# Patient Record
Sex: Female | Born: 1945 | Race: White | Hispanic: No | Smoking: Former smoker
Health system: Southern US, Community
[De-identification: ages and names within clinical notes are randomized; demographics above are authoritative.]

## PROBLEM LIST (undated history)

## (undated) DIAGNOSIS — K2270 Barrett's esophagus without dysplasia: Secondary | ICD-10-CM

## (undated) DIAGNOSIS — J45909 Unspecified asthma, uncomplicated: Secondary | ICD-10-CM

## (undated) DIAGNOSIS — F329 Major depressive disorder, single episode, unspecified: Secondary | ICD-10-CM

## (undated) DIAGNOSIS — C3491 Malignant neoplasm of unspecified part of right bronchus or lung: Secondary | ICD-10-CM

## (undated) DIAGNOSIS — F419 Anxiety disorder, unspecified: Secondary | ICD-10-CM

## (undated) DIAGNOSIS — Z923 Personal history of irradiation: Secondary | ICD-10-CM

## (undated) DIAGNOSIS — Z8711 Personal history of peptic ulcer disease: Secondary | ICD-10-CM

## (undated) DIAGNOSIS — F32A Depression, unspecified: Secondary | ICD-10-CM

## (undated) DIAGNOSIS — F410 Panic disorder [episodic paroxysmal anxiety] without agoraphobia: Secondary | ICD-10-CM

## (undated) DIAGNOSIS — E7800 Pure hypercholesterolemia, unspecified: Secondary | ICD-10-CM

## (undated) DIAGNOSIS — J449 Chronic obstructive pulmonary disease, unspecified: Secondary | ICD-10-CM

## (undated) DIAGNOSIS — R0602 Shortness of breath: Secondary | ICD-10-CM

## (undated) DIAGNOSIS — I714 Abdominal aortic aneurysm, without rupture: Secondary | ICD-10-CM

## (undated) DIAGNOSIS — Z87891 Personal history of nicotine dependence: Secondary | ICD-10-CM

## (undated) DIAGNOSIS — G4730 Sleep apnea, unspecified: Secondary | ICD-10-CM

## (undated) DIAGNOSIS — R7981 Abnormal blood-gas level: Secondary | ICD-10-CM

## (undated) DIAGNOSIS — M8580 Other specified disorders of bone density and structure, unspecified site: Secondary | ICD-10-CM

## (undated) DIAGNOSIS — K219 Gastro-esophageal reflux disease without esophagitis: Secondary | ICD-10-CM

## (undated) DIAGNOSIS — Z9981 Dependence on supplemental oxygen: Secondary | ICD-10-CM

## (undated) HISTORY — DX: Major depressive disorder, single episode, unspecified: F32.9

## (undated) HISTORY — DX: Panic disorder (episodic paroxysmal anxiety): F41.0

## (undated) HISTORY — DX: Anxiety disorder, unspecified: F41.9

## (undated) HISTORY — DX: Depression, unspecified: F32.A

## (undated) HISTORY — DX: Other specified disorders of bone density and structure, unspecified site: M85.80

## (undated) HISTORY — PX: TUBAL LIGATION: SHX77

## (undated) HISTORY — DX: Personal history of peptic ulcer disease: Z87.11

## (undated) HISTORY — DX: Unspecified asthma, uncomplicated: J45.909

## (undated) HISTORY — PX: UPPER GI ENDOSCOPY: SHX6162

## (undated) HISTORY — DX: Barrett's esophagus without dysplasia: K22.70

## (undated) HISTORY — DX: Sleep apnea, unspecified: G47.30

## (undated) HISTORY — DX: Personal history of nicotine dependence: Z87.891

## (undated) HISTORY — DX: Gastro-esophageal reflux disease without esophagitis: K21.9

## (undated) HISTORY — DX: Chronic obstructive pulmonary disease, unspecified: J44.9

## (undated) HISTORY — DX: Shortness of breath: R06.02

## (undated) HISTORY — PX: EYE SURGERY: SHX253

## (undated) HISTORY — PX: CHOLECYSTECTOMY: SHX55

## (undated) HISTORY — DX: Malignant neoplasm of unspecified part of right bronchus or lung: C34.91

---

## 1977-05-06 HISTORY — PX: TONSILLECTOMY: SUR1361

## 1988-05-06 HISTORY — PX: CERVICAL CONE BIOPSY: SUR198

## 2003-05-07 HISTORY — PX: FRACTURE SURGERY: SHX138

## 2005-02-01 ENCOUNTER — Ambulatory Visit: Payer: Self-pay | Admitting: Family Medicine

## 2005-06-11 ENCOUNTER — Ambulatory Visit: Payer: Self-pay

## 2006-07-23 ENCOUNTER — Ambulatory Visit: Payer: Self-pay

## 2006-09-11 ENCOUNTER — Ambulatory Visit: Payer: Self-pay | Admitting: Urology

## 2006-09-27 ENCOUNTER — Emergency Department: Payer: Self-pay

## 2006-10-08 ENCOUNTER — Ambulatory Visit: Payer: Self-pay | Admitting: Unknown Physician Specialty

## 2006-10-09 ENCOUNTER — Ambulatory Visit: Payer: Self-pay | Admitting: Unknown Physician Specialty

## 2007-04-20 ENCOUNTER — Ambulatory Visit: Payer: Self-pay | Admitting: Family Medicine

## 2007-05-05 ENCOUNTER — Ambulatory Visit: Payer: Self-pay | Admitting: Gastroenterology

## 2007-05-07 HISTORY — PX: ELBOW FRACTURE SURGERY: SHX616

## 2008-04-06 ENCOUNTER — Ambulatory Visit: Payer: Self-pay | Admitting: Family Medicine

## 2008-04-21 ENCOUNTER — Ambulatory Visit: Payer: Self-pay | Admitting: Family Medicine

## 2009-01-04 ENCOUNTER — Emergency Department: Payer: Self-pay | Admitting: Emergency Medicine

## 2009-12-26 ENCOUNTER — Ambulatory Visit: Payer: Self-pay | Admitting: Family Medicine

## 2010-11-19 ENCOUNTER — Ambulatory Visit: Payer: Self-pay | Admitting: Family Medicine

## 2011-04-08 ENCOUNTER — Ambulatory Visit: Payer: Self-pay | Admitting: Family Medicine

## 2011-07-17 ENCOUNTER — Ambulatory Visit: Payer: Self-pay | Admitting: Family Medicine

## 2011-07-17 DIAGNOSIS — I059 Rheumatic mitral valve disease, unspecified: Secondary | ICD-10-CM

## 2011-08-27 ENCOUNTER — Ambulatory Visit: Payer: Self-pay | Admitting: Family Medicine

## 2011-09-17 DIAGNOSIS — J449 Chronic obstructive pulmonary disease, unspecified: Secondary | ICD-10-CM | POA: Insufficient documentation

## 2012-04-30 ENCOUNTER — Ambulatory Visit: Payer: Self-pay | Admitting: Family Medicine

## 2012-05-06 HISTORY — PX: OTHER SURGICAL HISTORY: SHX169

## 2012-07-21 ENCOUNTER — Ambulatory Visit: Payer: Self-pay | Admitting: Gastroenterology

## 2012-07-21 HISTORY — PX: COLONOSCOPY: SHX174

## 2012-07-21 LAB — HM COLONOSCOPY

## 2013-05-06 DIAGNOSIS — C3491 Malignant neoplasm of unspecified part of right bronchus or lung: Secondary | ICD-10-CM

## 2013-05-06 HISTORY — DX: Malignant neoplasm of unspecified part of right bronchus or lung: C34.91

## 2013-05-18 ENCOUNTER — Ambulatory Visit: Payer: Self-pay | Admitting: Family Medicine

## 2013-08-31 ENCOUNTER — Other Ambulatory Visit (INDEPENDENT_AMBULATORY_CARE_PROVIDER_SITE_OTHER): Payer: Medicare Other

## 2013-08-31 ENCOUNTER — Encounter (INDEPENDENT_AMBULATORY_CARE_PROVIDER_SITE_OTHER): Payer: Self-pay

## 2013-08-31 ENCOUNTER — Ambulatory Visit (INDEPENDENT_AMBULATORY_CARE_PROVIDER_SITE_OTHER): Payer: Medicare Other | Admitting: Pulmonary Disease

## 2013-08-31 ENCOUNTER — Encounter: Payer: Self-pay | Admitting: Pulmonary Disease

## 2013-08-31 ENCOUNTER — Ambulatory Visit (INDEPENDENT_AMBULATORY_CARE_PROVIDER_SITE_OTHER)
Admission: RE | Admit: 2013-08-31 | Discharge: 2013-08-31 | Disposition: A | Discharge status: Home / Self Care | Payer: Medicare Other | Source: Ambulatory Visit | Attending: Pulmonary Disease | Admitting: Pulmonary Disease

## 2013-08-31 VITALS — BP 128/70 | HR 78 | Ht 71.0 in | Wt 222.0 lb

## 2013-08-31 DIAGNOSIS — J449 Chronic obstructive pulmonary disease, unspecified: Secondary | ICD-10-CM

## 2013-08-31 DIAGNOSIS — J441 Chronic obstructive pulmonary disease with (acute) exacerbation: Secondary | ICD-10-CM

## 2013-08-31 DIAGNOSIS — G4734 Idiopathic sleep related nonobstructive alveolar hypoventilation: Secondary | ICD-10-CM | POA: Insufficient documentation

## 2013-08-31 DIAGNOSIS — R0989 Other specified symptoms and signs involving the circulatory and respiratory systems: Secondary | ICD-10-CM

## 2013-08-31 DIAGNOSIS — F17200 Nicotine dependence, unspecified, uncomplicated: Secondary | ICD-10-CM | POA: Insufficient documentation

## 2013-08-31 DIAGNOSIS — H60399 Other infective otitis externa, unspecified ear: Secondary | ICD-10-CM

## 2013-08-31 DIAGNOSIS — R0609 Other forms of dyspnea: Secondary | ICD-10-CM

## 2013-08-31 DIAGNOSIS — R0600 Dyspnea, unspecified: Secondary | ICD-10-CM

## 2013-08-31 DIAGNOSIS — E161 Other hypoglycemia: Secondary | ICD-10-CM

## 2013-08-31 LAB — CBC
HCT: 42 % (ref 36.0–46.0)
HEMOGLOBIN: 14 g/dL (ref 12.0–15.0)
MCHC: 33.3 g/dL (ref 30.0–36.0)
MCV: 91.7 fl (ref 78.0–100.0)
PLATELETS: 270 10*3/uL (ref 150.0–400.0)
RBC: 4.58 Mil/uL (ref 3.87–5.11)
RDW: 14.8 % — ABNORMAL HIGH (ref 11.5–14.6)
WBC: 10.3 10*3/uL (ref 4.5–10.5)

## 2013-08-31 NOTE — Progress Notes (Signed)
Subjective:    Patient ID: Cathy Watts, female    DOB: [birth year], 68 y.o.   MRN: [id number]  HPI  This is a very pleasant 68 year old female who comes to my clinic today for evaluation of COPD. She was previously seen by Dr. Lawernce Ion at [facility] several years ago who told her that she had moderate COPD. At that time she was started on Spiriva and Advair. As a child she said she had asthma which never required hospitalization or frequent office visits. However, about 4 years ago when she was visiting the beach with friends she started feeling increasing shortness of breath when walking on the beach. It was at this time that she was diagnosed with COPD.  She currently has shortness of breath with pretty much any activity. Washing closthes, cleaning the house, or going for walks up stairs will make her short of breath. She does have a cough productive of clear sputum. She does not have environmental triggers which make her feel more short of breath. She does not have chest pain. She has not had leg swelling.  She continues to smoke one pack of cigarettes daily. She smoked about a pack a day for 50 years. She did quit for 7 months in 2012 but unfortunately started back and has not quit smoking since then. She recently was given a prescription for Chantix which she is thinking about taking.   Past Medical History  Diagnosis Date  . Anxiety   . Asthma   . SOB (shortness of breath)   . Osteopenia   . Depression   . Panic disorder   . History of peptic ulcer disease   . Sleep apnea      Family History  Problem Relation Age of Onset  . Cancer Mother     colon  . Cancer Father     stomach  . Cancer Brother     throat  . Cancer Sister     breast     History   Social History  . Marital Status: Married    Spouse Name: N/A    Number of Children: N/A  . Years of Education: N/A   Occupational History  . Not on file.   Social History Main Topics  .  Smoking status: Current Every Day Smoker -- 1.00 packs/day for 48 years    Types: Cigarettes  . Smokeless tobacco: Never Used     Comment: used to use E-cig  . Alcohol Use: No  . Drug Use: No  . Sexual Activity: Not on file   Other Topics Concern  . Not on file   Social History Narrative  . No narrative on file     No Known Allergies   No outpatient prescriptions prior to visit.   No facility-administered medications prior to visit.      Review of Systems  Constitutional: Negative for fever and unexpected weight change.  HENT: Positive for congestion. Negative for dental problem, ear pain, nosebleeds, postnasal drip, rhinorrhea, sinus pressure, sneezing, sore throat and trouble swallowing.   Eyes: Negative for redness and itching.  Respiratory: Positive for cough, shortness of breath and wheezing. Negative for chest tightness.   Cardiovascular: Negative for palpitations and leg swelling.  Gastrointestinal: Negative for nausea and vomiting.  Genitourinary: Negative for dysuria.  Musculoskeletal: Negative for joint swelling.  Skin: Negative for rash.  Neurological: Negative for headaches.  Hematological: Does not bruise/bleed easily.  Psychiatric/Behavioral: Negative for dysphoric mood. The patient is  not nervous/anxious.        Objective:   Physical Exam  Filed Vitals:   08/31/13 1531  BP: 128/70  Pulse: 78  Height: 5\' 11"  (1.803 m)  Weight: 222 lb (100.699 kg)  SpO2: 96%  Ra  Gen: well appearing, no acute distress HEENT: NCAT, PERRL, EOMi, OP clear, neck supple without masses, left TM with some thickening versus possible fluid collection also with external air canal redness throughout. PULM: Exp wheezes noted bilaterally CV: RRR, no mgr, no JVD AB: BS+, soft, nontender, no hsm Ext: warm, no edema, no clubbing, no cyanosis Derm: no rash or skin breakdown Neuro: A&Ox4, CN II-XII intact, strength 5/5 in all 4 extremities       Assessment & Plan:   COPD,  moderate COPD: GOLD Grade C Combined recommendations from the [place], [employer] of Chest Physicians, Investment banker, corporate, European Respiratory Society (Qaseem A et al, Ann Intern Med. 2011;155(3):179) recommends tobacco cessation, pulmonary rehab (for symptomatic patients with an FEV1 < 50% predicted), supplemental oxygen (for patients with SaO2 <88% or paO2 <55), and appropriate bronchodilator therapy.  In regards to long acting bronchodilators, they recommend monotherapy (FEV1 60-80% with symptoms weak evidence, FEV1 with symptoms <60% strong evidence), or combination therapy (FEV1 <60% with symptoms, strong recommendation, moderate evidence).  One should also provide patients with annual immunizations and consider therapy for prevention of COPD exacerbations (ie. roflumilast or azithromycin) when appopriate.  -O2 therapy: Not indicated -Immunizations: Review next visit -Tobacco use: Educated at length to quit -Exercise: Pulmonary rehab referral [place] -Bronchodilator therapy: Trial of Anoro, hold Spiriva/Advair while on Anoro -Exacerbation prevention: Pulm rehab   Dyspnea I think she is profoundly deconditioned and so starting pulmonary rehabilitation would helpful. We will try a trial of Anoro to see if that makes a difference but I think that her medications for COPD her adequate right now.  We will check an H&H to make sure she's not anemic. If she does not have improvement shortness of breath after is pitting in pulmonary rehabilitation and we may want to consider a cardiac workup. Apparently she had a normal stress test recently at her doctor's office.  We will also check a chest x-ray.  Tobacco use disorder Educated at length to quit today. She is not quite ready to quit. She is interested in trying Chantix but she knows that it's not to work until she is determined to quit smoking.  Otitis, externa, infective She has what looks like a mild case of  otitis externa which she says is been present for a month. Because of the duration of her symptoms I think it would be wise for her to see [facility]. We will make a referral for that today.  Nocturnal hypoglycemia We will start 2 L nasal cannula each bedtime I agree with plans for a split night study    Updated Medication List Outpatient Encounter Prescriptions as of 08/31/2013  Medication Sig  . albuterol (PROVENTIL HFA;VENTOLIN HFA) 108 (90 BASE) MCG/ACT inhaler Inhale 2 puffs into the lungs every 6 (six) hours as needed for shortness of breath (or 30 minutes before exercize).  Marland Kitchen aspirin 81 MG tablet Take 81 mg by mouth daily.  . calcium carbonate (OS-CAL) 600 MG TABS tablet Take 600 mg by mouth 2 (two) times daily with a meal.  . citalopram (CELEXA) 40 MG tablet Take 40 mg by mouth daily.  . Fluticasone-Salmeterol (ADVAIR) 250-50 MCG/DOSE AEPB Inhale 1 puff into the  lungs 2 (two) times daily.  . Omega-3 Fatty Acids (FISH OIL) 1200 MG CAPS Take 1 capsule by mouth daily.  Marland Kitchen omeprazole (PRILOSEC) 20 MG capsule Take 20 mg by mouth daily.  . pravastatin (PRAVACHOL) 20 MG tablet Take 20 mg by mouth daily.  Marland Kitchen tiotropium (SPIRIVA) 18 MCG inhalation capsule Place 18 mcg into inhaler and inhale daily.

## 2013-08-31 NOTE — Assessment & Plan Note (Addendum)
We will start 2 L nasal cannula each bedtime I agree with plans for a split night study

## 2013-08-31 NOTE — Patient Instructions (Addendum)
QUIT SMOKING! Stop Spiriva and Advair Use the Anoro for two weeks and then call us to let us know what you think, we can prescribe it if it makes you feel better We will refer you to pulmonary rehab at [facility] We will request the records from your visit with Dr. Corrin Parker We will call you with the results from the Chest X-ray and blood from today We will refer you to [facility] for the left ear redness and itchiness We will see you back in 2-3 months or sooner if needed

## 2013-08-31 NOTE — Assessment & Plan Note (Signed)
She has what looks like a mild case of otitis externa which she says is been present for a month. Because of the duration of her symptoms I think it would be wise for her to see [facility]. We will make a referral for that today.

## 2013-08-31 NOTE — Assessment & Plan Note (Signed)
Educated at length to quit today. She is not quite ready to quit. She is interested in trying Chantix but she knows that it's not to work until she is determined to quit smoking.

## 2013-08-31 NOTE — Assessment & Plan Note (Addendum)
I think she is profoundly deconditioned and so starting pulmonary rehabilitation would helpful. We will try a trial of Anoro to see if that makes a difference but I think that her medications for COPD her adequate right now.  We will check an H&H to make sure she's not anemic. If she does not have improvement shortness of breath after is pitting in pulmonary rehabilitation and we may want to consider a cardiac workup. Apparently she had a normal stress test recently at her doctor's office.  We will also check a chest x-ray.

## 2013-08-31 NOTE — Assessment & Plan Note (Signed)
COPD: GOLD Grade C Combined recommendations from the [employer], [employer], Investment banker, corporate, [place] (Qaseem A et al, Ann Intern Med. 2011;155(3):179) recommends tobacco cessation, pulmonary rehab (for symptomatic patients with an FEV1 < 50% predicted), supplemental oxygen (for patients with SaO2 <88% or paO2 <55), and appropriate bronchodilator therapy.  In regards to long acting bronchodilators, they recommend monotherapy (FEV1 60-80% with symptoms weak evidence, FEV1 with symptoms <60% strong evidence), or combination therapy (FEV1 <60% with symptoms, strong recommendation, moderate evidence).  One should also provide patients with annual immunizations and consider therapy for prevention of COPD exacerbations (ie. roflumilast or azithromycin) when appopriate.  -O2 therapy: Not indicated -Immunizations: Review next visit -Tobacco use: Educated at length to quit -Exercise: Pulmonary rehab referral [place] -Bronchodilator therapy: Trial of Anoro, hold Spiriva/Advair while on Anoro -Exacerbation prevention: Pulm rehab

## 2013-09-02 NOTE — Progress Notes (Signed)
Quick Note:  Spoke with pt's spouse, he will have her cb when she returns home. ______

## 2013-09-09 NOTE — Progress Notes (Signed)
Quick Note:  Spoke with pt, she is aware of results. Nothing further needed at this time. ______

## 2013-09-21 ENCOUNTER — Telehealth: Payer: Self-pay | Admitting: Pulmonary Disease

## 2013-09-21 NOTE — Telephone Encounter (Signed)
[id number] x1 

## 2013-09-22 MED ORDER — UMECLIDINIUM-VILANTEROL 62.5-25 MCG/INH IN AEPB: 1.0000 | INHALATION_SPRAY | Freq: Every day | RESPIRATORY_TRACT | Status: DC

## 2013-09-22 NOTE — Telephone Encounter (Signed)
Per last OV: Use the Anoro for two weeks and then call us to let us know what you think, we can prescribe it if it makes you feel better  I spoke with the pt and she states anoro is working well for her and would like an rx sent to pharmacy. Rx sent. [place] Bing, CMA

## 2013-10-12 ENCOUNTER — Ambulatory Visit: Payer: Self-pay | Admitting: Gastroenterology

## 2013-10-14 LAB — PATHOLOGY REPORT

## 2014-04-19 ENCOUNTER — Telehealth: Payer: Self-pay | Admitting: Pulmonary Disease

## 2014-04-19 MED ORDER — UMECLIDINIUM-VILANTEROL 62.5-25 MCG/INH IN AEPB: 1.0000 | INHALATION_SPRAY | Freq: Every day | RESPIRATORY_TRACT | Status: DC

## 2014-04-19 NOTE — Telephone Encounter (Signed)
Pt aware that Rx for Anoro has been sent to pharmacy [facility] Appt scheduled for OV with BQ for 05/11/14. Nothing further needed.

## 2014-05-11 ENCOUNTER — Encounter: Payer: Self-pay | Admitting: Pulmonary Disease

## 2014-05-11 ENCOUNTER — Ambulatory Visit (INDEPENDENT_AMBULATORY_CARE_PROVIDER_SITE_OTHER): Payer: Medicare Other | Admitting: Pulmonary Disease

## 2014-05-11 VITALS — BP 132/70 | HR 81 | Ht 71.0 in | Wt 209.0 lb

## 2014-05-11 DIAGNOSIS — Z720 Tobacco use: Secondary | ICD-10-CM

## 2014-05-11 DIAGNOSIS — J449 Chronic obstructive pulmonary disease, unspecified: Secondary | ICD-10-CM

## 2014-05-11 DIAGNOSIS — F17200 Nicotine dependence, unspecified, uncomplicated: Secondary | ICD-10-CM

## 2014-05-11 NOTE — Assessment & Plan Note (Signed)
Educated at length to quit, considered various treatment modalities. She currently does not request medications to help with quitting.  We discussed the risks and benefits of undergoing lung cancer screening including the one in 4 chance that there will be an abnormality on her CTs scan. She is willing to proceed.  Plan: -Quit smoking -Screening CT scan of the lungs

## 2014-05-11 NOTE — Progress Notes (Signed)
Subjective:    Patient ID: Cathy Watts, female    DOB: Nov 28, 1945, 69 y.o.   MRN: [id number]  Synopsis: Gold Grade B with moderate airflow obstruction 2015, still smoking as of 05/2014  HPI  Chief Complaint  Patient presents with  . Follow-up    pt c/o cough with sl. wheezing, she denies chest tightness. Pt does have some nasal drainage. She has started back smoking daily. She did not complete Pulm Rehab.   Cathy Watts missed her follow up appointment because he rhusband has been so sick lately.  She has to take him to hemodialysis MWF and so this limis her ability to take care of herself.  She thinks that she has been doing OK during this time despite all this.  She says that carrying heavy objects is hard, but otherwise she has been doing OK.  She continues to take Anoro, no flares of bronchitis.    Past Medical History  Diagnosis Date  . Anxiety   . Asthma   . SOB (shortness of breath)   . Osteopenia   . Depression   . Panic disorder   . History of peptic ulcer disease   . Sleep apnea      Review of Systems     Objective:   Physical Exam Filed Vitals:   05/11/14 1528  BP: 132/70  Pulse: 81  Height: 5\' 11"  (1.803 m)  Weight: 209 lb (94.802 kg)  SpO2: 94%  RA  Gen: well appearing, no acute distress HEENT: NCAT,  EOMi, OP clear, PULM: CTA B CV: RRR, no mgr, no JVD AB: BS+, soft, nontender, Ext: warm, no edema, no clubbing, no cyanosis Derm: no rash or skin breakdown Neuro: A&Ox4, MAEW         Assessment & Plan:   COPD, moderate Despite starting smoking again, this is been a stable interval for Cathy Watts. She is quite busy taking care of her husband at home so she has been unable to pertussis a patent pulmonary rehabilitation. I explained to her today that she needs to do her best to quit smoking because of her COPD and she needs to make some effort to take care of herself with exercise.  Her flu shot is up-to-date  Plan: -Continue Anoro -I encouraged  her to exercise regularly at home -Quit smoking -Follow-up one year get a flu shot next year as well  Tobacco use disorder Educated at length to quit, considered various treatment modalities. She currently does not request medications to help with quitting.  We discussed the risks and benefits of undergoing lung cancer screening including the one in 4 chance that there will be an abnormality on her CTs scan. She is willing to proceed.  Plan: -Quit smoking -Screening CT scan of the lungs    Updated Medication List Outpatient Encounter Prescriptions as of 05/11/2014  Medication Sig  . albuterol (PROVENTIL HFA;VENTOLIN HFA) 108 (90 BASE) MCG/ACT inhaler Inhale 2 puffs into the lungs every 6 (six) hours as needed for shortness of breath (or 30 minutes before exercize).  Marland Kitchen aspirin 81 MG tablet Take 81 mg by mouth daily.  . calcium carbonate (OS-CAL) 600 MG TABS tablet Take 600 mg by mouth daily with breakfast.   . citalopram (CELEXA) 40 MG tablet Take 20 mg by mouth daily.   . Omega-3 Fatty Acids (FISH OIL) 1200 MG CAPS Take 1 capsule by mouth daily.  Marland Kitchen omeprazole (PRILOSEC) 20 MG capsule Take 20 mg by mouth daily.  . polyethylene glycol powder (  GLYCOLAX/MIRALAX) powder Take 17 g by mouth daily.  . pravastatin (PRAVACHOL) 20 MG tablet Take 20 mg by mouth daily.  Marland Kitchen Umeclidinium-Vilanterol (ANORO ELLIPTA) 62.5-25 MCG/INH AEPB Inhale 1 puff into the lungs daily.

## 2014-05-11 NOTE — Assessment & Plan Note (Signed)
Despite starting smoking again, this is been a stable interval for Cathy Watts. She is quite busy taking care of her husband at home so she has been unable to pertussis a patent pulmonary rehabilitation. I explained to her today that she needs to do her best to quit smoking because of her COPD and she needs to make some effort to take care of herself with exercise.  Her flu shot is up-to-date  Plan: -Continue Anoro -I encouraged her to exercise regularly at home -Quit smoking -Follow-up one year get a flu shot next year as well

## 2014-05-11 NOTE — Patient Instructions (Signed)
We will set up the lung cancer screening CT scan Keep taking the Anoro Quit smoking Exercise regularly  We will see you back in one year or sooner if needed

## 2014-06-02 ENCOUNTER — Ambulatory Visit: Payer: Self-pay | Admitting: Family Medicine

## 2014-07-07 ENCOUNTER — Ambulatory Visit
Admit: 2014-07-07 | Disposition: A | Discharge status: Home / Self Care | Payer: Self-pay | Attending: Family Medicine | Admitting: Family Medicine

## 2014-07-08 ENCOUNTER — Ambulatory Visit: Payer: Self-pay | Admitting: Family Medicine

## 2014-07-12 ENCOUNTER — Telehealth: Payer: Self-pay | Admitting: Pulmonary Disease

## 2014-07-12 MED ORDER — LEVOFLOXACIN 500 MG PO TABS: 500.0000 mg | ORAL_TABLET | Freq: Every day | ORAL | Status: AC

## 2014-07-12 NOTE — Telephone Encounter (Signed)
I called Cathy Watts to let her know that the screening CT scan showed right lower lobe pneumonia. She says she has been coughing with some mild dyspnea recently. She denies fevers or chills and she says that she feels like she's improving somewhat.  I prescribed Levaquin 500 mg by mouth daily 5 days and asked that she pick this up. She voiced understanding.  We will repeat a screening CT chest in 3 months.

## 2014-07-12 NOTE — Telephone Encounter (Signed)
Spoke with Magda Paganini at [facility] CT, she wanted to make sure that BQ looked at pt's lung ca screening ct.  I advised her that I would have BQ review this today.  Nothing further needed at this time.

## 2014-07-12 NOTE — Addendum Note (Signed)
Addended by: Simonne Maffucci B on: 07/12/2014 03:21 PM   Modules accepted: Orders

## 2014-08-02 ENCOUNTER — Other Ambulatory Visit: Payer: Self-pay

## 2014-08-02 ENCOUNTER — Telehealth: Payer: Self-pay | Admitting: Pulmonary Disease

## 2014-08-02 MED ORDER — UMECLIDINIUM-VILANTEROL 62.5-25 MCG/INH IN AEPB: 1.0000 | INHALATION_SPRAY | Freq: Every day | RESPIRATORY_TRACT | Status: DC

## 2014-08-02 NOTE — Telephone Encounter (Signed)
Pt is aware that her prescription has already been sent in. Nothing further was needed.

## 2014-08-08 DIAGNOSIS — J449 Chronic obstructive pulmonary disease, unspecified: Secondary | ICD-10-CM | POA: Diagnosis not present

## 2014-08-17 DIAGNOSIS — F329 Major depressive disorder, single episode, unspecified: Secondary | ICD-10-CM | POA: Diagnosis not present

## 2014-08-17 DIAGNOSIS — F419 Anxiety disorder, unspecified: Secondary | ICD-10-CM | POA: Diagnosis not present

## 2014-08-17 DIAGNOSIS — J449 Chronic obstructive pulmonary disease, unspecified: Secondary | ICD-10-CM | POA: Diagnosis not present

## 2014-09-07 DIAGNOSIS — J449 Chronic obstructive pulmonary disease, unspecified: Secondary | ICD-10-CM | POA: Diagnosis not present

## 2014-09-27 ENCOUNTER — Ambulatory Visit
Admission: RE | Admit: 2014-09-27 | Discharge: 2014-09-27 | Disposition: A | Discharge status: Home / Self Care | Payer: Medicare Other | Source: Ambulatory Visit | Attending: Family Medicine | Admitting: Family Medicine

## 2014-09-27 ENCOUNTER — Other Ambulatory Visit: Payer: Self-pay | Admitting: Family Medicine

## 2014-09-27 DIAGNOSIS — J449 Chronic obstructive pulmonary disease, unspecified: Secondary | ICD-10-CM | POA: Diagnosis not present

## 2014-09-27 DIAGNOSIS — J189 Pneumonia, unspecified organism: Secondary | ICD-10-CM | POA: Diagnosis not present

## 2014-09-27 DIAGNOSIS — F419 Anxiety disorder, unspecified: Secondary | ICD-10-CM | POA: Diagnosis not present

## 2014-09-27 DIAGNOSIS — R05 Cough: Secondary | ICD-10-CM | POA: Diagnosis not present

## 2014-10-19 ENCOUNTER — Other Ambulatory Visit: Payer: Self-pay | Admitting: Family Medicine

## 2014-10-19 DIAGNOSIS — K219 Gastro-esophageal reflux disease without esophagitis: Secondary | ICD-10-CM | POA: Insufficient documentation

## 2014-10-19 DIAGNOSIS — K2270 Barrett's esophagus without dysplasia: Secondary | ICD-10-CM | POA: Insufficient documentation

## 2014-10-19 DIAGNOSIS — E7800 Pure hypercholesterolemia, unspecified: Secondary | ICD-10-CM | POA: Insufficient documentation

## 2014-10-19 MED ORDER — PRAVASTATIN SODIUM 20 MG PO TABS: 20.0000 mg | ORAL_TABLET | Freq: Every day | ORAL | Status: DC

## 2014-10-19 NOTE — Telephone Encounter (Signed)
Last refill 07/21/2014. LOV 09/27/2014. Last lipids checked 04/12/2014. Renaldo Fiddler, CMA

## 2014-10-19 NOTE — Telephone Encounter (Signed)
Pt contacted office for refill request on the following medications:  pravastatin (PRAVACHOL) 20 MG tablet.  [place].  OE#[phone]/[id number]

## 2014-11-15 DIAGNOSIS — Z8711 Personal history of peptic ulcer disease: Secondary | ICD-10-CM | POA: Insufficient documentation

## 2014-11-15 DIAGNOSIS — R0683 Snoring: Secondary | ICD-10-CM | POA: Insufficient documentation

## 2014-11-15 DIAGNOSIS — R609 Edema, unspecified: Secondary | ICD-10-CM | POA: Insufficient documentation

## 2014-11-15 DIAGNOSIS — R0602 Shortness of breath: Secondary | ICD-10-CM | POA: Insufficient documentation

## 2014-11-15 DIAGNOSIS — K5790 Diverticulosis of intestine, part unspecified, without perforation or abscess without bleeding: Secondary | ICD-10-CM | POA: Insufficient documentation

## 2014-11-15 DIAGNOSIS — F410 Panic disorder [episodic paroxysmal anxiety] without agoraphobia: Secondary | ICD-10-CM | POA: Insufficient documentation

## 2014-11-15 DIAGNOSIS — F419 Anxiety disorder, unspecified: Secondary | ICD-10-CM | POA: Insufficient documentation

## 2014-11-15 DIAGNOSIS — J441 Chronic obstructive pulmonary disease with (acute) exacerbation: Secondary | ICD-10-CM | POA: Insufficient documentation

## 2014-11-15 DIAGNOSIS — M8580 Other specified disorders of bone density and structure, unspecified site: Secondary | ICD-10-CM | POA: Insufficient documentation

## 2014-11-15 DIAGNOSIS — J45909 Unspecified asthma, uncomplicated: Secondary | ICD-10-CM | POA: Insufficient documentation

## 2014-11-15 DIAGNOSIS — F17200 Nicotine dependence, unspecified, uncomplicated: Secondary | ICD-10-CM | POA: Insufficient documentation

## 2014-11-15 DIAGNOSIS — E559 Vitamin D deficiency, unspecified: Secondary | ICD-10-CM | POA: Insufficient documentation

## 2014-11-15 DIAGNOSIS — R002 Palpitations: Secondary | ICD-10-CM | POA: Insufficient documentation

## 2014-11-15 DIAGNOSIS — F32A Depression, unspecified: Secondary | ICD-10-CM | POA: Insufficient documentation

## 2014-11-15 DIAGNOSIS — G4730 Sleep apnea, unspecified: Secondary | ICD-10-CM | POA: Insufficient documentation

## 2014-11-15 DIAGNOSIS — F329 Major depressive disorder, single episode, unspecified: Secondary | ICD-10-CM | POA: Insufficient documentation

## 2014-11-15 DIAGNOSIS — R400 Somnolence: Secondary | ICD-10-CM | POA: Insufficient documentation

## 2014-11-15 DIAGNOSIS — K5900 Constipation, unspecified: Secondary | ICD-10-CM | POA: Insufficient documentation

## 2014-11-16 ENCOUNTER — Encounter: Payer: Self-pay | Admitting: Physician Assistant

## 2014-11-16 ENCOUNTER — Ambulatory Visit
Admission: RE | Admit: 2014-11-16 | Discharge: 2014-11-16 | Disposition: A | Discharge status: Home / Self Care | Payer: Medicare Other | Source: Ambulatory Visit | Attending: Physician Assistant | Admitting: Physician Assistant

## 2014-11-16 ENCOUNTER — Ambulatory Visit (INDEPENDENT_AMBULATORY_CARE_PROVIDER_SITE_OTHER): Payer: Medicare Other | Admitting: Physician Assistant

## 2014-11-16 VITALS — BP 118/70 | HR 76 | Resp 16 | Wt 190.8 lb

## 2014-11-16 DIAGNOSIS — R0989 Other specified symptoms and signs involving the circulatory and respiratory systems: Secondary | ICD-10-CM | POA: Diagnosis not present

## 2014-11-16 DIAGNOSIS — J441 Chronic obstructive pulmonary disease with (acute) exacerbation: Secondary | ICD-10-CM

## 2014-11-16 DIAGNOSIS — J449 Chronic obstructive pulmonary disease, unspecified: Secondary | ICD-10-CM | POA: Insufficient documentation

## 2014-11-16 DIAGNOSIS — R05 Cough: Secondary | ICD-10-CM | POA: Diagnosis present

## 2014-11-16 MED ORDER — LEVOFLOXACIN 500 MG PO TABS: 500.0000 mg | ORAL_TABLET | Freq: Every day | ORAL | Status: DC

## 2014-11-16 NOTE — Progress Notes (Signed)
   Subjective:    Patient ID: Cathy Watts, female    DOB: [birth year], 69 y.o.   MRN: [id number]  Cough This is a chronic problem. The current episode started in the past 7 days. The problem has been gradually improving. The problem occurs hourly. The cough is productive of purulent sputum (thick). Associated symptoms include nasal congestion, postnasal drip, rhinorrhea, shortness of breath (at baseline), sweats (once) and wheezing (at baseline). Pertinent negatives include no chest pain, chills, ear congestion, ear pain, fever, headaches, heartburn, hemoptysis, myalgias, rash, sore throat or weight loss. The symptoms are aggravated by lying down (humid air). Risk factors for lung disease include smoking/tobacco exposure. She has tried steroid inhaler, prescription cough suppressant, rest and ipratropium inhaler for the symptoms. The treatment provided moderate relief. Her past medical history is significant for asthma, bronchitis, COPD, emphysema and pneumonia. There is no history of bronchiectasis or environmental allergies.      Review of Systems  Constitutional: Negative for fever, chills and weight loss.  HENT: Positive for postnasal drip and rhinorrhea. Negative for ear pain and sore throat.   Respiratory: Positive for cough, shortness of breath (at baseline) and wheezing (at baseline). Negative for hemoptysis and chest tightness.   Cardiovascular: Negative for chest pain, palpitations and leg swelling.  Gastrointestinal: Negative for heartburn.  Musculoskeletal: Negative for myalgias.  Skin: Negative for rash.  Allergic/Immunologic: Negative for environmental allergies.  Neurological: Negative for dizziness, weakness and headaches.       Objective:   Physical Exam  Constitutional: She appears well-developed and well-nourished. No distress.  HENT:  Head: Normocephalic and atraumatic.  Right Ear: Hearing, tympanic membrane, external ear and ear canal normal.  Left Ear:  Hearing, tympanic membrane, external ear and ear canal normal.  Nose: Nose normal.  Mouth/Throat: Uvula is midline, oropharynx is clear and moist and mucous membranes are normal. No oropharyngeal exudate.  Cardiovascular: Normal rate, regular rhythm and normal heart sounds.  Exam reveals no gallop and no friction rub.   No murmur heard. Pulmonary/Chest: Effort normal. No respiratory distress. She has no decreased breath sounds. She has wheezes (right and left mid and lower lung fields). She has no rhonchi. She has rales in the right lower field. She exhibits no tenderness.  Lymphadenopathy:    She has no cervical adenopathy.  Skin: She is not diaphoretic.  Vitals reviewed.         Assessment & Plan:  1. COPD, moderate Will obtain CXR to make sure crackles in RLL is not new pneumonia.  Previously had pneumonia in 07/2014 seen on chest CT.  Advised to call Dr. Anastasia Pall office as she was to have a f/u CT in 3 months, which would have been June 2016, and she has not yet been scheduled for this. - DG Chest 2 View; Future  2. Acute exacerbation of chronic obstructive airways disease States she is improving.  Last week was worst.  Due to abnormal lung sounds in RLL will treat with levaquin as below.  CXR ordered.  She is to call if symptoms do not improve or worsen. - levofloxacin (LEVAQUIN) 500 MG tablet; Take 1 tablet (500 mg total) by mouth daily.  Dispense: 10 tablet; Refill: 0 - DG Chest 2 View; Future

## 2014-11-16 NOTE — Patient Instructions (Signed)

## 2014-11-17 ENCOUNTER — Telehealth: Payer: Self-pay

## 2014-11-17 NOTE — Telephone Encounter (Signed)
LMTCB

## 2014-11-17 NOTE — Telephone Encounter (Signed)
Patient advised as directed below. Patient verbalized understanding.  

## 2014-11-17 NOTE — Telephone Encounter (Signed)
-----   Message from Mar Daring, [place] sent at 11/16/2014  5:30 PM EDT ----- CXR shows chronic COPD but no active disease.

## 2015-01-06 ENCOUNTER — Telehealth: Payer: Self-pay

## 2015-01-06 NOTE — Telephone Encounter (Signed)
Navigator Encounter Type: telephone, Screening  Notified patient that annual lung cancer screening low dose CT scan is due. Confirmed that patient is within the age range 55-77, asymptomatic. The patient is a smoker with a 51 pkyear history. Shared Decision Making Visit was done 07/08/2014. Patient is agreeable to scheduling of CT scan.

## 2015-01-16 ENCOUNTER — Other Ambulatory Visit: Payer: Self-pay | Admitting: Family Medicine

## 2015-01-16 ENCOUNTER — Encounter: Payer: Self-pay | Admitting: Family Medicine

## 2015-01-16 DIAGNOSIS — Z87891 Personal history of nicotine dependence: Secondary | ICD-10-CM

## 2015-01-16 HISTORY — DX: Personal history of nicotine dependence: Z87.891

## 2015-01-17 ENCOUNTER — Other Ambulatory Visit: Payer: Self-pay | Admitting: Family Medicine

## 2015-01-17 DIAGNOSIS — R918 Other nonspecific abnormal finding of lung field: Secondary | ICD-10-CM

## 2015-01-18 ENCOUNTER — Ambulatory Visit
Admission: RE | Admit: 2015-01-18 | Discharge: 2015-01-18 | Disposition: A | Discharge status: Home / Self Care | Payer: Medicare Other | Source: Ambulatory Visit | Attending: Family Medicine | Admitting: Family Medicine

## 2015-01-18 DIAGNOSIS — J189 Pneumonia, unspecified organism: Secondary | ICD-10-CM | POA: Diagnosis not present

## 2015-01-18 DIAGNOSIS — J219 Acute bronchiolitis, unspecified: Secondary | ICD-10-CM | POA: Insufficient documentation

## 2015-01-18 DIAGNOSIS — R918 Other nonspecific abnormal finding of lung field: Secondary | ICD-10-CM | POA: Insufficient documentation

## 2015-01-19 ENCOUNTER — Other Ambulatory Visit: Payer: Self-pay | Admitting: Family Medicine

## 2015-01-19 DIAGNOSIS — R918 Other nonspecific abnormal finding of lung field: Secondary | ICD-10-CM

## 2015-01-19 DIAGNOSIS — Z87891 Personal history of nicotine dependence: Secondary | ICD-10-CM

## 2015-01-20 ENCOUNTER — Ambulatory Visit: Payer: Medicare Other | Admitting: Family Medicine

## 2015-01-27 ENCOUNTER — Ambulatory Visit
Admission: RE | Admit: 2015-01-27 | Discharge: 2015-01-27 | Disposition: A | Discharge status: Home / Self Care | Payer: Medicare Other | Source: Ambulatory Visit | Attending: Family Medicine | Admitting: Family Medicine

## 2015-01-27 DIAGNOSIS — I2510 Atherosclerotic heart disease of native coronary artery without angina pectoris: Secondary | ICD-10-CM | POA: Diagnosis not present

## 2015-01-27 DIAGNOSIS — R590 Localized enlarged lymph nodes: Secondary | ICD-10-CM | POA: Insufficient documentation

## 2015-01-27 DIAGNOSIS — R911 Solitary pulmonary nodule: Secondary | ICD-10-CM | POA: Diagnosis not present

## 2015-01-27 DIAGNOSIS — J219 Acute bronchiolitis, unspecified: Secondary | ICD-10-CM | POA: Diagnosis not present

## 2015-01-27 DIAGNOSIS — R918 Other nonspecific abnormal finding of lung field: Secondary | ICD-10-CM | POA: Diagnosis present

## 2015-01-27 DIAGNOSIS — J439 Emphysema, unspecified: Secondary | ICD-10-CM | POA: Insufficient documentation

## 2015-01-27 MED ORDER — IOHEXOL 300 MG/ML  SOLN
75.0000 mL | Freq: Once | INTRAMUSCULAR | Status: AC | PRN
  Administered 2015-01-27: 75 mL via INTRAVENOUS

## 2015-01-30 ENCOUNTER — Encounter: Payer: Self-pay | Admitting: *Deleted

## 2015-01-30 ENCOUNTER — Encounter: Payer: Self-pay | Admitting: Cardiothoracic Surgery

## 2015-01-30 ENCOUNTER — Inpatient Hospital Stay: Payer: Medicare Other | Attending: Cardiothoracic Surgery | Admitting: Cardiothoracic Surgery

## 2015-01-30 VITALS — BP 146/87 | HR 60 | Temp 96.8°F | Resp 19 | Ht 71.0 in | Wt 181.5 lb

## 2015-01-30 DIAGNOSIS — R918 Other nonspecific abnormal finding of lung field: Secondary | ICD-10-CM | POA: Diagnosis not present

## 2015-01-30 NOTE — Progress Notes (Signed)
  Oncology Nurse Navigator Documentation    Navigator Encounter Type: Clinic/MDC (01/30/15 1100) Patient Visit Type: Surgery (01/30/15 1100) Treatment Phase: Abnormal Scans (01/30/15 1100)                  Time Spent with Patient: 30 (01/30/15 1100)   Met with patient at thoracic surgery appointment. Introduced Programmer, multimedia and reviewed plan of care. Will follow and assist with coordination of obtaining pfts and ebus. Patient verbalizes preference for having appointments here in [place] if possible.

## 2015-01-30 NOTE — Progress Notes (Signed)
Patient ID: Cathy Watts, female   DOB: 09-Jun-1945, 69 y.o.   MRN: [id number]  Chief Complaint  Patient presents with  . New Evaluation    CT scan results    Referred By Dr. Venia Minks Reason for Referral right hilar mass  HPI Location, Quality, Duration, Severity, Timing, Context, Modifying Factors, Associated Signs and Symptoms.  Cathy Watts is a 69 y.o. female.  I have personally seen and examined this patient. She is a 69 year old white female who was originally diagnosed with COPD several years ago. She is currently treated with oxygen therapy at night. She had an episode of pneumonia in March of this year and a CT scan showed a right lower lobe pneumonia. 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 She then had a CT scan with contrast again revealing a right lower lobe mass with postobstructive pneumonitis. There is also some hilar adenopathy. The patient is a lifelong smoker. She smokes at least a pack cigarettes a day and has done so almost all her life. She states that she does get short of breath. She is also had a cough but no hemoptysis. She's had about a 50 pound weight loss which has been intentional.   Past Medical History  Diagnosis Date  . Anxiety   . Asthma   . SOB (shortness of breath)   . Osteopenia   . Depression   . Panic disorder   . History of peptic ulcer disease   . Sleep apnea   . Personal history of tobacco use, presenting hazards to health 01/16/2015    Past Surgical History  Procedure Laterality Date  . Tubal ligation  1970's  . Cholecystectomy  1980's  . Elbow fracture surgery Right 2009  . Tonsillectomy  1979  . Cervical cone biopsy  1990    Family History  Problem Relation Age of Onset  . Cancer Mother     colon  . Diabetes Mother   . Arthritis Mother   . Glaucoma Mother   . Cancer Father     stomach  . Cancer Brother     throat   . Cancer Sister     breast  . Diabetes Sister   . Cirrhosis Sister   . Cancer Paternal Aunt   . Parkinson's disease Sister   . Heart attack Sister   . Ovarian cancer Sister     Social History Social History  Substance Use Topics  . Smoking status: Current Every Day Smoker -- 1.00 packs/day for 48 years    Types: Cigarettes  . Smokeless tobacco: Never Used     Comment: used to use E-cig  . Alcohol Use: No    No Known Allergies  Current Outpatient Prescriptions  Medication Sig Dispense Refill  . albuterol (PROVENTIL HFA;VENTOLIN HFA) 108 (90 BASE) MCG/ACT inhaler Inhale 2 puffs into the lungs every 6 (six) hours as needed for shortness of breath (or 30 minutes before exercize).    Marland Kitchen ALPRAZolam (XANAX) 0.5 MG tablet Take 1 tablet by mouth every 8 (eight) hours as needed.    Marland Kitchen aspirin 81 MG tablet Take 81 mg by mouth daily.    . busPIRone (BUSPAR) 30 MG tablet Take 1 tablet by mouth 2 (two) times daily.    Marland Kitchen CALCIUM CARBONATE-VIT D-MIN PO Take 1 tablet by mouth 3 (three) times daily.    . clotrimazole-betamethasone (LOTRISONE) cream LOTRISONE, 1-0.05% (External Cream)  1 (one) Cream Cream  Apply twice daily to affected area.  Not for Internal use.  (Correction from prev script said not for external use and should read , not for internal use) for 0 days  Quantity: 45;  Refills: 1   Ordered :22-Apr-2013  Renaldo Fiddler ;  Started 22-Apr-2013 Active    . escitalopram (LEXAPRO) 20 MG tablet Take 1 tablet by mouth daily.    . Omega-3 Fatty Acids (FISH OIL) 1200 MG CAPS Take 1 capsule by mouth daily.    Marland Kitchen omeprazole (PRILOSEC) 20 MG capsule Take 20 mg by mouth daily.    . polyethylene glycol powder (GLYCOLAX/MIRALAX) powder Take 17 g by mouth daily.    . pravastatin (PRAVACHOL) 20 MG tablet Take 1 tablet (20 mg total) by mouth at bedtime. 90 tablet 3  . Umeclidinium-Vilanterol (ANORO ELLIPTA) 62.5-25 MCG/INH AEPB Inhale 1 puff into the lungs daily. 60 each 6   No current  facility-administered medications for this visit.      Review of Systems A complete review of systems was asked and was negative except for the following positive findings weight loss, cough, shortness of breath, wheezing, heartburn, frequent urination, depression, anxiety, excessive thirst.  Blood pressure 146/87, pulse 60, temperature 96.8 F (36 C), temperature source Tympanic, resp. rate 19, height '5\' 11"'$  (1.803 m), weight 181 lb 8.8 oz (82.35 kg), SpO2 99 %.  Physical Exam CONSTITUTIONAL:  Pleasant, well-developed, well-nourished, and in no acute distress. EYES: Pupils equal and reactive to light, Sclera non-icteric EARS, NOSE, MOUTH AND THROAT:  The oropharynx was clear.  Dentition is good repair.  Oral mucosa pink and moist. LYMPH NODES:  Lymph nodes in the neck and axillae were normal RESPIRATORY:  Lungs were clear.  Normal respiratory effort without pathologic use of accessory muscles of respiration CARDIOVASCULAR: Heart was regular without murmurs.  There were no carotid bruits. GI: The abdomen was soft, nontender, and nondistended. There were no palpable masses. There was no hepatosplenomegaly. There were normal bowel sounds in all quadrants. GU:  Rectal deferred.   MUSCULOSKELETAL:  Normal muscle strength and tone.  No clubbing or cyanosis.   SKIN:  There were no pathologic skin lesions.  There were no nodules on palpation. NEUROLOGIC:  Sensation is normal.  Cranial nerves are grossly intact. PSYCH:  Oriented to person, place and time.  Mood and affect are normal.  Data Reviewed I have independently reviewed the patient's CT scans.  I have personally reviewed the patient's imaging, laboratory findings and medical records.    Assessment    There is a right lower lobe mass which has some associated hilar adenopathy. The pneumonic process in the right lower lobe is improved but there is still essentially obstructing tumor. I believe that this is most likely a malignant lesion  in the right lower lobe.    Plan    I would like patient to follow-up with Dr. Lake Bells. He had previously seen the patient diagnosed COPD. We'll also get some pulmonary function studies and a PET scan and I will follow-up with the patient next week.      Nestor Lewandowsky, MD 01/30/2015, 11:10 AM

## 2015-01-30 NOTE — Progress Notes (Signed)
Sob on exertion and urinary urgency.  No other complaints noted

## 2015-01-31 ENCOUNTER — Other Ambulatory Visit: Payer: Self-pay | Admitting: Cardiothoracic Surgery

## 2015-01-31 DIAGNOSIS — R918 Other nonspecific abnormal finding of lung field: Secondary | ICD-10-CM

## 2015-02-06 ENCOUNTER — Encounter
Admission: RE | Admit: 2015-02-06 | Discharge: 2015-02-06 | Disposition: A | Discharge status: Home / Self Care | Payer: Medicare Other | Source: Ambulatory Visit | Attending: Internal Medicine | Admitting: Internal Medicine

## 2015-02-06 DIAGNOSIS — Z83511 Family history of glaucoma: Secondary | ICD-10-CM | POA: Diagnosis not present

## 2015-02-06 DIAGNOSIS — F17210 Nicotine dependence, cigarettes, uncomplicated: Secondary | ICD-10-CM | POA: Diagnosis not present

## 2015-02-06 DIAGNOSIS — R911 Solitary pulmonary nodule: Secondary | ICD-10-CM | POA: Diagnosis present

## 2015-02-06 DIAGNOSIS — F410 Panic disorder [episodic paroxysmal anxiety] without agoraphobia: Secondary | ICD-10-CM | POA: Diagnosis not present

## 2015-02-06 DIAGNOSIS — Z8041 Family history of malignant neoplasm of ovary: Secondary | ICD-10-CM | POA: Diagnosis not present

## 2015-02-06 DIAGNOSIS — Z7982 Long term (current) use of aspirin: Secondary | ICD-10-CM | POA: Diagnosis not present

## 2015-02-06 DIAGNOSIS — Z800 Family history of malignant neoplasm of digestive organs: Secondary | ICD-10-CM | POA: Diagnosis not present

## 2015-02-06 DIAGNOSIS — Z8711 Personal history of peptic ulcer disease: Secondary | ICD-10-CM | POA: Diagnosis not present

## 2015-02-06 DIAGNOSIS — F329 Major depressive disorder, single episode, unspecified: Secondary | ICD-10-CM | POA: Diagnosis not present

## 2015-02-06 DIAGNOSIS — Z79899 Other long term (current) drug therapy: Secondary | ICD-10-CM | POA: Diagnosis not present

## 2015-02-06 DIAGNOSIS — F419 Anxiety disorder, unspecified: Secondary | ICD-10-CM | POA: Diagnosis not present

## 2015-02-06 DIAGNOSIS — C3431 Malignant neoplasm of lower lobe, right bronchus or lung: Secondary | ICD-10-CM | POA: Diagnosis not present

## 2015-02-06 DIAGNOSIS — M8580 Other specified disorders of bone density and structure, unspecified site: Secondary | ICD-10-CM | POA: Diagnosis not present

## 2015-02-06 DIAGNOSIS — Z833 Family history of diabetes mellitus: Secondary | ICD-10-CM | POA: Diagnosis not present

## 2015-02-06 DIAGNOSIS — G4730 Sleep apnea, unspecified: Secondary | ICD-10-CM | POA: Diagnosis not present

## 2015-02-06 DIAGNOSIS — Z803 Family history of malignant neoplasm of breast: Secondary | ICD-10-CM | POA: Diagnosis not present

## 2015-02-06 DIAGNOSIS — Z8261 Family history of arthritis: Secondary | ICD-10-CM | POA: Diagnosis not present

## 2015-02-06 DIAGNOSIS — Z820 Family history of epilepsy and other diseases of the nervous system: Secondary | ICD-10-CM | POA: Diagnosis not present

## 2015-02-06 DIAGNOSIS — J45909 Unspecified asthma, uncomplicated: Secondary | ICD-10-CM | POA: Diagnosis not present

## 2015-02-06 HISTORY — DX: Abnormal blood-gas level: R79.81

## 2015-02-06 LAB — BASIC METABOLIC PANEL
ANION GAP: 7 (ref 5–15)
BUN: 13 mg/dL (ref 6–20)
CALCIUM: 9.2 mg/dL (ref 8.9–10.3)
CO2: 27 mmol/L (ref 22–32)
Chloride: 103 mmol/L (ref 101–111)
Creatinine, Ser: 0.63 mg/dL (ref 0.44–1.00)
GFR calc Af Amer: >60 mL/min (ref >60)
GLUCOSE: 92 mg/dL (ref 65–99)
Potassium: 4.4 mmol/L (ref 3.5–5.1)
SODIUM: 137 mmol/L (ref 135–145)

## 2015-02-06 LAB — CBC
HCT: 44.3 % (ref 35.0–47.0)
Hemoglobin: 14.8 g/dL (ref 12.0–16.0)
MCH: 30.9 pg (ref 26.0–34.0)
MCHC: 33.4 g/dL (ref 32.0–36.0)
MCV: 92.3 fL (ref 80.0–100.0)
PLATELETS: 219 10*3/uL (ref 150–440)
RBC: 4.8 MIL/uL (ref 3.80–5.20)
RDW: 14.4 % (ref 11.5–14.5)
WBC: 8.4 10*3/uL (ref 3.6–11.0)

## 2015-02-06 NOTE — Patient Instructions (Signed)
  Your procedure is scheduled on: 02/07/15 Report to Day Surgery. To find out your arrival time please call [phone] between 1PM - 3PM on 02/06/15.  Remember: Instructions that are not followed completely may result in serious medical risk, up to and including death, or upon the discretion of your surgeon and anesthesiologist your surgery may need to be rescheduled.    _x___ 1. Do not eat food or drink liquids after midnight. No gum chewing or hard candies.     __x__ 2. No Alcohol for 24 hours before or after surgery.   ____ 3. Bring all medications with you on the day of surgery if instructed.    __x__ 4. Notify your doctor if there is any change in your medical condition     (cold, fever, infections).     Do not wear jewelry, make-up, hairpins, clips or nail polish.  Do not wear lotions, powders, or perfumes. You may wear deodorant.  Do not shave 48 hours prior to surgery. Men may shave face and neck.  Do not bring valuables to the hospital.    [facility] is not responsible for any belongings or valuables.               Contacts, dentures or bridgework may not be worn into surgery.  Leave your suitcase in the car. After surgery it may be brought to your room.  For patients admitted to the hospital, discharge time is determined by your                treatment team.   Patients discharged the day of surgery will not be allowed to drive home.   Please read over the following fact sheets that you were given:   Surgical Site Infection Prevention   ____ Take these medicines the morning of surgery with A SIP OF WATER:    1. lexapro  2. omeprazole  3. buspar  4.  5.  6.  ____ Fleet Enema (as directed)   ____ Use CHG Soap as directed  _x___ Use inhalers on the day of surgery  ____ Stop metformin 2 days prior to surgery    ____ Take 1/2 of usual insulin dose the night before surgery and none on the morning of surgery.   ____ Stop Coumadin/Plavix/aspirin on ____ Stop  Anti-inflammatories on    ____ Stop supplements until after surgery.    ____ Bring C-Pap to the hospital.

## 2015-02-07 ENCOUNTER — Ambulatory Visit: Payer: Medicare Other

## 2015-02-07 ENCOUNTER — Encounter: Payer: Self-pay | Admitting: *Deleted

## 2015-02-07 ENCOUNTER — Ambulatory Visit
Admission: RE | Admit: 2015-02-07 | Discharge: 2015-02-07 | Disposition: A | Discharge status: Home / Self Care | Payer: Medicare Other | Source: Ambulatory Visit | Attending: Internal Medicine | Admitting: Internal Medicine

## 2015-02-07 ENCOUNTER — Encounter
Admission: RE | Disposition: A | Discharge status: Home / Self Care | Payer: Self-pay | Source: Ambulatory Visit | Attending: Internal Medicine

## 2015-02-07 ENCOUNTER — Ambulatory Visit: Payer: Medicare Other | Admitting: Anesthesiology

## 2015-02-07 ENCOUNTER — Ambulatory Visit
Admission: RE | Admit: 2015-02-07 | Discharge: 2015-02-07 | Disposition: A | Discharge status: Home / Self Care | Payer: Medicare Other | Source: Ambulatory Visit | Attending: Cardiothoracic Surgery | Admitting: Cardiothoracic Surgery

## 2015-02-07 DIAGNOSIS — G4730 Sleep apnea, unspecified: Secondary | ICD-10-CM | POA: Insufficient documentation

## 2015-02-07 DIAGNOSIS — Z8711 Personal history of peptic ulcer disease: Secondary | ICD-10-CM | POA: Insufficient documentation

## 2015-02-07 DIAGNOSIS — Z8041 Family history of malignant neoplasm of ovary: Secondary | ICD-10-CM | POA: Insufficient documentation

## 2015-02-07 DIAGNOSIS — M8580 Other specified disorders of bone density and structure, unspecified site: Secondary | ICD-10-CM | POA: Diagnosis not present

## 2015-02-07 DIAGNOSIS — F410 Panic disorder [episodic paroxysmal anxiety] without agoraphobia: Secondary | ICD-10-CM | POA: Diagnosis not present

## 2015-02-07 DIAGNOSIS — F329 Major depressive disorder, single episode, unspecified: Secondary | ICD-10-CM | POA: Insufficient documentation

## 2015-02-07 DIAGNOSIS — Z820 Family history of epilepsy and other diseases of the nervous system: Secondary | ICD-10-CM | POA: Diagnosis not present

## 2015-02-07 DIAGNOSIS — Z8261 Family history of arthritis: Secondary | ICD-10-CM | POA: Insufficient documentation

## 2015-02-07 DIAGNOSIS — R918 Other nonspecific abnormal finding of lung field: Secondary | ICD-10-CM

## 2015-02-07 DIAGNOSIS — J45909 Unspecified asthma, uncomplicated: Secondary | ICD-10-CM | POA: Insufficient documentation

## 2015-02-07 DIAGNOSIS — C3431 Malignant neoplasm of lower lobe, right bronchus or lung: Secondary | ICD-10-CM | POA: Insufficient documentation

## 2015-02-07 DIAGNOSIS — Z79899 Other long term (current) drug therapy: Secondary | ICD-10-CM | POA: Insufficient documentation

## 2015-02-07 DIAGNOSIS — C3402 Malignant neoplasm of left main bronchus: Secondary | ICD-10-CM | POA: Insufficient documentation

## 2015-02-07 DIAGNOSIS — Z800 Family history of malignant neoplasm of digestive organs: Secondary | ICD-10-CM | POA: Diagnosis not present

## 2015-02-07 DIAGNOSIS — Z7982 Long term (current) use of aspirin: Secondary | ICD-10-CM | POA: Diagnosis not present

## 2015-02-07 DIAGNOSIS — F419 Anxiety disorder, unspecified: Secondary | ICD-10-CM | POA: Insufficient documentation

## 2015-02-07 DIAGNOSIS — F17210 Nicotine dependence, cigarettes, uncomplicated: Secondary | ICD-10-CM | POA: Diagnosis not present

## 2015-02-07 DIAGNOSIS — Z83511 Family history of glaucoma: Secondary | ICD-10-CM | POA: Insufficient documentation

## 2015-02-07 DIAGNOSIS — Z803 Family history of malignant neoplasm of breast: Secondary | ICD-10-CM | POA: Diagnosis not present

## 2015-02-07 DIAGNOSIS — R911 Solitary pulmonary nodule: Secondary | ICD-10-CM | POA: Diagnosis not present

## 2015-02-07 DIAGNOSIS — J449 Chronic obstructive pulmonary disease, unspecified: Secondary | ICD-10-CM | POA: Diagnosis not present

## 2015-02-07 DIAGNOSIS — Z833 Family history of diabetes mellitus: Secondary | ICD-10-CM | POA: Diagnosis not present

## 2015-02-07 HISTORY — PX: ENDOBRONCHIAL ULTRASOUND: SHX5096

## 2015-02-07 LAB — GLUCOSE, CAPILLARY: GLUCOSE-CAPILLARY: 81 mg/dL (ref 65–99)

## 2015-02-07 SURGERY — ENDOBRONCHIAL ULTRASOUND (EBUS)
Anesthesia: General

## 2015-02-07 MED ORDER — ROCURONIUM BROMIDE 100 MG/10ML IV SOLN
INTRAVENOUS | Status: DC | PRN
  Administered 2015-02-07 (×3): 10 mg via INTRAVENOUS

## 2015-02-07 MED ORDER — SUGAMMADEX SODIUM 200 MG/2ML IV SOLN
INTRAVENOUS | Status: DC | PRN
  Administered 2015-02-07: 164.2 mg via INTRAVENOUS

## 2015-02-07 MED ORDER — LACTATED RINGERS IV SOLN
INTRAVENOUS | Status: DC
  Administered 2015-02-07: 13:00:00 via INTRAVENOUS

## 2015-02-07 MED ORDER — GLYCOPYRROLATE 0.2 MG/ML IJ SOLN
INTRAMUSCULAR | Status: DC | PRN
  Administered 2015-02-07: 0.2 mg via INTRAVENOUS

## 2015-02-07 MED ORDER — SUCCINYLCHOLINE CHLORIDE 20 MG/ML IJ SOLN
INTRAMUSCULAR | Status: DC | PRN
  Administered 2015-02-07: 100 mg via INTRAVENOUS

## 2015-02-07 MED ORDER — LIDOCAINE HCL 2 % EX GEL: 1.0000 "application " | Freq: Once | CUTANEOUS | Status: DC

## 2015-02-07 MED ORDER — FLUDEOXYGLUCOSE F - 18 (FDG) INJECTION
12.8000 | Freq: Once | INTRAVENOUS | Status: DC | PRN
  Administered 2015-02-07: 13.17 via INTRAVENOUS
  Filled 2015-02-07: qty 12.8

## 2015-02-07 MED ORDER — ONDANSETRON HCL 4 MG/2ML IJ SOLN: 4.0000 mg | Freq: Once | INTRAMUSCULAR | Status: DC | PRN

## 2015-02-07 MED ORDER — IPRATROPIUM-ALBUTEROL 0.5-2.5 (3) MG/3ML IN SOLN
RESPIRATORY_TRACT | Status: AC
  Administered 2015-02-07: 3 mL via RESPIRATORY_TRACT
  Filled 2015-02-07: qty 3

## 2015-02-07 MED ORDER — ONDANSETRON HCL 4 MG/2ML IJ SOLN
INTRAMUSCULAR | Status: DC | PRN
  Administered 2015-02-07: 4 mg via INTRAVENOUS

## 2015-02-07 MED ORDER — FENTANYL CITRATE (PF) 100 MCG/2ML IJ SOLN: 25.0000 ug | INTRAMUSCULAR | Status: DC | PRN

## 2015-02-07 MED ORDER — DEXAMETHASONE SODIUM PHOSPHATE 4 MG/ML IJ SOLN
INTRAMUSCULAR | Status: DC | PRN
  Administered 2015-02-07: 10 mg via INTRAVENOUS

## 2015-02-07 MED ORDER — IPRATROPIUM-ALBUTEROL 0.5-2.5 (3) MG/3ML IN SOLN
3.0000 mL | Freq: Once | RESPIRATORY_TRACT | Status: AC
  Administered 2015-02-07: 3 mL via RESPIRATORY_TRACT

## 2015-02-07 MED ORDER — MIDAZOLAM HCL 2 MG/2ML IJ SOLN
INTRAMUSCULAR | Status: DC | PRN
  Administered 2015-02-07: 2 mg via INTRAVENOUS

## 2015-02-07 MED ORDER — PROPOFOL 10 MG/ML IV BOLUS
INTRAVENOUS | Status: DC | PRN
  Administered 2015-02-07: 150 mg via INTRAVENOUS

## 2015-02-07 MED ORDER — FENTANYL CITRATE (PF) 100 MCG/2ML IJ SOLN
INTRAMUSCULAR | Status: DC | PRN
  Administered 2015-02-07 (×2): 50 ug via INTRAVENOUS

## 2015-02-07 NOTE — Anesthesia Procedure Notes (Signed)
Procedure Name: Intubation Date/Time: 02/07/2015 1:23 PM Performed by: Jonna Clark Pre-anesthesia Checklist: Patient identified, Patient being monitored, Timeout performed, Emergency Drugs available and Suction available Patient Re-evaluated:Patient Re-evaluated prior to inductionOxygen Delivery Method: Circle system utilized Preoxygenation: Pre-oxygenation with 100% oxygen Intubation Type: IV induction Ventilation: Mask ventilation without difficulty Laryngoscope Size: Mac and 3 Grade View: Grade I Tube type: Oral Tube size: 8.5 mm Number of attempts: 1 Placement Confirmation: ETT inserted through vocal cords under direct vision,  positive ETCO2 and breath sounds checked- equal and bilateral Secured at: 21 cm Tube secured with: Tape Dental Injury: Teeth and Oropharynx as per pre-operative assessment

## 2015-02-07 NOTE — Anesthesia Preprocedure Evaluation (Signed)
Anesthesia Evaluation  Patient identified by MRN, date of birth, ID band Patient awake    Reviewed: Allergy & Precautions, H&P , NPO status , Patient's Chart, lab work & pertinent test results, reviewed documented beta blocker date and time   History of Anesthesia Complications Negative for: history of anesthetic complications  Airway Mallampati: II  TM Distance: >3 FB Neck ROM: full    Dental no notable dental hx. (+) Missing, Poor Dentition   Pulmonary shortness of breath and with exertion, asthma , neg sleep apnea, COPD,  COPD inhaler and oxygen dependent, neg recent URI, Current Smoker,    Pulmonary exam normal breath sounds clear to auscultation       Cardiovascular Exercise Tolerance: Good negative cardio ROS Normal cardiovascular exam Rhythm:regular Rate:Normal     Neuro/Psych PSYCHIATRIC DISORDERS (depression and anxiety) negative neurological ROS     GI/Hepatic Neg liver ROS, PUD, GERD  Medicated and Controlled,  Endo/Other  negative endocrine ROS  Renal/GU negative Renal ROS  negative genitourinary   Musculoskeletal   Abdominal   Peds  Hematology negative hematology ROS (+)   Anesthesia Other Findings Past Medical History:   Anxiety                                                      Asthma                                                       SOB (shortness of breath)                                    Osteopenia                                                   Depression                                                   Panic disorder                                               History of peptic ulcer disease                              Personal history of tobacco use, presenting ha* 01/16/2015    Low oxygen saturation                                          Comment:2l hs   Sleep apnea  Reproductive/Obstetrics negative OB ROS                              Anesthesia Physical Anesthesia Plan  ASA: III  Anesthesia Plan: General   Post-op Pain Management:    Induction:   Airway Management Planned:   Additional Equipment:   Intra-op Plan:   Post-operative Plan:   Informed Consent: I have reviewed the patients History and Physical, chart, labs and discussed the procedure including the risks, benefits and alternatives for the proposed anesthesia with the patient or authorized representative who has indicated his/her understanding and acceptance.   Dental Advisory Given  Plan Discussed with: Anesthesiologist, CRNA and Surgeon  Anesthesia Plan Comments:         Anesthesia Quick Evaluation

## 2015-02-07 NOTE — Interval H&P Note (Signed)
History and Physical Interval Note:  02/07/2015 1:02 PM  Cathy Watts  has presented today for surgery, with the diagnosis of right lung mass  The various methods of treatment have been discussed with the patient and family. After consideration of risks, benefits and other options for treatment, the patient has consented to  Procedure(s): ENDOBRONCHIAL ULTRASOUND (N/A) as a surgical intervention .  The patient's history has been reviewed, patient examined, no change in status, stable for surgery.  I have reviewed the patient's chart and labs.  Questions were answered to the patient's satisfaction.     Flora Lipps

## 2015-02-07 NOTE — Transfer of Care (Signed)
Immediate Anesthesia Transfer of Care Note  Patient: Cathy Watts  Procedure(s) Performed: Procedure(s): ENDOBRONCHIAL ULTRASOUND (N/A)  Patient Location: PACU  Anesthesia Type:General  Level of Consciousness: awake, alert  and oriented  Airway & Oxygen Therapy: Patient Spontanous Breathing and Patient connected to nasal cannula oxygen  Post-op Assessment: Report given to RN and Post -op Vital signs reviewed and stable  Post vital signs: Reviewed and stable  Last Vitals:  Filed Vitals:   02/07/15 1421  BP: 105/66  Pulse: 115  Temp: 36.8 C  Resp: 19    Complications: No apparent anesthesia complications

## 2015-02-07 NOTE — Discharge Instructions (Signed)
Flexible Bronchoscopy, Care After ° °Refer to this sheet in the next few weeks. These instructions provide you with information on caring for yourself after your procedure. Your health care provider may also give you more specific instructions. Your treatment has been planned according to current medical practices, but problems sometimes occur. Call your health care provider if you have any problems or questions after your procedure.  °WHAT TO EXPECT AFTER THE PROCEDURE °It is normal to have the following symptoms for 24-48 hours after the procedure:  °· Increased cough. °· Low-grade fever. °· Sore throat or hoarse voice. °· Small streaks of blood in your thick spit (sputum) if tissue samples were taken (biopsy). °HOME CARE INSTRUCTIONS  °· Do not eat or drink anything for 2 hours after your procedure. Your nose and throat were numbed by medicine. If you try to eat or drink before the medicine wears off, food or drink could go into your lungs or you could burn yourself. After the numbness is gone and your cough and gag reflexes have returned, you may eat soft food and drink liquids slowly.   °· The day after the procedure, you can go back to your normal diet.   °· You may resume normal activities.   °· Keep all follow-up visits as directed by your health care provider. It is important to keep all your appointments, especially if tissue samples were taken for testing (biopsy). °SEEK IMMEDIATE MEDICAL CARE IF:  °· You have increasing shortness of breath.   °· You become light-headed or faint.   °· You have chest pain.   °· You have any new concerning symptoms. °· You cough up more than a small amount of blood. °· The amount of blood you cough up increases. °MAKE SURE YOU: °· Understand these instructions. °· Will watch your condition. °· Will get help right away if you are not doing well or get worse. °Document Released: 11/09/2004 Document Revised: 09/06/2013 Document Reviewed: 12/25/2012 °[employer]® Patient  Information ©2015 [employer]. This information is not intended to replace advice given to you by your health care provider. Make sure you discuss any questions you have with your health care provider. ° °

## 2015-02-07 NOTE — H&P (View-Only) (Signed)
  Oncology Nurse Navigator Documentation    Navigator Encounter Type: Clinic/MDC (01/30/15 1100) Patient Visit Type: Surgery (01/30/15 1100) Treatment Phase: Abnormal Scans (01/30/15 1100)                  Time Spent with Patient: 30 (01/30/15 1100)   Met with patient at thoracic surgery appointment. Introduced Programmer, multimedia and reviewed plan of care. Will follow and assist with coordination of obtaining pfts and ebus. Patient verbalizes preference for having appointments here in [place] if possible.

## 2015-02-08 NOTE — Op Note (Signed)
[facility] Patient Name: Cathy Watts Procedure Date: 02/07/2015 12:26 PM MRN: [id number] Account #: [id number] Date of Birth: 03-28-1946 Admit Type: Outpatient Age: 69 Room: [place] Suite on 2nd floor Gender: Female Note Status: Finalized Attending MD: Patricia Pesa, MD Procedure:         Bronchoscopy Indications:       Right lower lobe mass Providers:         Patricia Pesa, MD, Lang Snow, RCP (Technician) Referring MD:       Medicines:         General Anesthesia Complications:     No immediate complications Procedure:         Pre-Anesthesia Assessment:                    - A History and Physical has been performed. The patient's                     medications, allergies and sensitivities have been                     reviewed.                    After obtaining informed consent, the bronchoscope was                     passed under direct vision. Throughout the procedure, the                     patient's blood pressure, pulse, and oxygen saturations                     were monitored continuously. the Bronchoscope was                     introduced through the mouth, via the endotracheal tube                     and advanced to the right lung only. The procedure was                     accomplished without difficulty. The patient tolerated the                     procedure well. Findings:      Right Lung Abnormalities: A barely obstructing (less than 10%       obstruction) mass was found proximally in the right lower lobeseen under       Ultrasound view aporx 3x4 cm. The mass was medium-sized.      Transbronchial biopsies were performed in the right lower lobe of the       lung using a 21 gauge Wang needle and sent for routine cytology. The       procedure was guided by ultrasound. Seven biopsy passes were performed.       Seven biopsy samples were obtained. Impression:        - A mass was found in the right lower lobe. This lesion is   likely malignant.                    - Transbronchial lung biopsies were performed. Recommendation:    - Await biopsy results. Patricia Pesa MD Patricia Pesa, MD 02/07/2015 2:01:41 PM This report has been signed electronically. Number of Addenda: 0 Note Initiated On: 02/07/2015 12:26 PM  [facility]

## 2015-02-09 ENCOUNTER — Inpatient Hospital Stay: Payer: Medicare Other | Attending: Cardiothoracic Surgery | Admitting: Cardiothoracic Surgery

## 2015-02-09 VITALS — BP 131/74 | HR 56 | Temp 97.6°F | Ht 71.0 in | Wt 178.4 lb

## 2015-02-09 DIAGNOSIS — J45909 Unspecified asthma, uncomplicated: Secondary | ICD-10-CM | POA: Diagnosis not present

## 2015-02-09 DIAGNOSIS — F419 Anxiety disorder, unspecified: Secondary | ICD-10-CM | POA: Diagnosis not present

## 2015-02-09 DIAGNOSIS — Z79899 Other long term (current) drug therapy: Secondary | ICD-10-CM | POA: Diagnosis not present

## 2015-02-09 DIAGNOSIS — I2510 Atherosclerotic heart disease of native coronary artery without angina pectoris: Secondary | ICD-10-CM | POA: Diagnosis not present

## 2015-02-09 DIAGNOSIS — F17210 Nicotine dependence, cigarettes, uncomplicated: Secondary | ICD-10-CM | POA: Insufficient documentation

## 2015-02-09 DIAGNOSIS — R05 Cough: Secondary | ICD-10-CM | POA: Insufficient documentation

## 2015-02-09 DIAGNOSIS — K219 Gastro-esophageal reflux disease without esophagitis: Secondary | ICD-10-CM | POA: Insufficient documentation

## 2015-02-09 DIAGNOSIS — C3431 Malignant neoplasm of lower lobe, right bronchus or lung: Secondary | ICD-10-CM | POA: Insufficient documentation

## 2015-02-09 DIAGNOSIS — G4730 Sleep apnea, unspecified: Secondary | ICD-10-CM | POA: Diagnosis not present

## 2015-02-09 DIAGNOSIS — J449 Chronic obstructive pulmonary disease, unspecified: Secondary | ICD-10-CM | POA: Diagnosis not present

## 2015-02-09 DIAGNOSIS — R918 Other nonspecific abnormal finding of lung field: Secondary | ICD-10-CM | POA: Diagnosis not present

## 2015-02-09 DIAGNOSIS — Z7982 Long term (current) use of aspirin: Secondary | ICD-10-CM | POA: Diagnosis not present

## 2015-02-09 LAB — CYTOLOGY - NON PAP

## 2015-02-09 NOTE — Anesthesia Postprocedure Evaluation (Signed)
  Anesthesia Post-op Note  Patient: Cathy Watts  Procedure(s) Performed: Procedure(s): ENDOBRONCHIAL ULTRASOUND (N/A)  Anesthesia type:General  Patient location: PACU  Post pain: Pain level controlled  Post assessment: Post-op Vital signs reviewed, Patient's Cardiovascular Status Stable, Respiratory Function Stable, Patent Airway and No signs of Nausea or vomiting  Post vital signs: Reviewed and stable  Last Vitals:  Filed Vitals:   02/07/15 1517  BP: 120/54  Pulse: 91  Temp: 36.2 C  Resp: 18    Level of consciousness: awake, alert  and patient cooperative  Complications: No apparent anesthesia complications

## 2015-02-09 NOTE — Progress Notes (Signed)
Patient here for test result consultation. No complaints today.

## 2015-02-09 NOTE — Progress Notes (Signed)
Cathy Watts Inpatient Post-Op Note  Patient ID: Cathy Watts, female   DOB: 1946/05/03, 69 y.o.   MRN: [id number]  HISTORY: This patient returns today in follow-up. She did have some pulmonary function studies done which reveal an FEV1 of 55%. She states that she does get short of breath with activities. She did walk up a flight of stairs today to the second floor the cancer center was quite short of breath with that. She states that she's been coughing more since her bronchoscopy. She denied any fevers.   Filed Vitals:   02/09/15 0932  BP: 131/74  Pulse: 56  Temp: 97.6 F (36.4 C)     EXAM: Resp: Lungs are clear bilaterally.  No respiratory distress, normal effort. Heart:  Regular without murmurs Abd:  Abdomen is soft, non distended and non tender. No masses are palpable.  There is no rebound and no guarding.  Neurological: Alert and oriented to person, place, and time. Coordination normal.  Skin: Skin is warm and dry. No rash noted. No diaphoretic. No erythema. No pallor.  Psychiatric: Normal mood and affect. Normal behavior. Judgment and thought content normal.    ASSESSMENT: I had a long discussion with the patient regarding the options. Her FEV1 is 55%. I discussed with her the options of surgical intervention with or without preoperative pulmonary rehabilitation. We spent a considerable period time on smoking cessation. I also discussed with her the option of definitive radiotherapy and/or chemotherapy for management. At the present time she would like to meet with our oncologist and radiation therapist to discuss what role they may have in managing her presumed lung cancer. The biopsies from the endobronchial ultrasound are currently pending but should be back later today.   PLAN:   I did not make a return visit for her as she would like to meet with our oncologist and radiation therapist. I explained to her that I would be happy to review again with her the surgical options. I do  believe she would need a right middle and lower lobectomies based on the location of the tumor. She understands and will be back in touch.    Nestor Lewandowsky, MD

## 2015-02-13 ENCOUNTER — Telehealth: Payer: Self-pay | Admitting: *Deleted

## 2015-02-13 NOTE — Telephone Encounter (Signed)
Per Dr Rogue Bussing, he prefers to discuss this with patient at her appt on Wednesday. I called pt and left message regarding this

## 2015-02-13 NOTE — Telephone Encounter (Signed)
Cytology - Non PAP  CASE: [id number]  PATIENT: Cathy Watts  Non-Gyn Cytology Report      SPECIMEN SUBMITTED:  A. FNA, right lower lobe, lung   CLINICAL HISTORY:  None provided   PRE-OPERATIVE DIAGNOSIS:  None Provided   POST-OPERATIVE DIAGNOSIS:  None Provided      DIAGNOSIS:  A. LUNG, RIGHT LOWER LOBE; EBUS GUIDED FINE-NEEDLE ASPIRATION:  - SQUAMOUS CELL CARCINOMA, SEE NOTE.   Note:  Cell block material was included the interpretation of this case.    GROSS DESCRIPTION:   A. A. Site: right lower lobe  Procedure: EBUS  Cytotechnologist: An Hulen Luster and Malon Kindle  Specimen(s) collected:  4 Diff Quik stained slides  4 Pap stained slides  Specimen labeled RLL :    Description: red CytoLyt solution with multiple wispy pink to tan  fragments    Submitted for:       ThinPrep       Cell block(s): 2    Final Diagnosis performed by Delorse Lek, MD. Electronically signed  02/09/2015 4:02:11PM     The electronic signature indicates that the named Attending Pathologist  has evaluated the specimen   Technical component performed at [facility], [address], [place],  [place] [address]  Lab: [phone] Dir: Darrick Penna. Evette Doffing, MD   Professional component performed at [facility], [facility],  [place], [place], [place] [address]  Lab: [phone] Dir: Dellia Nims. Reuel Derby, MD

## 2015-02-14 ENCOUNTER — Encounter: Payer: Self-pay | Admitting: *Deleted

## 2015-02-15 ENCOUNTER — Encounter: Payer: Self-pay | Admitting: Radiation Oncology

## 2015-02-15 ENCOUNTER — Inpatient Hospital Stay (HOSPITAL_BASED_OUTPATIENT_CLINIC_OR_DEPARTMENT_OTHER): Payer: Medicare Other | Admitting: Internal Medicine

## 2015-02-15 ENCOUNTER — Encounter: Payer: Self-pay | Admitting: Internal Medicine

## 2015-02-15 ENCOUNTER — Ambulatory Visit
Admission: RE | Admit: 2015-02-15 | Discharge: 2015-02-15 | Disposition: A | Discharge status: Home / Self Care | Payer: Medicare Other | Source: Ambulatory Visit | Attending: Radiation Oncology | Admitting: Radiation Oncology

## 2015-02-15 VITALS — BP 154/80 | HR 58 | Temp 96.0°F | Resp 18 | Ht 71.0 in | Wt 174.6 lb

## 2015-02-15 VITALS — BP 149/81 | HR 70 | Temp 96.6°F | Resp 20 | Wt 175.6 lb

## 2015-02-15 DIAGNOSIS — Z510 Encounter for antineoplastic radiation therapy: Secondary | ICD-10-CM | POA: Insufficient documentation

## 2015-02-15 DIAGNOSIS — C3411 Malignant neoplasm of upper lobe, right bronchus or lung: Secondary | ICD-10-CM

## 2015-02-15 DIAGNOSIS — K219 Gastro-esophageal reflux disease without esophagitis: Secondary | ICD-10-CM | POA: Diagnosis not present

## 2015-02-15 DIAGNOSIS — Z79899 Other long term (current) drug therapy: Secondary | ICD-10-CM | POA: Diagnosis not present

## 2015-02-15 DIAGNOSIS — F419 Anxiety disorder, unspecified: Secondary | ICD-10-CM

## 2015-02-15 DIAGNOSIS — Z7982 Long term (current) use of aspirin: Secondary | ICD-10-CM | POA: Diagnosis not present

## 2015-02-15 DIAGNOSIS — R05 Cough: Secondary | ICD-10-CM | POA: Diagnosis not present

## 2015-02-15 DIAGNOSIS — I2510 Atherosclerotic heart disease of native coronary artery without angina pectoris: Secondary | ICD-10-CM

## 2015-02-15 DIAGNOSIS — J45909 Unspecified asthma, uncomplicated: Secondary | ICD-10-CM

## 2015-02-15 DIAGNOSIS — J449 Chronic obstructive pulmonary disease, unspecified: Secondary | ICD-10-CM | POA: Insufficient documentation

## 2015-02-15 DIAGNOSIS — G4730 Sleep apnea, unspecified: Secondary | ICD-10-CM | POA: Diagnosis not present

## 2015-02-15 DIAGNOSIS — C3491 Malignant neoplasm of unspecified part of right bronchus or lung: Secondary | ICD-10-CM

## 2015-02-15 DIAGNOSIS — F329 Major depressive disorder, single episode, unspecified: Secondary | ICD-10-CM | POA: Insufficient documentation

## 2015-02-15 DIAGNOSIS — M8580 Other specified disorders of bone density and structure, unspecified site: Secondary | ICD-10-CM | POA: Insufficient documentation

## 2015-02-15 DIAGNOSIS — F17210 Nicotine dependence, cigarettes, uncomplicated: Secondary | ICD-10-CM | POA: Diagnosis not present

## 2015-02-15 DIAGNOSIS — C3401 Malignant neoplasm of right main bronchus: Secondary | ICD-10-CM | POA: Insufficient documentation

## 2015-02-15 DIAGNOSIS — C3431 Malignant neoplasm of lower lobe, right bronchus or lung: Secondary | ICD-10-CM | POA: Diagnosis not present

## 2015-02-15 NOTE — Consult Note (Signed)
Except an outstanding is perfect of Radiation Oncology NEW PATIENT EVALUATION  Name: Cathy Watts  MRN: [id number]  Date:   02/15/2015     DOB: 02/23/1946   This 69 y.o. 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  REFERRING PHYSICIAN: Margarita Rana, MD  CHIEF COMPLAINT:  Chief Complaint  Patient presents with  . Lung Cancer    Pt is here for initial consultation of lung cancer.      DIAGNOSIS: The encounter diagnosis was Malignant neoplasm of right lung, unspecified part of lung ([place]).   PREVIOUS INVESTIGATIONS:  CT scans and PET/CT scans reviewed Pathology reports reviewed Clinical notes reviewed  HPI: Patient is a 69 year old female who's had a long history of chronic cough and smoking history with noted COPD. She is oxygen dependent at night. She developed right lower lobe pneumonia which eventually cleared although that we were at was a residual mass by CT criteria in the right hilum consistent with an endobronchial tumor. She underwent endobronchial biopsy showing squamous cell carcinoma. PET CT temperature hypermetabolic activity near the right hilum consistent with the known tumor no evidence of mediastinal or hilar adenopathy was identified. No distant metastatic disease was noted. She underwent pulmonary testing which showed an FEV1 of 55% of predicted not thought to be a good surgical candidate by Dr. Faith Rogue surgical oncologist. She is also seeing medical oncology is now referred to radiation oncology for opinion. She's having no hemoptysis. He continues to have shortness of breath and dyspnea on exertion with chronic nonproductive cough.  PLANNED TREATMENT REGIMEN: I MRT radiation therapy  PAST MEDICAL HISTORY:  has a past medical history of Anxiety; Asthma; SOB (shortness of breath); Osteopenia; Depression; Panic disorder; History of peptic ulcer disease; Personal history of tobacco  use, presenting hazards to health (01/16/2015); Low oxygen saturation; Sleep apnea; Squamous cell carcinoma of right lung (HCC); COPD (chronic obstructive pulmonary disease) ([place]); Barrett's esophagus; and GERD (gastroesophageal reflux disease).    PAST SURGICAL HISTORY:  Past Surgical History  Procedure Laterality Date  . Tubal ligation  1970's  . Cholecystectomy  1980's  . Elbow fracture surgery Right 2009  . Tonsillectomy  1979  . Cervical cone biopsy  1990  . Endobronchial ultrasound N/A 02/07/2015    Procedure: ENDOBRONCHIAL ULTRASOUND;  Surgeon: Flora Lipps, MD;  Location: [facility] ORS;  Service: Cardiopulmonary;  Laterality: N/A;  . Upper gi endoscopy    . Colonoscopy  2014    FAMILY HISTORY: family history includes Arthritis in her mother; Breast cancer in her sister; Cancer in her paternal aunt; Cirrhosis in her sister; Colon cancer in her mother; Diabetes in her mother and sister; Glaucoma in her mother; Heart attack in her sister; Ovarian cancer in her sister; Parkinson's disease in her sister; Stomach cancer in her father; Throat cancer in her brother.  SOCIAL HISTORY:  reports that she has been smoking Cigarettes.  She has a 55 pack-year smoking history. She has never used smokeless tobacco. She reports that she does not drink alcohol or use illicit drugs.  ALLERGIES: Review of patient's allergies indicates no known allergies.  MEDICATIONS:  Current Outpatient Prescriptions  Medication Sig Dispense Refill  . albuterol (PROVENTIL HFA;VENTOLIN HFA) 108 (90 BASE) MCG/ACT inhaler Inhale 2 puffs into the lungs every 6 (six) hours as needed for shortness of breath (or 30 minutes before exercize).    Marland Kitchen aspirin 81 MG tablet Take 81 mg by  mouth daily.    . busPIRone (BUSPAR) 30 MG tablet Take 1 tablet by mouth 2 (two) times daily.    Marland Kitchen CALCIUM CARBONATE-VIT D-MIN PO Take 1 tablet by mouth 3 (three) times daily.    . clotrimazole-betamethasone (LOTRISONE) cream LOTRISONE, 1-0.05% (External  Cream)  1 (one) Cream Cream Apply twice daily to affected area.  Not for Internal use.  (Correction from prev script said not for external use and should read , not for internal use) for 0 days  Quantity: 45;  Refills: 1   Ordered :22-Apr-2013  Renaldo Fiddler ;  Started 22-Apr-2013 Active    . escitalopram (LEXAPRO) 20 MG tablet Take 1 tablet by mouth daily.    . Omega-3 Fatty Acids (FISH OIL) 1200 MG CAPS Take 1 capsule by mouth daily.    Marland Kitchen omeprazole (PRILOSEC) 20 MG capsule Take 20 mg by mouth daily.    . polyethylene glycol powder (GLYCOLAX/MIRALAX) powder Take 17 g by mouth daily.    . pravastatin (PRAVACHOL) 20 MG tablet Take 1 tablet (20 mg total) by mouth at bedtime. 90 tablet 3  . Umeclidinium-Vilanterol (ANORO ELLIPTA) 62.5-25 MCG/INH AEPB Inhale 1 puff into the lungs daily. 60 each 6   No current facility-administered medications for this encounter.    ECOG PERFORMANCE STATUS:  1 - Symptomatic but completely ambulatory  REVIEW OF SYSTEMS: Except for chronic nonproductive cough oxygen dependent COPD Patient denies any weight loss, fatigue, weakness, fever, chills or night sweats. Patient denies any loss of vision, blurred vision. Patient denies any ringing  of the ears or hearing loss. No irregular heartbeat. Patient denies heart murmur or history of fainting. Patient denies any chest pain or pain radiating to her upper extremities. Patient denies any shortness of breath, difficulty breathing at night, cough or hemoptysis. Patient denies any swelling in the lower legs. Patient denies any nausea vomiting, vomiting of blood, or coffee ground material in the vomitus. Patient denies any stomach pain. Patient states has had normal bowel movements no significant constipation or diarrhea. Patient denies any dysuria, hematuria or significant nocturia. Patient denies any problems walking, swelling in the joints or loss of balance. Patient denies any skin changes, loss of hair or loss of  weight. Patient denies any excessive worrying or anxiety or significant depression. Patient denies any problems with insomnia. Patient denies excessive thirst, polyuria, polydipsia. Patient denies any swollen glands, patient denies easy bruising or easy bleeding. Patient denies any recent infections, allergies or URI. Patient "s visual fields have not changed significantly in recent time.    PHYSICAL EXAM: BP 149/81 mmHg  Pulse 70  Temp(Src) 96.6 F (35.9 C)  Resp 20  Wt 175 lb 9.5 oz (79.65 kg) Well-developed well-nourished patient in NAD. HEENT reveals PERLA, EOMI, discs not visualized.  Oral cavity is clear. No oral mucosal lesions are identified. Neck is clear without evidence of cervical or supraclavicular adenopathy. Lungs are clear to A&P. Cardiac examination is essentially unremarkable with regular rate and rhythm without murmur rub or thrill. Abdomen is benign with no organomegaly or masses noted. Motor sensory and DTR levels are equal and symmetric in the upper and lower extremities. Cranial nerves II through XII are grossly intact. Proprioception is intact. No peripheral adenopathy or edema is identified. No motor or sensory levels are noted. Crude visual fields are within normal range.  LABORATORY DATA: Pathology reports reviewed    RADIOLOGY RESULTS: PET CT and CT scans reviewed   IMPRESSION: Stage I a squamous cell carcinoma of the  right parahilar region of the right lower lobe in 69 year old female  PLAN: I have discussed the case personally with medical oncology. Patient is a nonsurgical candidate by surgical oncologist opinion. Do not feel with her stage IA disease she needs chemotherapy and has been confirmed by medical oncology. Would plan on delivering up to 7000 cGy to a right hilum using I am RT treatment planning and delivery. Would choose I am RT treatment to decrease dose to her esophagus heart normal lung volume based on her significant COPD. Risks and benefits of  treatment including possible exacerbation of cough, fatigue, alteration of blood counts, possible radiation esophagitis and skin reaction all were discussed in detail with the patient and her family. They all seem to comprehend my treatment plan well. I have scheduled and ordered CT simulation early next week.  I would like to take this opportunity for allowing me to participate in the care of your patient.Armstead Peaks., MD

## 2015-02-15 NOTE — Progress Notes (Signed)
[facility] NOTE  Patient Care Team: Margarita Rana, MD as PCP - General (Family Medicine)  CHIEF COMPLAINTS/PURPOSE OF CONSULTATION:  Lung cancer   ONCOLOGIC HISTORY:  # SEP 2016- SQUAMOUS CELL LUNG CA STAGE IA [cT1cN0]; PET scan- equivocal hilar lymph node [Dr.Kasa; EBUS Bx]; START RT [Dr.Crystal]  # COPD on 02 at night   HISTORY OF PRESENTING ILLNESS:  Cathy Watts 69 y.o.  female long-standing history of smoking COPD noted to have a right hilar lung mass on a screening CT scan of the chest; which is further followed by PET scan; and biopsy- that showed squamous cell carcinoma.  Patient has met with thoracic surgery- who feels that patient will likely need a right middle lobectomy/lower lobe resection given the location of tumor; and given her borderline PFTs that nonsurgical options be explored.  Patient has chronic cough otherwise no hemoptysis. Denies any chest pain. Admits to chronic shortness of breath. Otherwise denies any headaches or vision changes or any bone pain.   ROS: A complete 10 point review of system is done which is negative except mentioned above in history of present illness  MEDICAL HISTORY:  Past Medical History  Diagnosis Date  . Anxiety   . Asthma   . SOB (shortness of breath)   . Osteopenia   . Depression   . Panic disorder   . History of peptic ulcer disease   . Personal history of tobacco use, presenting hazards to health 01/16/2015  . Low oxygen saturation     2l hs  . Sleep apnea   . Squamous cell carcinoma of right lung ([place])   . COPD (chronic obstructive pulmonary disease) ([place])   . Barrett's esophagus   . GERD (gastroesophageal reflux disease)     SURGICAL HISTORY: Past Surgical History  Procedure Laterality Date  . Tubal ligation  1970's  . Cholecystectomy  1980's  . Elbow fracture surgery Right 2009  . Tonsillectomy  1979  . Cervical cone biopsy  1990  . Endobronchial ultrasound N/A 02/07/2015     Procedure: ENDOBRONCHIAL ULTRASOUND;  Surgeon: Flora Lipps, MD;  Location: [facility] ORS;  Service: Cardiopulmonary;  Laterality: N/A;  . Upper gi endoscopy    . Colonoscopy  2014    SOCIAL HISTORY: Social History   Social History  . Marital Status: Married    Spouse Name: N/A  . Number of Children: N/A  . Years of Education: N/A   Occupational History  . Not on file.   Social History Main Topics  . Smoking status: Current Every Day Smoker -- 1.00 packs/day for 55 years    Types: Cigarettes  . Smokeless tobacco: Never Used     Comment: used to use E-cig  . Alcohol Use: No  . Drug Use: No  . Sexual Activity: Not on file   Other Topics Concern  . Not on file   Social History Narrative    FAMILY HISTORY: Family History  Problem Relation Age of Onset  . Colon cancer Mother     colon  . Diabetes Mother   . Arthritis Mother   . Glaucoma Mother   . Stomach cancer Father     stomach  . Throat cancer Brother     throat  . Breast cancer Sister     breast  . Diabetes Sister   . Cirrhosis Sister   . Cancer Paternal Aunt   . Parkinson's disease Sister   . Heart attack Sister   . Ovarian cancer Sister  ALLERGIES:  has No Known Allergies.  MEDICATIONS:  Current Outpatient Prescriptions  Medication Sig Dispense Refill  . albuterol (PROVENTIL HFA;VENTOLIN HFA) 108 (90 BASE) MCG/ACT inhaler Inhale 2 puffs into the lungs every 6 (six) hours as needed for shortness of breath (or 30 minutes before exercize).    Marland Kitchen aspirin 81 MG tablet Take 81 mg by mouth daily.    . busPIRone (BUSPAR) 30 MG tablet Take 1 tablet by mouth 2 (two) times daily.    Marland Kitchen CALCIUM CARBONATE-VIT D-MIN PO Take 1 tablet by mouth 3 (three) times daily.    . clotrimazole-betamethasone (LOTRISONE) cream LOTRISONE, 1-0.05% (External Cream)  1 (one) Cream Cream Apply twice daily to affected area.  Not for Internal use.  (Correction from prev script said not for external use and should read , not for internal  use) for 0 days  Quantity: 45;  Refills: 1   Ordered :22-Apr-2013  Renaldo Fiddler ;  Started 22-Apr-2013 Active    . escitalopram (LEXAPRO) 20 MG tablet Take 1 tablet by mouth daily.    . Omega-3 Fatty Acids (FISH OIL) 1200 MG CAPS Take 1 capsule by mouth daily.    Marland Kitchen omeprazole (PRILOSEC) 20 MG capsule Take 20 mg by mouth daily.    . polyethylene glycol powder (GLYCOLAX/MIRALAX) powder Take 17 g by mouth daily.    . pravastatin (PRAVACHOL) 20 MG tablet Take 1 tablet (20 mg total) by mouth at bedtime. 90 tablet 3  . Umeclidinium-Vilanterol (ANORO ELLIPTA) 62.5-25 MCG/INH AEPB Inhale 1 puff into the lungs daily. 60 each 6   No current facility-administered medications for this visit.      Marland Kitchen  PHYSICAL EXAMINATION: ECOG PERFORMANCE STATUS: 1 - Symptomatic but completely ambulatory  Filed Vitals:   02/15/15 1153  BP: 154/80  Pulse: 58  Temp: 96 F (35.6 C)  Resp: 18   Filed Weights   02/15/15 1153  Weight: 174 lb 9.7 oz (79.2 kg)    GENERAL: Well-nourished well-developed; Alert, no distress and comfortable.   She is accompanied by HER-2 daughters. EYES: no pallor or icterus OROPHARYNX: no thrush or ulceration; good dentition  NECK: supple, no masses felt LYMPH:  no palpable lymphadenopathy in the cervical, axillary or inguinal regions LUNGS: clear to auscultation and  No wheeze or crackles HEART/CVS: regular rate & rhythm and no murmurs; No lower extremity edema ABDOMEN: abdomen soft, non-tender and normal bowel sounds Musculoskeletal:no cyanosis of digits and no clubbing  PSYCH: alert & oriented x 3 with fluent speech NEURO: no focal motor/sensory deficits SKIN:  no rashes or significant lesions  LABORATORY DATA:  I have reviewed the data as listed Lab Results  Component Value Date   WBC 8.4 02/06/2015   HGB 14.8 02/06/2015   HCT 44.3 02/06/2015   MCV 92.3 02/06/2015   PLT 219 02/06/2015    Recent Labs  02/06/15 1053  NA 137  K 4.4  CL 103  CO2 27   GLUCOSE 92  BUN 13  CREATININE 0.63  CALCIUM 9.2  GFRNONAA >60  GFRAA >60    RADIOGRAPHIC STUDIES: I have personally reviewed the radiological images as listed and agreed with the findings in the report. Ct Chest Wo Contrast  01/18/2015  CLINICAL DATA:  Abnormal lung cancer screening CT. EXAM: CT CHEST WITHOUT CONTRAST TECHNIQUE: Multidetector CT imaging of the chest was performed following the standard protocol without IV contrast. COMPARISON:  07/08/2014. FINDINGS: Mediastinum/Nodes: No pathologically enlarged mediastinal or axillary lymph nodes. Hilar regions are difficult to definitively evaluate  without IV contrast but there does appear to be asymmetric fullness in the right infrahilar region (series 4, image 37). Heart size normal. No pericardial effusion. Lungs/Pleura: Mild centrilobular and paraseptal emphysema. Scattered tiny peripheral pulmonary nodules measure 5 mm or less in size and are grossly stable. Interval clearing of previously seen patchy airspace consolidation in the right lower lobe with new ill-defined peribronchovascular nodularity in the right lower lobe. No pleural fluid. Debris and narrowing are seen involving the right lower lobe bronchus (series 4, image 37). Airway is otherwise unremarkable. Upper abdomen: Low-attenuation lesions in the liver measure up to 10 mm, too small to characterize. Cholecystectomy. Visualized portion of the right adrenal gland is unremarkable. There may be slight thickening of the body of the left adrenal gland. Visualized portions of the left kidney, spleen, pancreas, stomach and bowel are grossly unremarkable. No upper abdominal adenopathy. Musculoskeletal: No worrisome lytic or sclerotic lesions. IMPRESSION: 1. Interval clearing of previously seen right lower lobe pneumonia. 2. Debris and narrowing involving the right lower lobe bronchus with right hilar fullness. Associated ill-defined peribronchovascular nodularity in the right lower lobe.  Findings are worrisome for a centrally obstructing mass and postobstructive bronchiolitis. Chest CT with contrast is recommended in further evaluation, as clinically indicated. These results will be called to the ordering clinician or representative by the Radiologist Assistant, and communication documented in the PACS or zVision Dashboard. 3. Scattered tiny pulmonary nodules measure 4 mm or less in size and are grossly stable. Electronically Signed   By: Lorin Picket M.D.   On: 01/18/2015 16:27   Ct Chest W Contrast  01/27/2015  CLINICAL DATA:  69 year old female with narrowing of the right lower lobe bronchus and associated hilar fullness on recent noncontrast CT, presenting for further evaluation. EXAM: CT CHEST WITH CONTRAST TECHNIQUE: Multidetector CT imaging of the chest was performed during intravenous contrast administration. CONTRAST:  15m OMNIPAQUE IOHEXOL 300 MG/ML  SOLN COMPARISON:  01/18/2015 noncontrast chest CT. FINDINGS: Mediastinum/Nodes: Normal heart size. No pericardial fluid/thickening. There is atherosclerosis of the thoracic aorta, the great vessels of the mediastinum and the coronary arteries, including calcified atherosclerotic plaque in the left anterior descending coronary artery. Great vessels are normal in course and caliber. No central pulmonary emboli. Normal visualized thyroid. Normal esophagus. No axillary lymphadenopathy. No pathologically enlarged mediastinal nodes. There is an enlarged 1.5 cm right hilar node (series 2/ image 32). No left hilar adenopathy. Lungs/Pleura: No pneumothorax. No pleural effusion. In the central right lower lobe, there is a 2.4 x 1.7 cm solid pulmonary nodule (series 2/ image 36) with irregular margins and associated focal occlusion of the right lower lobe bronchus, suspicious for a primary lung malignancy. Re- demonstrated is patchy centrilobular ground-glass nodularity and tree-in-bud opacity throughout the right lower lobe, not appreciably  changed, in keeping with postobstructive bronchiolitis. No additional significant pulmonary nodules or lung masses. Mild centrilobular emphysema, upper lobe predominant. Diffuse bronchial wall thickening. There is stable patchy subpleural reticulation throughout both lungs predominantly in the upper lobes, without traction bronchiectasis or frank honeycombing. Upper abdomen: There are 2 subcentimeter hypodense lateral segment left liver lobe lesions, too small to characterize, unchanged in the interval. Status post cholecystectomy. Bile ducts are within normal post cholecystectomy limits. Musculoskeletal: Moderate degenerative changes in the thoracic spine. No aggressive appearing focal osseous lesions. IMPRESSION: 1. Irregular 2.4 x 1.7 cm solid pulmonary nodule in the central right lower lobe with associated focal occlusion of the right lower lobe bronchus, suspicious for a primary lung malignancy. Tissue  sampling is advised, likely best accessible by bronchoscopy. 2. 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 3. Right hilar lymphadenopathy, metastasis not excluded. 4. Mild centrilobular emphysema and diffuse bronchial wall thickening, suggesting COPD. 5. Nonspecific subpleural reticulation throughout both lungs, upper lobe predominant, without traction bronchiectasis or frank honeycombing. This finding could indicate an idiopathic interstitial pneumonia such as UIP or NSIP. Consider a follow-up high-resolution chest CT study in 12 months to assess pattern stability. 6. Two subcentimeter hypodense left liver lobe lesions, too small to characterize. 7. Atherosclerosis, including 1 vessel coronary artery disease. Please note that although the presence of coronary artery calcium documents the presence of coronary artery disease, the severity of this disease and any potential stenosis cannot be assessed on this non-gated CT examination. Electronically Signed   By: Ilona Sorrel M.D.   On:  01/27/2015 10:25   Nm Pet Image Initial (pi) Skull Base To Thigh  02/07/2015  CLINICAL DATA:  Initial treatment strategy for right lower lobe lung nodule. EXAM: NUCLEAR MEDICINE PET SKULL BASE TO THIGH TECHNIQUE: 13.2 mCi F-18 FDG was injected intravenously. Full-ring PET imaging was performed from the skull base to thigh after the radiotracer. CT data was obtained and used for attenuation correction and anatomic localization. FASTING BLOOD GLUCOSE:  Value: 81 mg/dl COMPARISON:  Chest CT of 01/27/2015. Abdominal pelvic CT of 09/11/2006. No prior PET. FINDINGS: NECK No areas of abnormal hypermetabolism. CHEST Hypermetabolism corresponding to a right hilar node is Mild, only slightly greater than the mediastinal pool. This measures a S.U.V. max of 3.2 on image/series 94/4. The central right lower lobe lung lesion corresponds to hypermetabolism. This measures 2.4 x 1.7 cm on recent diagnostic CT and a S.U.V. max of 20.6 on image/series 94/4. ABDOMEN/PELVIS No areas of abnormal hypermetabolism. SKELETON Hypermetabolism about left-sided facets, including at C2-3. Favored to be degenerative. CT IMAGES PERFORMED FOR ATTENUATION CORRECTION No cervical adenopathy. Left-sided carotid atherosclerosis. Multivessel coronary artery atherosclerosis. Centrilobular and paraseptal emphysema. Peribronchovascular right lower lobe nodularity is increased since 01/27/2015. Left adrenal thickening is similar. Cholecystectomy. Tiny lateral segment left liver lobe cyst. Infrarenal aortic non aneurysmal dilatation at 2.5 cm. IMPRESSION: 1. Central right lower lobe primary bronchogenic carcinoma. Hypermetabolism corresponding to right lower lobe obstructive pulmonary lesion. 2. Equivocal low-level hypermetabolism at the site of an enlarged right hilar node. 3. Otherwise, no evidence of metastatic disease. 4. Progressive peribronchovascular nodularity in the right lower lobe since 01/27/2015. Likely due to increased postobstructive  pneumonitis. 5.  Atherosclerosis, including within the coronary arteries. 6. Hypermetabolism, corresponding to facet arthropathy involving the left side of the cervical spine. Electronically Signed   By: Abigail Miyamoto M.D.   On: 02/07/2015 12:14    ASSESSMENT & PLAN:   # Squamous cell carcinoma of the lung stage I [ cT1aN0]; hilar lymph node eqivocal on PET scan. I had a long discussion with the patient and family regarding the stage and pathology in detail. The only way to know for sure the status of the lymph node would be surgery. Since her hilar lymph node/primary lung malignancy are fairly close- that these 2 lesions could be treated at the same time.  # I also reviewed the rationale behind adjuvant chemotherapy; however At this time I do not feel strongly about offering chemotherapy for a clinical stage I squamous cell carcinoma.  # I reviewed my above findings/plan with the patient and the 2 daughters in detail. All questions were answered.  I also discussed the above plan of care with Dr.  Crystal in detail. He agrees.  The patient follow-up with me in approximately 2 months.  All questions were answered. The patient knows to call the clinic with any problems, questions or concerns. I reviewed the images myself. The images with the patient and family in detail.   I spent 30 minutes counseling the patient face to face. The total time spent in the appointment was 60 minutes and more than 50% was on counseling.     Cammie Sickle, MD 02/15/2015 12:45 PM

## 2015-02-15 NOTE — Patient Instructions (Addendum)
Steps to Quit Smoking  Smoking tobacco can be harmful to your health and can affect almost every organ in your body. Smoking puts you, and those around you, at risk for developing many serious chronic diseases. Quitting smoking is difficult, but it is one of the best things that you can do for your health. It is never too late to quit. WHAT ARE THE BENEFITS OF QUITTING SMOKING? When you quit smoking, you lower your risk of developing serious diseases and conditions, such as:  Lung cancer or lung disease, such as COPD.  Heart disease.  Stroke.  Heart attack.  Infertility.  Osteoporosis and bone fractures. Additionally, symptoms such as coughing, wheezing, and shortness of breath may get better when you quit. You may also find that you get sick less often because your body is stronger at fighting off colds and infections. If you are pregnant, quitting smoking can help to reduce your chances of having a baby of low birth weight. HOW DO I GET READY TO QUIT? When you decide to quit smoking, create a plan to make sure that you are successful. Before you quit:  Pick a date to quit. Set a date within the next two weeks to give you time to prepare.  Write down the reasons why you are quitting. Keep this list in places where you will see it often, such as on your bathroom mirror or in your car or wallet.  Identify the people, places, things, and activities that make you want to smoke (triggers) and avoid them. Make sure to take these actions:  Throw away all cigarettes at home, at work, and in your car.  Throw away smoking accessories, such as ashtrays and lighters.  Clean your car and make sure to empty the ashtray.  Clean your home, including curtains and carpets.  Tell your family, friends, and coworkers that you are quitting. Support from your loved ones can make quitting easier.  Talk with your health care provider about your options for quitting smoking.  Find out what treatment  options are covered by your health insurance. WHAT STRATEGIES CAN I USE TO QUIT SMOKING?  Talk with your healthcare provider about different strategies to quit smoking. Some strategies include:  Quitting smoking altogether instead of gradually lessening how much you smoke over a period of time. Research shows that quitting "cold turkey" is more successful than gradually quitting.  Attending in-person counseling to help you build problem-solving skills. You are more likely to have success in quitting if you attend several counseling sessions. Even short sessions of 10 minutes can be effective.  Finding resources and support systems that can help you to quit smoking and remain smoke-free after you quit. These resources are most helpful when you use them often. They can include:  Online chats with a counselor.  Telephone quitlines.  Printed self-help materials.  Support groups or group counseling.  Text messaging programs.  Mobile phone applications.  Taking medicines to help you quit smoking. (If you are pregnant or breastfeeding, talk with your health care provider first.) Some medicines contain nicotine and some do not. Both types of medicines help with cravings, but the medicines that include nicotine help to relieve withdrawal symptoms. Your health care provider may recommend:  Nicotine patches, gum, or lozenges.  Nicotine inhalers or sprays.  Non-nicotine medicine that is taken by mouth. Talk with your health care provider about combining strategies, such as taking medicines while you are also receiving in-person counseling. Using these two strategies together makes   you more likely to succeed in quitting than if you used either strategy on its own. If you are pregnant or breastfeeding, talk with your health care provider about finding counseling or other support strategies to quit smoking. Do not take medicine to help you quit smoking unless told to do so by your health care  provider. WHAT THINGS CAN I DO TO MAKE IT EASIER TO QUIT? Quitting smoking might feel overwhelming at first, but there is a lot that you can do to make it easier. Take these important actions:  Reach out to your family and friends and ask that they support and encourage you during this time. Call telephone quitlines, reach out to support groups, or work with a counselor for support.  Ask people who smoke to avoid smoking around you.  Avoid places that trigger you to smoke, such as bars, parties, or smoke-break areas at work.  Spend time around people who do not smoke.  Lessen stress in your life, because stress can be a smoking trigger for some people. To lessen stress, try:  Exercising regularly.  Deep-breathing exercises.  Yoga.  Meditating.  Performing a body scan. This involves closing your eyes, scanning your body from head to toe, and noticing which parts of your body are particularly tense. Purposefully relax the muscles in those areas.  Download or purchase mobile phone or tablet apps (applications) that can help you stick to your quit plan by providing reminders, tips, and encouragement. There are many free apps, such as QuitGuide from the CDC (Centers for Disease Control and Prevention). You can find other support for quitting smoking (smoking cessation) through smokefree.gov and other websites. HOW WILL I FEEL WHEN I QUIT SMOKING? Within the first 24 hours of quitting smoking, you may start to feel some withdrawal symptoms. These symptoms are usually most noticeable 2-3 days after quitting, but they usually do not last beyond 2-3 weeks. Changes or symptoms that you might experience include:  Mood swings.  Restlessness, anxiety, or irritation.  Difficulty concentrating.  Dizziness.  Strong cravings for sugary foods in addition to nicotine.  Mild weight gain.  Constipation.  Nausea.  Coughing or a sore throat.  Changes in how your medicines work in your  body.  A depressed mood.  Difficulty sleeping (insomnia). After the first 2-3 weeks of quitting, you may start to notice more positive results, such as:  Improved sense of smell and taste.  Decreased coughing and sore throat.  Slower heart rate.  Lower blood pressure.  Clearer skin.  The ability to breathe more easily.  Fewer sick days. Quitting smoking is very challenging for most people. Do not get discouraged if you are not successful the first time. Some people need to make many attempts to quit before they achieve long-term success. Do your best to stick to your quit plan, and talk with your health care provider if you have any questions or concerns.   This information is not intended to replace advice given to you by your health care provider. Make sure you discuss any questions you have with your health care provider.   Document Released: 04/16/2001 Document Revised: 09/06/2014 Document Reviewed: 09/06/2014 Elsevier Interactive Patient Education 2016 [employer]  Smoking Cessation, Tips for Success If you are ready to quit smoking, congratulations! You have chosen to help yourself be healthier. Cigarettes bring nicotine, tar, carbon monoxide, and other irritants into your body. Your lungs, heart, and blood vessels will be able to work better without these poisons. There are   many different ways to quit smoking. Nicotine gum, nicotine patches, a nicotine inhaler, or nicotine nasal spray can help with physical craving. Hypnosis, support groups, and medicines help break the habit of smoking. WHAT THINGS CAN I DO TO MAKE QUITTING EASIER?  Here are some tips to help you quit for good:  Pick a date when you will quit smoking completely. Tell all of your friends and family about your plan to quit on that date.  Do not try to slowly cut down on the number of cigarettes you are smoking. Pick a quit date and quit smoking completely starting on that day.  Throw away all cigarettes.    Clean and remove all ashtrays from your home, work, and car.  On a card, write down your reasons for quitting. Carry the card with you and read it when you get the urge to smoke.  Cleanse your body of nicotine. Drink enough water and fluids to keep your urine clear or pale yellow. Do this after quitting to flush the nicotine from your body.  Learn to predict your moods. Do not let a bad situation be your excuse to have a cigarette. Some situations in your life might tempt you into wanting a cigarette.  Never have "just one" cigarette. It leads to wanting another and another. Remind yourself of your decision to quit.  Change habits associated with smoking. If you smoked while driving or when feeling stressed, try other activities to replace smoking. Stand up when drinking your coffee. Brush your teeth after eating. Sit in a different chair when you read the paper. Avoid alcohol while trying to quit, and try to drink fewer caffeinated beverages. Alcohol and caffeine may urge you to smoke.  Avoid foods and drinks that can trigger a desire to smoke, such as sugary or spicy foods and alcohol.  Ask people who smoke not to smoke around you.  Have something planned to do right after eating or having a cup of coffee. For example, plan to take a walk or exercise.  Try a relaxation exercise to calm you down and decrease your stress. Remember, you may be tense and nervous for the first 2 weeks after you quit, but this will pass.  Find new activities to keep your hands busy. Play with a pen, coin, or rubber band. Doodle or draw things on paper.  Brush your teeth right after eating. This will help cut down on the craving for the taste of tobacco after meals. You can also try mouthwash.   Use oral substitutes in place of cigarettes. Try using lemon drops, carrots, cinnamon sticks, or chewing gum. Keep them handy so they are available when you have the urge to smoke.  When you have the urge to smoke,  try deep breathing.  Designate your home as a nonsmoking area.  If you are a heavy smoker, ask your health care provider about a prescription for nicotine chewing gum. It can ease your withdrawal from nicotine.  Reward yourself. Set aside the cigarette money you save and buy yourself something nice.  Look for support from others. Join a support group or smoking cessation program. Ask someone at home or at work to help you with your plan to quit smoking.  Always ask yourself, "Do I need this cigarette or is this just a reflex?" Tell yourself, "Today, I choose not to smoke," or "I do not want to smoke." You are reminding yourself of your decision to quit.  Do not replace cigarette smoking with   electronic cigarettes (commonly called e-cigarettes). The safety of e-cigarettes is unknown, and some may contain harmful chemicals.  If you relapse, do not give up! Plan ahead and think about what you will do the next time you get the urge to smoke. HOW WILL I FEEL WHEN I QUIT SMOKING? You may have symptoms of withdrawal because your body is used to nicotine (the addictive substance in cigarettes). You may crave cigarettes, be irritable, feel very hungry, cough often, get headaches, or have difficulty concentrating. The withdrawal symptoms are only temporary. They are strongest when you first quit but will go away within 10-14 days. When withdrawal symptoms occur, stay in control. Think about your reasons for quitting. Remind yourself that these are signs that your body is healing and getting used to being without cigarettes. Remember that withdrawal symptoms are easier to treat than the major diseases that smoking can cause.  Even after the withdrawal is over, expect periodic urges to smoke. However, these cravings are generally short lived and will go away whether you smoke or not. Do not smoke! WHAT RESOURCES ARE AVAILABLE TO HELP ME QUIT SMOKING? Your health care provider can direct you to community  resources or hospitals for support, which may include:  Group support.  Education.  Hypnosis.  Therapy.   This information is not intended to replace advice given to you by your health care provider. Make sure you discuss any questions you have with your health care provider.   Document Released: 01/19/2004 Document Revised: 05/13/2014 Document Reviewed: 10/08/2012 Elsevier Interactive Patient Education 2016 Elsevier Inc.   

## 2015-02-16 ENCOUNTER — Telehealth: Payer: Self-pay | Admitting: *Deleted

## 2015-02-16 NOTE — Telephone Encounter (Signed)
Contacted patient to followup on recent appointments, review plan of care, and answer any questions that patient may have. Patient is doing well and needs no other support at this time.

## 2015-02-20 ENCOUNTER — Ambulatory Visit
Admission: RE | Admit: 2015-02-20 | Discharge: 2015-02-20 | Disposition: A | Discharge status: Home / Self Care | Payer: Medicare Other | Source: Ambulatory Visit | Attending: Radiation Oncology | Admitting: Radiation Oncology

## 2015-02-20 DIAGNOSIS — C3401 Malignant neoplasm of right main bronchus: Secondary | ICD-10-CM | POA: Diagnosis not present

## 2015-02-20 DIAGNOSIS — F329 Major depressive disorder, single episode, unspecified: Secondary | ICD-10-CM | POA: Diagnosis not present

## 2015-02-20 DIAGNOSIS — M8580 Other specified disorders of bone density and structure, unspecified site: Secondary | ICD-10-CM | POA: Diagnosis not present

## 2015-02-20 DIAGNOSIS — F17210 Nicotine dependence, cigarettes, uncomplicated: Secondary | ICD-10-CM | POA: Diagnosis not present

## 2015-02-20 DIAGNOSIS — Z510 Encounter for antineoplastic radiation therapy: Secondary | ICD-10-CM | POA: Diagnosis not present

## 2015-02-20 DIAGNOSIS — F419 Anxiety disorder, unspecified: Secondary | ICD-10-CM | POA: Diagnosis not present

## 2015-02-20 DIAGNOSIS — J449 Chronic obstructive pulmonary disease, unspecified: Secondary | ICD-10-CM | POA: Diagnosis not present

## 2015-02-20 DIAGNOSIS — J45909 Unspecified asthma, uncomplicated: Secondary | ICD-10-CM | POA: Diagnosis not present

## 2015-02-20 DIAGNOSIS — Z79899 Other long term (current) drug therapy: Secondary | ICD-10-CM | POA: Diagnosis not present

## 2015-02-20 DIAGNOSIS — Z7982 Long term (current) use of aspirin: Secondary | ICD-10-CM | POA: Diagnosis not present

## 2015-02-20 DIAGNOSIS — K219 Gastro-esophageal reflux disease without esophagitis: Secondary | ICD-10-CM | POA: Diagnosis not present

## 2015-02-20 DIAGNOSIS — C3431 Malignant neoplasm of lower lobe, right bronchus or lung: Secondary | ICD-10-CM | POA: Diagnosis not present

## 2015-02-23 ENCOUNTER — Other Ambulatory Visit: Payer: Self-pay | Admitting: Pulmonary Disease

## 2015-02-23 DIAGNOSIS — C3431 Malignant neoplasm of lower lobe, right bronchus or lung: Secondary | ICD-10-CM | POA: Diagnosis not present

## 2015-02-23 DIAGNOSIS — J449 Chronic obstructive pulmonary disease, unspecified: Secondary | ICD-10-CM | POA: Diagnosis not present

## 2015-02-23 DIAGNOSIS — Z7982 Long term (current) use of aspirin: Secondary | ICD-10-CM | POA: Diagnosis not present

## 2015-02-23 DIAGNOSIS — F329 Major depressive disorder, single episode, unspecified: Secondary | ICD-10-CM | POA: Diagnosis not present

## 2015-02-23 DIAGNOSIS — F419 Anxiety disorder, unspecified: Secondary | ICD-10-CM | POA: Diagnosis not present

## 2015-02-23 DIAGNOSIS — C3401 Malignant neoplasm of right main bronchus: Secondary | ICD-10-CM | POA: Diagnosis not present

## 2015-02-23 DIAGNOSIS — J45909 Unspecified asthma, uncomplicated: Secondary | ICD-10-CM | POA: Diagnosis not present

## 2015-02-23 DIAGNOSIS — F17210 Nicotine dependence, cigarettes, uncomplicated: Secondary | ICD-10-CM | POA: Diagnosis not present

## 2015-02-23 DIAGNOSIS — K219 Gastro-esophageal reflux disease without esophagitis: Secondary | ICD-10-CM | POA: Diagnosis not present

## 2015-02-23 DIAGNOSIS — Z510 Encounter for antineoplastic radiation therapy: Secondary | ICD-10-CM | POA: Diagnosis not present

## 2015-02-23 DIAGNOSIS — M8580 Other specified disorders of bone density and structure, unspecified site: Secondary | ICD-10-CM | POA: Diagnosis not present

## 2015-02-23 DIAGNOSIS — Z79899 Other long term (current) drug therapy: Secondary | ICD-10-CM | POA: Diagnosis not present

## 2015-02-27 NOTE — Telephone Encounter (Signed)
°  STAT if patient is at the pharmacy , call can be transferred to refill team.   1. Which medications need to be refilled? [place]   2. Which pharmacy/location is medication to be sent to? [facility] on [address]   3. Do they need a 30 day or 90 day supply? 60 day

## 2015-02-28 ENCOUNTER — Other Ambulatory Visit: Payer: Self-pay | Admitting: *Deleted

## 2015-03-01 ENCOUNTER — Ambulatory Visit
Admission: RE | Admit: 2015-03-01 | Discharge: 2015-03-01 | Disposition: A | Discharge status: Home / Self Care | Payer: Medicare Other | Source: Ambulatory Visit | Attending: Radiation Oncology | Admitting: Radiation Oncology

## 2015-03-01 DIAGNOSIS — Z510 Encounter for antineoplastic radiation therapy: Secondary | ICD-10-CM | POA: Diagnosis not present

## 2015-03-01 DIAGNOSIS — K219 Gastro-esophageal reflux disease without esophagitis: Secondary | ICD-10-CM | POA: Diagnosis not present

## 2015-03-01 DIAGNOSIS — J45909 Unspecified asthma, uncomplicated: Secondary | ICD-10-CM | POA: Diagnosis not present

## 2015-03-01 DIAGNOSIS — Z79899 Other long term (current) drug therapy: Secondary | ICD-10-CM | POA: Diagnosis not present

## 2015-03-01 DIAGNOSIS — C3431 Malignant neoplasm of lower lobe, right bronchus or lung: Secondary | ICD-10-CM | POA: Diagnosis not present

## 2015-03-01 DIAGNOSIS — Z7982 Long term (current) use of aspirin: Secondary | ICD-10-CM | POA: Diagnosis not present

## 2015-03-01 DIAGNOSIS — F17210 Nicotine dependence, cigarettes, uncomplicated: Secondary | ICD-10-CM | POA: Diagnosis not present

## 2015-03-01 DIAGNOSIS — C3401 Malignant neoplasm of right main bronchus: Secondary | ICD-10-CM | POA: Diagnosis not present

## 2015-03-01 DIAGNOSIS — J449 Chronic obstructive pulmonary disease, unspecified: Secondary | ICD-10-CM | POA: Diagnosis not present

## 2015-03-01 DIAGNOSIS — F419 Anxiety disorder, unspecified: Secondary | ICD-10-CM | POA: Diagnosis not present

## 2015-03-01 DIAGNOSIS — M8580 Other specified disorders of bone density and structure, unspecified site: Secondary | ICD-10-CM | POA: Diagnosis not present

## 2015-03-01 DIAGNOSIS — F329 Major depressive disorder, single episode, unspecified: Secondary | ICD-10-CM | POA: Diagnosis not present

## 2015-03-02 ENCOUNTER — Ambulatory Visit
Admission: RE | Admit: 2015-03-02 | Discharge: 2015-03-02 | Disposition: A | Discharge status: Home / Self Care | Payer: Medicare Other | Source: Ambulatory Visit | Attending: Radiation Oncology | Admitting: Radiation Oncology

## 2015-03-02 DIAGNOSIS — K219 Gastro-esophageal reflux disease without esophagitis: Secondary | ICD-10-CM | POA: Diagnosis not present

## 2015-03-02 DIAGNOSIS — F419 Anxiety disorder, unspecified: Secondary | ICD-10-CM | POA: Diagnosis not present

## 2015-03-02 DIAGNOSIS — C3431 Malignant neoplasm of lower lobe, right bronchus or lung: Secondary | ICD-10-CM | POA: Diagnosis not present

## 2015-03-02 DIAGNOSIS — Z510 Encounter for antineoplastic radiation therapy: Secondary | ICD-10-CM | POA: Diagnosis not present

## 2015-03-02 DIAGNOSIS — M8580 Other specified disorders of bone density and structure, unspecified site: Secondary | ICD-10-CM | POA: Diagnosis not present

## 2015-03-02 DIAGNOSIS — F17210 Nicotine dependence, cigarettes, uncomplicated: Secondary | ICD-10-CM | POA: Diagnosis not present

## 2015-03-02 DIAGNOSIS — J45909 Unspecified asthma, uncomplicated: Secondary | ICD-10-CM | POA: Diagnosis not present

## 2015-03-02 DIAGNOSIS — Z7982 Long term (current) use of aspirin: Secondary | ICD-10-CM | POA: Diagnosis not present

## 2015-03-02 DIAGNOSIS — J449 Chronic obstructive pulmonary disease, unspecified: Secondary | ICD-10-CM | POA: Diagnosis not present

## 2015-03-02 DIAGNOSIS — Z79899 Other long term (current) drug therapy: Secondary | ICD-10-CM | POA: Diagnosis not present

## 2015-03-02 DIAGNOSIS — C3401 Malignant neoplasm of right main bronchus: Secondary | ICD-10-CM | POA: Diagnosis not present

## 2015-03-02 DIAGNOSIS — F329 Major depressive disorder, single episode, unspecified: Secondary | ICD-10-CM | POA: Diagnosis not present

## 2015-03-03 ENCOUNTER — Ambulatory Visit
Admission: RE | Admit: 2015-03-03 | Discharge: 2015-03-03 | Disposition: A | Discharge status: Home / Self Care | Payer: Medicare Other | Source: Ambulatory Visit | Attending: Radiation Oncology | Admitting: Radiation Oncology

## 2015-03-03 DIAGNOSIS — C3401 Malignant neoplasm of right main bronchus: Secondary | ICD-10-CM | POA: Diagnosis not present

## 2015-03-03 DIAGNOSIS — K219 Gastro-esophageal reflux disease without esophagitis: Secondary | ICD-10-CM | POA: Diagnosis not present

## 2015-03-03 DIAGNOSIS — F329 Major depressive disorder, single episode, unspecified: Secondary | ICD-10-CM | POA: Diagnosis not present

## 2015-03-03 DIAGNOSIS — Z79899 Other long term (current) drug therapy: Secondary | ICD-10-CM | POA: Diagnosis not present

## 2015-03-03 DIAGNOSIS — J45909 Unspecified asthma, uncomplicated: Secondary | ICD-10-CM | POA: Diagnosis not present

## 2015-03-03 DIAGNOSIS — M8580 Other specified disorders of bone density and structure, unspecified site: Secondary | ICD-10-CM | POA: Diagnosis not present

## 2015-03-03 DIAGNOSIS — F17210 Nicotine dependence, cigarettes, uncomplicated: Secondary | ICD-10-CM | POA: Diagnosis not present

## 2015-03-03 DIAGNOSIS — Z7982 Long term (current) use of aspirin: Secondary | ICD-10-CM | POA: Diagnosis not present

## 2015-03-03 DIAGNOSIS — Z510 Encounter for antineoplastic radiation therapy: Secondary | ICD-10-CM | POA: Diagnosis not present

## 2015-03-03 DIAGNOSIS — C3431 Malignant neoplasm of lower lobe, right bronchus or lung: Secondary | ICD-10-CM | POA: Diagnosis not present

## 2015-03-03 DIAGNOSIS — F419 Anxiety disorder, unspecified: Secondary | ICD-10-CM | POA: Diagnosis not present

## 2015-03-03 DIAGNOSIS — J449 Chronic obstructive pulmonary disease, unspecified: Secondary | ICD-10-CM | POA: Diagnosis not present

## 2015-03-05 ENCOUNTER — Ambulatory Visit
Admission: RE | Admit: 2015-03-05 | Discharge: 2015-03-05 | Disposition: A | Discharge status: Home / Self Care | Payer: Medicare Other | Source: Ambulatory Visit | Attending: Radiation Oncology | Admitting: Radiation Oncology

## 2015-03-06 ENCOUNTER — Ambulatory Visit
Admission: RE | Admit: 2015-03-06 | Discharge: 2015-03-06 | Disposition: A | Discharge status: Home / Self Care | Payer: Medicare Other | Source: Ambulatory Visit | Attending: Radiation Oncology | Admitting: Radiation Oncology

## 2015-03-06 DIAGNOSIS — Z7982 Long term (current) use of aspirin: Secondary | ICD-10-CM | POA: Diagnosis not present

## 2015-03-06 DIAGNOSIS — J45909 Unspecified asthma, uncomplicated: Secondary | ICD-10-CM | POA: Diagnosis not present

## 2015-03-06 DIAGNOSIS — F419 Anxiety disorder, unspecified: Secondary | ICD-10-CM | POA: Diagnosis not present

## 2015-03-06 DIAGNOSIS — C3431 Malignant neoplasm of lower lobe, right bronchus or lung: Secondary | ICD-10-CM | POA: Diagnosis not present

## 2015-03-06 DIAGNOSIS — K219 Gastro-esophageal reflux disease without esophagitis: Secondary | ICD-10-CM | POA: Diagnosis not present

## 2015-03-06 DIAGNOSIS — M8580 Other specified disorders of bone density and structure, unspecified site: Secondary | ICD-10-CM | POA: Diagnosis not present

## 2015-03-06 DIAGNOSIS — J449 Chronic obstructive pulmonary disease, unspecified: Secondary | ICD-10-CM | POA: Diagnosis not present

## 2015-03-06 DIAGNOSIS — F17210 Nicotine dependence, cigarettes, uncomplicated: Secondary | ICD-10-CM | POA: Diagnosis not present

## 2015-03-06 DIAGNOSIS — C3401 Malignant neoplasm of right main bronchus: Secondary | ICD-10-CM | POA: Diagnosis not present

## 2015-03-06 DIAGNOSIS — Z79899 Other long term (current) drug therapy: Secondary | ICD-10-CM | POA: Diagnosis not present

## 2015-03-06 DIAGNOSIS — F329 Major depressive disorder, single episode, unspecified: Secondary | ICD-10-CM | POA: Diagnosis not present

## 2015-03-06 DIAGNOSIS — Z510 Encounter for antineoplastic radiation therapy: Secondary | ICD-10-CM | POA: Diagnosis not present

## 2015-03-07 ENCOUNTER — Ambulatory Visit
Admission: RE | Admit: 2015-03-07 | Discharge: 2015-03-07 | Disposition: A | Discharge status: Home / Self Care | Payer: Medicare Other | Source: Ambulatory Visit | Attending: Radiation Oncology | Admitting: Radiation Oncology

## 2015-03-07 DIAGNOSIS — Z7982 Long term (current) use of aspirin: Secondary | ICD-10-CM | POA: Diagnosis not present

## 2015-03-07 DIAGNOSIS — C3431 Malignant neoplasm of lower lobe, right bronchus or lung: Secondary | ICD-10-CM | POA: Diagnosis not present

## 2015-03-07 DIAGNOSIS — Z79899 Other long term (current) drug therapy: Secondary | ICD-10-CM | POA: Diagnosis not present

## 2015-03-07 DIAGNOSIS — C3401 Malignant neoplasm of right main bronchus: Secondary | ICD-10-CM | POA: Diagnosis not present

## 2015-03-07 DIAGNOSIS — F17210 Nicotine dependence, cigarettes, uncomplicated: Secondary | ICD-10-CM | POA: Diagnosis not present

## 2015-03-07 DIAGNOSIS — F329 Major depressive disorder, single episode, unspecified: Secondary | ICD-10-CM | POA: Diagnosis not present

## 2015-03-07 DIAGNOSIS — F419 Anxiety disorder, unspecified: Secondary | ICD-10-CM | POA: Diagnosis not present

## 2015-03-07 DIAGNOSIS — Z510 Encounter for antineoplastic radiation therapy: Secondary | ICD-10-CM | POA: Diagnosis not present

## 2015-03-07 DIAGNOSIS — J449 Chronic obstructive pulmonary disease, unspecified: Secondary | ICD-10-CM | POA: Diagnosis not present

## 2015-03-07 DIAGNOSIS — K219 Gastro-esophageal reflux disease without esophagitis: Secondary | ICD-10-CM | POA: Diagnosis not present

## 2015-03-07 DIAGNOSIS — J45909 Unspecified asthma, uncomplicated: Secondary | ICD-10-CM | POA: Diagnosis not present

## 2015-03-07 DIAGNOSIS — M8580 Other specified disorders of bone density and structure, unspecified site: Secondary | ICD-10-CM | POA: Diagnosis not present

## 2015-03-08 ENCOUNTER — Ambulatory Visit
Admission: RE | Admit: 2015-03-08 | Discharge: 2015-03-08 | Disposition: A | Discharge status: Home / Self Care | Payer: Medicare Other | Source: Ambulatory Visit | Attending: Radiation Oncology | Admitting: Radiation Oncology

## 2015-03-08 DIAGNOSIS — C3431 Malignant neoplasm of lower lobe, right bronchus or lung: Secondary | ICD-10-CM | POA: Diagnosis not present

## 2015-03-08 DIAGNOSIS — F17210 Nicotine dependence, cigarettes, uncomplicated: Secondary | ICD-10-CM | POA: Diagnosis not present

## 2015-03-08 DIAGNOSIS — C3401 Malignant neoplasm of right main bronchus: Secondary | ICD-10-CM | POA: Diagnosis not present

## 2015-03-08 DIAGNOSIS — Z79899 Other long term (current) drug therapy: Secondary | ICD-10-CM | POA: Diagnosis not present

## 2015-03-08 DIAGNOSIS — M8580 Other specified disorders of bone density and structure, unspecified site: Secondary | ICD-10-CM | POA: Diagnosis not present

## 2015-03-08 DIAGNOSIS — F329 Major depressive disorder, single episode, unspecified: Secondary | ICD-10-CM | POA: Diagnosis not present

## 2015-03-08 DIAGNOSIS — Z7982 Long term (current) use of aspirin: Secondary | ICD-10-CM | POA: Diagnosis not present

## 2015-03-08 DIAGNOSIS — J45909 Unspecified asthma, uncomplicated: Secondary | ICD-10-CM | POA: Diagnosis not present

## 2015-03-08 DIAGNOSIS — J449 Chronic obstructive pulmonary disease, unspecified: Secondary | ICD-10-CM | POA: Diagnosis not present

## 2015-03-08 DIAGNOSIS — Z510 Encounter for antineoplastic radiation therapy: Secondary | ICD-10-CM | POA: Diagnosis not present

## 2015-03-08 DIAGNOSIS — K219 Gastro-esophageal reflux disease without esophagitis: Secondary | ICD-10-CM | POA: Diagnosis not present

## 2015-03-08 DIAGNOSIS — F419 Anxiety disorder, unspecified: Secondary | ICD-10-CM | POA: Diagnosis not present

## 2015-03-09 ENCOUNTER — Ambulatory Visit
Admission: RE | Admit: 2015-03-09 | Discharge: 2015-03-09 | Disposition: A | Discharge status: Home / Self Care | Payer: Medicare Other | Source: Ambulatory Visit | Attending: Radiation Oncology | Admitting: Radiation Oncology

## 2015-03-09 DIAGNOSIS — J45909 Unspecified asthma, uncomplicated: Secondary | ICD-10-CM | POA: Diagnosis not present

## 2015-03-09 DIAGNOSIS — C3431 Malignant neoplasm of lower lobe, right bronchus or lung: Secondary | ICD-10-CM | POA: Diagnosis not present

## 2015-03-09 DIAGNOSIS — J449 Chronic obstructive pulmonary disease, unspecified: Secondary | ICD-10-CM | POA: Diagnosis not present

## 2015-03-09 DIAGNOSIS — Z510 Encounter for antineoplastic radiation therapy: Secondary | ICD-10-CM | POA: Diagnosis not present

## 2015-03-09 DIAGNOSIS — F17210 Nicotine dependence, cigarettes, uncomplicated: Secondary | ICD-10-CM | POA: Diagnosis not present

## 2015-03-09 DIAGNOSIS — Z79899 Other long term (current) drug therapy: Secondary | ICD-10-CM | POA: Diagnosis not present

## 2015-03-09 DIAGNOSIS — K219 Gastro-esophageal reflux disease without esophagitis: Secondary | ICD-10-CM | POA: Diagnosis not present

## 2015-03-09 DIAGNOSIS — C3401 Malignant neoplasm of right main bronchus: Secondary | ICD-10-CM | POA: Diagnosis not present

## 2015-03-09 DIAGNOSIS — M8580 Other specified disorders of bone density and structure, unspecified site: Secondary | ICD-10-CM | POA: Diagnosis not present

## 2015-03-09 DIAGNOSIS — F419 Anxiety disorder, unspecified: Secondary | ICD-10-CM | POA: Diagnosis not present

## 2015-03-09 DIAGNOSIS — F329 Major depressive disorder, single episode, unspecified: Secondary | ICD-10-CM | POA: Diagnosis not present

## 2015-03-09 DIAGNOSIS — Z7982 Long term (current) use of aspirin: Secondary | ICD-10-CM | POA: Diagnosis not present

## 2015-03-10 ENCOUNTER — Ambulatory Visit
Admission: RE | Admit: 2015-03-10 | Discharge: 2015-03-10 | Disposition: A | Discharge status: Home / Self Care | Payer: Medicare Other | Source: Ambulatory Visit | Attending: Radiation Oncology | Admitting: Radiation Oncology

## 2015-03-10 DIAGNOSIS — J449 Chronic obstructive pulmonary disease, unspecified: Secondary | ICD-10-CM | POA: Diagnosis not present

## 2015-03-10 DIAGNOSIS — J45909 Unspecified asthma, uncomplicated: Secondary | ICD-10-CM | POA: Diagnosis not present

## 2015-03-10 DIAGNOSIS — Z7982 Long term (current) use of aspirin: Secondary | ICD-10-CM | POA: Diagnosis not present

## 2015-03-10 DIAGNOSIS — M8580 Other specified disorders of bone density and structure, unspecified site: Secondary | ICD-10-CM | POA: Diagnosis not present

## 2015-03-10 DIAGNOSIS — C3401 Malignant neoplasm of right main bronchus: Secondary | ICD-10-CM | POA: Diagnosis not present

## 2015-03-10 DIAGNOSIS — K219 Gastro-esophageal reflux disease without esophagitis: Secondary | ICD-10-CM | POA: Diagnosis not present

## 2015-03-10 DIAGNOSIS — F329 Major depressive disorder, single episode, unspecified: Secondary | ICD-10-CM | POA: Diagnosis not present

## 2015-03-10 DIAGNOSIS — Z510 Encounter for antineoplastic radiation therapy: Secondary | ICD-10-CM | POA: Diagnosis not present

## 2015-03-10 DIAGNOSIS — Z79899 Other long term (current) drug therapy: Secondary | ICD-10-CM | POA: Diagnosis not present

## 2015-03-10 DIAGNOSIS — C3431 Malignant neoplasm of lower lobe, right bronchus or lung: Secondary | ICD-10-CM | POA: Diagnosis not present

## 2015-03-10 DIAGNOSIS — F419 Anxiety disorder, unspecified: Secondary | ICD-10-CM | POA: Diagnosis not present

## 2015-03-10 DIAGNOSIS — F17210 Nicotine dependence, cigarettes, uncomplicated: Secondary | ICD-10-CM | POA: Diagnosis not present

## 2015-03-13 ENCOUNTER — Ambulatory Visit
Admission: RE | Admit: 2015-03-13 | Discharge: 2015-03-13 | Disposition: A | Discharge status: Home / Self Care | Payer: Medicare Other | Source: Ambulatory Visit | Attending: Radiation Oncology | Admitting: Radiation Oncology

## 2015-03-13 DIAGNOSIS — J45909 Unspecified asthma, uncomplicated: Secondary | ICD-10-CM | POA: Diagnosis not present

## 2015-03-13 DIAGNOSIS — F17210 Nicotine dependence, cigarettes, uncomplicated: Secondary | ICD-10-CM | POA: Diagnosis not present

## 2015-03-13 DIAGNOSIS — Z510 Encounter for antineoplastic radiation therapy: Secondary | ICD-10-CM | POA: Diagnosis not present

## 2015-03-13 DIAGNOSIS — C3431 Malignant neoplasm of lower lobe, right bronchus or lung: Secondary | ICD-10-CM | POA: Diagnosis not present

## 2015-03-13 DIAGNOSIS — K219 Gastro-esophageal reflux disease without esophagitis: Secondary | ICD-10-CM | POA: Diagnosis not present

## 2015-03-13 DIAGNOSIS — J449 Chronic obstructive pulmonary disease, unspecified: Secondary | ICD-10-CM | POA: Diagnosis not present

## 2015-03-13 DIAGNOSIS — F419 Anxiety disorder, unspecified: Secondary | ICD-10-CM | POA: Diagnosis not present

## 2015-03-13 DIAGNOSIS — Z7982 Long term (current) use of aspirin: Secondary | ICD-10-CM | POA: Diagnosis not present

## 2015-03-13 DIAGNOSIS — Z79899 Other long term (current) drug therapy: Secondary | ICD-10-CM | POA: Diagnosis not present

## 2015-03-13 DIAGNOSIS — M8580 Other specified disorders of bone density and structure, unspecified site: Secondary | ICD-10-CM | POA: Diagnosis not present

## 2015-03-13 DIAGNOSIS — C3401 Malignant neoplasm of right main bronchus: Secondary | ICD-10-CM | POA: Diagnosis not present

## 2015-03-13 DIAGNOSIS — F329 Major depressive disorder, single episode, unspecified: Secondary | ICD-10-CM | POA: Diagnosis not present

## 2015-03-14 ENCOUNTER — Ambulatory Visit
Admission: RE | Admit: 2015-03-14 | Discharge: 2015-03-14 | Disposition: A | Discharge status: Home / Self Care | Payer: Medicare Other | Source: Ambulatory Visit | Attending: Radiation Oncology | Admitting: Radiation Oncology

## 2015-03-14 DIAGNOSIS — Z510 Encounter for antineoplastic radiation therapy: Secondary | ICD-10-CM | POA: Diagnosis not present

## 2015-03-14 DIAGNOSIS — Z7982 Long term (current) use of aspirin: Secondary | ICD-10-CM | POA: Diagnosis not present

## 2015-03-14 DIAGNOSIS — C3431 Malignant neoplasm of lower lobe, right bronchus or lung: Secondary | ICD-10-CM | POA: Diagnosis not present

## 2015-03-14 DIAGNOSIS — J449 Chronic obstructive pulmonary disease, unspecified: Secondary | ICD-10-CM | POA: Diagnosis not present

## 2015-03-14 DIAGNOSIS — Z79899 Other long term (current) drug therapy: Secondary | ICD-10-CM | POA: Diagnosis not present

## 2015-03-14 DIAGNOSIS — F17210 Nicotine dependence, cigarettes, uncomplicated: Secondary | ICD-10-CM | POA: Diagnosis not present

## 2015-03-14 DIAGNOSIS — F329 Major depressive disorder, single episode, unspecified: Secondary | ICD-10-CM | POA: Diagnosis not present

## 2015-03-14 DIAGNOSIS — K219 Gastro-esophageal reflux disease without esophagitis: Secondary | ICD-10-CM | POA: Diagnosis not present

## 2015-03-14 DIAGNOSIS — F419 Anxiety disorder, unspecified: Secondary | ICD-10-CM | POA: Diagnosis not present

## 2015-03-14 DIAGNOSIS — C3401 Malignant neoplasm of right main bronchus: Secondary | ICD-10-CM | POA: Diagnosis not present

## 2015-03-14 DIAGNOSIS — M8580 Other specified disorders of bone density and structure, unspecified site: Secondary | ICD-10-CM | POA: Diagnosis not present

## 2015-03-14 DIAGNOSIS — J45909 Unspecified asthma, uncomplicated: Secondary | ICD-10-CM | POA: Diagnosis not present

## 2015-03-15 ENCOUNTER — Ambulatory Visit
Admission: RE | Admit: 2015-03-15 | Discharge: 2015-03-15 | Disposition: A | Discharge status: Home / Self Care | Payer: Medicare Other | Source: Ambulatory Visit | Attending: Radiation Oncology | Admitting: Radiation Oncology

## 2015-03-15 DIAGNOSIS — Z79899 Other long term (current) drug therapy: Secondary | ICD-10-CM | POA: Diagnosis not present

## 2015-03-15 DIAGNOSIS — J449 Chronic obstructive pulmonary disease, unspecified: Secondary | ICD-10-CM | POA: Diagnosis not present

## 2015-03-15 DIAGNOSIS — Z7982 Long term (current) use of aspirin: Secondary | ICD-10-CM | POA: Diagnosis not present

## 2015-03-15 DIAGNOSIS — C3431 Malignant neoplasm of lower lobe, right bronchus or lung: Secondary | ICD-10-CM | POA: Diagnosis not present

## 2015-03-15 DIAGNOSIS — M8580 Other specified disorders of bone density and structure, unspecified site: Secondary | ICD-10-CM | POA: Diagnosis not present

## 2015-03-15 DIAGNOSIS — C3401 Malignant neoplasm of right main bronchus: Secondary | ICD-10-CM | POA: Diagnosis not present

## 2015-03-15 DIAGNOSIS — F329 Major depressive disorder, single episode, unspecified: Secondary | ICD-10-CM | POA: Diagnosis not present

## 2015-03-15 DIAGNOSIS — F419 Anxiety disorder, unspecified: Secondary | ICD-10-CM | POA: Diagnosis not present

## 2015-03-15 DIAGNOSIS — Z510 Encounter for antineoplastic radiation therapy: Secondary | ICD-10-CM | POA: Diagnosis not present

## 2015-03-15 DIAGNOSIS — K219 Gastro-esophageal reflux disease without esophagitis: Secondary | ICD-10-CM | POA: Diagnosis not present

## 2015-03-15 DIAGNOSIS — J45909 Unspecified asthma, uncomplicated: Secondary | ICD-10-CM | POA: Diagnosis not present

## 2015-03-15 DIAGNOSIS — F17210 Nicotine dependence, cigarettes, uncomplicated: Secondary | ICD-10-CM | POA: Diagnosis not present

## 2015-03-16 ENCOUNTER — Ambulatory Visit
Admission: RE | Admit: 2015-03-16 | Discharge: 2015-03-16 | Disposition: A | Discharge status: Home / Self Care | Payer: Medicare Other | Source: Ambulatory Visit | Attending: Radiation Oncology | Admitting: Radiation Oncology

## 2015-03-16 DIAGNOSIS — Z510 Encounter for antineoplastic radiation therapy: Secondary | ICD-10-CM | POA: Diagnosis not present

## 2015-03-16 DIAGNOSIS — M8580 Other specified disorders of bone density and structure, unspecified site: Secondary | ICD-10-CM | POA: Diagnosis not present

## 2015-03-16 DIAGNOSIS — Z7982 Long term (current) use of aspirin: Secondary | ICD-10-CM | POA: Diagnosis not present

## 2015-03-16 DIAGNOSIS — C3401 Malignant neoplasm of right main bronchus: Secondary | ICD-10-CM | POA: Diagnosis not present

## 2015-03-16 DIAGNOSIS — C3431 Malignant neoplasm of lower lobe, right bronchus or lung: Secondary | ICD-10-CM | POA: Diagnosis not present

## 2015-03-16 DIAGNOSIS — F329 Major depressive disorder, single episode, unspecified: Secondary | ICD-10-CM | POA: Diagnosis not present

## 2015-03-16 DIAGNOSIS — K219 Gastro-esophageal reflux disease without esophagitis: Secondary | ICD-10-CM | POA: Diagnosis not present

## 2015-03-16 DIAGNOSIS — J45909 Unspecified asthma, uncomplicated: Secondary | ICD-10-CM | POA: Diagnosis not present

## 2015-03-16 DIAGNOSIS — Z79899 Other long term (current) drug therapy: Secondary | ICD-10-CM | POA: Diagnosis not present

## 2015-03-16 DIAGNOSIS — J449 Chronic obstructive pulmonary disease, unspecified: Secondary | ICD-10-CM | POA: Diagnosis not present

## 2015-03-16 DIAGNOSIS — F419 Anxiety disorder, unspecified: Secondary | ICD-10-CM | POA: Diagnosis not present

## 2015-03-16 DIAGNOSIS — F17210 Nicotine dependence, cigarettes, uncomplicated: Secondary | ICD-10-CM | POA: Diagnosis not present

## 2015-03-17 ENCOUNTER — Ambulatory Visit
Admission: RE | Admit: 2015-03-17 | Discharge: 2015-03-17 | Disposition: A | Discharge status: Home / Self Care | Payer: Medicare Other | Source: Ambulatory Visit | Attending: Radiation Oncology | Admitting: Radiation Oncology

## 2015-03-17 DIAGNOSIS — J449 Chronic obstructive pulmonary disease, unspecified: Secondary | ICD-10-CM | POA: Diagnosis not present

## 2015-03-17 DIAGNOSIS — C3431 Malignant neoplasm of lower lobe, right bronchus or lung: Secondary | ICD-10-CM | POA: Diagnosis not present

## 2015-03-17 DIAGNOSIS — Z79899 Other long term (current) drug therapy: Secondary | ICD-10-CM | POA: Diagnosis not present

## 2015-03-17 DIAGNOSIS — F329 Major depressive disorder, single episode, unspecified: Secondary | ICD-10-CM | POA: Diagnosis not present

## 2015-03-17 DIAGNOSIS — F419 Anxiety disorder, unspecified: Secondary | ICD-10-CM | POA: Diagnosis not present

## 2015-03-17 DIAGNOSIS — Z510 Encounter for antineoplastic radiation therapy: Secondary | ICD-10-CM | POA: Diagnosis not present

## 2015-03-17 DIAGNOSIS — M8580 Other specified disorders of bone density and structure, unspecified site: Secondary | ICD-10-CM | POA: Diagnosis not present

## 2015-03-17 DIAGNOSIS — F17210 Nicotine dependence, cigarettes, uncomplicated: Secondary | ICD-10-CM | POA: Diagnosis not present

## 2015-03-17 DIAGNOSIS — K219 Gastro-esophageal reflux disease without esophagitis: Secondary | ICD-10-CM | POA: Diagnosis not present

## 2015-03-17 DIAGNOSIS — C3401 Malignant neoplasm of right main bronchus: Secondary | ICD-10-CM | POA: Diagnosis not present

## 2015-03-17 DIAGNOSIS — Z7982 Long term (current) use of aspirin: Secondary | ICD-10-CM | POA: Diagnosis not present

## 2015-03-17 DIAGNOSIS — J45909 Unspecified asthma, uncomplicated: Secondary | ICD-10-CM | POA: Diagnosis not present

## 2015-03-20 ENCOUNTER — Ambulatory Visit
Admission: RE | Admit: 2015-03-20 | Discharge: 2015-03-20 | Disposition: A | Discharge status: Home / Self Care | Payer: Medicare Other | Source: Ambulatory Visit | Attending: Radiation Oncology | Admitting: Radiation Oncology

## 2015-03-20 DIAGNOSIS — J45909 Unspecified asthma, uncomplicated: Secondary | ICD-10-CM | POA: Diagnosis not present

## 2015-03-20 DIAGNOSIS — F17210 Nicotine dependence, cigarettes, uncomplicated: Secondary | ICD-10-CM | POA: Diagnosis not present

## 2015-03-20 DIAGNOSIS — K219 Gastro-esophageal reflux disease without esophagitis: Secondary | ICD-10-CM | POA: Diagnosis not present

## 2015-03-20 DIAGNOSIS — F329 Major depressive disorder, single episode, unspecified: Secondary | ICD-10-CM | POA: Diagnosis not present

## 2015-03-20 DIAGNOSIS — Z7982 Long term (current) use of aspirin: Secondary | ICD-10-CM | POA: Diagnosis not present

## 2015-03-20 DIAGNOSIS — F419 Anxiety disorder, unspecified: Secondary | ICD-10-CM | POA: Diagnosis not present

## 2015-03-20 DIAGNOSIS — C3431 Malignant neoplasm of lower lobe, right bronchus or lung: Secondary | ICD-10-CM | POA: Diagnosis not present

## 2015-03-20 DIAGNOSIS — Z79899 Other long term (current) drug therapy: Secondary | ICD-10-CM | POA: Diagnosis not present

## 2015-03-20 DIAGNOSIS — Z510 Encounter for antineoplastic radiation therapy: Secondary | ICD-10-CM | POA: Diagnosis not present

## 2015-03-20 DIAGNOSIS — C3401 Malignant neoplasm of right main bronchus: Secondary | ICD-10-CM | POA: Diagnosis not present

## 2015-03-20 DIAGNOSIS — M8580 Other specified disorders of bone density and structure, unspecified site: Secondary | ICD-10-CM | POA: Diagnosis not present

## 2015-03-20 DIAGNOSIS — J449 Chronic obstructive pulmonary disease, unspecified: Secondary | ICD-10-CM | POA: Diagnosis not present

## 2015-03-21 ENCOUNTER — Ambulatory Visit
Admission: RE | Admit: 2015-03-21 | Discharge: 2015-03-21 | Disposition: A | Discharge status: Home / Self Care | Payer: Medicare Other | Source: Ambulatory Visit | Attending: Radiation Oncology | Admitting: Radiation Oncology

## 2015-03-21 DIAGNOSIS — F329 Major depressive disorder, single episode, unspecified: Secondary | ICD-10-CM | POA: Diagnosis not present

## 2015-03-21 DIAGNOSIS — Z79899 Other long term (current) drug therapy: Secondary | ICD-10-CM | POA: Diagnosis not present

## 2015-03-21 DIAGNOSIS — K219 Gastro-esophageal reflux disease without esophagitis: Secondary | ICD-10-CM | POA: Diagnosis not present

## 2015-03-21 DIAGNOSIS — C3431 Malignant neoplasm of lower lobe, right bronchus or lung: Secondary | ICD-10-CM | POA: Diagnosis not present

## 2015-03-21 DIAGNOSIS — C3401 Malignant neoplasm of right main bronchus: Secondary | ICD-10-CM | POA: Diagnosis not present

## 2015-03-21 DIAGNOSIS — Z510 Encounter for antineoplastic radiation therapy: Secondary | ICD-10-CM | POA: Diagnosis not present

## 2015-03-21 DIAGNOSIS — J449 Chronic obstructive pulmonary disease, unspecified: Secondary | ICD-10-CM | POA: Diagnosis not present

## 2015-03-21 DIAGNOSIS — F17210 Nicotine dependence, cigarettes, uncomplicated: Secondary | ICD-10-CM | POA: Diagnosis not present

## 2015-03-21 DIAGNOSIS — M8580 Other specified disorders of bone density and structure, unspecified site: Secondary | ICD-10-CM | POA: Diagnosis not present

## 2015-03-21 DIAGNOSIS — Z7982 Long term (current) use of aspirin: Secondary | ICD-10-CM | POA: Diagnosis not present

## 2015-03-21 DIAGNOSIS — J45909 Unspecified asthma, uncomplicated: Secondary | ICD-10-CM | POA: Diagnosis not present

## 2015-03-21 DIAGNOSIS — F419 Anxiety disorder, unspecified: Secondary | ICD-10-CM | POA: Diagnosis not present

## 2015-03-22 ENCOUNTER — Ambulatory Visit
Admission: RE | Admit: 2015-03-22 | Discharge: 2015-03-22 | Disposition: A | Discharge status: Home / Self Care | Payer: Medicare Other | Source: Ambulatory Visit | Attending: Radiation Oncology | Admitting: Radiation Oncology

## 2015-03-22 DIAGNOSIS — F329 Major depressive disorder, single episode, unspecified: Secondary | ICD-10-CM | POA: Diagnosis not present

## 2015-03-22 DIAGNOSIS — Z7982 Long term (current) use of aspirin: Secondary | ICD-10-CM | POA: Diagnosis not present

## 2015-03-22 DIAGNOSIS — K219 Gastro-esophageal reflux disease without esophagitis: Secondary | ICD-10-CM | POA: Diagnosis not present

## 2015-03-22 DIAGNOSIS — J449 Chronic obstructive pulmonary disease, unspecified: Secondary | ICD-10-CM | POA: Diagnosis not present

## 2015-03-22 DIAGNOSIS — F17210 Nicotine dependence, cigarettes, uncomplicated: Secondary | ICD-10-CM | POA: Diagnosis not present

## 2015-03-22 DIAGNOSIS — Z510 Encounter for antineoplastic radiation therapy: Secondary | ICD-10-CM | POA: Diagnosis not present

## 2015-03-22 DIAGNOSIS — M8580 Other specified disorders of bone density and structure, unspecified site: Secondary | ICD-10-CM | POA: Diagnosis not present

## 2015-03-22 DIAGNOSIS — C3401 Malignant neoplasm of right main bronchus: Secondary | ICD-10-CM | POA: Diagnosis not present

## 2015-03-22 DIAGNOSIS — Z79899 Other long term (current) drug therapy: Secondary | ICD-10-CM | POA: Diagnosis not present

## 2015-03-22 DIAGNOSIS — J45909 Unspecified asthma, uncomplicated: Secondary | ICD-10-CM | POA: Diagnosis not present

## 2015-03-22 DIAGNOSIS — F419 Anxiety disorder, unspecified: Secondary | ICD-10-CM | POA: Diagnosis not present

## 2015-03-22 DIAGNOSIS — C3431 Malignant neoplasm of lower lobe, right bronchus or lung: Secondary | ICD-10-CM | POA: Diagnosis not present

## 2015-03-23 ENCOUNTER — Ambulatory Visit
Admission: RE | Admit: 2015-03-23 | Discharge: 2015-03-23 | Disposition: A | Discharge status: Home / Self Care | Payer: Medicare Other | Source: Ambulatory Visit | Attending: Radiation Oncology | Admitting: Radiation Oncology

## 2015-03-23 DIAGNOSIS — C3431 Malignant neoplasm of lower lobe, right bronchus or lung: Secondary | ICD-10-CM | POA: Diagnosis not present

## 2015-03-23 DIAGNOSIS — F17210 Nicotine dependence, cigarettes, uncomplicated: Secondary | ICD-10-CM | POA: Diagnosis not present

## 2015-03-23 DIAGNOSIS — F419 Anxiety disorder, unspecified: Secondary | ICD-10-CM | POA: Diagnosis not present

## 2015-03-23 DIAGNOSIS — C3401 Malignant neoplasm of right main bronchus: Secondary | ICD-10-CM | POA: Diagnosis not present

## 2015-03-23 DIAGNOSIS — J45909 Unspecified asthma, uncomplicated: Secondary | ICD-10-CM | POA: Diagnosis not present

## 2015-03-23 DIAGNOSIS — F329 Major depressive disorder, single episode, unspecified: Secondary | ICD-10-CM | POA: Diagnosis not present

## 2015-03-23 DIAGNOSIS — J449 Chronic obstructive pulmonary disease, unspecified: Secondary | ICD-10-CM | POA: Diagnosis not present

## 2015-03-23 DIAGNOSIS — M8580 Other specified disorders of bone density and structure, unspecified site: Secondary | ICD-10-CM | POA: Diagnosis not present

## 2015-03-23 DIAGNOSIS — K219 Gastro-esophageal reflux disease without esophagitis: Secondary | ICD-10-CM | POA: Diagnosis not present

## 2015-03-23 DIAGNOSIS — Z7982 Long term (current) use of aspirin: Secondary | ICD-10-CM | POA: Diagnosis not present

## 2015-03-23 DIAGNOSIS — Z79899 Other long term (current) drug therapy: Secondary | ICD-10-CM | POA: Diagnosis not present

## 2015-03-23 DIAGNOSIS — Z510 Encounter for antineoplastic radiation therapy: Secondary | ICD-10-CM | POA: Diagnosis not present

## 2015-03-24 ENCOUNTER — Ambulatory Visit
Admission: RE | Admit: 2015-03-24 | Discharge: 2015-03-24 | Disposition: A | Discharge status: Home / Self Care | Payer: Medicare Other | Source: Ambulatory Visit | Attending: Radiation Oncology | Admitting: Radiation Oncology

## 2015-03-24 DIAGNOSIS — C3401 Malignant neoplasm of right main bronchus: Secondary | ICD-10-CM | POA: Diagnosis not present

## 2015-03-24 DIAGNOSIS — K219 Gastro-esophageal reflux disease without esophagitis: Secondary | ICD-10-CM | POA: Diagnosis not present

## 2015-03-24 DIAGNOSIS — M8580 Other specified disorders of bone density and structure, unspecified site: Secondary | ICD-10-CM | POA: Diagnosis not present

## 2015-03-24 DIAGNOSIS — J45909 Unspecified asthma, uncomplicated: Secondary | ICD-10-CM | POA: Diagnosis not present

## 2015-03-24 DIAGNOSIS — F17210 Nicotine dependence, cigarettes, uncomplicated: Secondary | ICD-10-CM | POA: Diagnosis not present

## 2015-03-24 DIAGNOSIS — Z7982 Long term (current) use of aspirin: Secondary | ICD-10-CM | POA: Diagnosis not present

## 2015-03-24 DIAGNOSIS — C3431 Malignant neoplasm of lower lobe, right bronchus or lung: Secondary | ICD-10-CM | POA: Diagnosis not present

## 2015-03-24 DIAGNOSIS — J449 Chronic obstructive pulmonary disease, unspecified: Secondary | ICD-10-CM | POA: Diagnosis not present

## 2015-03-24 DIAGNOSIS — F419 Anxiety disorder, unspecified: Secondary | ICD-10-CM | POA: Diagnosis not present

## 2015-03-24 DIAGNOSIS — F329 Major depressive disorder, single episode, unspecified: Secondary | ICD-10-CM | POA: Diagnosis not present

## 2015-03-24 DIAGNOSIS — Z79899 Other long term (current) drug therapy: Secondary | ICD-10-CM | POA: Diagnosis not present

## 2015-03-24 DIAGNOSIS — Z510 Encounter for antineoplastic radiation therapy: Secondary | ICD-10-CM | POA: Diagnosis not present

## 2015-03-27 ENCOUNTER — Ambulatory Visit
Admission: RE | Admit: 2015-03-27 | Discharge: 2015-03-27 | Disposition: A | Discharge status: Home / Self Care | Payer: Medicare Other | Source: Ambulatory Visit | Attending: Radiation Oncology | Admitting: Radiation Oncology

## 2015-03-27 DIAGNOSIS — M8580 Other specified disorders of bone density and structure, unspecified site: Secondary | ICD-10-CM | POA: Diagnosis not present

## 2015-03-27 DIAGNOSIS — F17210 Nicotine dependence, cigarettes, uncomplicated: Secondary | ICD-10-CM | POA: Diagnosis not present

## 2015-03-27 DIAGNOSIS — J449 Chronic obstructive pulmonary disease, unspecified: Secondary | ICD-10-CM | POA: Diagnosis not present

## 2015-03-27 DIAGNOSIS — Z510 Encounter for antineoplastic radiation therapy: Secondary | ICD-10-CM | POA: Diagnosis not present

## 2015-03-27 DIAGNOSIS — F419 Anxiety disorder, unspecified: Secondary | ICD-10-CM | POA: Diagnosis not present

## 2015-03-27 DIAGNOSIS — C3431 Malignant neoplasm of lower lobe, right bronchus or lung: Secondary | ICD-10-CM | POA: Diagnosis not present

## 2015-03-27 DIAGNOSIS — C3401 Malignant neoplasm of right main bronchus: Secondary | ICD-10-CM | POA: Diagnosis not present

## 2015-03-27 DIAGNOSIS — K219 Gastro-esophageal reflux disease without esophagitis: Secondary | ICD-10-CM | POA: Diagnosis not present

## 2015-03-27 DIAGNOSIS — Z79899 Other long term (current) drug therapy: Secondary | ICD-10-CM | POA: Diagnosis not present

## 2015-03-27 DIAGNOSIS — F329 Major depressive disorder, single episode, unspecified: Secondary | ICD-10-CM | POA: Diagnosis not present

## 2015-03-27 DIAGNOSIS — Z7982 Long term (current) use of aspirin: Secondary | ICD-10-CM | POA: Diagnosis not present

## 2015-03-27 DIAGNOSIS — J45909 Unspecified asthma, uncomplicated: Secondary | ICD-10-CM | POA: Diagnosis not present

## 2015-03-28 ENCOUNTER — Ambulatory Visit
Admission: RE | Admit: 2015-03-28 | Discharge: 2015-03-28 | Disposition: A | Discharge status: Home / Self Care | Payer: Medicare Other | Source: Ambulatory Visit | Attending: Radiation Oncology | Admitting: Radiation Oncology

## 2015-03-28 DIAGNOSIS — M8580 Other specified disorders of bone density and structure, unspecified site: Secondary | ICD-10-CM | POA: Diagnosis not present

## 2015-03-28 DIAGNOSIS — Z7982 Long term (current) use of aspirin: Secondary | ICD-10-CM | POA: Diagnosis not present

## 2015-03-28 DIAGNOSIS — C3431 Malignant neoplasm of lower lobe, right bronchus or lung: Secondary | ICD-10-CM | POA: Diagnosis not present

## 2015-03-28 DIAGNOSIS — Z510 Encounter for antineoplastic radiation therapy: Secondary | ICD-10-CM | POA: Diagnosis not present

## 2015-03-28 DIAGNOSIS — C3401 Malignant neoplasm of right main bronchus: Secondary | ICD-10-CM | POA: Diagnosis not present

## 2015-03-28 DIAGNOSIS — F329 Major depressive disorder, single episode, unspecified: Secondary | ICD-10-CM | POA: Diagnosis not present

## 2015-03-28 DIAGNOSIS — J45909 Unspecified asthma, uncomplicated: Secondary | ICD-10-CM | POA: Diagnosis not present

## 2015-03-28 DIAGNOSIS — F419 Anxiety disorder, unspecified: Secondary | ICD-10-CM | POA: Diagnosis not present

## 2015-03-28 DIAGNOSIS — Z79899 Other long term (current) drug therapy: Secondary | ICD-10-CM | POA: Diagnosis not present

## 2015-03-28 DIAGNOSIS — F17210 Nicotine dependence, cigarettes, uncomplicated: Secondary | ICD-10-CM | POA: Diagnosis not present

## 2015-03-28 DIAGNOSIS — J449 Chronic obstructive pulmonary disease, unspecified: Secondary | ICD-10-CM | POA: Diagnosis not present

## 2015-03-28 DIAGNOSIS — K219 Gastro-esophageal reflux disease without esophagitis: Secondary | ICD-10-CM | POA: Diagnosis not present

## 2015-03-29 ENCOUNTER — Ambulatory Visit
Admission: RE | Admit: 2015-03-29 | Discharge: 2015-03-29 | Disposition: A | Discharge status: Home / Self Care | Payer: Medicare Other | Source: Ambulatory Visit | Attending: Radiation Oncology | Admitting: Radiation Oncology

## 2015-03-29 DIAGNOSIS — Z7982 Long term (current) use of aspirin: Secondary | ICD-10-CM | POA: Diagnosis not present

## 2015-03-29 DIAGNOSIS — F17210 Nicotine dependence, cigarettes, uncomplicated: Secondary | ICD-10-CM | POA: Diagnosis not present

## 2015-03-29 DIAGNOSIS — Z79899 Other long term (current) drug therapy: Secondary | ICD-10-CM | POA: Diagnosis not present

## 2015-03-29 DIAGNOSIS — F329 Major depressive disorder, single episode, unspecified: Secondary | ICD-10-CM | POA: Diagnosis not present

## 2015-03-29 DIAGNOSIS — J449 Chronic obstructive pulmonary disease, unspecified: Secondary | ICD-10-CM | POA: Diagnosis not present

## 2015-03-29 DIAGNOSIS — Z510 Encounter for antineoplastic radiation therapy: Secondary | ICD-10-CM | POA: Diagnosis not present

## 2015-03-29 DIAGNOSIS — C3401 Malignant neoplasm of right main bronchus: Secondary | ICD-10-CM | POA: Diagnosis not present

## 2015-03-29 DIAGNOSIS — M8580 Other specified disorders of bone density and structure, unspecified site: Secondary | ICD-10-CM | POA: Diagnosis not present

## 2015-03-29 DIAGNOSIS — J45909 Unspecified asthma, uncomplicated: Secondary | ICD-10-CM | POA: Diagnosis not present

## 2015-03-29 DIAGNOSIS — C3431 Malignant neoplasm of lower lobe, right bronchus or lung: Secondary | ICD-10-CM | POA: Diagnosis not present

## 2015-03-29 DIAGNOSIS — F419 Anxiety disorder, unspecified: Secondary | ICD-10-CM | POA: Diagnosis not present

## 2015-03-29 DIAGNOSIS — K219 Gastro-esophageal reflux disease without esophagitis: Secondary | ICD-10-CM | POA: Diagnosis not present

## 2015-04-03 ENCOUNTER — Ambulatory Visit
Admission: RE | Admit: 2015-04-03 | Discharge: 2015-04-03 | Disposition: A | Discharge status: Home / Self Care | Payer: Medicare Other | Source: Ambulatory Visit | Attending: Radiation Oncology | Admitting: Radiation Oncology

## 2015-04-03 DIAGNOSIS — M8580 Other specified disorders of bone density and structure, unspecified site: Secondary | ICD-10-CM | POA: Diagnosis not present

## 2015-04-03 DIAGNOSIS — J449 Chronic obstructive pulmonary disease, unspecified: Secondary | ICD-10-CM | POA: Diagnosis not present

## 2015-04-03 DIAGNOSIS — C3401 Malignant neoplasm of right main bronchus: Secondary | ICD-10-CM | POA: Diagnosis not present

## 2015-04-03 DIAGNOSIS — F17210 Nicotine dependence, cigarettes, uncomplicated: Secondary | ICD-10-CM | POA: Diagnosis not present

## 2015-04-03 DIAGNOSIS — Z79899 Other long term (current) drug therapy: Secondary | ICD-10-CM | POA: Diagnosis not present

## 2015-04-03 DIAGNOSIS — Z7982 Long term (current) use of aspirin: Secondary | ICD-10-CM | POA: Diagnosis not present

## 2015-04-03 DIAGNOSIS — F419 Anxiety disorder, unspecified: Secondary | ICD-10-CM | POA: Diagnosis not present

## 2015-04-03 DIAGNOSIS — C3431 Malignant neoplasm of lower lobe, right bronchus or lung: Secondary | ICD-10-CM | POA: Diagnosis not present

## 2015-04-03 DIAGNOSIS — J45909 Unspecified asthma, uncomplicated: Secondary | ICD-10-CM | POA: Diagnosis not present

## 2015-04-03 DIAGNOSIS — Z510 Encounter for antineoplastic radiation therapy: Secondary | ICD-10-CM | POA: Diagnosis not present

## 2015-04-03 DIAGNOSIS — F329 Major depressive disorder, single episode, unspecified: Secondary | ICD-10-CM | POA: Diagnosis not present

## 2015-04-03 DIAGNOSIS — K219 Gastro-esophageal reflux disease without esophagitis: Secondary | ICD-10-CM | POA: Diagnosis not present

## 2015-04-04 ENCOUNTER — Ambulatory Visit
Admission: RE | Admit: 2015-04-04 | Discharge: 2015-04-04 | Disposition: A | Discharge status: Home / Self Care | Payer: Medicare Other | Source: Ambulatory Visit | Attending: Radiation Oncology | Admitting: Radiation Oncology

## 2015-04-04 DIAGNOSIS — C3401 Malignant neoplasm of right main bronchus: Secondary | ICD-10-CM | POA: Diagnosis not present

## 2015-04-04 DIAGNOSIS — K219 Gastro-esophageal reflux disease without esophagitis: Secondary | ICD-10-CM | POA: Diagnosis not present

## 2015-04-04 DIAGNOSIS — Z7982 Long term (current) use of aspirin: Secondary | ICD-10-CM | POA: Diagnosis not present

## 2015-04-04 DIAGNOSIS — F329 Major depressive disorder, single episode, unspecified: Secondary | ICD-10-CM | POA: Diagnosis not present

## 2015-04-04 DIAGNOSIS — J449 Chronic obstructive pulmonary disease, unspecified: Secondary | ICD-10-CM | POA: Diagnosis not present

## 2015-04-04 DIAGNOSIS — M8580 Other specified disorders of bone density and structure, unspecified site: Secondary | ICD-10-CM | POA: Diagnosis not present

## 2015-04-04 DIAGNOSIS — F17210 Nicotine dependence, cigarettes, uncomplicated: Secondary | ICD-10-CM | POA: Diagnosis not present

## 2015-04-04 DIAGNOSIS — F419 Anxiety disorder, unspecified: Secondary | ICD-10-CM | POA: Diagnosis not present

## 2015-04-04 DIAGNOSIS — C3431 Malignant neoplasm of lower lobe, right bronchus or lung: Secondary | ICD-10-CM | POA: Diagnosis not present

## 2015-04-04 DIAGNOSIS — J45909 Unspecified asthma, uncomplicated: Secondary | ICD-10-CM | POA: Diagnosis not present

## 2015-04-04 DIAGNOSIS — Z510 Encounter for antineoplastic radiation therapy: Secondary | ICD-10-CM | POA: Diagnosis not present

## 2015-04-04 DIAGNOSIS — Z79899 Other long term (current) drug therapy: Secondary | ICD-10-CM | POA: Diagnosis not present

## 2015-04-05 ENCOUNTER — Ambulatory Visit
Admission: RE | Admit: 2015-04-05 | Discharge: 2015-04-05 | Disposition: A | Discharge status: Home / Self Care | Payer: Medicare Other | Source: Ambulatory Visit | Attending: Radiation Oncology | Admitting: Radiation Oncology

## 2015-04-05 DIAGNOSIS — K219 Gastro-esophageal reflux disease without esophagitis: Secondary | ICD-10-CM | POA: Diagnosis not present

## 2015-04-05 DIAGNOSIS — F17210 Nicotine dependence, cigarettes, uncomplicated: Secondary | ICD-10-CM | POA: Diagnosis not present

## 2015-04-05 DIAGNOSIS — J45909 Unspecified asthma, uncomplicated: Secondary | ICD-10-CM | POA: Diagnosis not present

## 2015-04-05 DIAGNOSIS — M8580 Other specified disorders of bone density and structure, unspecified site: Secondary | ICD-10-CM | POA: Diagnosis not present

## 2015-04-05 DIAGNOSIS — F329 Major depressive disorder, single episode, unspecified: Secondary | ICD-10-CM | POA: Diagnosis not present

## 2015-04-05 DIAGNOSIS — Z79899 Other long term (current) drug therapy: Secondary | ICD-10-CM | POA: Diagnosis not present

## 2015-04-05 DIAGNOSIS — J449 Chronic obstructive pulmonary disease, unspecified: Secondary | ICD-10-CM | POA: Diagnosis not present

## 2015-04-05 DIAGNOSIS — Z510 Encounter for antineoplastic radiation therapy: Secondary | ICD-10-CM | POA: Diagnosis not present

## 2015-04-05 DIAGNOSIS — Z7982 Long term (current) use of aspirin: Secondary | ICD-10-CM | POA: Diagnosis not present

## 2015-04-05 DIAGNOSIS — C3431 Malignant neoplasm of lower lobe, right bronchus or lung: Secondary | ICD-10-CM | POA: Diagnosis not present

## 2015-04-05 DIAGNOSIS — C3401 Malignant neoplasm of right main bronchus: Secondary | ICD-10-CM | POA: Diagnosis not present

## 2015-04-05 DIAGNOSIS — F419 Anxiety disorder, unspecified: Secondary | ICD-10-CM | POA: Diagnosis not present

## 2015-04-06 ENCOUNTER — Ambulatory Visit
Admission: RE | Admit: 2015-04-06 | Discharge: 2015-04-06 | Disposition: A | Discharge status: Home / Self Care | Payer: Medicare Other | Source: Ambulatory Visit | Attending: Radiation Oncology | Admitting: Radiation Oncology

## 2015-04-06 DIAGNOSIS — F17210 Nicotine dependence, cigarettes, uncomplicated: Secondary | ICD-10-CM | POA: Diagnosis not present

## 2015-04-06 DIAGNOSIS — Z510 Encounter for antineoplastic radiation therapy: Secondary | ICD-10-CM | POA: Diagnosis not present

## 2015-04-06 DIAGNOSIS — Z7982 Long term (current) use of aspirin: Secondary | ICD-10-CM | POA: Diagnosis not present

## 2015-04-06 DIAGNOSIS — Z79899 Other long term (current) drug therapy: Secondary | ICD-10-CM | POA: Diagnosis not present

## 2015-04-06 DIAGNOSIS — F419 Anxiety disorder, unspecified: Secondary | ICD-10-CM | POA: Diagnosis not present

## 2015-04-06 DIAGNOSIS — F329 Major depressive disorder, single episode, unspecified: Secondary | ICD-10-CM | POA: Diagnosis not present

## 2015-04-06 DIAGNOSIS — C3401 Malignant neoplasm of right main bronchus: Secondary | ICD-10-CM | POA: Diagnosis not present

## 2015-04-06 DIAGNOSIS — K219 Gastro-esophageal reflux disease without esophagitis: Secondary | ICD-10-CM | POA: Diagnosis not present

## 2015-04-06 DIAGNOSIS — J449 Chronic obstructive pulmonary disease, unspecified: Secondary | ICD-10-CM | POA: Diagnosis not present

## 2015-04-06 DIAGNOSIS — J45909 Unspecified asthma, uncomplicated: Secondary | ICD-10-CM | POA: Diagnosis not present

## 2015-04-06 DIAGNOSIS — M8580 Other specified disorders of bone density and structure, unspecified site: Secondary | ICD-10-CM | POA: Diagnosis not present

## 2015-04-06 DIAGNOSIS — C3431 Malignant neoplasm of lower lobe, right bronchus or lung: Secondary | ICD-10-CM | POA: Diagnosis not present

## 2015-04-07 ENCOUNTER — Ambulatory Visit
Admission: RE | Admit: 2015-04-07 | Discharge: 2015-04-07 | Disposition: A | Discharge status: Home / Self Care | Payer: Medicare Other | Source: Ambulatory Visit | Attending: Radiation Oncology | Admitting: Radiation Oncology

## 2015-04-07 DIAGNOSIS — M8580 Other specified disorders of bone density and structure, unspecified site: Secondary | ICD-10-CM | POA: Diagnosis not present

## 2015-04-07 DIAGNOSIS — F419 Anxiety disorder, unspecified: Secondary | ICD-10-CM | POA: Diagnosis not present

## 2015-04-07 DIAGNOSIS — Z7982 Long term (current) use of aspirin: Secondary | ICD-10-CM | POA: Diagnosis not present

## 2015-04-07 DIAGNOSIS — J45909 Unspecified asthma, uncomplicated: Secondary | ICD-10-CM | POA: Diagnosis not present

## 2015-04-07 DIAGNOSIS — F17210 Nicotine dependence, cigarettes, uncomplicated: Secondary | ICD-10-CM | POA: Diagnosis not present

## 2015-04-07 DIAGNOSIS — C3401 Malignant neoplasm of right main bronchus: Secondary | ICD-10-CM | POA: Diagnosis not present

## 2015-04-07 DIAGNOSIS — K219 Gastro-esophageal reflux disease without esophagitis: Secondary | ICD-10-CM | POA: Diagnosis not present

## 2015-04-07 DIAGNOSIS — Z79899 Other long term (current) drug therapy: Secondary | ICD-10-CM | POA: Diagnosis not present

## 2015-04-07 DIAGNOSIS — F329 Major depressive disorder, single episode, unspecified: Secondary | ICD-10-CM | POA: Diagnosis not present

## 2015-04-07 DIAGNOSIS — C3431 Malignant neoplasm of lower lobe, right bronchus or lung: Secondary | ICD-10-CM | POA: Diagnosis not present

## 2015-04-07 DIAGNOSIS — Z510 Encounter for antineoplastic radiation therapy: Secondary | ICD-10-CM | POA: Diagnosis not present

## 2015-04-07 DIAGNOSIS — J449 Chronic obstructive pulmonary disease, unspecified: Secondary | ICD-10-CM | POA: Diagnosis not present

## 2015-04-09 DIAGNOSIS — J449 Chronic obstructive pulmonary disease, unspecified: Secondary | ICD-10-CM | POA: Diagnosis not present

## 2015-04-10 ENCOUNTER — Ambulatory Visit
Admission: RE | Admit: 2015-04-10 | Discharge: 2015-04-10 | Disposition: A | Discharge status: Home / Self Care | Payer: Medicare Other | Source: Ambulatory Visit | Attending: Radiation Oncology | Admitting: Radiation Oncology

## 2015-04-10 DIAGNOSIS — F17210 Nicotine dependence, cigarettes, uncomplicated: Secondary | ICD-10-CM | POA: Diagnosis not present

## 2015-04-10 DIAGNOSIS — F419 Anxiety disorder, unspecified: Secondary | ICD-10-CM | POA: Diagnosis not present

## 2015-04-10 DIAGNOSIS — J449 Chronic obstructive pulmonary disease, unspecified: Secondary | ICD-10-CM | POA: Diagnosis not present

## 2015-04-10 DIAGNOSIS — K219 Gastro-esophageal reflux disease without esophagitis: Secondary | ICD-10-CM | POA: Diagnosis not present

## 2015-04-10 DIAGNOSIS — M8580 Other specified disorders of bone density and structure, unspecified site: Secondary | ICD-10-CM | POA: Diagnosis not present

## 2015-04-10 DIAGNOSIS — J45909 Unspecified asthma, uncomplicated: Secondary | ICD-10-CM | POA: Diagnosis not present

## 2015-04-10 DIAGNOSIS — Z7982 Long term (current) use of aspirin: Secondary | ICD-10-CM | POA: Diagnosis not present

## 2015-04-10 DIAGNOSIS — Z510 Encounter for antineoplastic radiation therapy: Secondary | ICD-10-CM | POA: Diagnosis not present

## 2015-04-10 DIAGNOSIS — F329 Major depressive disorder, single episode, unspecified: Secondary | ICD-10-CM | POA: Diagnosis not present

## 2015-04-10 DIAGNOSIS — Z79899 Other long term (current) drug therapy: Secondary | ICD-10-CM | POA: Diagnosis not present

## 2015-04-10 DIAGNOSIS — C3401 Malignant neoplasm of right main bronchus: Secondary | ICD-10-CM | POA: Diagnosis not present

## 2015-04-10 DIAGNOSIS — C3431 Malignant neoplasm of lower lobe, right bronchus or lung: Secondary | ICD-10-CM | POA: Diagnosis not present

## 2015-04-11 ENCOUNTER — Ambulatory Visit
Admission: RE | Admit: 2015-04-11 | Discharge: 2015-04-11 | Disposition: A | Discharge status: Home / Self Care | Payer: Medicare Other | Source: Ambulatory Visit | Attending: Radiation Oncology | Admitting: Radiation Oncology

## 2015-04-11 ENCOUNTER — Other Ambulatory Visit: Payer: Self-pay | Admitting: *Deleted

## 2015-04-11 DIAGNOSIS — F419 Anxiety disorder, unspecified: Secondary | ICD-10-CM | POA: Diagnosis not present

## 2015-04-11 DIAGNOSIS — M8580 Other specified disorders of bone density and structure, unspecified site: Secondary | ICD-10-CM | POA: Diagnosis not present

## 2015-04-11 DIAGNOSIS — F17210 Nicotine dependence, cigarettes, uncomplicated: Secondary | ICD-10-CM | POA: Diagnosis not present

## 2015-04-11 DIAGNOSIS — K219 Gastro-esophageal reflux disease without esophagitis: Secondary | ICD-10-CM | POA: Diagnosis not present

## 2015-04-11 DIAGNOSIS — J449 Chronic obstructive pulmonary disease, unspecified: Secondary | ICD-10-CM | POA: Diagnosis not present

## 2015-04-11 DIAGNOSIS — Z7982 Long term (current) use of aspirin: Secondary | ICD-10-CM | POA: Diagnosis not present

## 2015-04-11 DIAGNOSIS — C3431 Malignant neoplasm of lower lobe, right bronchus or lung: Secondary | ICD-10-CM | POA: Diagnosis not present

## 2015-04-11 DIAGNOSIS — J45909 Unspecified asthma, uncomplicated: Secondary | ICD-10-CM | POA: Diagnosis not present

## 2015-04-11 DIAGNOSIS — Z510 Encounter for antineoplastic radiation therapy: Secondary | ICD-10-CM | POA: Diagnosis not present

## 2015-04-11 DIAGNOSIS — Z79899 Other long term (current) drug therapy: Secondary | ICD-10-CM | POA: Diagnosis not present

## 2015-04-11 DIAGNOSIS — F329 Major depressive disorder, single episode, unspecified: Secondary | ICD-10-CM | POA: Diagnosis not present

## 2015-04-11 DIAGNOSIS — C3401 Malignant neoplasm of right main bronchus: Secondary | ICD-10-CM | POA: Diagnosis not present

## 2015-04-12 ENCOUNTER — Ambulatory Visit
Admission: RE | Admit: 2015-04-12 | Discharge: 2015-04-12 | Disposition: A | Discharge status: Home / Self Care | Payer: Medicare Other | Source: Ambulatory Visit | Attending: Radiation Oncology | Admitting: Radiation Oncology

## 2015-04-12 DIAGNOSIS — F419 Anxiety disorder, unspecified: Secondary | ICD-10-CM | POA: Diagnosis not present

## 2015-04-12 DIAGNOSIS — C3401 Malignant neoplasm of right main bronchus: Secondary | ICD-10-CM | POA: Diagnosis not present

## 2015-04-12 DIAGNOSIS — F17210 Nicotine dependence, cigarettes, uncomplicated: Secondary | ICD-10-CM | POA: Diagnosis not present

## 2015-04-12 DIAGNOSIS — J449 Chronic obstructive pulmonary disease, unspecified: Secondary | ICD-10-CM | POA: Diagnosis not present

## 2015-04-12 DIAGNOSIS — Z7982 Long term (current) use of aspirin: Secondary | ICD-10-CM | POA: Diagnosis not present

## 2015-04-12 DIAGNOSIS — Z510 Encounter for antineoplastic radiation therapy: Secondary | ICD-10-CM | POA: Diagnosis not present

## 2015-04-12 DIAGNOSIS — K219 Gastro-esophageal reflux disease without esophagitis: Secondary | ICD-10-CM | POA: Diagnosis not present

## 2015-04-12 DIAGNOSIS — M8580 Other specified disorders of bone density and structure, unspecified site: Secondary | ICD-10-CM | POA: Diagnosis not present

## 2015-04-12 DIAGNOSIS — J45909 Unspecified asthma, uncomplicated: Secondary | ICD-10-CM | POA: Diagnosis not present

## 2015-04-12 DIAGNOSIS — F329 Major depressive disorder, single episode, unspecified: Secondary | ICD-10-CM | POA: Diagnosis not present

## 2015-04-12 DIAGNOSIS — Z79899 Other long term (current) drug therapy: Secondary | ICD-10-CM | POA: Diagnosis not present

## 2015-04-12 DIAGNOSIS — C3431 Malignant neoplasm of lower lobe, right bronchus or lung: Secondary | ICD-10-CM | POA: Diagnosis not present

## 2015-04-13 ENCOUNTER — Ambulatory Visit
Admission: RE | Admit: 2015-04-13 | Discharge: 2015-04-13 | Disposition: A | Discharge status: Home / Self Care | Payer: Medicare Other | Source: Ambulatory Visit | Attending: Radiation Oncology | Admitting: Radiation Oncology

## 2015-04-13 DIAGNOSIS — C3401 Malignant neoplasm of right main bronchus: Secondary | ICD-10-CM | POA: Diagnosis not present

## 2015-04-13 DIAGNOSIS — F419 Anxiety disorder, unspecified: Secondary | ICD-10-CM | POA: Diagnosis not present

## 2015-04-13 DIAGNOSIS — C3431 Malignant neoplasm of lower lobe, right bronchus or lung: Secondary | ICD-10-CM | POA: Diagnosis not present

## 2015-04-13 DIAGNOSIS — Z510 Encounter for antineoplastic radiation therapy: Secondary | ICD-10-CM | POA: Diagnosis not present

## 2015-04-13 DIAGNOSIS — Z79899 Other long term (current) drug therapy: Secondary | ICD-10-CM | POA: Diagnosis not present

## 2015-04-13 DIAGNOSIS — J449 Chronic obstructive pulmonary disease, unspecified: Secondary | ICD-10-CM | POA: Diagnosis not present

## 2015-04-13 DIAGNOSIS — M8580 Other specified disorders of bone density and structure, unspecified site: Secondary | ICD-10-CM | POA: Diagnosis not present

## 2015-04-13 DIAGNOSIS — J45909 Unspecified asthma, uncomplicated: Secondary | ICD-10-CM | POA: Diagnosis not present

## 2015-04-13 DIAGNOSIS — K219 Gastro-esophageal reflux disease without esophagitis: Secondary | ICD-10-CM | POA: Diagnosis not present

## 2015-04-13 DIAGNOSIS — Z7982 Long term (current) use of aspirin: Secondary | ICD-10-CM | POA: Diagnosis not present

## 2015-04-13 DIAGNOSIS — F17210 Nicotine dependence, cigarettes, uncomplicated: Secondary | ICD-10-CM | POA: Diagnosis not present

## 2015-04-13 DIAGNOSIS — F329 Major depressive disorder, single episode, unspecified: Secondary | ICD-10-CM | POA: Diagnosis not present

## 2015-04-14 ENCOUNTER — Ambulatory Visit
Admission: RE | Admit: 2015-04-14 | Discharge: 2015-04-14 | Disposition: A | Discharge status: Home / Self Care | Payer: Medicare Other | Source: Ambulatory Visit | Attending: Radiation Oncology | Admitting: Radiation Oncology

## 2015-04-14 DIAGNOSIS — J45909 Unspecified asthma, uncomplicated: Secondary | ICD-10-CM | POA: Diagnosis not present

## 2015-04-14 DIAGNOSIS — C3401 Malignant neoplasm of right main bronchus: Secondary | ICD-10-CM | POA: Diagnosis not present

## 2015-04-14 DIAGNOSIS — Z7982 Long term (current) use of aspirin: Secondary | ICD-10-CM | POA: Diagnosis not present

## 2015-04-14 DIAGNOSIS — F329 Major depressive disorder, single episode, unspecified: Secondary | ICD-10-CM | POA: Diagnosis not present

## 2015-04-14 DIAGNOSIS — Z510 Encounter for antineoplastic radiation therapy: Secondary | ICD-10-CM | POA: Diagnosis not present

## 2015-04-14 DIAGNOSIS — M8580 Other specified disorders of bone density and structure, unspecified site: Secondary | ICD-10-CM | POA: Diagnosis not present

## 2015-04-14 DIAGNOSIS — F419 Anxiety disorder, unspecified: Secondary | ICD-10-CM | POA: Diagnosis not present

## 2015-04-14 DIAGNOSIS — J449 Chronic obstructive pulmonary disease, unspecified: Secondary | ICD-10-CM | POA: Diagnosis not present

## 2015-04-14 DIAGNOSIS — Z79899 Other long term (current) drug therapy: Secondary | ICD-10-CM | POA: Diagnosis not present

## 2015-04-14 DIAGNOSIS — C3431 Malignant neoplasm of lower lobe, right bronchus or lung: Secondary | ICD-10-CM | POA: Diagnosis not present

## 2015-04-14 DIAGNOSIS — K219 Gastro-esophageal reflux disease without esophagitis: Secondary | ICD-10-CM | POA: Diagnosis not present

## 2015-04-14 DIAGNOSIS — F17210 Nicotine dependence, cigarettes, uncomplicated: Secondary | ICD-10-CM | POA: Diagnosis not present

## 2015-04-17 ENCOUNTER — Ambulatory Visit
Admission: RE | Admit: 2015-04-17 | Discharge: 2015-04-17 | Disposition: A | Discharge status: Home / Self Care | Payer: Medicare Other | Source: Ambulatory Visit | Attending: Radiation Oncology | Admitting: Radiation Oncology

## 2015-04-17 ENCOUNTER — Inpatient Hospital Stay: Payer: Medicare Other | Attending: Internal Medicine | Admitting: Internal Medicine

## 2015-04-17 VITALS — BP 137/89 | HR 73 | Temp 97.9°F | Ht 71.0 in | Wt 177.5 lb

## 2015-04-17 DIAGNOSIS — K219 Gastro-esophageal reflux disease without esophagitis: Secondary | ICD-10-CM | POA: Diagnosis not present

## 2015-04-17 DIAGNOSIS — F419 Anxiety disorder, unspecified: Secondary | ICD-10-CM | POA: Diagnosis not present

## 2015-04-17 DIAGNOSIS — J449 Chronic obstructive pulmonary disease, unspecified: Secondary | ICD-10-CM | POA: Diagnosis not present

## 2015-04-17 DIAGNOSIS — Z87891 Personal history of nicotine dependence: Secondary | ICD-10-CM | POA: Insufficient documentation

## 2015-04-17 DIAGNOSIS — C3410 Malignant neoplasm of upper lobe, unspecified bronchus or lung: Secondary | ICD-10-CM | POA: Insufficient documentation

## 2015-04-17 DIAGNOSIS — Z510 Encounter for antineoplastic radiation therapy: Secondary | ICD-10-CM | POA: Diagnosis not present

## 2015-04-17 DIAGNOSIS — F418 Other specified anxiety disorders: Secondary | ICD-10-CM | POA: Diagnosis not present

## 2015-04-17 DIAGNOSIS — R042 Hemoptysis: Secondary | ICD-10-CM

## 2015-04-17 DIAGNOSIS — J45909 Unspecified asthma, uncomplicated: Secondary | ICD-10-CM | POA: Diagnosis not present

## 2015-04-17 DIAGNOSIS — C3411 Malignant neoplasm of upper lobe, right bronchus or lung: Secondary | ICD-10-CM | POA: Diagnosis not present

## 2015-04-17 DIAGNOSIS — F329 Major depressive disorder, single episode, unspecified: Secondary | ICD-10-CM | POA: Diagnosis not present

## 2015-04-17 DIAGNOSIS — Z79899 Other long term (current) drug therapy: Secondary | ICD-10-CM | POA: Insufficient documentation

## 2015-04-17 DIAGNOSIS — C3490 Malignant neoplasm of unspecified part of unspecified bronchus or lung: Secondary | ICD-10-CM | POA: Insufficient documentation

## 2015-04-17 DIAGNOSIS — C3401 Malignant neoplasm of right main bronchus: Secondary | ICD-10-CM | POA: Diagnosis not present

## 2015-04-17 DIAGNOSIS — F17210 Nicotine dependence, cigarettes, uncomplicated: Secondary | ICD-10-CM | POA: Diagnosis not present

## 2015-04-17 DIAGNOSIS — G4730 Sleep apnea, unspecified: Secondary | ICD-10-CM | POA: Insufficient documentation

## 2015-04-17 DIAGNOSIS — Z7982 Long term (current) use of aspirin: Secondary | ICD-10-CM | POA: Diagnosis not present

## 2015-04-17 DIAGNOSIS — M8580 Other specified disorders of bone density and structure, unspecified site: Secondary | ICD-10-CM | POA: Diagnosis not present

## 2015-04-17 DIAGNOSIS — C3431 Malignant neoplasm of lower lobe, right bronchus or lung: Secondary | ICD-10-CM | POA: Diagnosis not present

## 2015-04-17 NOTE — Progress Notes (Signed)
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  female long-standing history of smoking COPD with squamous cell lung cancer stage I currently on definitive radiation is here for follow-up. She is finishing her radiation this week.  She denies any significant skin rash or difficulty swallowing or pain with swallowing. Her energy levels are fair. Her appetite is good.  Patient had an episode of blood streaks in his sputum. She has been taken off aspirin-by radiation oncology. This has resolved. Denies any chest pain. Admits to chronic shortness of breath. Otherwise denies any headaches or vision changes or any bone pain.   ROS: A complete 10 point review of system is done which is negative except mentioned above in history of present illness  MEDICAL HISTORY:  Past Medical History  Diagnosis Date  . Anxiety   . Asthma   . SOB (shortness of breath)   . Osteopenia   . Depression   . Panic disorder   . History of peptic ulcer disease   . Personal history of tobacco use, presenting hazards to health 01/16/2015  . Low oxygen saturation     2l hs  . Sleep apnea   . Squamous cell carcinoma of right lung ([place])   . COPD (chronic obstructive pulmonary disease) ([place])   . Barrett's esophagus   . GERD (gastroesophageal reflux disease)     SURGICAL HISTORY: Past Surgical History  Procedure Laterality Date  . Tubal ligation  1970's  . Cholecystectomy  1980's  . Elbow fracture surgery Right 2009  . Tonsillectomy  1979  . Cervical cone biopsy  1990  . Endobronchial ultrasound N/A 02/07/2015    Procedure: ENDOBRONCHIAL ULTRASOUND;   Surgeon: Flora Lipps, MD;  Location: [facility] ORS;  Service: Cardiopulmonary;  Laterality: N/A;  . Upper gi endoscopy    . Colonoscopy  2014    SOCIAL HISTORY: Social History   Social History  . Marital Status: Married    Spouse Name: N/A  . Number of Children: N/A  . Years of Education: N/A   Occupational History  . Not on file.   Social History Main Topics  . Smoking status: Current Every Day Smoker -- 1.00 packs/day for 55 years    Types: Cigarettes  . Smokeless tobacco: Never Used     Comment: used to use E-cig  . Alcohol Use: No  . Drug Use: No  . Sexual Activity: Not on file   Other Topics Concern  . Not on file   Social History Narrative    FAMILY HISTORY: Family History  Problem Relation Age of Onset  . Colon cancer Mother     colon  . Diabetes Mother   . Arthritis Mother   . Glaucoma Mother   . Stomach cancer Father     stomach  . Throat cancer Brother     throat  . Breast cancer Sister     breast  . Diabetes Sister   . Cirrhosis Sister   . Cancer Paternal Aunt   . Parkinson's disease Sister   . Heart attack Sister   . Ovarian cancer Sister     ALLERGIES:  has No Known Allergies.  MEDICATIONS:  Current Outpatient Prescriptions  Medication Sig Dispense Refill  . albuterol (PROVENTIL HFA;VENTOLIN HFA) 108 (90 BASE) MCG/ACT inhaler Inhale 2 puffs into the lungs every 6 (six) hours as needed for shortness of breath (or 30 minutes before exercize).    Jearl Klinefelter ELLIPTA 62.5-25 MCG/INH AEPB INHALE ONE PUFF BY MOUTH ONCE DAILY 60 each 3  . busPIRone (BUSPAR) 30 MG tablet Take 1 tablet by mouth 2 (two) times daily.    Marland Kitchen CALCIUM CARBONATE-VIT D-MIN PO Take 1 tablet by mouth 3 (three) times daily.    . clotrimazole-betamethasone (LOTRISONE) cream LOTRISONE, 1-0.05% (External Cream)  1 (one) Cream Cream Apply twice daily to affected area.  Not for Internal use.  (Correction from prev script said not for external use and should read , not for internal use) for 0  days  Quantity: 45;  Refills: 1   Ordered :22-Apr-2013  Renaldo Fiddler ;  Started 22-Apr-2013 Active    . escitalopram (LEXAPRO) 20 MG tablet Take 1 tablet by mouth daily.    . Omega-3 Fatty Acids (FISH OIL) 1200 MG CAPS Take 1 capsule by mouth daily.    Marland Kitchen omeprazole (PRILOSEC) 20 MG capsule Take 20 mg by mouth daily.    . polyethylene glycol powder (GLYCOLAX/MIRALAX) powder Take 17 g by mouth daily.    . pravastatin (PRAVACHOL) 20 MG tablet Take 1 tablet (20 mg total) by mouth at bedtime. 90 tablet 3   No current facility-administered medications for this visit.      Marland Kitchen  PHYSICAL EXAMINATION: ECOG PERFORMANCE STATUS: 1 - Symptomatic but completely ambulatory  Filed Vitals:   04/17/15 1017  BP: 137/89  Pulse: 73  Temp: 97.9 F (36.6 C)   Filed Weights   04/17/15 1017  Weight: 177 lb 7.5 oz (80.5 kg)    GENERAL: Well-nourished well-developed; Alert, no distress and comfortable.   She is accompanied by family.  EYES: no pallor or icterus OROPHARYNX: no thrush or ulceration; good dentition  NECK: supple, no masses felt LYMPH:  no palpable lymphadenopathy in the cervical, axillary or inguinal regions LUNGS: clear to auscultation and  No wheeze or crackles HEART/CVS: regular rate & rhythm and no murmurs; No lower extremity edema ABDOMEN: abdomen soft, non-tender and normal bowel sounds Musculoskeletal:no cyanosis of digits and no clubbing  PSYCH: alert & oriented x 3 with fluent speech NEURO: no focal motor/sensory deficits SKIN:  no rashes or significant lesions  LABORATORY DATA:  I have reviewed the data as listed Lab Results  Component Value Date   WBC 8.4 02/06/2015   HGB 14.8 02/06/2015   HCT 44.3 02/06/2015   MCV 92.3 02/06/2015   PLT 219 02/06/2015    Recent Labs  02/06/15 1053  NA 137  K 4.4  CL 103  CO2 27  GLUCOSE 92  BUN 13  CREATININE 0.63  CALCIUM 9.2  GFRNONAA >60  GFRAA >60    RADIOGRAPHIC STUDIES: I have personally reviewed the  radiological images as listed and agreed with the findings in the report. No results found.  ASSESSMENT & PLAN:   # Squamous cell carcinoma of the lung stage I [ cT1aN0]; hilar lymph node eqivocal on PET scan. On definitive radiation therapy-tolerating it very well.  # I would recommend a CT scan in approximately 6-7 weeks; follow up with me in 8 weeks. The decision for chemotherapy would be based on the results of the CT scan. In general I would lean away from chemotherapy. There  is no worsening hilar adenopathy/or any new lesions.  CT scan has been ordered.  # Mild hemoptysis- improved; off ASPIRIN.  The patient follow-up with me in approximately 2 months/ Labs/scan prior to visit.  # 25 minutes face-to-face with the patient discussing the above plan of care; more than 50% of time spent on prognosis/ natural history; counseling and coordination.    Cammie Sickle, MD 04/17/2015 10:35 AM

## 2015-04-18 ENCOUNTER — Ambulatory Visit
Admission: RE | Admit: 2015-04-18 | Discharge: 2015-04-18 | Disposition: A | Discharge status: Home / Self Care | Payer: Medicare Other | Source: Ambulatory Visit | Attending: Radiation Oncology | Admitting: Radiation Oncology

## 2015-04-18 DIAGNOSIS — F17210 Nicotine dependence, cigarettes, uncomplicated: Secondary | ICD-10-CM | POA: Diagnosis not present

## 2015-04-18 DIAGNOSIS — K219 Gastro-esophageal reflux disease without esophagitis: Secondary | ICD-10-CM | POA: Diagnosis not present

## 2015-04-18 DIAGNOSIS — C3401 Malignant neoplasm of right main bronchus: Secondary | ICD-10-CM | POA: Diagnosis not present

## 2015-04-18 DIAGNOSIS — J45909 Unspecified asthma, uncomplicated: Secondary | ICD-10-CM | POA: Diagnosis not present

## 2015-04-18 DIAGNOSIS — Z510 Encounter for antineoplastic radiation therapy: Secondary | ICD-10-CM | POA: Diagnosis not present

## 2015-04-18 DIAGNOSIS — F329 Major depressive disorder, single episode, unspecified: Secondary | ICD-10-CM | POA: Diagnosis not present

## 2015-04-18 DIAGNOSIS — F419 Anxiety disorder, unspecified: Secondary | ICD-10-CM | POA: Diagnosis not present

## 2015-04-18 DIAGNOSIS — Z7982 Long term (current) use of aspirin: Secondary | ICD-10-CM | POA: Diagnosis not present

## 2015-04-18 DIAGNOSIS — C3431 Malignant neoplasm of lower lobe, right bronchus or lung: Secondary | ICD-10-CM | POA: Diagnosis not present

## 2015-04-18 DIAGNOSIS — M8580 Other specified disorders of bone density and structure, unspecified site: Secondary | ICD-10-CM | POA: Diagnosis not present

## 2015-04-18 DIAGNOSIS — Z79899 Other long term (current) drug therapy: Secondary | ICD-10-CM | POA: Diagnosis not present

## 2015-04-18 DIAGNOSIS — J449 Chronic obstructive pulmonary disease, unspecified: Secondary | ICD-10-CM | POA: Diagnosis not present

## 2015-04-19 ENCOUNTER — Ambulatory Visit
Admission: RE | Admit: 2015-04-19 | Discharge: 2015-04-19 | Disposition: A | Discharge status: Home / Self Care | Payer: Medicare Other | Source: Ambulatory Visit | Attending: Radiation Oncology | Admitting: Radiation Oncology

## 2015-04-20 ENCOUNTER — Ambulatory Visit
Admission: RE | Admit: 2015-04-20 | Discharge: 2015-04-20 | Disposition: A | Discharge status: Home / Self Care | Payer: Medicare Other | Source: Ambulatory Visit | Attending: Radiation Oncology | Admitting: Radiation Oncology

## 2015-04-20 DIAGNOSIS — Z79899 Other long term (current) drug therapy: Secondary | ICD-10-CM | POA: Diagnosis not present

## 2015-04-20 DIAGNOSIS — K219 Gastro-esophageal reflux disease without esophagitis: Secondary | ICD-10-CM | POA: Diagnosis not present

## 2015-04-20 DIAGNOSIS — J449 Chronic obstructive pulmonary disease, unspecified: Secondary | ICD-10-CM | POA: Diagnosis not present

## 2015-04-20 DIAGNOSIS — F419 Anxiety disorder, unspecified: Secondary | ICD-10-CM | POA: Diagnosis not present

## 2015-04-20 DIAGNOSIS — J45909 Unspecified asthma, uncomplicated: Secondary | ICD-10-CM | POA: Diagnosis not present

## 2015-04-20 DIAGNOSIS — F329 Major depressive disorder, single episode, unspecified: Secondary | ICD-10-CM | POA: Diagnosis not present

## 2015-04-20 DIAGNOSIS — F17210 Nicotine dependence, cigarettes, uncomplicated: Secondary | ICD-10-CM | POA: Diagnosis not present

## 2015-04-20 DIAGNOSIS — Z510 Encounter for antineoplastic radiation therapy: Secondary | ICD-10-CM | POA: Diagnosis not present

## 2015-04-20 DIAGNOSIS — M8580 Other specified disorders of bone density and structure, unspecified site: Secondary | ICD-10-CM | POA: Diagnosis not present

## 2015-04-20 DIAGNOSIS — C3401 Malignant neoplasm of right main bronchus: Secondary | ICD-10-CM | POA: Diagnosis not present

## 2015-04-20 DIAGNOSIS — C3431 Malignant neoplasm of lower lobe, right bronchus or lung: Secondary | ICD-10-CM | POA: Diagnosis not present

## 2015-04-20 DIAGNOSIS — Z7982 Long term (current) use of aspirin: Secondary | ICD-10-CM | POA: Diagnosis not present

## 2015-04-21 ENCOUNTER — Ambulatory Visit
Admission: RE | Admit: 2015-04-21 | Discharge: 2015-04-21 | Disposition: A | Discharge status: Home / Self Care | Payer: Medicare Other | Source: Ambulatory Visit | Attending: Radiation Oncology | Admitting: Radiation Oncology

## 2015-04-21 DIAGNOSIS — M8580 Other specified disorders of bone density and structure, unspecified site: Secondary | ICD-10-CM | POA: Diagnosis not present

## 2015-04-21 DIAGNOSIS — Z510 Encounter for antineoplastic radiation therapy: Secondary | ICD-10-CM | POA: Diagnosis not present

## 2015-04-21 DIAGNOSIS — Z7982 Long term (current) use of aspirin: Secondary | ICD-10-CM | POA: Diagnosis not present

## 2015-04-21 DIAGNOSIS — J449 Chronic obstructive pulmonary disease, unspecified: Secondary | ICD-10-CM | POA: Diagnosis not present

## 2015-04-21 DIAGNOSIS — Z79899 Other long term (current) drug therapy: Secondary | ICD-10-CM | POA: Diagnosis not present

## 2015-04-21 DIAGNOSIS — F419 Anxiety disorder, unspecified: Secondary | ICD-10-CM | POA: Diagnosis not present

## 2015-04-21 DIAGNOSIS — K219 Gastro-esophageal reflux disease without esophagitis: Secondary | ICD-10-CM | POA: Diagnosis not present

## 2015-04-21 DIAGNOSIS — C3431 Malignant neoplasm of lower lobe, right bronchus or lung: Secondary | ICD-10-CM | POA: Diagnosis not present

## 2015-04-21 DIAGNOSIS — F17210 Nicotine dependence, cigarettes, uncomplicated: Secondary | ICD-10-CM | POA: Diagnosis not present

## 2015-04-21 DIAGNOSIS — C3401 Malignant neoplasm of right main bronchus: Secondary | ICD-10-CM | POA: Diagnosis not present

## 2015-04-21 DIAGNOSIS — F329 Major depressive disorder, single episode, unspecified: Secondary | ICD-10-CM | POA: Diagnosis not present

## 2015-04-21 DIAGNOSIS — J45909 Unspecified asthma, uncomplicated: Secondary | ICD-10-CM | POA: Diagnosis not present

## 2015-04-25 ENCOUNTER — Ambulatory Visit (INDEPENDENT_AMBULATORY_CARE_PROVIDER_SITE_OTHER): Payer: Medicare Other | Admitting: Physician Assistant

## 2015-04-25 ENCOUNTER — Encounter: Payer: Self-pay | Admitting: Physician Assistant

## 2015-04-25 VITALS — BP 90/60 | HR 75 | Temp 98.3°F | Resp 16 | Ht 71.0 in | Wt 175.0 lb

## 2015-04-25 DIAGNOSIS — Z23 Encounter for immunization: Secondary | ICD-10-CM

## 2015-04-25 DIAGNOSIS — C3411 Malignant neoplasm of upper lobe, right bronchus or lung: Secondary | ICD-10-CM

## 2015-04-25 DIAGNOSIS — Z0000 Encounter for general adult medical examination without abnormal findings: Secondary | ICD-10-CM | POA: Diagnosis not present

## 2015-04-25 DIAGNOSIS — J441 Chronic obstructive pulmonary disease with (acute) exacerbation: Secondary | ICD-10-CM

## 2015-04-25 MED ORDER — AZITHROMYCIN 250 MG PO TABS
ORAL_TABLET | ORAL | Status: DC
Start: 2015-04-25 — End: 2015-05-04

## 2015-04-25 MED ORDER — INFLUENZA VAC SPLIT HIGH-DOSE 0.5 ML IM SUSY: 0.5000 mL | PREFILLED_SYRINGE | Freq: Once | INTRAMUSCULAR | Status: DC

## 2015-04-25 NOTE — Progress Notes (Signed)
Patient: Cathy Watts, Female    DOB: 1945-11-23, 69 y.o.   MRN: [id number] Visit Date: 04/25/2015  Today's Provider: Mar Daring, PA-C   Chief Complaint  Patient presents with  . Medicare Wellness   Subjective:    Annual wellness visit Cathy Watts is a 68 y.o. female. She feels well. She reports was exercising until she started her radiation for the lung cancer and patient report finished radiation on 12/16. She states that she went to a including that was done at the cancer center if you are high risk for lung cancer in October and a grade 1 cancer was found in her right lower lung lobe. She states that her lung function was not strong enough for her to undergo resection of the mass thus they decided to proceed with radiation. She does go back in 4 weeks for her repeat chest CT and then will see the physician 2 weeks following the chest CT for the results. At that time it will be decided if she has to undergo more radiation or chemotherapy and radiation versus no further treatment. She reports she is sleeping well.  Last PCP: 04/22/14 Mammogram: 06/02/14 BI-RADS Category 1: Negative Bone Density: 05/07/13 Total Femoral density is normal. The femoral Neck and spine densities indicates Osteopenia. Colonoscopy: 07/21/12 Diverticulosis. Repeat in 5 years.  Review of Systems  Constitutional: Negative.   HENT: Positive for congestion, rhinorrhea and sinus pressure. Negative for ear pain, postnasal drip, sneezing, sore throat, tinnitus, trouble swallowing and voice change.   Eyes: Negative.   Respiratory: Positive for cough, shortness of breath and wheezing. Negative for chest tightness.   Cardiovascular: Negative.   Gastrointestinal: Negative.   Endocrine: Positive for polyuria.  Genitourinary: Positive for enuresis. Negative for dysuria, frequency and hematuria.  Musculoskeletal: Negative.   Skin: Negative.   Allergic/Immunologic: Negative.   Neurological:  Negative.   Hematological: Negative.   Psychiatric/Behavioral: Negative.   All other systems reviewed and are negative.   Social History   Social History  . Marital Status: Married    Spouse Name: N/A  . Number of Children: N/A  . Years of Education: N/A   Occupational History  . Not on file.   Social History Main Topics  . Smoking status: Current Every Day Smoker -- 1.00 packs/day for 55 years    Types: Cigarettes  . Smokeless tobacco: Never Used     Comment: used to use E-cig  . Alcohol Use: No  . Drug Use: No  . Sexual Activity: Not on file   Other Topics Concern  . Not on file   Social History Narrative    Patient Active Problem List   Diagnosis Date Noted  . Lung cancer, upper lobe ([place]) 04/17/2015  . Lung mass   . Nonspecific abnormal findings on radiological and examination of lung field 01/17/2015  . Personal history of tobacco use, presenting hazards to health 01/16/2015  . Anxiety 11/15/2014  . Airway hyperreactivity 11/15/2014  . CN (constipation) 11/15/2014  . Acute exacerbation of chronic obstructive airways disease ([place]) 11/15/2014  . Daytime somnolence 11/15/2014  . Clinical depression 11/15/2014  . DD (diverticular disease) 11/15/2014  . Accumulation of fluid in tissues 11/15/2014  . H/O peptic ulcer 11/15/2014  . Osteopenia 11/15/2014  . Awareness of heartbeats 11/15/2014  . Episodic paroxysmal anxiety disorder 11/15/2014  . Apnea, sleep 11/15/2014  . Snores 11/15/2014  . Breath shortness 11/15/2014  . Compulsive tobacco user syndrome 11/15/2014  .  Avitaminosis D 11/15/2014  . Barrett esophagus 10/19/2014  . Acid reflux 10/19/2014  . Hypercholesterolemia 10/19/2014  . COPD, moderate ([place]) 08/31/2013  . Dyspnea 08/31/2013  . Tobacco use disorder 08/31/2013  . Otitis, externa, infective 08/31/2013  . Nocturnal hypoxemia 08/31/2013  . CAFL (chronic airflow limitation) ([place]) 09/17/2011  . Hypercholesterolemia without hypertriglyceridemia  11/05/2005    Past Surgical History  Procedure Laterality Date  . Tubal ligation  1970's  . Cholecystectomy  1980's  . Elbow fracture surgery Right 2009  . Tonsillectomy  1979  . Cervical cone biopsy  1990  . Endobronchial ultrasound N/A 02/07/2015    Procedure: ENDOBRONCHIAL ULTRASOUND;  Surgeon: Flora Lipps, MD;  Location: [facility] ORS;  Service: Cardiopulmonary;  Laterality: N/A;  . Upper gi endoscopy    . Colonoscopy  2014    Her family history includes Arthritis in her mother; Breast cancer in her sister; Cancer in her paternal aunt; Cirrhosis in her sister; Colon cancer in her mother; Diabetes in her mother and sister; Glaucoma in her mother; Heart attack in her sister; Ovarian cancer in her sister; Parkinson's disease in her sister; Stomach cancer in her father; Throat cancer in her brother.    Previous Medications   ALBUTEROL (PROVENTIL HFA;VENTOLIN HFA) 108 (90 BASE) MCG/ACT INHALER    Inhale 2 puffs into the lungs every 6 (six) hours as needed for shortness of breath (or 30 minutes before exercize).   ANORO ELLIPTA 62.5-25 MCG/INH AEPB    INHALE ONE PUFF BY MOUTH ONCE DAILY   BUSPIRONE (BUSPAR) 30 MG TABLET    Take 1 tablet by mouth 2 (two) times daily.   CALCIUM CARBONATE-VIT D-MIN PO    Take 1 tablet by mouth 3 (three) times daily.   CLOTRIMAZOLE-BETAMETHASONE (LOTRISONE) CREAM    LOTRISONE, 1-0.05% (External Cream)  1 (one) Cream Cream Apply twice daily to affected area.  Not for Internal use.  (Correction from prev script said not for external use and should read , not for internal use) for 0 days  Quantity: 45;  Refills: 1   Ordered :22-Apr-2013  Renaldo Fiddler ;  Started 22-Apr-2013 Active   ESCITALOPRAM (LEXAPRO) 20 MG TABLET    Take 1 tablet by mouth daily.   OMEGA-3 FATTY ACIDS (FISH OIL) 1200 MG CAPS    Take 1 capsule by mouth daily.   OMEPRAZOLE (PRILOSEC) 20 MG CAPSULE    Take 20 mg by mouth daily.   POLYETHYLENE GLYCOL POWDER (GLYCOLAX/MIRALAX) POWDER    Take 17  g by mouth daily.   PRAVASTATIN (PRAVACHOL) 20 MG TABLET    Take 1 tablet (20 mg total) by mouth at bedtime.    Patient Care Team: Margarita Rana, MD as PCP - General (Family Medicine)     Objective:   Vitals: BP 90/60 mmHg  Pulse 75  Temp(Src) 98.3 F (36.8 C) (Oral)  Resp 16  Ht '5\' 11"'$  (1.803 m)  Wt 175 lb (79.379 kg)  BMI 24.42 kg/m2  SpO2 93%  Physical Exam  Constitutional: She is oriented to person, place, and time. She appears well-developed and well-nourished. No distress.  HENT:  Head: Normocephalic and atraumatic.  Right Ear: External ear normal.  Left Ear: External ear normal.  Nose: Nose normal.  Mouth/Throat: Oropharynx is clear and moist. No oropharyngeal exudate.  Eyes: Conjunctivae and EOM are normal. Pupils are equal, round, and reactive to light. Right eye exhibits no discharge. Left eye exhibits no discharge. No scleral icterus.  Neck: Normal range of motion. Neck supple. No  JVD present. No tracheal deviation present. No thyromegaly present.  Cardiovascular: Normal rate, regular rhythm, normal heart sounds and intact distal pulses.  Exam reveals no gallop and no friction rub.   No murmur heard. Pulmonary/Chest: Effort normal. No respiratory distress. She has decreased breath sounds. She has no wheezes. She has rhonchi (deep, prolonged expiratory rhonchi). She has no rales. She exhibits no tenderness.  Abdominal: Soft. Bowel sounds are normal. She exhibits no distension and no mass. There is no tenderness. There is no rebound and no guarding.  Genitourinary:  Patient deferred  Musculoskeletal: Normal range of motion. She exhibits no edema or tenderness.  Lymphadenopathy:    She has no cervical adenopathy.  Neurological: She is alert and oriented to person, place, and time.  Skin: Skin is warm and dry. No rash noted. She is not diaphoretic.  Psychiatric: She has a normal mood and affect. Her behavior is normal. Judgment and thought content normal.  Vitals  reviewed.   Activities of Daily Living In your present state of health, do you have any difficulty performing the following activities: 04/25/2015 02/06/2015  Hearing? N N  Vision? N N  Difficulty concentrating or making decisions? N N  Walking or climbing stairs? N N  Dressing or bathing? N N  Doing errands, shopping? N -    Fall Risk Assessment Fall Risk  04/25/2015 02/15/2015  Falls in the past year? No No     Depression Screen PHQ 2/9 Scores 04/25/2015 02/15/2015  PHQ - 2 Score 0 0    Cognitive Testing - 6-CIT  Correct? Score   What year is it? yes 0 0 or 4  What month is it? yes 0 0 or 3  Memorize:    Pia Mau,  42,  [address],  [place],      What time is it? (within 1 hour) yes 0 0 or 3  Count backwards from 20 yes 0 0, 2, or 4  Name the months of the year yes 0 0, 2, or 4  Repeat name & address above yes 0 0, 2, 4, 6, 8, or 10       TOTAL SCORE  0/28   Interpretation:  Normal  Normal (0-7) Abnormal (8-28)   Audit-C Alcohol Use Screening  Question Answer Points  How often do you have alcoholic drink? never 0  On days you do drink alcohol, how many drinks do you typically consume? 0 0  How oftey will you drink 6 or more in a total? never 0  Total Score:  0   A score of 3 or more in women, and 4 or more in men indicates increased risk for alcohol abuse, EXCEPT if all of the points are from question 1.     Assessment & Plan:     Annual Wellness Visit  Reviewed patient's Family Medical History Reviewed and updated list of patient's medical providers Assessment of cognitive impairment was done Assessed patient's functional ability Established a written schedule for health screening [place] Completed and Reviewed  1. Medicare annual wellness visit, subsequent  2. Need for influenza vaccination  being that we did not have the high-dose influenza vaccine available I did prescribed this medication and sent to [facility] for  her So that she may have this done there. I did advise her that if they do not have the high-dose for her to return and get the regular flu vaccine. She voiced understanding. - Influenza Vac Split High-Dose 0.5 ML  SUSY; Inject 0.5 mLs into the muscle once.  Dispense: 0.5 mL; Refill: 0  3. Chronic obstructive pulmonary disease with acute exacerbation (HCC)  worsening congestion and rhonchi heard throughout on lung exam today. She does have inhalers already that she is using and I did advise her that she may use her albuterol inhaler every 6 hours as needed for shortness of breath at this time. I will also prescribe her a Z-Pak as below being that she is very high risk to have bad secondary outcomes if she were to get bronchitis or pneumonia. She is to call the office if she does not improve in the next 6-10 days. - azithromycin (ZITHROMAX) 250 MG tablet; [place] 2 tabs PO on day 1, then 1 tab PO daily thereafter  Dispense: 6 tablet; Refill: 0  4. Malignant neoplasm of upper lobe of right lung [facility]) Currently followed by [facility]. She follows up in 4 weeks for her repeat chest CT and then will see the physician in 2 weeks. At that time it will be determined what her continuation of treatment will be.  Exercise Activities and Dietary recommendations Goals    None      Immunization History  Administered Date(s) Administered  . Influenza Split 04/02/2013  . Influenza-Unspecified 02/08/2014  . Pneumococcal Conjugate-13 04/22/2014  . Pneumococcal Polysaccharide-23 04/20/2013  . Pneumococcal-Unspecified 04/02/2013, 04/10/2014  . Tdap 11/24/2009  . Zoster 07/19/2013    Health Maintenance  Topic Date Due  . Hepatitis C Screening  [birth year]  . MAMMOGRAM  03/21/1996  . COLONOSCOPY  03/21/1996  . DEXA SCAN  03/22/2011  . INFLUENZA VACCINE  02/15/2016 (Originally 12/05/2014)  . TETANUS/TDAP  11/25/2019  . ZOSTAVAX  Completed  . PNA vac Low Risk Adult   Completed      Discussed health benefits of physical activity, and encouraged her to engage in regular exercise appropriate for her age and condition.    ------------------------------------------------------------------------------------------------------------

## 2015-04-25 NOTE — Patient Instructions (Signed)

## 2015-05-04 ENCOUNTER — Telehealth: Payer: Self-pay | Admitting: Family Medicine

## 2015-05-04 DIAGNOSIS — J069 Acute upper respiratory infection, unspecified: Secondary | ICD-10-CM

## 2015-05-04 MED ORDER — AMOXICILLIN-POT CLAVULANATE 875-125 MG PO TABS: 1.0000 | ORAL_TABLET | Freq: Two times a day (BID) | ORAL | Status: DC

## 2015-05-04 NOTE — Telephone Encounter (Signed)
Pt advised-aa 

## 2015-05-04 NOTE — Telephone Encounter (Signed)
Please notify patient new prescription has been sent to [facility].

## 2015-05-04 NOTE — Telephone Encounter (Signed)
Please review-aa 

## 2015-05-04 NOTE — Telephone Encounter (Signed)
Pt called back saying she is still coughing and having a lot of mucus.  She has finished the zpak about a week ago.    Please advise  [phone]  Thanks Con Memos

## 2015-05-10 ENCOUNTER — Ambulatory Visit (INDEPENDENT_AMBULATORY_CARE_PROVIDER_SITE_OTHER): Payer: Medicare Other | Admitting: Internal Medicine

## 2015-05-10 ENCOUNTER — Encounter: Payer: Self-pay | Admitting: Internal Medicine

## 2015-05-10 VITALS — BP 134/70 | HR 90 | Ht 71.0 in | Wt 172.2 lb

## 2015-05-10 DIAGNOSIS — J441 Chronic obstructive pulmonary disease with (acute) exacerbation: Secondary | ICD-10-CM | POA: Diagnosis not present

## 2015-05-10 DIAGNOSIS — J449 Chronic obstructive pulmonary disease, unspecified: Secondary | ICD-10-CM | POA: Diagnosis not present

## 2015-05-10 MED ORDER — PREDNISONE 20 MG PO TABS: ORAL_TABLET | ORAL | Status: DC

## 2015-05-10 MED ORDER — UMECLIDINIUM BROMIDE 62.5 MCG/INH IN AEPB
1.0000 | INHALATION_SPRAY | Freq: Every day | RESPIRATORY_TRACT | Status: AC
Start: 2015-05-10 — End: 2015-05-11

## 2015-05-10 MED ORDER — FLUTICASONE FUROATE-VILANTEROL 100-25 MCG/INH IN AEPB: 1.0000 | INHALATION_SPRAY | Freq: Every day | RESPIRATORY_TRACT | Status: AC

## 2015-05-10 MED ORDER — ALBUTEROL SULFATE HFA 108 (90 BASE) MCG/ACT IN AERS: 2.0000 | INHALATION_SPRAY | RESPIRATORY_TRACT | Status: DC | PRN

## 2015-05-10 NOTE — Progress Notes (Signed)
   Subjective:    Patient ID: Cathy Watts, female    DOB: 1945-11-09, 70 y.o.   MRN: [id number]  Synopsis: Gold Grade B with moderate airflow obstruction 2015, still smoking as of 05/2015 Recent DX of SQ cell Lung cancer  CC: cough, flu like symtpoms, wheezing HPI  Patient with acute SOB wheezing, cough and chest congestion Still smokes cigarettes Has fevers, chills she wears oxygen at night 2 L [place] Has been on oral abx for last 2 weeks without any improvement  Review of Systems  Constitutional: Positive for fever, chills and fatigue.  HENT: Positive for congestion.   Respiratory: Positive for cough, shortness of breath and wheezing.   Cardiovascular: Negative for chest pain and leg swelling.  Gastrointestinal: Negative.        Objective:   Physical Exam  Constitutional: She is oriented to person, place, and time. She appears well-developed and well-nourished. No distress.  HENT:  Head: Normocephalic and atraumatic.  Eyes: EOM are normal. Pupils are equal, round, and reactive to light.  Neck: Normal range of motion.  Cardiovascular: Normal rate, regular rhythm and normal heart sounds.   Pulmonary/Chest: Effort normal. No stridor. She has wheezes. She has rales.  Abdominal: Soft.  Neurological: She is alert and oriented to person, place, and time.  Skin: She is not diaphoretic.   Filed Vitals:   05/10/15 1125  BP: 134/70  Pulse: 90  Height: '5\' 11"'$  (1.803 m)  Weight: 172 lb 3.2 oz (78.109 kg)  SpO2: 95%  RA       Assessment & Plan:    70 yo white female with recent dx of [place] cell lung cancer with acute COPD exacerbation from acute bronchitis  1.start prednisone therapy for 10 days 2.change to [facility] for long standing COPD and -incruse for acute bronchospasms 3.continue oxygen therapy at night with 2 L [place] 4.smoking cessation strongly advised  The Patient requires high complexity decision making for assessment and support, frequent evaluation and titration of  therapies. Patient/Family are satisfied with Plan of action and management. All questions answered  Corrin Parker, M.D.  [facility]  Medical Director [place] Director [facility] Cardio-Pulmonary Department

## 2015-05-10 NOTE — Patient Instructions (Signed)
Chronic Obstructive Pulmonary Disease Chronic obstructive pulmonary disease (COPD) is a common lung condition in which airflow from the lungs is limited. COPD is a general term that can be used to describe many different lung problems that limit airflow, including both chronic bronchitis and emphysema. If you have COPD, your lung function will probably never return to normal, but there are measures you can take to improve lung function and make yourself feel better. CAUSES   Smoking (common).  Exposure to secondhand smoke.  Genetic problems.  Chronic inflammatory lung diseases or recurrent infections. SYMPTOMS  Shortness of breath, especially with physical activity.  Deep, persistent (chronic) cough with a large amount of thick mucus.  Wheezing.  Rapid breaths (tachypnea).  Gray or bluish discoloration (cyanosis) of the skin, especially in your fingers, toes, or lips.  Fatigue.  Weight loss.  Frequent infections or episodes when breathing symptoms become much worse (exacerbations).  Chest tightness. DIAGNOSIS Your health care provider will take a medical history and perform a physical examination to diagnose COPD. Additional tests for COPD may include:  Lung (pulmonary) function tests.  Chest X-ray.  CT scan.  Blood tests. TREATMENT  Treatment for COPD may include:  Inhaler and nebulizer medicines. These help manage the symptoms of COPD and make your breathing more comfortable.  Supplemental oxygen. Supplemental oxygen is only helpful if you have a low oxygen level in your blood.  Exercise and physical activity. These are beneficial for nearly all people with COPD.  Lung surgery or transplant.  Nutrition therapy to gain weight, if you are underweight.  Pulmonary rehabilitation. This may involve working with a team of health care providers and specialists, such as respiratory, occupational, and physical therapists. HOME CARE INSTRUCTIONS  Take all medicines  (inhaled or pills) as directed by your health care provider.  Avoid over-the-counter medicines or cough syrups that dry up your airway (such as antihistamines) and slow down the elimination of secretions unless instructed otherwise by your health care provider.  If you are a smoker, the most important thing that you can do is stop smoking. Continuing to smoke will cause further lung damage and breathing trouble. Ask your health care provider for help with quitting smoking. He or she can direct you to community resources or hospitals that provide support.  Avoid exposure to irritants such as smoke, chemicals, and fumes that aggravate your breathing.  Use oxygen therapy and pulmonary rehabilitation if directed by your health care provider. If you require home oxygen therapy, ask your health care provider whether you should purchase a pulse oximeter to measure your oxygen level at home.  Avoid contact with individuals who have a contagious illness.  Avoid extreme temperature and humidity changes.  Eat healthy foods. Eating smaller, more frequent meals and resting before meals may help you maintain your strength.  Stay active, but balance activity with periods of rest. Exercise and physical activity will help you maintain your ability to do things you want to do.  Preventing infection and hospitalization is very important when you have COPD. Make sure to receive all the vaccines your health care provider recommends, especially the pneumococcal and influenza vaccines. Ask your health care provider whether you need a pneumonia vaccine.  Learn and use relaxation techniques to manage stress.  Learn and use controlled breathing techniques as directed by your health care provider. Controlled breathing techniques include:  Pursed lip breathing. Start by breathing in (inhaling) through your nose for 1 second. Then, purse your lips as if you were   going to whistle and breathe out (exhale) through the  pursed lips for 2 seconds.  Diaphragmatic breathing. Start by putting one hand on your abdomen just above your waist. Inhale slowly through your nose. The hand on your abdomen should move out. Then purse your lips and exhale slowly. You should be able to feel the hand on your abdomen moving in as you exhale.  Learn and use controlled coughing to clear mucus from your lungs. Controlled coughing is a series of short, progressive coughs. The steps of controlled coughing are: 1. Lean your head slightly forward. 2. Breathe in deeply using diaphragmatic breathing. 3. Try to hold your breath for 3 seconds. 4. Keep your mouth slightly open while coughing twice. 5. Spit any mucus out into a tissue. 6. Rest and repeat the steps once or twice as needed. SEEK MEDICAL CARE IF:  You are coughing up more mucus than usual.  There is a change in the color or thickness of your mucus.  Your breathing is more labored than usual.  Your breathing is faster than usual. SEEK IMMEDIATE MEDICAL CARE IF:  You have shortness of breath while you are resting.  You have shortness of breath that prevents you from:  Being able to talk.  Performing your usual physical activities.  You have chest pain lasting longer than 5 minutes.  Your skin color is more cyanotic than usual.  You measure low oxygen saturations for longer than 5 minutes with a pulse oximeter. MAKE SURE YOU:  Understand these instructions.  Will watch your condition.  Will get help right away if you are not doing well or get worse.   This information is not intended to replace advice given to you by your health care provider. Make sure you discuss any questions you have with your health care provider.   Document Released: 01/30/2005 Document Revised: 05/13/2014 Document Reviewed: 12/17/2012 Elsevier Interactive Patient Education 2016 Elsevier Inc.  

## 2015-05-17 ENCOUNTER — Telehealth: Payer: Self-pay

## 2015-05-17 NOTE — Telephone Encounter (Signed)
Informed pt that a coupon card would be left a desk for her to pick up. Nothing further needed.

## 2015-05-17 NOTE — Telephone Encounter (Signed)
Pt is calling regarding coupons for her inhaler. Please call.

## 2015-05-18 ENCOUNTER — Other Ambulatory Visit: Payer: Self-pay | Admitting: *Deleted

## 2015-05-18 MED ORDER — FLUTICASONE FUROATE-VILANTEROL 100-25 MCG/INH IN AEPB: 1.0000 | INHALATION_SPRAY | Freq: Every day | RESPIRATORY_TRACT | Status: DC

## 2015-05-18 MED ORDER — UMECLIDINIUM BROMIDE 62.5 MCG/INH IN AEPB: 1.0000 | INHALATION_SPRAY | Freq: Every day | RESPIRATORY_TRACT | Status: DC

## 2015-05-29 ENCOUNTER — Encounter: Payer: Self-pay | Admitting: Radiation Oncology

## 2015-05-29 ENCOUNTER — Ambulatory Visit
Admission: RE | Admit: 2015-05-29 | Discharge: 2015-05-29 | Disposition: A | Discharge status: Home / Self Care | Payer: Medicare Other | Source: Ambulatory Visit | Attending: Radiation Oncology | Admitting: Radiation Oncology

## 2015-05-29 VITALS — BP 127/86 | HR 84 | Temp 96.7°F | Resp 18 | Wt 171.5 lb

## 2015-05-29 DIAGNOSIS — C3411 Malignant neoplasm of upper lobe, right bronchus or lung: Secondary | ICD-10-CM

## 2015-05-29 NOTE — Progress Notes (Signed)
Radiation Oncology Follow up Note  Name: Cathy Watts   Date:   05/29/2015 MRN:  [id number] DOB: 23-Nov-1945    This 70 y.o. 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  REFERRING PROVIDER: Nestor Lewandowsky, MD  HPI: Patient is a 70 year old female now out 1 month having completed combined modality treatment for stage I a squamous cell carcinoma the right hilum. Treated with radiation therapy alone. She is seen today in routine follow-up and is doing well. She specifically denies hemoptysis or chest tightness. She does have a slight white productive cough which seems to be improving. By mouth intake is good no dysphagia.  COMPLICATIONS OF TREATMENT: none  FOLLOW UP COMPLIANCE: keeps appointments   PHYSICAL EXAM:  BP 127/86 mmHg  Pulse 84  Temp(Src) 96.7 F (35.9 C)  Resp 18  Wt 171 lb 8.3 oz (77.8 kg) Well-developed well-nourished patient in NAD. HEENT reveals PERLA, EOMI, discs not visualized.  Oral cavity is clear. No oral mucosal lesions are identified. Neck is clear without evidence of cervical or supraclavicular adenopathy. Lungs are clear to A&P. Cardiac examination is essentially unremarkable with regular rate and rhythm without murmur rub or thrill. Abdomen is benign with no organomegaly or masses noted. Motor sensory and DTR levels are equal and symmetric in the upper and lower extremities. Cranial nerves II through XII are grossly intact. Proprioception is intact. No peripheral adenopathy or edema is identified. No motor or sensory levels are noted. Crude visual fields are within normal range.  RADIOLOGY RESULTS: CT scan has been scheduled for end of the month  PLAN: At the present time she is doing well recovering nicely from her radiation therapy treatments. I'm please were overall progress. I will review her CT scan when it becomes available. I've asked to see her back in 3-4 months for  follow-up. She continues close follow-up care with medical oncology. Patient knows to call sooner with any concerns.  I would like to take this opportunity for allowing me to participate in the care of your patient.Armstead Peaks., MD

## 2015-06-05 ENCOUNTER — Ambulatory Visit
Admission: RE | Admit: 2015-06-05 | Discharge: 2015-06-05 | Disposition: A | Discharge status: Home / Self Care | Payer: Medicare Other | Source: Ambulatory Visit | Attending: Internal Medicine | Admitting: Internal Medicine

## 2015-06-05 DIAGNOSIS — C3411 Malignant neoplasm of upper lobe, right bronchus or lung: Secondary | ICD-10-CM

## 2015-06-05 DIAGNOSIS — I2510 Atherosclerotic heart disease of native coronary artery without angina pectoris: Secondary | ICD-10-CM | POA: Insufficient documentation

## 2015-06-05 DIAGNOSIS — C3490 Malignant neoplasm of unspecified part of unspecified bronchus or lung: Secondary | ICD-10-CM | POA: Diagnosis not present

## 2015-06-05 DIAGNOSIS — Z923 Personal history of irradiation: Secondary | ICD-10-CM | POA: Diagnosis not present

## 2015-06-05 LAB — POCT I-STAT CREATININE: Creatinine, Ser: 0.7 mg/dL (ref 0.44–1.00)

## 2015-06-05 MED ORDER — IOHEXOL 300 MG/ML  SOLN
75.0000 mL | Freq: Once | INTRAMUSCULAR | Status: AC | PRN
  Administered 2015-06-05: 75 mL via INTRAVENOUS

## 2015-06-06 ENCOUNTER — Other Ambulatory Visit: Payer: Self-pay | Admitting: Physician Assistant

## 2015-06-06 DIAGNOSIS — K219 Gastro-esophageal reflux disease without esophagitis: Secondary | ICD-10-CM

## 2015-06-07 ENCOUNTER — Encounter: Payer: Medicare Other | Admitting: Family Medicine

## 2015-06-10 DIAGNOSIS — J449 Chronic obstructive pulmonary disease, unspecified: Secondary | ICD-10-CM | POA: Diagnosis not present

## 2015-06-12 ENCOUNTER — Inpatient Hospital Stay (HOSPITAL_BASED_OUTPATIENT_CLINIC_OR_DEPARTMENT_OTHER): Payer: Medicare Other

## 2015-06-12 ENCOUNTER — Encounter: Payer: Self-pay | Admitting: Internal Medicine

## 2015-06-12 ENCOUNTER — Inpatient Hospital Stay: Payer: Medicare Other | Attending: Internal Medicine | Admitting: Internal Medicine

## 2015-06-12 VITALS — BP 117/74 | HR 85 | Temp 97.6°F | Resp 18 | Ht 71.0 in | Wt 172.4 lb

## 2015-06-12 DIAGNOSIS — K219 Gastro-esophageal reflux disease without esophagitis: Secondary | ICD-10-CM

## 2015-06-12 DIAGNOSIS — C3431 Malignant neoplasm of lower lobe, right bronchus or lung: Secondary | ICD-10-CM | POA: Insufficient documentation

## 2015-06-12 DIAGNOSIS — Z79899 Other long term (current) drug therapy: Secondary | ICD-10-CM | POA: Diagnosis not present

## 2015-06-12 DIAGNOSIS — Z923 Personal history of irradiation: Secondary | ICD-10-CM | POA: Insufficient documentation

## 2015-06-12 DIAGNOSIS — Z880 Allergy status to penicillin: Secondary | ICD-10-CM | POA: Diagnosis not present

## 2015-06-12 DIAGNOSIS — J449 Chronic obstructive pulmonary disease, unspecified: Secondary | ICD-10-CM

## 2015-06-12 DIAGNOSIS — F17210 Nicotine dependence, cigarettes, uncomplicated: Secondary | ICD-10-CM | POA: Insufficient documentation

## 2015-06-12 DIAGNOSIS — I2510 Atherosclerotic heart disease of native coronary artery without angina pectoris: Secondary | ICD-10-CM

## 2015-06-12 DIAGNOSIS — R0602 Shortness of breath: Secondary | ICD-10-CM | POA: Diagnosis not present

## 2015-06-12 DIAGNOSIS — J45909 Unspecified asthma, uncomplicated: Secondary | ICD-10-CM | POA: Insufficient documentation

## 2015-06-12 DIAGNOSIS — C3411 Malignant neoplasm of upper lobe, right bronchus or lung: Secondary | ICD-10-CM

## 2015-06-12 LAB — COMPREHENSIVE METABOLIC PANEL
ALBUMIN: 3.8 g/dL (ref 3.5–5.0)
ALT: 14 U/L (ref 14–54)
ANION GAP: 5 (ref 5–15)
AST: 14 U/L — AB (ref 15–41)
Alkaline Phosphatase: 63 U/L (ref 38–126)
BUN: 12 mg/dL (ref 6–20)
CHLORIDE: 99 mmol/L — AB (ref 101–111)
CO2: 28 mmol/L (ref 22–32)
Calcium: 9 mg/dL (ref 8.9–10.3)
Creatinine, Ser: 0.63 mg/dL (ref 0.44–1.00)
GFR calc Af Amer: >60 mL/min (ref >60)
GFR calc non Af Amer: >60 mL/min (ref >60)
GLUCOSE: 95 mg/dL (ref 65–99)
POTASSIUM: 3.5 mmol/L (ref 3.5–5.1)
SODIUM: 132 mmol/L — AB (ref 135–145)
Total Bilirubin: 0.4 mg/dL (ref 0.3–1.2)
Total Protein: 6.6 g/dL (ref 6.5–8.1)

## 2015-06-12 LAB — CBC WITH DIFFERENTIAL/PLATELET
Basophils Absolute: 0.1 10*3/uL (ref 0–0.1)
Basophils Relative: 1 %
EOS ABS: 0.2 10*3/uL (ref 0–0.7)
EOS PCT: 3 %
HCT: 40.5 % (ref 35.0–47.0)
Hemoglobin: 13.9 g/dL (ref 12.0–16.0)
LYMPHS ABS: 1.1 10*3/uL (ref 1.0–3.6)
LYMPHS PCT: 16 %
MCH: 31.9 pg (ref 26.0–34.0)
MCHC: 34.3 g/dL (ref 32.0–36.0)
MCV: 92.9 fL (ref 80.0–100.0)
MONO ABS: 1 10*3/uL — AB (ref 0.2–0.9)
MONOS PCT: 14 %
Neutro Abs: 4.5 10*3/uL (ref 1.4–6.5)
Neutrophils Relative %: 66 %
PLATELETS: 273 10*3/uL (ref 150–440)
RBC: 4.35 MIL/uL (ref 3.80–5.20)
RDW: 14.2 % (ref 11.5–14.5)
WBC: 7 10*3/uL (ref 3.6–11.0)

## 2015-06-12 NOTE — Progress Notes (Signed)
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   # COPD on 02 at night; hx of smoking.    HISTORY OF PRESENTING ILLNESS:  Cathy Watts 70 y.o.  female long-standing history of smoking COPD with squamous cell lung cancer stage I currently on definitive radiation is here for follow-up/ review the results of her restaging CAT scan.  Patient finished radiation approximately month ago.   patient denies any shortness of breath or chest pain. Denies any unusual cough or hemoptysis.  Admits to chronic shortness of breath. Otherwise denies any headaches or vision changes or any bone pain.   ROS: A complete 10 point review of system is done which is negative except mentioned above in history of present illness  MEDICAL HISTORY:  Past Medical History  Diagnosis Date  . Anxiety   . Asthma   . SOB (shortness of breath)   . Osteopenia   . Depression   . Panic disorder   . History of peptic ulcer disease   . Personal history of tobacco use, presenting hazards to health 01/16/2015  . Low oxygen saturation     2l hs  . Sleep apnea   . Squamous cell carcinoma of right lung ([place])   . COPD (chronic obstructive pulmonary disease) ([place])   . Barrett's esophagus   . GERD (gastroesophageal reflux disease)     SURGICAL HISTORY: Past Surgical History  Procedure Laterality Date  . Tubal ligation  1970's  . Cholecystectomy  1980's  . Elbow fracture surgery Right 2009  . Tonsillectomy  1979  . Cervical cone biopsy  1990  . Endobronchial ultrasound N/A 02/07/2015    Procedure: ENDOBRONCHIAL ULTRASOUND;  Surgeon: Flora Lipps, MD;  Location: [facility] ORS;  Service: Cardiopulmonary;   Laterality: N/A;  . Upper gi endoscopy    . Colonoscopy  2014    SOCIAL HISTORY: Social History   Social History  . Marital Status: Married    Spouse Name: N/A  . Number of Children: N/A  . Years of Education: N/A   Occupational History  . Not on file.   Social History Main Topics  . Smoking status: Current Every Day Smoker -- 0.00 packs/day for 55 years    Types: Cigarettes  . Smokeless tobacco: Never Used     Comment: used to use E-cig  . Alcohol Use: No  . Drug Use: No  . Sexual Activity: Not on file   Other Topics Concern  . Not on file   Social History Narrative    FAMILY HISTORY: Family History  Problem Relation Age of Onset  . Colon cancer Mother     colon  . Diabetes Mother   . Arthritis Mother   . Glaucoma Mother   . Stomach cancer Father     stomach  . Throat cancer Brother     throat  . Breast cancer Sister     breast  . Diabetes Sister   . Cirrhosis Sister   . Cancer Paternal Aunt   . Parkinson's disease Sister   . Heart attack Sister   . Ovarian cancer Sister     ALLERGIES:  is allergic to amoxicillin.  MEDICATIONS:  Current Outpatient Prescriptions  Medication Sig Dispense Refill  . albuterol (PROVENTIL HFA;VENTOLIN HFA) 108 (90 Base) MCG/ACT inhaler Inhale 2 puffs into the lungs every 4 (four) hours as needed for wheezing or shortness of breath (or 30 minutes before exercize). 1 Inhaler 10  . busPIRone (BUSPAR) 30 MG tablet Take 1 tablet by mouth 2 (two) times daily.    Marland Kitchen CALCIUM CARBONATE-VIT D-MIN PO Take 1 tablet by mouth 3 (three) times daily.    Marland Kitchen escitalopram (LEXAPRO) 20 MG tablet Take 1 tablet by mouth daily.    . Fluticasone Furoate-Vilanterol 100-25 MCG/INH AEPB Inhale 1 puff into the lungs daily. 60 each 5  . Influenza Vac Split High-Dose 0.5 ML SUSY Inject 0.5 mLs into the muscle once. 0.5 mL 0  . Omega-3 Fatty Acids (FISH OIL) 1200 MG CAPS Take 1 capsule by mouth daily.    Marland Kitchen omeprazole (PRILOSEC) 20 MG capsule TAKE ONE  CAPSULE BY MOUTH ONCE DAILY 90 capsule 3  . polyethylene glycol powder (GLYCOLAX/MIRALAX) powder Take 17 g by mouth daily.    . Pseudoephedrine-Guaifenesin (MUCINEX D) [phone] MG TB12 Take 1 tablet by mouth.    . Umeclidinium Bromide (INCRUSE ELLIPTA) 62.5 MCG/INH AEPB Inhale 1 puff into the lungs daily. 30 each 5  . amoxicillin-clavulanate (AUGMENTIN) 875-125 MG tablet Take 1 tablet by mouth 2 (two) times daily. (Patient not taking: Reported on 06/12/2015) 20 tablet 0  . clotrimazole-betamethasone (LOTRISONE) cream LOTRISONE, 1-0.05% (External Cream)  1 (one) Cream Cream Apply twice daily to affected area.  Not for Internal use.  (Correction from prev script said not for external use and should read , not for internal use) for 0 days  Quantity: 45;  Refills: 1   Ordered :22-Apr-2013  Renaldo Fiddler ;  Started 22-Apr-2013 Active    . pravastatin (PRAVACHOL) 20 MG tablet Take 1 tablet (20 mg total) by mouth at bedtime. (Patient not taking: Reported on 06/12/2015) 90 tablet 3   No current facility-administered medications for this visit.      Marland Kitchen  PHYSICAL EXAMINATION: ECOG PERFORMANCE STATUS: 1 - Symptomatic but completely ambulatory  Filed Vitals:   06/12/15 1458  BP: 117/74  Pulse: 85  Temp: 97.6 F (36.4 C)  Resp: 18   Filed Weights   06/12/15 1458  Weight: 172 lb 6.4 oz (78.2 kg)    GENERAL: Well-nourished well-developed; Alert, no distress and comfortable.   She is accompanied by family.  EYES: no pallor or icterus OROPHARYNX: no thrush or ulceration; good dentition  NECK: supple, no masses felt LYMPH:  no palpable lymphadenopathy in the cervical, axillary or inguinal regions LUNGS: clear to auscultation and  No wheeze or crackles HEART/CVS: regular rate & rhythm and no murmurs; No lower extremity edema ABDOMEN: abdomen soft, non-tender and normal bowel sounds Musculoskeletal:no cyanosis of digits and no clubbing  PSYCH: alert & oriented x 3 with fluent speech NEURO:  no focal motor/sensory deficits SKIN:  no rashes or significant lesions  LABORATORY DATA:  I have reviewed the data as listed Lab Results  Component Value Date   WBC 7.0 06/12/2015   HGB 13.9 06/12/2015   HCT 40.5 06/12/2015   MCV 92.9 06/12/2015   PLT 273 06/12/2015    Recent Labs  02/06/15 1053 06/05/15 1438 06/12/15 1441  NA 137  --  132*  K 4.4  --  3.5  CL 103  --  99*  CO2 27  --  28  GLUCOSE 92  --  95  BUN 13  --  12  CREATININE 0.63 0.70 0.63  CALCIUM 9.2  --  9.0  GFRNONAA >60  --  >60  GFRAA >60  --  >60  PROT  --   --  6.6  ALBUMIN  --   --  3.8  AST  --   --  14*  ALT  --   --  14  ALKPHOS  --   --  63  BILITOT  --   --  0.4    RADIOGRAPHIC STUDIES: I have personally reviewed the radiological images as listed and agreed with the findings in the report. Ct Chest W Contrast  06/05/2015  CLINICAL DATA:  Lung cancer, status post radiation therapy, restaging. EXAM: CT CHEST WITH CONTRAST TECHNIQUE: Multidetector CT imaging of the chest was performed during intravenous contrast administration. CONTRAST:  58m OMNIPAQUE IOHEXOL 300 MG/ML  SOLN COMPARISON:  PET 02/07/2015 and CT chest 01/27/2015. FINDINGS: Mediastinum/Nodes: No pathologically enlarged mediastinal, hilar or axillary lymph nodes. Atherosclerotic calcification of the arterial vasculature, including coronary arteries. Heart size normal. No pericardial effusion. Lungs/Pleura: Moderate centrilobular emphysema. Minimal scattered pulmonary nodularity and mucoid impaction in the upper lobes, as before. Previously seen soft tissue lesion in the right infrahilar region is no longer measurable. There is residual peribronchial soft tissue thickening with ground-glass, slight nodularity and mild coarsening in the right lower lobe. No pleural fluid. Airway is otherwise unremarkable. Upper abdomen: 8 mm low-attenuation lesion in the left hepatic lobe is unchanged. Visualized portions of the liver, adrenal glands,  kidneys, spleen, pancreas, stomach and bowel are otherwise grossly unremarkable. Cholecystectomy. No upper abdominal adenopathy. Musculoskeletal: No worrisome lytic or sclerotic lesions. Degenerative changes are seen in the spine. IMPRESSION: 1. Overall response to therapy as evidenced by residual peribronchial soft tissue thickening in the right lower lobe, at the site of previously seen soft tissue nodule. Associated residual ground-glass, nodularity and mild coarsening in the right lower lobe, likely the sequelae of previous obstruction. 2. Scattered nodularity throughout the lungs, nonspecific but continued attention on followup exams is warranted. 3. Coronary artery calcification. Electronically Signed   By: MLorin PicketM.D.   On: 06/05/2015 16:08    ASSESSMENT & PLAN:  # Squamous cell carcinoma of the lung stage I [ cT1aN0]; hilar lymph node eqivocal on PET scan. S/p definitive RT.  Imaging-  Does not show any residual disease;  No infrahilar adenopathy.  I reviewed the images myself reviewed the images with the patient and family in detail.  #  Given the complete response on the CT scan/  Likely stage I-  I would not recommend any adjuvant therapy.     #  I would recommend CT scan in approximately  4 months.  Discussed with the patient that based upon the results of the follow-up scan-  Treatments including chemotherapy  Or immunotherapy or radiation  Might be recommended.  The patient follow-up with me in approximately 4 months/ Labs/scan prior to visit.  # 25 minutes face-to-face with the patient discussing the above plan of care; more than 50% of time spent on prognosis/ natural history; counseling and coordination.    GCammie Sickle MD 06/12/2015 3:23 PM

## 2015-06-19 DIAGNOSIS — M7989 Other specified soft tissue disorders: Secondary | ICD-10-CM | POA: Diagnosis not present

## 2015-06-19 DIAGNOSIS — I872 Venous insufficiency (chronic) (peripheral): Secondary | ICD-10-CM | POA: Diagnosis not present

## 2015-06-19 DIAGNOSIS — I8311 Varicose veins of right lower extremity with inflammation: Secondary | ICD-10-CM | POA: Diagnosis not present

## 2015-06-19 DIAGNOSIS — M79609 Pain in unspecified limb: Secondary | ICD-10-CM | POA: Diagnosis not present

## 2015-06-19 DIAGNOSIS — I8312 Varicose veins of left lower extremity with inflammation: Secondary | ICD-10-CM | POA: Diagnosis not present

## 2015-07-03 DIAGNOSIS — M7989 Other specified soft tissue disorders: Secondary | ICD-10-CM | POA: Diagnosis not present

## 2015-07-03 DIAGNOSIS — I872 Venous insufficiency (chronic) (peripheral): Secondary | ICD-10-CM | POA: Diagnosis not present

## 2015-07-03 DIAGNOSIS — M79609 Pain in unspecified limb: Secondary | ICD-10-CM | POA: Diagnosis not present

## 2015-07-03 DIAGNOSIS — I83813 Varicose veins of bilateral lower extremities with pain: Secondary | ICD-10-CM | POA: Diagnosis not present

## 2015-07-08 DIAGNOSIS — J449 Chronic obstructive pulmonary disease, unspecified: Secondary | ICD-10-CM | POA: Diagnosis not present

## 2015-07-12 ENCOUNTER — Ambulatory Visit (INDEPENDENT_AMBULATORY_CARE_PROVIDER_SITE_OTHER): Payer: Medicare Other | Admitting: Family Medicine

## 2015-07-12 ENCOUNTER — Encounter: Payer: Self-pay | Admitting: Family Medicine

## 2015-07-12 VITALS — BP 104/60 | HR 92 | Temp 98.1°F | Resp 16 | Wt 172.0 lb

## 2015-07-12 DIAGNOSIS — B370 Candidal stomatitis: Secondary | ICD-10-CM | POA: Insufficient documentation

## 2015-07-12 DIAGNOSIS — J029 Acute pharyngitis, unspecified: Secondary | ICD-10-CM | POA: Diagnosis not present

## 2015-07-12 LAB — POCT RAPID STREP A (OFFICE): Rapid Strep A Screen: NEGATIVE

## 2015-07-12 MED ORDER — NYSTATIN 100000 UNIT/ML MT SUSP: 5.0000 mL | Freq: Four times a day (QID) | OROMUCOSAL | Status: DC

## 2015-07-12 MED ORDER — FLUCONAZOLE 100 MG PO TABS
100.0000 mg | ORAL_TABLET | Freq: Every day | ORAL | Status: DC
Start: 2015-07-12 — End: 2015-08-14

## 2015-07-12 NOTE — Progress Notes (Signed)
Subjective:    Patient ID: Cathy Watts, female    DOB: 1945-06-20, 70 y.o.   MRN: [id number]  Sore Throat  This is a new problem. The current episode started in the past 7 days (since Sunday). The problem has been gradually worsening. The pain is worse on the left side. There has been no fever. The pain is at a severity of 1/10. The pain is mild. Associated symptoms include congestion (COPD), coughing (COPD, unchanged), ear pain (left ), headaches (left ear), a hoarse voice, a plugged ear sensation, neck pain (left neck. ), shortness of breath (COPD, unchanged), swollen glands and trouble swallowing (hard food.  Does ok with soft food. ). Pertinent negatives include no abdominal pain, ear discharge or vomiting. She has had no exposure to strep or mono. Treatments tried: Chicken Soup, "eating soft foods", gargling salt water. Helped some.   The treatment provided mild relief.      Review of Systems  Constitutional: Positive for chills, activity change and appetite change. Negative for fever and unexpected weight change.  HENT: Positive for congestion (COPD), ear pain (left ), hoarse voice and trouble swallowing (hard food.  Does ok with soft food. ). Negative for ear discharge.   Respiratory: Positive for cough (COPD, unchanged) and shortness of breath (COPD, unchanged).   Gastrointestinal: Negative for vomiting and abdominal pain.  Musculoskeletal: Positive for neck pain (left neck. ).  Neurological: Positive for headaches (left ear).   BP 104/60 mmHg  Pulse 92  Temp(Src) 98.1 F (36.7 C) (Oral)  Resp 16  Wt 172 lb (78.019 kg)   Patient Active Problem List   Diagnosis Date Noted  . Lung cancer, upper lobe ([place]) 04/17/2015  . Lung mass   . Nonspecific abnormal findings on radiological and examination of lung field 01/17/2015  . Personal history of tobacco use, presenting hazards to health 01/16/2015  . Anxiety 11/15/2014  . Airway hyperreactivity 11/15/2014  . CN  (constipation) 11/15/2014  . Acute exacerbation of chronic obstructive airways disease ([place]) 11/15/2014  . Daytime somnolence 11/15/2014  . Clinical depression 11/15/2014  . DD (diverticular disease) 11/15/2014  . Accumulation of fluid in tissues 11/15/2014  . H/O peptic ulcer 11/15/2014  . Osteopenia 11/15/2014  . Awareness of heartbeats 11/15/2014  . Episodic paroxysmal anxiety disorder 11/15/2014  . Apnea, sleep 11/15/2014  . Snores 11/15/2014  . Breath shortness 11/15/2014  . Compulsive tobacco user syndrome 11/15/2014  . Avitaminosis D 11/15/2014  . Barrett esophagus 10/19/2014  . Acid reflux 10/19/2014  . Hypercholesterolemia 10/19/2014  . COPD, moderate ([place]) 08/31/2013  . Dyspnea 08/31/2013  . Tobacco use disorder 08/31/2013  . Otitis, externa, infective 08/31/2013  . Nocturnal hypoxemia 08/31/2013  . CAFL (chronic airflow limitation) ([place]) 09/17/2011  . Hypercholesterolemia without hypertriglyceridemia 11/05/2005   Past Medical History  Diagnosis Date  . Anxiety   . Asthma   . SOB (shortness of breath)   . Osteopenia   . Depression   . Panic disorder   . History of peptic ulcer disease   . Personal history of tobacco use, presenting hazards to health 01/16/2015  . Low oxygen saturation     2l hs  . Sleep apnea   . Squamous cell carcinoma of right lung ([place])   . COPD (chronic obstructive pulmonary disease) ([place])   . Barrett's esophagus   . GERD (gastroesophageal reflux disease)    Current Outpatient Prescriptions on File Prior to Visit  Medication Sig  . albuterol (PROVENTIL HFA;VENTOLIN HFA) 108 (90  Base) MCG/ACT inhaler Inhale 2 puffs into the lungs every 4 (four) hours as needed for wheezing or shortness of breath (or 30 minutes before exercize).  Marland Kitchen amoxicillin-clavulanate (AUGMENTIN) 875-125 MG tablet Take 1 tablet by mouth 2 (two) times daily.  . busPIRone (BUSPAR) 30 MG tablet Take 1 tablet by mouth 2 (two) times daily.  Marland Kitchen CALCIUM CARBONATE-VIT D-MIN PO  Take 1 tablet by mouth 3 (three) times daily.  . clotrimazole-betamethasone (LOTRISONE) cream LOTRISONE, 1-0.05% (External Cream)  1 (one) Cream Cream Apply twice daily to affected area.  Not for Internal use.  (Correction from prev script said not for external use and should read , not for internal use) for 0 days  Quantity: 45;  Refills: 1   Ordered :22-Apr-2013  Renaldo Fiddler ;  Started 22-Apr-2013 Active  . escitalopram (LEXAPRO) 20 MG tablet Take 1 tablet by mouth daily.  . Fluticasone Furoate-Vilanterol 100-25 MCG/INH AEPB Inhale 1 puff into the lungs daily.  . Influenza Vac Split High-Dose 0.5 ML SUSY Inject 0.5 mLs into the muscle once.  . Omega-3 Fatty Acids (FISH OIL) 1200 MG CAPS Take 1 capsule by mouth daily.  Marland Kitchen omeprazole (PRILOSEC) 20 MG capsule TAKE ONE CAPSULE BY MOUTH ONCE DAILY  . polyethylene glycol powder (GLYCOLAX/MIRALAX) powder Take 17 g by mouth daily.  . pravastatin (PRAVACHOL) 20 MG tablet Take 1 tablet (20 mg total) by mouth at bedtime.  . Pseudoephedrine-Guaifenesin (MUCINEX D) [phone] MG TB12 Take 1 tablet by mouth.  . Umeclidinium Bromide (INCRUSE ELLIPTA) 62.5 MCG/INH AEPB Inhale 1 puff into the lungs daily.   No current facility-administered medications on file prior to visit.   Allergies  Allergen Reactions  . Amoxicillin Hives and Itching   Past Surgical History  Procedure Laterality Date  . Tubal ligation  1970's  . Cholecystectomy  1980's  . Elbow fracture surgery Right 2009  . Tonsillectomy  1979  . Cervical cone biopsy  1990  . Endobronchial ultrasound N/A 02/07/2015    Procedure: ENDOBRONCHIAL ULTRASOUND;  Surgeon: Flora Lipps, MD;  Location: [facility] ORS;  Service: Cardiopulmonary;  Laterality: N/A;  . Upper gi endoscopy    . Colonoscopy  2014   Social History   Social History  . Marital Status: Married    Spouse Name: N/A  . Number of Children: N/A  . Years of Education: N/A   Occupational History  . Not on file.   Social History  Main Topics  . Smoking status: Current Every Day Smoker -- 1.25 packs/day for 55 years    Types: Cigarettes  . Smokeless tobacco: Never Used     Comment: used to use E-cig  . Alcohol Use: No  . Drug Use: No  . Sexual Activity: Not on file   Other Topics Concern  . Not on file   Social History Narrative   Family History  Problem Relation Age of Onset  . Colon cancer Mother     colon  . Diabetes Mother   . Arthritis Mother   . Glaucoma Mother   . Stomach cancer Father     stomach  . Throat cancer Brother     throat  . Breast cancer Sister     breast  . Diabetes Sister   . Cirrhosis Sister   . Cancer Paternal Aunt   . Parkinson's disease Sister   . Heart attack Sister   . Ovarian cancer Sister        Objective:   Physical Exam  Constitutional: She is oriented to  person, place, and time. She appears well-developed and well-nourished.  HENT:  Head: Normocephalic and atraumatic.  Right Ear: External ear normal.  Left Ear: External ear normal.  Mouth/Throat: Oropharyngeal exudate: some white spots on posterior pharynx noted.    Eyes: Conjunctivae and EOM are normal. Pupils are equal, round, and reactive to light.  Neck: Normal range of motion. Neck supple.  Cardiovascular: Normal rate and regular rhythm.   Pulmonary/Chest: Effort normal and breath sounds normal.  Neurological: She is alert and oriented to person, place, and time.  Psychiatric: She has a normal mood and affect. Her behavior is normal. Judgment and thought content normal.   BP 104/60 mmHg  Pulse 92  Temp(Src) 98.1 F (36.7 C) (Oral)  Resp 16  Wt 172 lb (78.019 kg)      Assessment & Plan:  1. Sore throat Strep negative.  - POCT rapid strep A  2. Oropharyngeal candidiasis New problem. History of steroid inhaler use, antibiotics and radiation treatment. Will treat. Patient instructed to call back if condition worsens or does not improve.    - fluconazole (DIFLUCAN) 100 MG tablet; Take 1 tablet  (100 mg total) by mouth daily.  Dispense: 7 tablet; Refill:  - nystatin (MYCOSTATIN) 100000 UNIT/ML suspension; Take 5 mLs (500,000 Units total) by mouth 4 (four) times daily.  Dispense: 60 mL; Refill: 0   Margarita Rana, MD

## 2015-07-20 ENCOUNTER — Telehealth: Payer: Self-pay | Admitting: Family Medicine

## 2015-07-20 DIAGNOSIS — B370 Candidal stomatitis: Secondary | ICD-10-CM

## 2015-07-20 MED ORDER — NYSTATIN 100000 UNIT/ML MT SUSP: 5.0000 mL | Freq: Four times a day (QID) | OROMUCOSAL | Status: DC

## 2015-07-20 NOTE — Telephone Encounter (Signed)
Sent rx. Please notify patient. Thanks.

## 2015-07-20 NOTE — Telephone Encounter (Signed)
Pt states she was in the office last week with burning and swelling in her throat.  Pt states she was getting better while using the medication.  Pt states has taken all medication and now the burning and swelling is back in her throat.  Pt is requesting another Rx.  [place]  QZ#[phone]/AU

## 2015-08-08 DIAGNOSIS — J449 Chronic obstructive pulmonary disease, unspecified: Secondary | ICD-10-CM | POA: Diagnosis not present

## 2015-08-08 DIAGNOSIS — H524 Presbyopia: Secondary | ICD-10-CM | POA: Diagnosis not present

## 2015-08-08 DIAGNOSIS — H2513 Age-related nuclear cataract, bilateral: Secondary | ICD-10-CM | POA: Diagnosis not present

## 2015-08-14 ENCOUNTER — Ambulatory Visit (INDEPENDENT_AMBULATORY_CARE_PROVIDER_SITE_OTHER): Payer: Medicare Other | Admitting: Internal Medicine

## 2015-08-14 ENCOUNTER — Encounter: Payer: Self-pay | Admitting: Internal Medicine

## 2015-08-14 VITALS — BP 124/68 | HR 94 | Ht 70.0 in | Wt 173.2 lb

## 2015-08-14 DIAGNOSIS — J449 Chronic obstructive pulmonary disease, unspecified: Secondary | ICD-10-CM

## 2015-08-14 MED ORDER — UMECLIDINIUM BROMIDE 62.5 MCG/INH IN AEPB: 1.0000 | INHALATION_SPRAY | Freq: Every day | RESPIRATORY_TRACT | Status: AC

## 2015-08-14 MED ORDER — FLUTICASONE FUROATE-VILANTEROL 100-25 MCG/INH IN AEPB: 1.0000 | INHALATION_SPRAY | Freq: Every day | RESPIRATORY_TRACT | Status: AC

## 2015-08-14 NOTE — Progress Notes (Signed)
   Subjective:    Patient ID: Cathy Watts, female    DOB: 1946-04-19, 70 y.o.   MRN: [id number]  Synopsis: Gold Grade B with moderate airflow obstruction 2015, still smoking as of 05/2015 Recent DX of SQ cell Lung cancer  CC: no new complaints  HPI  Patient with chronic  SOB with exertion,intermittent  cough and chest congestion Still smokes cigarettes she wears oxygen at night 2 L [place] No signs of infection at this time  Review of Systems  Constitutional: Negative for fever, chills and fatigue.  HENT: Negative for congestion.   Respiratory: Positive for shortness of breath. Negative for cough, choking, chest tightness and wheezing.   Cardiovascular: Negative for chest pain and leg swelling.  Gastrointestinal: Negative.   All other systems reviewed and are negative.      Objective:   Physical Exam  Constitutional: She is oriented to person, place, and time. She appears well-developed and well-nourished. No distress.  HENT:  Head: Normocephalic and atraumatic.  Eyes: EOM are normal. Pupils are equal, round, and reactive to light.  Neck: Normal range of motion.  Cardiovascular: Normal rate, regular rhythm and normal heart sounds.   Pulmonary/Chest: Effort normal. No stridor. No respiratory distress. She has no wheezes. She has no rales.  Abdominal: Soft.  Neurological: She is alert and oriented to person, place, and time.  Skin: She is not diaphoretic.   Filed Vitals:   08/14/15 1122  BP: 124/68  Pulse: 94  Height: '5\' 10"'$  (1.778 m)  Weight: 173 lb 3.2 oz (78.563 kg)  SpO2: 97%  RA       Assessment & Plan:    70 yo white female SEP 2016- SQUAMOUS CELL LUNG CA STAGE IA with COPD    1.continue Breo and incruse for acute bronchospasms 2.continue oxygen therapy at night with 2 L , albuterol as needed 3.smoking cessation strongly advised  Follow up in 3 months after she sees hem onc  The Patient requires high complexity decision making for assessment and  support, frequent evaluation and titration of therapies. Patient/Family are satisfied with Plan of action and management. All questions answered  Corrin Parker, M.D.  [facility]  Medical Director [place] Director [facility] Cardio-Pulmonary Department

## 2015-08-14 NOTE — Patient Instructions (Signed)
Chronic Obstructive Pulmonary Disease Chronic obstructive pulmonary disease (COPD) is a common lung condition in which airflow from the lungs is limited. COPD is a general term that can be used to describe many different lung problems that limit airflow, including both chronic bronchitis and emphysema. If you have COPD, your lung function will probably never return to normal, but there are measures you can take to improve lung function and make yourself feel better. CAUSES   Smoking (common).  Exposure to secondhand smoke.  Genetic problems.  Chronic inflammatory lung diseases or recurrent infections. SYMPTOMS  Shortness of breath, especially with physical activity.  Deep, persistent (chronic) cough with a large amount of thick mucus.  Wheezing.  Rapid breaths (tachypnea).  Gray or bluish discoloration (cyanosis) of the skin, especially in your fingers, toes, or lips.  Fatigue.  Weight loss.  Frequent infections or episodes when breathing symptoms become much worse (exacerbations).  Chest tightness. DIAGNOSIS Your health care provider will take a medical history and perform a physical examination to diagnose COPD. Additional tests for COPD may include:  Lung (pulmonary) function tests.  Chest X-ray.  CT scan.  Blood tests. TREATMENT  Treatment for COPD may include:  Inhaler and nebulizer medicines. These help manage the symptoms of COPD and make your breathing more comfortable.  Supplemental oxygen. Supplemental oxygen is only helpful if you have a low oxygen level in your blood.  Exercise and physical activity. These are beneficial for nearly all people with COPD.  Lung surgery or transplant.  Nutrition therapy to gain weight, if you are underweight.  Pulmonary rehabilitation. This may involve working with a team of health care providers and specialists, such as respiratory, occupational, and physical therapists. HOME CARE INSTRUCTIONS  Take all medicines  (inhaled or pills) as directed by your health care provider.  Avoid over-the-counter medicines or cough syrups that dry up your airway (such as antihistamines) and slow down the elimination of secretions unless instructed otherwise by your health care provider.  If you are a smoker, the most important thing that you can do is stop smoking. Continuing to smoke will cause further lung damage and breathing trouble. Ask your health care provider for help with quitting smoking. He or she can direct you to community resources or hospitals that provide support.  Avoid exposure to irritants such as smoke, chemicals, and fumes that aggravate your breathing.  Use oxygen therapy and pulmonary rehabilitation if directed by your health care provider. If you require home oxygen therapy, ask your health care provider whether you should purchase a pulse oximeter to measure your oxygen level at home.  Avoid contact with individuals who have a contagious illness.  Avoid extreme temperature and humidity changes.  Eat healthy foods. Eating smaller, more frequent meals and resting before meals may help you maintain your strength.  Stay active, but balance activity with periods of rest. Exercise and physical activity will help you maintain your ability to do things you want to do.  Preventing infection and hospitalization is very important when you have COPD. Make sure to receive all the vaccines your health care provider recommends, especially the pneumococcal and influenza vaccines. Ask your health care provider whether you need a pneumonia vaccine.  Learn and use relaxation techniques to manage stress.  Learn and use controlled breathing techniques as directed by your health care provider. Controlled breathing techniques include:  Pursed lip breathing. Start by breathing in (inhaling) through your nose for 1 second. Then, purse your lips as if you were   going to whistle and breathe out (exhale) through the  pursed lips for 2 seconds.  Diaphragmatic breathing. Start by putting one hand on your abdomen just above your waist. Inhale slowly through your nose. The hand on your abdomen should move out. Then purse your lips and exhale slowly. You should be able to feel the hand on your abdomen moving in as you exhale.  Learn and use controlled coughing to clear mucus from your lungs. Controlled coughing is a series of short, progressive coughs. The steps of controlled coughing are: 1. Lean your head slightly forward. 2. Breathe in deeply using diaphragmatic breathing. 3. Try to hold your breath for 3 seconds. 4. Keep your mouth slightly open while coughing twice. 5. Spit any mucus out into a tissue. 6. Rest and repeat the steps once or twice as needed. SEEK MEDICAL CARE IF:  You are coughing up more mucus than usual.  There is a change in the color or thickness of your mucus.  Your breathing is more labored than usual.  Your breathing is faster than usual. SEEK IMMEDIATE MEDICAL CARE IF:  You have shortness of breath while you are resting.  You have shortness of breath that prevents you from:  Being able to talk.  Performing your usual physical activities.  You have chest pain lasting longer than 5 minutes.  Your skin color is more cyanotic than usual.  You measure low oxygen saturations for longer than 5 minutes with a pulse oximeter. MAKE SURE YOU:  Understand these instructions.  Will watch your condition.  Will get help right away if you are not doing well or get worse.   This information is not intended to replace advice given to you by your health care provider. Make sure you discuss any questions you have with your health care provider.   Document Released: 01/30/2005 Document Revised: 05/13/2014 Document Reviewed: 12/17/2012 Elsevier Interactive Patient Education 2016 Elsevier Inc.  

## 2015-09-07 DIAGNOSIS — J449 Chronic obstructive pulmonary disease, unspecified: Secondary | ICD-10-CM | POA: Diagnosis not present

## 2015-09-21 ENCOUNTER — Other Ambulatory Visit: Payer: Self-pay | Admitting: Family Medicine

## 2015-09-21 DIAGNOSIS — F419 Anxiety disorder, unspecified: Secondary | ICD-10-CM

## 2015-09-21 NOTE — Telephone Encounter (Signed)
Last AWV 04/25/2015. Renaldo Fiddler, CMA

## 2015-09-27 ENCOUNTER — Telehealth: Payer: Self-pay | Admitting: Family Medicine

## 2015-09-27 NOTE — Telephone Encounter (Signed)
error 

## 2015-10-02 ENCOUNTER — Other Ambulatory Visit: Payer: Self-pay | Admitting: Physician Assistant

## 2015-10-02 DIAGNOSIS — K5900 Constipation, unspecified: Secondary | ICD-10-CM

## 2015-10-05 ENCOUNTER — Ambulatory Visit
Admission: RE | Admit: 2015-10-05 | Discharge: 2015-10-05 | Disposition: A | Discharge status: Home / Self Care | Payer: Medicare Other | Source: Ambulatory Visit | Attending: Internal Medicine | Admitting: Internal Medicine

## 2015-10-05 DIAGNOSIS — J219 Acute bronchiolitis, unspecified: Secondary | ICD-10-CM | POA: Diagnosis not present

## 2015-10-05 DIAGNOSIS — I2510 Atherosclerotic heart disease of native coronary artery without angina pectoris: Secondary | ICD-10-CM | POA: Insufficient documentation

## 2015-10-05 DIAGNOSIS — J849 Interstitial pulmonary disease, unspecified: Secondary | ICD-10-CM | POA: Diagnosis not present

## 2015-10-05 DIAGNOSIS — R911 Solitary pulmonary nodule: Secondary | ICD-10-CM | POA: Diagnosis not present

## 2015-10-05 DIAGNOSIS — C3431 Malignant neoplasm of lower lobe, right bronchus or lung: Secondary | ICD-10-CM | POA: Insufficient documentation

## 2015-10-05 DIAGNOSIS — R918 Other nonspecific abnormal finding of lung field: Secondary | ICD-10-CM | POA: Diagnosis not present

## 2015-10-05 DIAGNOSIS — J439 Emphysema, unspecified: Secondary | ICD-10-CM | POA: Insufficient documentation

## 2015-10-05 LAB — POCT I-STAT CREATININE: CREATININE: 0.7 mg/dL (ref 0.44–1.00)

## 2015-10-05 MED ORDER — IOPAMIDOL (ISOVUE-300) INJECTION 61%
75.0000 mL | Freq: Once | INTRAVENOUS | Status: AC | PRN
  Administered 2015-10-05: 75 mL via INTRAVENOUS

## 2015-10-08 DIAGNOSIS — J449 Chronic obstructive pulmonary disease, unspecified: Secondary | ICD-10-CM | POA: Diagnosis not present

## 2015-10-09 ENCOUNTER — Inpatient Hospital Stay: Payer: Medicare Other | Attending: Internal Medicine

## 2015-10-09 ENCOUNTER — Encounter: Payer: Self-pay | Admitting: Radiation Oncology

## 2015-10-09 ENCOUNTER — Ambulatory Visit
Admission: RE | Admit: 2015-10-09 | Discharge: 2015-10-09 | Disposition: A | Discharge status: Home / Self Care | Payer: Medicare Other | Source: Ambulatory Visit | Attending: Radiation Oncology | Admitting: Radiation Oncology

## 2015-10-09 ENCOUNTER — Other Ambulatory Visit: Payer: Self-pay | Admitting: *Deleted

## 2015-10-09 ENCOUNTER — Inpatient Hospital Stay (HOSPITAL_BASED_OUTPATIENT_CLINIC_OR_DEPARTMENT_OTHER): Payer: Medicare Other | Admitting: Internal Medicine

## 2015-10-09 VITALS — BP 115/75 | HR 49 | Temp 97.1°F | Resp 18 | Wt 178.8 lb

## 2015-10-09 VITALS — BP 124/78 | HR 70 | Temp 96.7°F | Resp 18 | Wt 179.2 lb

## 2015-10-09 DIAGNOSIS — Z79899 Other long term (current) drug therapy: Secondary | ICD-10-CM | POA: Diagnosis not present

## 2015-10-09 DIAGNOSIS — R911 Solitary pulmonary nodule: Secondary | ICD-10-CM | POA: Diagnosis not present

## 2015-10-09 DIAGNOSIS — F418 Other specified anxiety disorders: Secondary | ICD-10-CM | POA: Diagnosis not present

## 2015-10-09 DIAGNOSIS — C3411 Malignant neoplasm of upper lobe, right bronchus or lung: Secondary | ICD-10-CM

## 2015-10-09 DIAGNOSIS — M8580 Other specified disorders of bone density and structure, unspecified site: Secondary | ICD-10-CM

## 2015-10-09 DIAGNOSIS — J449 Chronic obstructive pulmonary disease, unspecified: Secondary | ICD-10-CM | POA: Diagnosis not present

## 2015-10-09 DIAGNOSIS — Z803 Family history of malignant neoplasm of breast: Secondary | ICD-10-CM | POA: Diagnosis not present

## 2015-10-09 DIAGNOSIS — C3431 Malignant neoplasm of lower lobe, right bronchus or lung: Secondary | ICD-10-CM | POA: Insufficient documentation

## 2015-10-09 DIAGNOSIS — K219 Gastro-esophageal reflux disease without esophagitis: Secondary | ICD-10-CM | POA: Insufficient documentation

## 2015-10-09 DIAGNOSIS — Z8711 Personal history of peptic ulcer disease: Secondary | ICD-10-CM | POA: Diagnosis not present

## 2015-10-09 DIAGNOSIS — Z800 Family history of malignant neoplasm of digestive organs: Secondary | ICD-10-CM | POA: Diagnosis not present

## 2015-10-09 DIAGNOSIS — F17210 Nicotine dependence, cigarettes, uncomplicated: Secondary | ICD-10-CM | POA: Diagnosis not present

## 2015-10-09 DIAGNOSIS — Z923 Personal history of irradiation: Secondary | ICD-10-CM

## 2015-10-09 DIAGNOSIS — R0602 Shortness of breath: Secondary | ICD-10-CM

## 2015-10-09 LAB — COMPREHENSIVE METABOLIC PANEL
ALBUMIN: 4.3 g/dL (ref 3.5–5.0)
ALK PHOS: 66 U/L (ref 38–126)
ALT: 12 U/L — ABNORMAL LOW (ref 14–54)
ANION GAP: 5 (ref 5–15)
AST: 15 U/L (ref 15–41)
BUN: 11 mg/dL (ref 6–20)
CALCIUM: 9.3 mg/dL (ref 8.9–10.3)
CHLORIDE: 101 mmol/L (ref 101–111)
CO2: 29 mmol/L (ref 22–32)
Creatinine, Ser: 0.63 mg/dL (ref 0.44–1.00)
GFR calc non Af Amer: >60 mL/min (ref >60)
GLUCOSE: 96 mg/dL (ref 65–99)
POTASSIUM: 4 mmol/L (ref 3.5–5.1)
SODIUM: 135 mmol/L (ref 135–145)
Total Bilirubin: 0.6 mg/dL (ref 0.3–1.2)
Total Protein: 7.3 g/dL (ref 6.5–8.1)

## 2015-10-09 LAB — CBC WITH DIFFERENTIAL/PLATELET
BASOS PCT: 1 %
Basophils Absolute: 0.1 10*3/uL (ref 0–0.1)
EOS ABS: 0.2 10*3/uL (ref 0–0.7)
EOS PCT: 3 %
HCT: 42.1 % (ref 35.0–47.0)
Hemoglobin: 14.5 g/dL (ref 12.0–16.0)
LYMPHS ABS: 1.3 10*3/uL (ref 1.0–3.6)
Lymphocytes Relative: 20 %
MCH: 31.9 pg (ref 26.0–34.0)
MCHC: 34.5 g/dL (ref 32.0–36.0)
MCV: 92.4 fL (ref 80.0–100.0)
MONO ABS: 0.9 10*3/uL (ref 0.2–0.9)
MONOS PCT: 13 %
Neutro Abs: 4.1 10*3/uL (ref 1.4–6.5)
Neutrophils Relative %: 63 %
PLATELETS: 248 10*3/uL (ref 150–440)
RBC: 4.56 MIL/uL (ref 3.80–5.20)
RDW: 14.2 % (ref 11.5–14.5)
WBC: 6.6 10*3/uL (ref 3.6–11.0)

## 2015-10-09 NOTE — Progress Notes (Signed)
Radiation Oncology Follow up Note  Name: Cathy Watts   Date:   10/09/2015 MRN:  [id number] DOB: 1945/07/28    This 70 y.o. female presents to the clinic today for follow-up for. Stage IA (T1 CN 0 M0) squamous cell carcinoma the right hilum   REFERRING PROVIDER: Nestor Lewandowsky, MD  HPI: Patient is a 70 year old female now out 6 months having completedIMRT radiation therapy to her right hilum for a stage TIc squamous cell carcinoma. She is seen today in routine follow-up is doing well. She continues to smoke. She specifically denies cough hemoptysis or chest tightness. She had a recent CT scan. Showing no residual or recurrent right lower lobe tumor. She does have some patchy groundglass centrilobular micro-nodularity favoring interstitial lung disease from smoking. I again have emphasized she needs to stop smoking.  COMPLICATIONS OF TREATMENT: none  FOLLOW UP COMPLIANCE: keeps appointments   PHYSICAL EXAM:  BP 124/78 mmHg  Pulse 70  Temp(Src) 96.7 F (35.9 C)  Resp 18  Wt 179 lb 3.7 oz (81.3 kg) Well-developed well-nourished patient in NAD. HEENT reveals PERLA, EOMI, discs not visualized.  Oral cavity is clear. No oral mucosal lesions are identified. Neck is clear without evidence of cervical or supraclavicular adenopathy. Lungs are clear to A&P. Cardiac examination is essentially unremarkable with regular rate and rhythm without murmur rub or thrill. Abdomen is benign with no organomegaly or masses noted. Motor sensory and DTR levels are equal and symmetric in the upper and lower extremities. Cranial nerves II through XII are grossly intact. Proprioception is intact. No peripheral adenopathy or edema is identified. No motor or sensory levels are noted. Crude visual fields are within normal range.  RADIOLOGY RESULTS: Serial CT scans are reviewed and compatible with the above-stated findings  PLAN: Present time she is doing well with excellent radiologic evidence of complete response.  Again I've emphasized and offered assistance for smoking sensation. Patient emphatically declares she will be stopping smoking. I've asked to see her back in 6 months and will repeat a CT scan with contrast prior to that visit. Patient knows to call sooner with any concerns.  I would like to take this opportunity to thank you for allowing me to participate in the care of your patient.Armstead Peaks., MD

## 2015-10-09 NOTE — Progress Notes (Signed)
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   # COPD on 02 at night; hx of smoking.    HISTORY OF PRESENTING ILLNESS:  Cathy Watts 69 y.o.  female long-standing history of smoking COPD with squamous cell lung cancer stage I currently on definitive radiation is here for follow-up/ review the results of her restaging CAT scan.  ....    patient denies any shortness of breath or chest pain. Denies any unusual cough or hemoptysis.  Admits to chronic shortness of breath. Otherwise denies any headaches or vision changes or any bone pain.   ROS: A complete 10 point review of system is done which is negative except mentioned above in history of present illness  MEDICAL HISTORY:  Past Medical History  Diagnosis Date  . Anxiety   . Asthma   . SOB (shortness of breath)   . Osteopenia   . Depression   . Panic disorder   . History of peptic ulcer disease   . Personal history of tobacco use, presenting hazards to health 01/16/2015  . Low oxygen saturation     2l hs  . Sleep apnea   . Squamous cell carcinoma of right lung ([place])   . COPD (chronic obstructive pulmonary disease) ([place])   . Barrett's esophagus   . GERD (gastroesophageal reflux disease)     SURGICAL HISTORY: Past Surgical History  Procedure Laterality Date  . Tubal ligation  1970's  . Cholecystectomy  1980's  . Elbow fracture surgery Right 2009  . Tonsillectomy  1979  . Cervical cone biopsy  1990  . Endobronchial ultrasound N/A 02/07/2015    Procedure: ENDOBRONCHIAL ULTRASOUND;  Surgeon: Flora Lipps, MD;  Location: [facility] ORS;  Service: Cardiopulmonary;  Laterality: N/A;  . Upper gi endoscopy    .  Colonoscopy  2014    SOCIAL HISTORY: Social History   Social History  . Marital Status: Married    Spouse Name: N/A  . Number of Children: N/A  . Years of Education: N/A   Occupational History  . Not on file.   Social History Main Topics  . Smoking status: Current Every Day Smoker -- 1.25 packs/day for 55 years    Types: Cigarettes  . Smokeless tobacco: Never Used     Comment: used to use E-cig  . Alcohol Use: No  . Drug Use: No  . Sexual Activity: Not on file   Other Topics Concern  . Not on file   Social History Narrative    FAMILY HISTORY: Family History  Problem Relation Age of Onset  . Colon cancer Mother     colon  . Diabetes Mother   . Arthritis Mother   . Glaucoma Mother   . Stomach cancer Father     stomach  . Throat cancer Brother     throat  . Breast cancer Sister     breast  . Diabetes Sister   . Cirrhosis Sister   . Cancer Paternal Aunt   . Parkinson's disease Sister   . Heart attack Sister   . Ovarian cancer Sister     ALLERGIES:  is allergic to amoxicillin.  MEDICATIONS:  Current Outpatient Prescriptions  Medication Sig Dispense  Refill  . albuterol (PROVENTIL HFA;VENTOLIN HFA) 108 (90 Base) MCG/ACT inhaler Inhale 2 puffs into the lungs every 4 (four) hours as needed for wheezing or shortness of breath (or 30 minutes before exercize). 1 Inhaler 10  . busPIRone (BUSPAR) 30 MG tablet TAKE ONE TABLET BY MOUTH TWICE DAILY 60 tablet 11  . CALCIUM CARBONATE-VIT D-MIN PO Take 1 tablet by mouth 3 (three) times daily.    . clotrimazole-betamethasone (LOTRISONE) cream LOTRISONE, 1-0.05% (External Cream)  1 (one) Cream Cream Apply twice daily to affected area.  Not for Internal use.  (Correction from prev script said not for external use and should read , not for internal use) for 0 days  Quantity: 45;  Refills: 1   Ordered :22-Apr-2013  Renaldo Fiddler ;  Started 22-Apr-2013 Active    . escitalopram (LEXAPRO) 20 MG tablet Take 1 tablet by mouth  daily.    . Fluticasone Furoate-Vilanterol 100-25 MCG/INH AEPB Inhale 1 puff into the lungs daily. 60 each 5  . Influenza Vac Split High-Dose 0.5 ML SUSY Inject 0.5 mLs into the muscle once. 0.5 mL 0  . Omega-3 Fatty Acids (FISH OIL) 1200 MG CAPS Take 1 capsule by mouth daily.    Marland Kitchen omeprazole (PRILOSEC) 20 MG capsule TAKE ONE CAPSULE BY MOUTH ONCE DAILY 90 capsule 3  . polyethylene glycol powder (GLYCOLAX/MIRALAX) powder TAKE 17 GRAMS OF POWDER, MIXED IN 8 OUNCES OF WATER, BY MOUTH ONCE A DAY. 250 g 0  . pravastatin (PRAVACHOL) 20 MG tablet Take 1 tablet (20 mg total) by mouth at bedtime. 90 tablet 3  . Pseudoephedrine-Guaifenesin (MUCINEX D) [phone] MG TB12 Take 1 tablet by mouth 2 (two) times daily.     Marland Kitchen Umeclidinium Bromide (INCRUSE ELLIPTA) 62.5 MCG/INH AEPB Inhale 1 puff into the lungs daily. 30 each 5   No current facility-administered medications for this visit.      Marland Kitchen  PHYSICAL EXAMINATION: ECOG PERFORMANCE STATUS: 1 - Symptomatic but completely ambulatory  Filed Vitals:   10/09/15 1501  BP: 115/75  Pulse: 49  Temp: 97.1 F (36.2 C)  Resp: 18   Filed Weights   10/09/15 1501  Weight: 178 lb 12.7 oz (81.1 kg)    GENERAL: Well-nourished well-developed; Alert, no distress and comfortable.   She is accompanied  By her daughter. EYES: no pallor or icterus OROPHARYNX: no thrush or ulceration; good dentition  NECK: supple, no masses felt LYMPH:  no palpable lymphadenopathy in the cervical, axillary or inguinal regions LUNGS: clear to auscultation and  No wheeze or crackles HEART/CVS: regular rate & rhythm and no murmurs; No lower extremity edema ABDOMEN: abdomen soft, non-tender and normal bowel sounds Musculoskeletal:no cyanosis of digits and no clubbing  PSYCH: alert & oriented x 3 with fluent speech NEURO: no focal motor/sensory deficits SKIN:  no rashes or significant lesions  LABORATORY DATA:  I have reviewed the data as listed Lab Results  Component Value Date    WBC 6.6 10/09/2015   HGB 14.5 10/09/2015   HCT 42.1 10/09/2015   MCV 92.4 10/09/2015   PLT 248 10/09/2015    Recent Labs  02/06/15 1053  06/12/15 1441 10/05/15 0918 10/09/15 1445  NA 137  --  132*  --  135  K 4.4  --  3.5  --  4.0  CL 103  --  99*  --  101  CO2 27  --  28  --  29  GLUCOSE 92  --  95  --  96  BUN 13  --  12  --  11  CREATININE 0.63  < > 0.63 0.70 0.63  CALCIUM 9.2  --  9.0  --  9.3  GFRNONAA >60  --  >60  --  >60  GFRAA >60  --  >60  --  >60  PROT  --   --  6.6  --  7.3  ALBUMIN  --   --  3.8  --  4.3  AST  --   --  14*  --  15  ALT  --   --  14  --  12*  ALKPHOS  --   --  63  --  66  BILITOT  --   --  0.4  --  0.6  < > = values in this interval not displayed.  RADIOGRAPHIC STUDIES: I have personally reviewed the radiological images as listed and agreed with the findings in the report. Ct Chest W Contrast  10/05/2015  CLINICAL DATA:  Restaging right lower lobe squamous cell carcinoma diagnosed 02/07/2015, status post radiation therapy. EXAM: CT CHEST WITH CONTRAST TECHNIQUE: Multidetector CT imaging of the chest was performed during intravenous contrast administration. CONTRAST:  87m ISOVUE-300 IOPAMIDOL (ISOVUE-300) INJECTION 61% COMPARISON:  06/05/2015 chest CT.  02/07/2015 PET-CT. FINDINGS: Mediastinum/Nodes: Normal heart size. No pericardial fluid/thickening. Left anterior descending and left circumflex coronary atherosclerosis. Great vessels are normal in course and caliber. No central pulmonary emboli. Normal visualized thyroid. Normal esophagus. No pathologically enlarged axillary, mediastinal or hilar lymph nodes. Lungs/Pleura: No pneumothorax. No pleural effusion. Mild centrilobular and paraseptal emphysema with mild diffuse bronchial wall thickening. Fissural thickening in the medial right major fissure and patchy reticulation and ground-glass attenuation in the central right lower lobe are favored to represent evolving postradiation change. No residual  or recurrent tumor in the central right lower lobe. Patchy centrilobular micronodularity throughout both lungs, upper lobe predominant, predominantly ground-glass density, including a new 3 mm solid pulmonary nodule in the posterior central right upper lobe (series 3/ image 66), otherwise not appreciably changed. No acute consolidative airspace disease. Upper abdomen: Simple 0.8 cm lateral segment left liver lobe cyst. Cholecystectomy. Stable mild irregular adrenal thickening without discrete adrenal nodule. Musculoskeletal: No aggressive appearing focal osseous lesions. Mild degenerative changes in the thoracic spine. IMPRESSION: 1. No residual or recurrent right lower lobe tumor. Evolving mild postradiation change in the central right lower lobe. 2. No findings convincing for metastatic disease in the chest. 3. Patchy predominantly ground-glass centrilobular micronodularity throughout both lungs, upper lobe predominant, including a new 3 mm central right upper lobe pulmonary nodule, favor smoking related interstitial lung disease (respiratory bronchiolitis) in this patient with a reported current smoking history. Continued attention to these tiny nodules recommended on follow-up chest CT. 4. Two vessel coronary atherosclerosis. 5. Mild emphysema and diffuse bronchial wall thickening, suggesting COPD. Electronically Signed   By: JIlona SorrelM.D.   On: 10/05/2015 09:45    ASSESSMENT & PLAN:  # Squamous cell carcinoma of the lung stage I [ cT1aN0]; hilar lymph node eqivocal on PET scan. S/p definitive RT.  CT June 2017-  No recurrent   Right lower lobe tumor noted.   #  Patchy bilateral groundglass nodularity/ right upper lobe 3 mm lung nodule-  Likely infectious/ bronchiolitis.  Recommend CT scan in 6 months. This has been ordered through radiation oncology.  #  Patient follow-up with [facility] approximately 6 months.      GCammie Sickle MD 10/09/2015 3:13 PM

## 2015-10-12 DIAGNOSIS — M79609 Pain in unspecified limb: Secondary | ICD-10-CM | POA: Diagnosis not present

## 2015-10-12 DIAGNOSIS — M7989 Other specified soft tissue disorders: Secondary | ICD-10-CM | POA: Diagnosis not present

## 2015-10-12 DIAGNOSIS — I872 Venous insufficiency (chronic) (peripheral): Secondary | ICD-10-CM | POA: Diagnosis not present

## 2015-10-27 ENCOUNTER — Other Ambulatory Visit: Payer: Self-pay | Admitting: Family Medicine

## 2015-10-27 DIAGNOSIS — F419 Anxiety disorder, unspecified: Secondary | ICD-10-CM

## 2015-11-07 ENCOUNTER — Other Ambulatory Visit: Payer: Self-pay | Admitting: Physician Assistant

## 2015-11-07 DIAGNOSIS — K5900 Constipation, unspecified: Secondary | ICD-10-CM

## 2015-11-15 ENCOUNTER — Other Ambulatory Visit: Payer: Self-pay | Admitting: Internal Medicine

## 2015-11-22 ENCOUNTER — Encounter: Payer: Self-pay | Admitting: Internal Medicine

## 2015-11-22 ENCOUNTER — Ambulatory Visit (INDEPENDENT_AMBULATORY_CARE_PROVIDER_SITE_OTHER): Payer: Medicare Other | Admitting: Internal Medicine

## 2015-11-22 VITALS — BP 140/66 | HR 94 | Ht 71.0 in | Wt 183.0 lb

## 2015-11-22 DIAGNOSIS — J449 Chronic obstructive pulmonary disease, unspecified: Secondary | ICD-10-CM | POA: Diagnosis not present

## 2015-11-22 NOTE — Patient Instructions (Signed)
1.Stop Incruse 2.continue BREO 3.STOP SMOKING ASAP 4.follow up in 6 months after repeat CT chest  Chronic Obstructive Pulmonary Disease Chronic obstructive pulmonary disease (COPD) is a common lung condition in which airflow from the lungs is limited. COPD is a general term that can be used to describe many different lung problems that limit airflow, including both chronic bronchitis and emphysema. If you have COPD, your lung function will probably never return to normal, but there are measures you can take to improve lung function and make yourself feel better. CAUSES   Smoking (common).  Exposure to secondhand smoke.  Genetic problems.  Chronic inflammatory lung diseases or recurrent infections. SYMPTOMS  Shortness of breath, especially with physical activity.  Deep, persistent (chronic) cough with a large amount of thick mucus.  Wheezing.  Rapid breaths (tachypnea).  Gray or bluish discoloration (cyanosis) of the skin, especially in your fingers, toes, or lips.  Fatigue.  Weight loss.  Frequent infections or episodes when breathing symptoms become much worse (exacerbations).  Chest tightness. DIAGNOSIS Your health care provider will take a medical history and perform a physical examination to diagnose COPD. Additional tests for COPD may include:  Lung (pulmonary) function tests.  Chest X-ray.  CT scan.  Blood tests. TREATMENT  Treatment for COPD may include:  Inhaler and nebulizer medicines. These help manage the symptoms of COPD and make your breathing more comfortable.  Supplemental oxygen. Supplemental oxygen is only helpful if you have a low oxygen level in your blood.  Exercise and physical activity. These are beneficial for nearly all people with COPD.  Lung surgery or transplant.  Nutrition therapy to gain weight, if you are underweight.  Pulmonary rehabilitation. This may involve working with a team of health care providers and specialists, such as  respiratory, occupational, and physical therapists. HOME CARE INSTRUCTIONS  Take all medicines (inhaled or pills) as directed by your health care provider.  Avoid over-the-counter medicines or cough syrups that dry up your airway (such as antihistamines) and slow down the elimination of secretions unless instructed otherwise by your health care provider.  If you are a smoker, the most important thing that you can do is stop smoking. Continuing to smoke will cause further lung damage and breathing trouble. Ask your health care provider for help with quitting smoking. He or she can direct you to community resources or hospitals that provide support.  Avoid exposure to irritants such as smoke, chemicals, and fumes that aggravate your breathing.  Use oxygen therapy and pulmonary rehabilitation if directed by your health care provider. If you require home oxygen therapy, ask your health care provider whether you should purchase a pulse oximeter to measure your oxygen level at home.  Avoid contact with individuals who have a contagious illness.  Avoid extreme temperature and humidity changes.  Eat healthy foods. Eating smaller, more frequent meals and resting before meals may help you maintain your strength.  Stay active, but balance activity with periods of rest. Exercise and physical activity will help you maintain your ability to do things you want to do.  Preventing infection and hospitalization is very important when you have COPD. Make sure to receive all the vaccines your health care provider recommends, especially the pneumococcal and influenza vaccines. Ask your health care provider whether you need a pneumonia vaccine.  Learn and use relaxation techniques to manage stress.  Learn and use controlled breathing techniques as directed by your health care provider. Controlled breathing techniques include:  Pursed lip breathing. Start by  breathing in (inhaling) through your nose for 1  second. Then, purse your lips as if you were going to whistle and breathe out (exhale) through the pursed lips for 2 seconds.  Diaphragmatic breathing. Start by putting one hand on your abdomen just above your waist. Inhale slowly through your nose. The hand on your abdomen should move out. Then purse your lips and exhale slowly. You should be able to feel the hand on your abdomen moving in as you exhale.  Learn and use controlled coughing to clear mucus from your lungs. Controlled coughing is a series of short, progressive coughs. The steps of controlled coughing are: 1. Lean your head slightly forward. 2. Breathe in deeply using diaphragmatic breathing. 3. Try to hold your breath for 3 seconds. 4. Keep your mouth slightly open while coughing twice. 5. Spit any mucus out into a tissue. 6. Rest and repeat the steps once or twice as needed. SEEK MEDICAL CARE IF:  You are coughing up more mucus than usual.  There is a change in the color or thickness of your mucus.  Your breathing is more labored than usual.  Your breathing is faster than usual. SEEK IMMEDIATE MEDICAL CARE IF:  You have shortness of breath while you are resting.  You have shortness of breath that prevents you from:  Being able to talk.  Performing your usual physical activities.  You have chest pain lasting longer than 5 minutes.  Your skin color is more cyanotic than usual.  You measure low oxygen saturations for longer than 5 minutes with a pulse oximeter. MAKE SURE YOU:  Understand these instructions.  Will watch your condition.  Will get help right away if you are not doing well or get worse.   This information is not intended to replace advice given to you by your health care provider. Make sure you discuss any questions you have with your health care provider.   Document Released: 01/30/2005 Document Revised: 05/13/2014 Document Reviewed: 12/17/2012 Elsevier Interactive Patient Education [employer]

## 2015-11-22 NOTE — Progress Notes (Signed)
   Subjective:    Patient ID: Cathy Watts, female    DOB: 1945-05-07, 70 y.o.   MRN: [id number]  Synopsis: Gold Grade B with moderate airflow obstruction 2015, still smoking as of 05/2015 Recent DX of SQ cell Lung cancer  CC: no new complaints  HPI  Patient with chronic  SOB with exertion,intermittent  cough and chest congestion Still smokes cigarettes she wears oxygen at night 2 L [place] No signs of infection at this time  Repeat Ct cehst in 10/2015 shows no mets disease,  No residual or recurrent right lower lobe tumor. Evolving mild postradiation change in the central right lower lobe.  Patient still smokes  Review of Systems  Constitutional: Negative for fever, chills and fatigue.  HENT: Negative for congestion.   Respiratory: Positive for cough. Negative for choking, chest tightness, shortness of breath and wheezing.   Cardiovascular: Negative for chest pain and leg swelling.  Gastrointestinal: Negative.   All other systems reviewed and are negative.      Objective:   Physical Exam  Constitutional: She is oriented to person, place, and time. She appears well-developed and well-nourished. No distress.  HENT:  Head: Normocephalic and atraumatic.  Eyes: EOM are normal. Pupils are equal, round, and reactive to light.  Neck: Normal range of motion.  Cardiovascular: Normal rate, regular rhythm and normal heart sounds.   Pulmonary/Chest: Effort normal. No stridor. No respiratory distress. She has no wheezes. She has no rales.  Abdominal: Soft.  Neurological: She is alert and oriented to person, place, and time.  Skin: She is not diaphoretic.  BP 140/66 mmHg  Pulse 94  Ht '5\' 11"'$  (1.803 m)  Wt 183 lb (83.008 kg)  BMI 25.53 kg/m2  SpO2 97%         Assessment & Plan:    70 yo white female SEP 2016- SQUAMOUS CELL LUNG CA STAGE IA with COPD  Gold Stage A   1.continue Breo, stop incruse and assess resp status 2.continue oxygen therapy at night with 2 L [place], albuterol  as needed 3.smoking cessation strongly advised  Follow up in 6 months after she sees hem onc/rad onc adn CT chest completed  The Patient requires high complexity decision making for assessment and support, frequent evaluation and titration of therapies. Patient satisfied with Plan of action and management. All questions answered  Corrin Parker, M.D.  [facility]  Medical Director [facility] Cardio-Pulmonary Department

## 2015-12-21 ENCOUNTER — Other Ambulatory Visit: Payer: Self-pay | Admitting: Internal Medicine

## 2016-01-17 ENCOUNTER — Other Ambulatory Visit: Payer: Self-pay | Admitting: Family Medicine

## 2016-01-17 DIAGNOSIS — E7800 Pure hypercholesterolemia, unspecified: Secondary | ICD-10-CM

## 2016-02-13 ENCOUNTER — Other Ambulatory Visit: Payer: Self-pay | Admitting: Internal Medicine

## 2016-02-19 ENCOUNTER — Telehealth: Payer: Self-pay | Admitting: Internal Medicine

## 2016-02-19 ENCOUNTER — Ambulatory Visit (INDEPENDENT_AMBULATORY_CARE_PROVIDER_SITE_OTHER): Payer: Medicare Other | Admitting: Vascular Surgery

## 2016-02-19 MED ORDER — PREDNISONE 20 MG PO TABS: 40.0000 mg | ORAL_TABLET | Freq: Every day | ORAL | 0 refills | Status: AC

## 2016-02-19 NOTE — Telephone Encounter (Signed)
Prednisone 40 mg daily X 5 days - Rx sent to pharmacy  Needs F/U with DK next week  Merton Border, MD PCCM service Mobile [phone] Pager [phone] 02/19/2016

## 2016-02-19 NOTE — Telephone Encounter (Signed)
Pt states she is congested, SOB, no energy, denies fever, prod cough w/clear mucus at times. X few days and states it is getting worse.

## 2016-02-19 NOTE — Telephone Encounter (Signed)
Pt states she has been congested for over a week now. Please call.

## 2016-02-19 NOTE — Telephone Encounter (Signed)
Pt aware of DS commendations. Pt voiced understanding and had no further questions. Nothing further needed.

## 2016-02-23 ENCOUNTER — Encounter: Payer: Self-pay | Admitting: *Deleted

## 2016-02-26 ENCOUNTER — Ambulatory Visit: Payer: Medicare Other | Admitting: Anesthesiology

## 2016-02-26 ENCOUNTER — Ambulatory Visit
Admission: RE | Admit: 2016-02-26 | Discharge: 2016-02-26 | Disposition: A | Discharge status: Home / Self Care | Payer: Medicare Other | Source: Ambulatory Visit | Attending: Gastroenterology | Admitting: Gastroenterology

## 2016-02-26 ENCOUNTER — Encounter: Payer: Self-pay | Admitting: *Deleted

## 2016-02-26 ENCOUNTER — Encounter
Admission: RE | Disposition: A | Discharge status: Home / Self Care | Payer: Self-pay | Source: Ambulatory Visit | Attending: Gastroenterology

## 2016-02-26 DIAGNOSIS — F17200 Nicotine dependence, unspecified, uncomplicated: Secondary | ICD-10-CM | POA: Insufficient documentation

## 2016-02-26 DIAGNOSIS — F329 Major depressive disorder, single episode, unspecified: Secondary | ICD-10-CM | POA: Insufficient documentation

## 2016-02-26 DIAGNOSIS — Z7982 Long term (current) use of aspirin: Secondary | ICD-10-CM | POA: Insufficient documentation

## 2016-02-26 DIAGNOSIS — M8580 Other specified disorders of bone density and structure, unspecified site: Secondary | ICD-10-CM | POA: Diagnosis not present

## 2016-02-26 DIAGNOSIS — K2270 Barrett's esophagus without dysplasia: Secondary | ICD-10-CM | POA: Insufficient documentation

## 2016-02-26 DIAGNOSIS — J449 Chronic obstructive pulmonary disease, unspecified: Secondary | ICD-10-CM | POA: Diagnosis not present

## 2016-02-26 DIAGNOSIS — Z85118 Personal history of other malignant neoplasm of bronchus and lung: Secondary | ICD-10-CM | POA: Diagnosis not present

## 2016-02-26 DIAGNOSIS — Z8711 Personal history of peptic ulcer disease: Secondary | ICD-10-CM | POA: Diagnosis not present

## 2016-02-26 DIAGNOSIS — Z9981 Dependence on supplemental oxygen: Secondary | ICD-10-CM | POA: Insufficient documentation

## 2016-02-26 DIAGNOSIS — Z79899 Other long term (current) drug therapy: Secondary | ICD-10-CM | POA: Diagnosis not present

## 2016-02-26 DIAGNOSIS — G4730 Sleep apnea, unspecified: Secondary | ICD-10-CM | POA: Diagnosis not present

## 2016-02-26 DIAGNOSIS — F419 Anxiety disorder, unspecified: Secondary | ICD-10-CM | POA: Diagnosis not present

## 2016-02-26 DIAGNOSIS — K2950 Unspecified chronic gastritis without bleeding: Secondary | ICD-10-CM | POA: Insufficient documentation

## 2016-02-26 DIAGNOSIS — K210 Gastro-esophageal reflux disease with esophagitis: Secondary | ICD-10-CM | POA: Insufficient documentation

## 2016-02-26 HISTORY — PX: ESOPHAGOGASTRODUODENOSCOPY (EGD) WITH PROPOFOL: SHX5813

## 2016-02-26 SURGERY — ESOPHAGOGASTRODUODENOSCOPY (EGD) WITH PROPOFOL
Anesthesia: General

## 2016-02-26 MED ORDER — PROPOFOL 500 MG/50ML IV EMUL
INTRAVENOUS | Status: DC | PRN
  Administered 2016-02-26: 150 ug/kg/min via INTRAVENOUS

## 2016-02-26 MED ORDER — SODIUM CHLORIDE 0.9 % IV SOLN: INTRAVENOUS | Status: DC

## 2016-02-26 MED ORDER — SODIUM CHLORIDE 0.9 % IV SOLN
INTRAVENOUS | Status: DC
  Administered 2016-02-26 (×3): via INTRAVENOUS

## 2016-02-26 MED ORDER — PROPOFOL 10 MG/ML IV BOLUS
INTRAVENOUS | Status: DC | PRN
  Administered 2016-02-26: 50 mg via INTRAVENOUS

## 2016-02-26 MED ORDER — MIDAZOLAM HCL 2 MG/2ML IJ SOLN
INTRAMUSCULAR | Status: DC | PRN
Start: 2016-02-26 — End: 2016-02-26
  Administered 2016-02-26: 2 mg via INTRAVENOUS

## 2016-02-26 MED ORDER — LIDOCAINE HCL (CARDIAC) 20 MG/ML IV SOLN
INTRAVENOUS | Status: DC | PRN
  Administered 2016-02-26: 60 mg via INTRAVENOUS

## 2016-02-26 MED ORDER — GLYCOPYRROLATE 0.2 MG/ML IJ SOLN
INTRAMUSCULAR | Status: DC | PRN
  Administered 2016-02-26: .2 mg via INTRAVENOUS

## 2016-02-26 MED ORDER — PHENYLEPHRINE HCL 10 MG/ML IJ SOLN
INTRAMUSCULAR | Status: DC | PRN
  Administered 2016-02-26 (×2): 100 ug via INTRAVENOUS

## 2016-02-26 NOTE — H&P (Signed)
Outpatient short stay form Pre-procedure 02/26/2016 2:18 PM Lollie Sails MD  Primary Physician: Dr. Margarita Rana  Reason for visit:  EGD  History of present illness:  Patient is a 70 year old female presenting today as above. He has a personal history of Barrett's esophagus without evidence of dysplasia with her last procedure being done 10/12/2013. She states her reflux is well controlled. She has no dysphagia. He is continuing take omeprazole 20 mg a day. She does take a 81 mg aspirin however is held that for several days and takes no other aspirin or blood thinning agents.   Current Facility-Administered Medications:  .  0.9 %  sodium chloride infusion, , Intravenous, Continuous, Lollie Sails, MD .  0.9 %  sodium chloride infusion, , Intravenous, Continuous, Lollie Sails, MD, Last Rate: 10 mL/hr at 02/26/16 1309 .  0.9 %  sodium chloride infusion, , Intravenous, Continuous, Lollie Sails, MD  Prescriptions Prior to Admission  Medication Sig Dispense Refill Last Dose  . aspirin EC 81 MG tablet Take 81 mg by mouth every other day.   02/21/2016  . BREO ELLIPTA 100-25 MCG/INH AEPB INHALE ONE PUFF BY MOUTH ONCE DAILY 60 each 0 02/26/2016 at 0600  . busPIRone (BUSPAR) 30 MG tablet TAKE ONE TABLET BY MOUTH TWICE DAILY 60 tablet 11 02/26/2016 at 0600  . CALCIUM CARBONATE-VIT D-MIN PO Take 1 tablet by mouth 3 (three) times daily.   02/25/2016 at Unknown time  . escitalopram (LEXAPRO) 20 MG tablet TAKE ONE TABLET BY MOUTH ONCE DAILY 90 tablet 1 02/26/2016 at 0600  . INCRUSE ELLIPTA 62.5 MCG/INH AEPB INHALE ONE PUFF IN NEBULIZER ONCE DAILY 30 each 0 02/26/2016 at 0600  . Omega-3 Fatty Acids (FISH OIL) 1200 MG CAPS Take 1 capsule by mouth daily.   02/25/2016 at Unknown time  . omeprazole (PRILOSEC) 20 MG capsule TAKE ONE CAPSULE BY MOUTH ONCE DAILY 90 capsule 3 02/26/2016 at 0600  . pravastatin (PRAVACHOL) 20 MG tablet TAKE ONE TABLET BY MOUTH AT BEDTIME 90 tablet 1 02/25/2016  at 2100  . Pseudoephedrine-Guaifenesin (MUCINEX D) [phone] MG TB12 Take 1 tablet by mouth 2 (two) times daily.    02/26/2016 at 0600  . albuterol (PROVENTIL HFA;VENTOLIN HFA) 108 (90 Base) MCG/ACT inhaler Inhale 2 puffs into the lungs every 4 (four) hours as needed for wheezing or shortness of breath (or 30 minutes before exercize). 1 Inhaler 10 02/24/2016  . clotrimazole-betamethasone (LOTRISONE) cream LOTRISONE, 1-0.05% (External Cream)  1 (one) Cream Cream Apply twice daily to affected area.  Not for Internal use.  (Correction from prev script said not for external use and should read , not for internal use) for 0 days  Quantity: 45;  Refills: 1   Ordered :22-Apr-2013  Renaldo Fiddler ;  Started 22-Apr-2013 Active   Taking  . Influenza Vac Split High-Dose 0.5 ML SUSY Inject 0.5 mLs into the muscle once. 0.5 mL 0 Taking  . polyethylene glycol powder (GLYCOLAX/MIRALAX) powder TAKE 17 GRAMS OF POWDER MIXED IN 8 OUNCES OF WATER BY MOUTH ONCE A DAY. 255 g 3 Taking     Allergies  Allergen Reactions  . Amoxicillin Hives and Itching     Past Medical History:  Diagnosis Date  . Anxiety   . Asthma   . Barrett's esophagus   . COPD (chronic obstructive pulmonary disease) ([place])   . Depression   . GERD (gastroesophageal reflux disease)   . History of peptic ulcer disease   . Low oxygen saturation  2l hs  . Osteopenia   . Panic disorder   . Personal history of tobacco use, presenting hazards to health 01/16/2015  . Sleep apnea   . SOB (shortness of breath)   . Squamous cell carcinoma of right lung ([facility])     Review of systems:      Physical Exam    Heart and lungs: Regular rate and rhythm without rub or gallop, lungs are bilaterally with coarse rhonchi and wheezing.    HEENT: Normocephalic atraumatic eyes are anicteric    Other:     Pertinant exam for procedure: Soft nontender nondistended bowel sounds positive normoactive.    Planned proceedures: EGD and indicated  procedures. I have discussed the risks benefits and complications of procedures to include not limited to bleeding, infection, perforation and the risk of sedation and the patient wishes to proceed.    Lollie Sails, MD Gastroenterology 02/26/2016  2:18 PM

## 2016-02-26 NOTE — Anesthesia Postprocedure Evaluation (Signed)
Anesthesia Post Note  Patient: Cathy Watts  Procedure(s) Performed: Procedure(s) (LRB): ESOPHAGOGASTRODUODENOSCOPY (EGD) WITH PROPOFOL (N/A)  Patient location during evaluation: PACU Anesthesia Type: General Level of consciousness: awake and alert and oriented Pain management: pain level controlled Vital Signs Assessment: post-procedure vital signs reviewed and stable Respiratory status: spontaneous breathing, nonlabored ventilation and respiratory function stable Cardiovascular status: blood pressure returned to baseline and stable Postop Assessment: no signs of nausea or vomiting Anesthetic complications: no    Last Vitals:  Vitals:   02/26/16 1457 02/26/16 1507  BP: 102/83 104/85  Pulse: (!) 52 94  Resp: (!) 27 15  Temp: (!) 35.8 C     Last Pain:  Vitals:   02/26/16 1457  TempSrc: Tympanic                 Arora Coakley

## 2016-02-26 NOTE — Transfer of Care (Signed)
Immediate Anesthesia Transfer of Care Note  Patient: Cathy Watts  Procedure(s) Performed: Procedure(s) with comments: ESOPHAGOGASTRODUODENOSCOPY (EGD) WITH PROPOFOL (N/A) - COPD, Sleep Apnea  Patient Location: Endoscopy Unit  Anesthesia Type:General  Level of Consciousness: awake, alert , oriented and patient cooperative  Airway & Oxygen Therapy: Patient Spontanous Breathing and Patient connected to nasal cannula oxygen  Post-op Assessment: Report given to RN, Post -op Vital signs reviewed and stable and Patient moving all extremities X 4  Post vital signs: Reviewed and stable  Last Vitals:  Vitals:   02/26/16 1249  BP: 99/70  Pulse: 95  Resp: 20  Temp: (!) 36.1 C    Last Pain:  Vitals:   02/26/16 1249  TempSrc: Tympanic         Complications: No apparent anesthesia complications

## 2016-02-26 NOTE — Anesthesia Preprocedure Evaluation (Signed)
Anesthesia Evaluation  Patient identified by MRN, date of birth, ID band Patient awake    Reviewed: Allergy & Precautions, NPO status , Patient's Chart, lab work & pertinent test results  History of Anesthesia Complications Negative for: history of anesthetic complications  Airway Mallampati: II  TM Distance: >3 FB Neck ROM: Full    Dental no notable dental hx.    Pulmonary shortness of breath, sleep apnea and Oxygen sleep apnea , COPD,  COPD inhaler and oxygen dependent, Current Smoker,    breath sounds clear to auscultation- rhonchi (-) wheezing      Cardiovascular Exercise Tolerance: Good (-) CAD, (-) Past MI and (-) Cardiac Stents  Rhythm:Regular Rate:Normal     Neuro/Psych Anxiety Depression    GI/Hepatic Neg liver ROS, GERD  ,  Endo/Other  negative endocrine ROSneg diabetes  Renal/GU negative Renal ROS     Musculoskeletal   Abdominal (+) - obese,   Peds  Hematology negative hematology ROS (+)   Anesthesia Other Findings Past Medical History: No date: Anxiety No date: Asthma No date: Barrett's esophagus No date: COPD (chronic obstructive pulmonary disease) (* No date: Depression No date: GERD (gastroesophageal reflux disease) No date: History of peptic ulcer disease No date: Low oxygen saturation     Comment: 2l hs No date: Osteopenia No date: Panic disorder 01/16/2015: Personal history of tobacco use, presenting ha* No date: Sleep apnea No date: SOB (shortness of breath) No date: Squamous cell carcinoma of right lung (HCC)   Reproductive/Obstetrics                             Anesthesia Physical Anesthesia Plan  ASA: III  Anesthesia Plan: General   Post-op Pain Management:    Induction: Intravenous  Airway Management Planned: Natural Airway  Additional Equipment:   Intra-op Plan:   Post-operative Plan:   Informed Consent: I have reviewed the patients History  and Physical, chart, labs and discussed the procedure including the risks, benefits and alternatives for the proposed anesthesia with the patient or authorized representative who has indicated his/her understanding and acceptance.   Dental advisory given  Plan Discussed with: CRNA and Anesthesiologist  Anesthesia Plan Comments:         Anesthesia Quick Evaluation

## 2016-02-26 NOTE — Op Note (Signed)
[facility] Gastroenterology Patient Name: Cathy Watts Procedure Date: 02/26/2016 2:20 PM MRN: [id number] Account #: [id number] Date of Birth: 07-09-1945 Admit Type: Outpatient Age: 70 Room: [facility] ENDO ROOM 3 Gender: Female Note Status: Finalized Procedure:            Upper GI endoscopy Indications:          Follow-up of Barrett's esophagus Providers:            Lollie Sails, MD Referring MD:         Jerrell Belfast, MD (Referring MD) Medicines:            Monitored Anesthesia Care Complications:        No immediate complications. Procedure:            Pre-Anesthesia Assessment:                       - ASA Grade Assessment: III - A patient with severe                        systemic disease.                       After obtaining informed consent, the endoscope was                        passed under direct vision. Throughout the procedure,                        the patient's blood pressure, pulse, and oxygen                        saturations were monitored continuously. The Endoscope                        was introduced through the mouth, and advanced to the                        third part of duodenum. The upper GI endoscopy was                        accomplished without difficulty. The patient tolerated                        the procedure well. Findings:      There were esophageal mucosal changes secondary to established       short-segment Barrett's disease present at the gastroesophageal       junction. The maximum longitudinal extent of these mucosal changes was 1       cm in length. Mucosa was biopsied with a cold forceps for histology. One       specimen bottle was sent to pathology. Mucosa was biopsied with a cold       forceps for histology in 4 quadrants at 42 cm from the incisors. One       specimen bottle was sent to pathology.      Patchy mild inflammation characterized by congestion (edema) and       erythema was found in the  gastric antrum. Biopsies were taken with a       cold forceps for histology. Biopsies were taken with a cold forceps for  Helicobacter pylori testing.      Patchy candidiasis was found in the upper third of the esophagus and in       the middle third of the esophagus. Cells for cytology were obtained by       brushing.      The exam of the esophagus was otherwise normal.      The examined duodenum was normal. Impression:           - Esophageal mucosal changes secondary to established                        short-segment Barrett's disease. Biopsied.                       - Gastritis. Biopsied.                       - Monilial esophagitis. Cells for cytology obtained.                       - Normal examined duodenum. Recommendation:       - Await pathology results.                       - Continue present medications.                       - Telephone GI clinic for pathology results in 1 week.                       - Soft diet for 2 days, then advance as tolerated to                        advance diet as tolerated. Procedure Code(s):    --- Professional ---                       [phone], Esophagogastroduodenoscopy, flexible, transoral;                        with biopsy, single or multiple Diagnosis Code(s):    --- Professional ---                       K22.70, Barrett's esophagus without dysplasia                       K29.70, Gastritis, unspecified, without bleeding                       B37.81, Candidal esophagitis CPT copyright 2016 American Medical Association. All rights reserved. The codes documented in this report are preliminary and upon coder review may  be revised to meet current compliance requirements. Lollie Sails, MD 02/26/2016 2:55:10 PM This report has been signed electronically. Number of Addenda: 0 Note Initiated On: 02/26/2016 2:20 PM      [facility]

## 2016-02-27 ENCOUNTER — Encounter: Payer: Self-pay | Admitting: Gastroenterology

## 2016-02-27 LAB — CYTOLOGY - NON PAP

## 2016-02-28 ENCOUNTER — Ambulatory Visit: Payer: Medicare Other | Admitting: Internal Medicine

## 2016-02-28 LAB — SURGICAL PATHOLOGY

## 2016-02-29 ENCOUNTER — Ambulatory Visit (INDEPENDENT_AMBULATORY_CARE_PROVIDER_SITE_OTHER): Payer: Medicare Other | Admitting: Internal Medicine

## 2016-02-29 ENCOUNTER — Encounter: Payer: Self-pay | Admitting: *Deleted

## 2016-02-29 ENCOUNTER — Encounter: Payer: Self-pay | Admitting: Internal Medicine

## 2016-02-29 VITALS — BP 110/62 | HR 98 | Wt 188.0 lb

## 2016-02-29 DIAGNOSIS — J441 Chronic obstructive pulmonary disease with (acute) exacerbation: Secondary | ICD-10-CM

## 2016-02-29 MED ORDER — UMECLIDINIUM BROMIDE 62.5 MCG/INH IN AEPB: 1.0000 | INHALATION_SPRAY | Freq: Every day | RESPIRATORY_TRACT | 11 refills | Status: DC

## 2016-02-29 MED ORDER — PREDNISONE 20 MG PO TABS: 40.0000 mg | ORAL_TABLET | Freq: Every day | ORAL | 0 refills | Status: DC

## 2016-02-29 MED ORDER — FLUTICASONE FUROATE-VILANTEROL 100-25 MCG/INH IN AEPB: INHALATION_SPRAY | RESPIRATORY_TRACT | 11 refills | Status: DC

## 2016-02-29 NOTE — Patient Instructions (Signed)
Prednisone 40 mg daily for 3 days Continue BREO/Incruse Chronic Obstructive Pulmonary Disease Chronic obstructive pulmonary disease (COPD) is a common lung condition in which airflow from the lungs is limited. COPD is a general term that can be used to describe many different lung problems that limit airflow, including both chronic bronchitis and emphysema. If you have COPD, your lung function will probably never return to normal, but there are measures you can take to improve lung function and make yourself feel better. CAUSES   Smoking (common).  Exposure to secondhand smoke.  Genetic problems.  Chronic inflammatory lung diseases or recurrent infections. SYMPTOMS  Shortness of breath, especially with physical activity.  Deep, persistent (chronic) cough with a large amount of thick mucus.  Wheezing.  Rapid breaths (tachypnea).  Gray or bluish discoloration (cyanosis) of the skin, especially in your fingers, toes, or lips.  Fatigue.  Weight loss.  Frequent infections or episodes when breathing symptoms become much worse (exacerbations).  Chest tightness. DIAGNOSIS Your health care provider will take a medical history and perform a physical examination to diagnose COPD. Additional tests for COPD may include:  Lung (pulmonary) function tests.  Chest X-ray.  CT scan.  Blood tests. TREATMENT  Treatment for COPD may include:  Inhaler and nebulizer medicines. These help manage the symptoms of COPD and make your breathing more comfortable.  Supplemental oxygen. Supplemental oxygen is only helpful if you have a low oxygen level in your blood.  Exercise and physical activity. These are beneficial for nearly all people with COPD.  Lung surgery or transplant.  Nutrition therapy to gain weight, if you are underweight.  Pulmonary rehabilitation. This may involve working with a team of health care providers and specialists, such as respiratory, occupational, and physical  therapists. HOME CARE INSTRUCTIONS  Take all medicines (inhaled or pills) as directed by your health care provider.  Avoid over-the-counter medicines or cough syrups that dry up your airway (such as antihistamines) and slow down the elimination of secretions unless instructed otherwise by your health care provider.  If you are a smoker, the most important thing that you can do is stop smoking. Continuing to smoke will cause further lung damage and breathing trouble. Ask your health care provider for help with quitting smoking. He or she can direct you to community resources or hospitals that provide support.  Avoid exposure to irritants such as smoke, chemicals, and fumes that aggravate your breathing.  Use oxygen therapy and pulmonary rehabilitation if directed by your health care provider. If you require home oxygen therapy, ask your health care provider whether you should purchase a pulse oximeter to measure your oxygen level at home.  Avoid contact with individuals who have a contagious illness.  Avoid extreme temperature and humidity changes.  Eat healthy foods. Eating smaller, more frequent meals and resting before meals may help you maintain your strength.  Stay active, but balance activity with periods of rest. Exercise and physical activity will help you maintain your ability to do things you want to do.  Preventing infection and hospitalization is very important when you have COPD. Make sure to receive all the vaccines your health care provider recommends, especially the pneumococcal and influenza vaccines. Ask your health care provider whether you need a pneumonia vaccine.  Learn and use relaxation techniques to manage stress.  Learn and use controlled breathing techniques as directed by your health care provider. Controlled breathing techniques include:  Pursed lip breathing. Start by breathing in (inhaling) through your nose for 1  second. Then, purse your lips as if you were  going to whistle and breathe out (exhale) through the pursed lips for 2 seconds.  Diaphragmatic breathing. Start by putting one hand on your abdomen just above your waist. Inhale slowly through your nose. The hand on your abdomen should move out. Then purse your lips and exhale slowly. You should be able to feel the hand on your abdomen moving in as you exhale.  Learn and use controlled coughing to clear mucus from your lungs. Controlled coughing is a series of short, progressive coughs. The steps of controlled coughing are: 1. Lean your head slightly forward. 2. Breathe in deeply using diaphragmatic breathing. 3. Try to hold your breath for 3 seconds. 4. Keep your mouth slightly open while coughing twice. 5. Spit any mucus out into a tissue. 6. Rest and repeat the steps once or twice as needed. SEEK MEDICAL CARE IF:  You are coughing up more mucus than usual.  There is a change in the color or thickness of your mucus.  Your breathing is more labored than usual.  Your breathing is faster than usual. SEEK IMMEDIATE MEDICAL CARE IF:  You have shortness of breath while you are resting.  You have shortness of breath that prevents you from:  Being able to talk.  Performing your usual physical activities.  You have chest pain lasting longer than 5 minutes.  Your skin color is more cyanotic than usual.  You measure low oxygen saturations for longer than 5 minutes with a pulse oximeter. MAKE SURE YOU:  Understand these instructions.  Will watch your condition.  Will get help right away if you are not doing well or get worse.   This information is not intended to replace advice given to you by your health care provider. Make sure you discuss any questions you have with your health care provider.   Document Released: 01/30/2005 Document Revised: 05/13/2014 Document Reviewed: 12/17/2012 Elsevier Interactive Patient Education [employer].

## 2016-02-29 NOTE — Progress Notes (Signed)
   Subjective:    Patient ID: Cathy Watts, female    DOB: 11/01/1945, 70 y.o.   MRN: [id number]  Synopsis: Gold Grade B with moderate airflow obstruction 2015, still smoking as of 05/2015 Recent DX of SQ cell Lung cancer  CC: cough,wheezing HPI  Patient with chronic SOB with exertion,intermittent  cough and chest congestion Still smokes cigarettes she wears oxygen at night 2 L [place] Was given prednisone last week, feels better, still with cough and congestion  Repeat CT chest in 10/2015 shows no mets disease,  No residual or recurrent right lower lobe tumor. Evolving mild postradiation change in the central right lower lobe.  Patient still smokes  Review of Systems  Constitutional: Negative for chills, fatigue and fever.  HENT: Negative for congestion.   Respiratory: Positive for cough. Negative for choking, chest tightness, shortness of breath and wheezing.   Cardiovascular: Negative for chest pain and leg swelling.  Gastrointestinal: Negative.   All other systems reviewed and are negative.  BP 110/62 (BP Location: Left Arm, Cuff Size: Normal)   Pulse 98   Wt 188 lb (85.3 kg)   SpO2 95%   BMI 26.22 kg/m      Objective:   Physical Exam  Constitutional: She is oriented to person, place, and time. She appears well-developed and well-nourished. No distress.  HENT:  Head: Normocephalic and atraumatic.  Eyes: EOM are normal. Pupils are equal, round, and reactive to light.  Neck: Normal range of motion.  Cardiovascular: Normal rate, regular rhythm and normal heart sounds.   Pulmonary/Chest: Effort normal. No stridor. No respiratory distress. She has no wheezes. She has no rales.  Abdominal: Soft.  Neurological: She is alert and oriented to person, place, and time.  Skin: She is not diaphoretic.         Assessment & Plan:    70 yo white female SEP 2016- SQUAMOUS CELL LUNG CA STAGE IA with COPD  Gold Stage A with chronic hypoxic resp failure   1.continue Breo,  continue incruse 2.continue oxygen therapy at night with 2 L [place], albuterol as needed 3.smoking cessation strongly advised 4.prednisone 40 mg daily for 3 days  Follow up in 3 months with CT chest  The Patient requires high complexity decision making for assessment and support, frequent evaluation and titration of therapies. Patient satisfied with Plan of action and management. All questions answered  Corrin Parker, M.D.  [facility]  Medical Director [place] Director [facility] Cardio-Pulmonary Department

## 2016-03-04 ENCOUNTER — Ambulatory Visit (INDEPENDENT_AMBULATORY_CARE_PROVIDER_SITE_OTHER): Payer: Medicare Other | Admitting: Vascular Surgery

## 2016-03-04 ENCOUNTER — Encounter (INDEPENDENT_AMBULATORY_CARE_PROVIDER_SITE_OTHER): Payer: Self-pay | Admitting: Vascular Surgery

## 2016-03-04 VITALS — BP 136/87 | HR 96 | Resp 17 | Ht 71.0 in | Wt 185.8 lb

## 2016-03-04 DIAGNOSIS — M79605 Pain in left leg: Secondary | ICD-10-CM | POA: Diagnosis not present

## 2016-03-04 DIAGNOSIS — I872 Venous insufficiency (chronic) (peripheral): Secondary | ICD-10-CM | POA: Diagnosis not present

## 2016-03-04 DIAGNOSIS — M79604 Pain in right leg: Secondary | ICD-10-CM

## 2016-03-04 DIAGNOSIS — I83813 Varicose veins of bilateral lower extremities with pain: Secondary | ICD-10-CM

## 2016-03-12 ENCOUNTER — Encounter (INDEPENDENT_AMBULATORY_CARE_PROVIDER_SITE_OTHER): Payer: Self-pay | Admitting: Vascular Surgery

## 2016-03-12 ENCOUNTER — Ambulatory Visit (INDEPENDENT_AMBULATORY_CARE_PROVIDER_SITE_OTHER): Payer: Medicare Other | Admitting: Vascular Surgery

## 2016-03-12 VITALS — BP 120/78 | HR 92 | Resp 17 | Wt 187.0 lb

## 2016-03-12 DIAGNOSIS — I83813 Varicose veins of bilateral lower extremities with pain: Secondary | ICD-10-CM | POA: Insufficient documentation

## 2016-03-12 DIAGNOSIS — I83812 Varicose veins of left lower extremities with pain: Secondary | ICD-10-CM

## 2016-03-12 DIAGNOSIS — I83811 Varicose veins of right lower extremities with pain: Secondary | ICD-10-CM

## 2016-03-12 NOTE — Progress Notes (Signed)
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  Procedure: Sclerotherapy using hypertonic saline mixed with 1% Lidocaine was performed on the bilateral lower extremities. Compression wraps were placed. The patient tolerated the procedure well.  Plan: Follow up as needed.

## 2016-03-20 ENCOUNTER — Ambulatory Visit
Admission: RE | Admit: 2016-03-20 | Discharge: 2016-03-20 | Disposition: A | Discharge status: Home / Self Care | Payer: Medicare Other | Source: Ambulatory Visit | Attending: Radiation Oncology | Admitting: Radiation Oncology

## 2016-03-20 DIAGNOSIS — J219 Acute bronchiolitis, unspecified: Secondary | ICD-10-CM | POA: Insufficient documentation

## 2016-03-20 DIAGNOSIS — C3411 Malignant neoplasm of upper lobe, right bronchus or lung: Secondary | ICD-10-CM | POA: Diagnosis not present

## 2016-03-20 LAB — POCT I-STAT CREATININE: CREATININE: 0.8 mg/dL (ref 0.44–1.00)

## 2016-03-20 MED ORDER — IOPAMIDOL (ISOVUE-300) INJECTION 61%
75.0000 mL | Freq: Once | INTRAVENOUS | Status: AC | PRN
  Administered 2016-03-20: 75 mL via INTRAVENOUS

## 2016-03-26 ENCOUNTER — Ambulatory Visit (INDEPENDENT_AMBULATORY_CARE_PROVIDER_SITE_OTHER): Payer: Medicare Other | Admitting: Vascular Surgery

## 2016-04-08 ENCOUNTER — Ambulatory Visit (INDEPENDENT_AMBULATORY_CARE_PROVIDER_SITE_OTHER): Payer: Medicare Other | Admitting: Vascular Surgery

## 2016-04-08 ENCOUNTER — Encounter (INDEPENDENT_AMBULATORY_CARE_PROVIDER_SITE_OTHER): Payer: Self-pay | Admitting: Vascular Surgery

## 2016-04-08 VITALS — BP 129/77 | HR 95 | Resp 16 | Ht 71.0 in | Wt 182.0 lb

## 2016-04-08 DIAGNOSIS — I83811 Varicose veins of right lower extremities with pain: Secondary | ICD-10-CM | POA: Diagnosis not present

## 2016-04-08 DIAGNOSIS — I83812 Varicose veins of left lower extremities with pain: Secondary | ICD-10-CM

## 2016-04-08 DIAGNOSIS — I83813 Varicose veins of bilateral lower extremities with pain: Secondary | ICD-10-CM

## 2016-04-08 NOTE — Progress Notes (Signed)
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  Procedure: Sclerotherapy using hypertonic saline mixed with 1% Lidocaine was performed on the bilateral lower extremities. Compression wraps were placed. The patient tolerated the procedure well.  Plan: Follow up as needed.

## 2016-04-11 DIAGNOSIS — I872 Venous insufficiency (chronic) (peripheral): Secondary | ICD-10-CM | POA: Insufficient documentation

## 2016-04-11 DIAGNOSIS — M79606 Pain in leg, unspecified: Secondary | ICD-10-CM | POA: Insufficient documentation

## 2016-04-11 NOTE — Progress Notes (Signed)
MRN : [id number]  Cathy Watts is a 70 y.o. (Jul 28, 1945) female who presents with chief complaint of  Chief Complaint  Patient presents with  . Follow-up  .  History of Present Illness: The patient returns to the office for followup status post laser ablation of the right great saphenous vein.  The patient note significant improvement in the lower extremity pain but not resolution of the symptoms. The patient notes multiple residual varicosities bilaterally which continued to hurt with dependent positions and remained tender to palpation. The patient's swelling is minimally from preoperative status. The patient continues to wear graduated compression stockings on a daily basis but these are not eliminating the pain and discomfort. The patient continues to use over-the-counter anti-inflammatory medications to treat the pain and related symptoms but this has not given the patient relief. The patient notes the pain in the lower extremities is causing problems with daily exercise, problems at work and even with household activities such as preparing meals and doing dishes.  The patient is otherwise done well and there have been no complications related to the laser procedure or interval changes in the patient's overall   Post laser ultrasound shows successful ablation of the right proximal great saphenous vein   Current Meds  Medication Sig  . albuterol (PROVENTIL HFA;VENTOLIN HFA) 108 (90 Base) MCG/ACT inhaler Inhale 2 puffs into the lungs every 4 (four) hours as needed for wheezing or shortness of breath (or 30 minutes before exercize).  Marland Kitchen aspirin EC 81 MG tablet Take 81 mg by mouth every other day.  . busPIRone (BUSPAR) 30 MG tablet TAKE ONE TABLET BY MOUTH TWICE DAILY  . CALCIUM CARBONATE-VIT D-MIN PO Take 1 tablet by mouth 3 (three) times daily.  . clotrimazole-betamethasone (LOTRISONE) cream LOTRISONE, 1-0.05% (External Cream)  1 (one) Cream Cream Apply twice daily to affected area.   Not for Internal use.  (Correction from prev script said not for external use and should read , not for internal use) for 0 days  Quantity: 45;  Refills: 1   Ordered :22-Apr-2013  Renaldo Fiddler ;  Started 22-Apr-2013 Active  . escitalopram (LEXAPRO) 20 MG tablet TAKE ONE TABLET BY MOUTH ONCE DAILY  . fluticasone furoate-vilanterol (BREO ELLIPTA) 100-25 MCG/INH AEPB INHALE ONE PUFF BY MOUTH ONCE DAILY  . Influenza Vac Split High-Dose 0.5 ML SUSY Inject 0.5 mLs into the muscle once.  . Omega-3 Fatty Acids (FISH OIL) 1200 MG CAPS Take 1 capsule by mouth daily.  Marland Kitchen omeprazole (PRILOSEC) 20 MG capsule TAKE ONE CAPSULE BY MOUTH ONCE DAILY  . polyethylene glycol powder (GLYCOLAX/MIRALAX) powder TAKE 17 GRAMS OF POWDER MIXED IN 8 OUNCES OF WATER BY MOUTH ONCE A DAY.  . pravastatin (PRAVACHOL) 20 MG tablet TAKE ONE TABLET BY MOUTH AT BEDTIME  . predniSONE (DELTASONE) 20 MG tablet Take 2 tablets (40 mg total) by mouth daily with breakfast.  . Pseudoephedrine-Guaifenesin (MUCINEX D) [phone] MG TB12 Take 1 tablet by mouth 2 (two) times daily.   Marland Kitchen umeclidinium bromide (INCRUSE ELLIPTA) 62.5 MCG/INH AEPB Inhale 1 puff into the lungs daily.    Past Medical History:  Diagnosis Date  . Anxiety   . Asthma   . Barrett's esophagus   . COPD (chronic obstructive pulmonary disease) ([place])   . Depression   . GERD (gastroesophageal reflux disease)   . History of peptic ulcer disease   . Low oxygen saturation    2l hs  . Osteopenia   . Panic disorder   .  Personal history of tobacco use, presenting hazards to health 01/16/2015  . Sleep apnea   . SOB (shortness of breath)   . Squamous cell carcinoma of right lung [facility])     Past Surgical History:  Procedure Laterality Date  . CERVICAL CONE BIOPSY  1990  . CHOLECYSTECTOMY  1980's  . colonoscopy  2014  . ELBOW FRACTURE SURGERY Right 2009  . ENDOBRONCHIAL ULTRASOUND N/A 02/07/2015   Procedure: ENDOBRONCHIAL ULTRASOUND;  Surgeon: Flora Lipps, MD;   Location: [facility] ORS;  Service: Cardiopulmonary;  Laterality: N/A;  . ESOPHAGOGASTRODUODENOSCOPY (EGD) WITH PROPOFOL N/A 02/26/2016   Procedure: ESOPHAGOGASTRODUODENOSCOPY (EGD) WITH PROPOFOL;  Surgeon: Lollie Sails, MD;  Location: [facility] ENDOSCOPY;  Service: Endoscopy;  Laterality: N/A;  COPD, Sleep Apnea  . TONSILLECTOMY  1979  . TUBAL LIGATION  1970's  . UPPER GI ENDOSCOPY      Social History Social History  Substance Use Topics  . Smoking status: Current Every Day Smoker    Packs/day: 1.25    Years: 55.00    Types: Cigarettes  . Smokeless tobacco: Never Used     Comment: used to use E-cig  . Alcohol use No    Family History Family History  Problem Relation Age of Onset  . Colon cancer Mother     colon  . Diabetes Mother   . Arthritis Mother   . Glaucoma Mother   . Stomach cancer Father     stomach  . Throat cancer Brother     throat  . Breast cancer Sister     breast  . Diabetes Sister   . Cirrhosis Sister   . Cancer Paternal Aunt   . Parkinson's disease Sister   . Heart attack Sister   . Ovarian cancer Sister     Allergies  Allergen Reactions  . Amoxicillin Hives and Itching     REVIEW OF SYSTEMS (Negative unless checked)  Constitutional: '[]'$ Weight loss  '[]'$ Fever  '[]'$ Chills Cardiac: '[]'$ Chest pain   '[]'$ Chest pressure   '[]'$ Palpitations   '[]'$ Shortness of breath when laying flat   '[]'$ Shortness of breath with exertion. Vascular:  '[]'$ Pain in legs with walking   '[]'$ Pain in legs at rest  '[]'$ History of DVT   '[]'$ Phlebitis   '[x]'$ Swelling in legs   '[x]'$ Varicose veins   '[]'$ Non-healing ulcers Pulmonary:   '[]'$ Uses home oxygen   '[]'$ Productive cough   '[]'$ Hemoptysis   '[]'$ Wheeze  '[]'$ COPD   '[]'$ Asthma Neurologic:  '[]'$ Dizziness   '[]'$ Seizures   '[]'$ History of stroke   '[]'$ History of TIA  '[]'$ Aphasia   '[]'$ Vissual changes   '[]'$ Weakness or numbness in arm   '[]'$ Weakness or numbness in leg Musculoskeletal:   '[]'$ Joint swelling   '[]'$ Joint pain   '[]'$ Low back pain Hematologic:  '[]'$ Easy bruising  '[]'$ Easy bleeding    '[]'$ Hypercoagulable state   '[]'$ Anemic Gastrointestinal:  '[]'$ Diarrhea   '[]'$ Vomiting  '[]'$ Gastroesophageal reflux/heartburn   '[]'$ Difficulty swallowing. Genitourinary:  '[]'$ Chronic kidney disease   '[]'$ Difficult urination  '[]'$ Frequent urination   '[]'$ Blood in urine Skin:  '[]'$ Rashes   '[]'$ Ulcers  Psychological:  '[]'$ History of anxiety   '[]'$  History of major depression.  Physical Examination  Vitals:   03/04/16 1648  BP: 136/87  Pulse: 96  Resp: 17  Weight: 185 lb 12.8 oz (84.3 kg)  Height: '5\' 11"'$  (1.803 m)   Body mass index is 25.91 kg/m. Gen: WD/WN, NAD Head: [place]/AT, No temporalis wasting.  Ear/Nose/Throat: Hearing grossly intact, nares w/o erythema or drainage, poor dentition Eyes: PER, EOMI, sclera nonicteric.  Neck: Supple, no masses.  No bruit  or JVD.  Pulmonary:  Good air movement, clear to auscultation bilaterally, no use of accessory muscles.  Cardiac: RRR, normal S1, S2, no Murmurs. Vascular: multiple large residual varicose veins bilaterally >10 mm.  Moderate venous stasis dermatitis bilaterally Vessel Right Left  Radial Palpable Palpable  Ulnar Palpable Palpable  Brachial Palpable Palpable  Carotid Palpable Palpable  Femoral Palpable Palpable  Popliteal Palpable Palpable  PT Palpable Palpable  DP Palpable Palpable   Gastrointestinal: soft, non-distended. No guarding/no peritoneal signs.  Musculoskeletal: M/S 5/5 throughout.  No deformity or atrophy.  Neurologic: CN 2-12 intact. Pain and light touch intact in extremities.  Symmetrical.  Speech is fluent. Motor exam as listed above. Psychiatric: Judgment intact, Mood & affect appropriate for pt's clinical situation. Dermatologic: No rashes or ulcers noted.  No changes consistent with cellulitis. Lymph : No Cervical lymphadenopathy, no lichenification or skin changes of chronic lymphedema.  CBC Lab Results  Component Value Date   WBC 6.6 10/09/2015   HGB 14.5 10/09/2015   HCT 42.1 10/09/2015   MCV 92.4 10/09/2015   PLT 248 10/09/2015      BMET    Component Value Date/Time   NA 135 10/09/2015 1445   K 4.0 10/09/2015 1445   CL 101 10/09/2015 1445   CO2 29 10/09/2015 1445   GLUCOSE 96 10/09/2015 1445   BUN 11 10/09/2015 1445   CREATININE 0.80 03/20/2016 0912   CALCIUM 9.3 10/09/2015 1445   GFRNONAA >60 10/09/2015 1445   GFRAA >60 10/09/2015 1445   CrCl cannot be calculated (Patient's most recent lab result is older than the maximum 21 days allowed.).  COAG No results found for: INR, PROTIME  Radiology Ct Chest W Contrast  Result Date: 03/20/2016 CLINICAL DATA:  Restaging lung cancer. Initial diagnosis November 2016 EXAM: CT CHEST WITH CONTRAST TECHNIQUE: Multidetector CT imaging of the chest was performed during intravenous contrast administration. CONTRAST:  79m ISOVUE-300 IOPAMIDOL (ISOVUE-300) INJECTION 61% COMPARISON:  Chest CT 10/05/2015 FINDINGS: Cardiovascular: The heart is normal in size. No pericardial effusion. Stable tortuosity and calcification of the thoracic aorta. Stable scattered coronary artery calcifications. Mediastinum/Nodes: No mediastinal or hilar mass or lymphadenopathy. The esophagus is grossly normal. Lungs/Pleura: No findings for residual or recurrent tumor. Stable radiation changes involving the right lung. Stable emphysematous changes and pulmonary scarring. No worrisome pulmonary nodules to suggest pulmonary metastatic disease. A few tiny scattered subpleural nodules are stable. Right upper lobe pulmonary nodule on image number 45 is unchanged and measures 3.5 mm. Persistent changes of mild respiratory bronchiolitis. Upper Abdomen: No significant upper abdominal findings. Stable mild nodular enlargement of the adrenal glands suggesting nodular hyperplasia. No findings for hepatic metastatic disease. Stable cyst in the left lobe. Musculoskeletal: No significant bony findings. IMPRESSION: 1. No findings for residual or recurrent tumor, adenopathy or pulmonary metastatic disease. 2. Stable  emphysematous changes, respiratory bronchiolitis and scarring. Small scattered airspace nodules are stable. 3. No findings for upper abdominal metastatic disease or osseous metastatic disease. Electronically Signed   By: PMarijo SanesM.D.   On: 03/20/2016 10:40     Assessment/Plan 1. Varicose veins of bilateral lower extremities with pain Recommend:  The patient has had successful ablation of the previously incompetent saphenous venous system but still has persistent symptoms of pain and swelling that are having a negative impact on daily life and daily activities.  Patient should undergo injection sclerotherapy to treat the residual varicosities.  The risks, benefits and alternative therapies were reviewed in detail with the patient.  All questions  were answered.  The patient agrees to proceed with sclerotherapy at their convenience.  The patient will continue wearing the graduated compression stockings and using the over-the-counter pain medications to treat her symptoms.   2. Chronic venous insufficiency See #1  3. Pain in both lower extremities See #1    Hortencia Pilar, MD  04/11/2016 1:33 PM

## 2016-04-22 ENCOUNTER — Encounter: Payer: Self-pay | Admitting: Internal Medicine

## 2016-04-22 ENCOUNTER — Inpatient Hospital Stay: Payer: Medicare Other | Attending: Internal Medicine | Admitting: Internal Medicine

## 2016-04-22 ENCOUNTER — Ambulatory Visit: Payer: Medicare Other | Admitting: Radiation Oncology

## 2016-04-22 VITALS — BP 113/80 | HR 89 | Temp 97.6°F | Resp 18 | Ht 71.0 in | Wt 188.0 lb

## 2016-04-22 DIAGNOSIS — M8580 Other specified disorders of bone density and structure, unspecified site: Secondary | ICD-10-CM | POA: Diagnosis not present

## 2016-04-22 DIAGNOSIS — K2270 Barrett's esophagus without dysplasia: Secondary | ICD-10-CM | POA: Insufficient documentation

## 2016-04-22 DIAGNOSIS — Z800 Family history of malignant neoplasm of digestive organs: Secondary | ICD-10-CM | POA: Insufficient documentation

## 2016-04-22 DIAGNOSIS — C3431 Malignant neoplasm of lower lobe, right bronchus or lung: Secondary | ICD-10-CM | POA: Diagnosis present

## 2016-04-22 DIAGNOSIS — G4730 Sleep apnea, unspecified: Secondary | ICD-10-CM | POA: Insufficient documentation

## 2016-04-22 DIAGNOSIS — Z8711 Personal history of peptic ulcer disease: Secondary | ICD-10-CM | POA: Insufficient documentation

## 2016-04-22 DIAGNOSIS — Z87891 Personal history of nicotine dependence: Secondary | ICD-10-CM | POA: Diagnosis not present

## 2016-04-22 DIAGNOSIS — Z79899 Other long term (current) drug therapy: Secondary | ICD-10-CM | POA: Diagnosis not present

## 2016-04-22 DIAGNOSIS — Z7982 Long term (current) use of aspirin: Secondary | ICD-10-CM | POA: Diagnosis not present

## 2016-04-22 DIAGNOSIS — K219 Gastro-esophageal reflux disease without esophagitis: Secondary | ICD-10-CM | POA: Diagnosis not present

## 2016-04-22 DIAGNOSIS — Z803 Family history of malignant neoplasm of breast: Secondary | ICD-10-CM

## 2016-04-22 DIAGNOSIS — Z8041 Family history of malignant neoplasm of ovary: Secondary | ICD-10-CM | POA: Diagnosis not present

## 2016-04-22 DIAGNOSIS — J440 Chronic obstructive pulmonary disease with acute lower respiratory infection: Secondary | ICD-10-CM | POA: Diagnosis not present

## 2016-04-22 NOTE — Progress Notes (Signed)
[facility] NOTE  Patient Care Team: Mar Daring, PA-C as PCP - General (Family Medicine)  CHIEF COMPLAINTS/PURPOSE OF CONSULTATION:  Lung cancer  Oncology History   # SEP 2016-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 NOV 15th CT NED  # COPD on 02 at night; hx of smoking.      Cancer of lower lobe of right lung ([place])   10/09/2015 Initial Diagnosis    Cancer of lower lobe of right lung ([facility])        HISTORY OF PRESENTING ILLNESS:  Cathy Watts 70 y.o.  female long-standing history of smoking COPD with squamous cell lung cancer stage I currently on definitive radiation is here for follow-up/ review the results of her restaging CAT scan.   In the interim patient has quit smoking. She denies any shortness of breath or chest pain. Denies any unusual cough or hemoptysis.  Admits to chronic shortness of breath. Otherwise denies any headaches or vision changes or any bone pain.   ROS: A complete 10 point review of system is done which is negative except mentioned above in history of present illness  MEDICAL HISTORY:  Past Medical History:  Diagnosis Date  . Anxiety   . Asthma   . Barrett's esophagus   . COPD (chronic obstructive pulmonary disease) ([place])   . Depression   . GERD (gastroesophageal reflux disease)   . History of peptic ulcer disease   . Low oxygen saturation    2l hs  . Osteopenia   . Panic disorder   . Personal history of tobacco use, presenting hazards to health 01/16/2015  . Sleep apnea   . SOB (shortness of breath)   . Squamous cell carcinoma of right lung ([place])     SURGICAL HISTORY: Past Surgical History:  Procedure Laterality Date  . CERVICAL CONE BIOPSY  1990  . CHOLECYSTECTOMY  1980's  . colonoscopy  2014  . ELBOW FRACTURE SURGERY Right 2009  . ENDOBRONCHIAL ULTRASOUND N/A 02/07/2015   Procedure:  ENDOBRONCHIAL ULTRASOUND;  Surgeon: Flora Lipps, MD;  Location: [facility] ORS;  Service: Cardiopulmonary;  Laterality: N/A;  . ESOPHAGOGASTRODUODENOSCOPY (EGD) WITH PROPOFOL N/A 02/26/2016   Procedure: ESOPHAGOGASTRODUODENOSCOPY (EGD) WITH PROPOFOL;  Surgeon: Lollie Sails, MD;  Location: [facility] ENDOSCOPY;  Service: Endoscopy;  Laterality: N/A;  COPD, Sleep Apnea  . TONSILLECTOMY  1979  . TUBAL LIGATION  1970's  . UPPER GI ENDOSCOPY      SOCIAL HISTORY: Social History   Social History  . Marital status: Married    Spouse name: N/A  . Number of children: N/A  . Years of education: N/A   Occupational History  . Not on file.   Social History Main Topics  . Smoking status: Former Smoker    Packs/day: 1.25    Years: 55.00    Types: Cigarettes    Quit date: 04/12/2016  . Smokeless tobacco: Never Used     Comment: used to use E-cig  . Alcohol use No  . Drug use: No  . Sexual activity: Not on file   Other Topics Concern  . Not on file   Social History Narrative  . No narrative on file    FAMILY HISTORY: Family History  Problem Relation Age of Onset  . Colon cancer Mother     colon  . Diabetes Mother   . Arthritis Mother   .  Glaucoma Mother   . Stomach cancer Father     stomach  . Throat cancer Brother     throat  . Breast cancer Sister     breast  . Diabetes Sister   . Cirrhosis Sister   . Cancer Paternal Aunt   . Parkinson's disease Sister   . Heart attack Sister   . Ovarian cancer Sister     ALLERGIES:  is allergic to amoxicillin.  MEDICATIONS:  Current Outpatient Prescriptions  Medication Sig Dispense Refill  . albuterol (PROVENTIL HFA;VENTOLIN HFA) 108 (90 Base) MCG/ACT inhaler Inhale 2 puffs into the lungs every 4 (four) hours as needed for wheezing or shortness of breath (or 30 minutes before exercize). 1 Inhaler 10  . aspirin EC 81 MG tablet Take 81 mg by mouth every other day.    . busPIRone (BUSPAR) 30 MG tablet TAKE ONE TABLET BY MOUTH TWICE DAILY 60  tablet 11  . CALCIUM CARBONATE-VIT D-MIN PO Take 1 tablet by mouth 3 (three) times daily.    . clotrimazole-betamethasone (LOTRISONE) cream LOTRISONE, 1-0.05% (External Cream)  1 (one) Cream Cream Apply twice daily to affected area.  Not for Internal use.  (Correction from prev script said not for external use and should read , not for internal use) for 0 days  Quantity: 45;  Refills: 1   Ordered :22-Apr-2013  Renaldo Fiddler ;  Started 22-Apr-2013 Active    . escitalopram (LEXAPRO) 20 MG tablet TAKE ONE TABLET BY MOUTH ONCE DAILY 90 tablet 1  . fluticasone furoate-vilanterol (BREO ELLIPTA) 100-25 MCG/INH AEPB INHALE ONE PUFF BY MOUTH ONCE DAILY 60 each 11  . Omega-3 Fatty Acids (FISH OIL) 1200 MG CAPS Take 1 capsule by mouth daily.    Marland Kitchen omeprazole (PRILOSEC) 20 MG capsule TAKE ONE CAPSULE BY MOUTH ONCE DAILY 90 capsule 3  . polyethylene glycol powder (GLYCOLAX/MIRALAX) powder TAKE 17 GRAMS OF POWDER MIXED IN 8 OUNCES OF WATER BY MOUTH ONCE A DAY. 255 g 3  . pravastatin (PRAVACHOL) 20 MG tablet TAKE ONE TABLET BY MOUTH AT BEDTIME 90 tablet 1  . Pseudoephedrine-Guaifenesin (MUCINEX D) [phone] MG TB12 Take 1 tablet by mouth 2 (two) times daily.     Marland Kitchen umeclidinium bromide (INCRUSE ELLIPTA) 62.5 MCG/INH AEPB Inhale 1 puff into the lungs daily. 30 each 11   No current facility-administered medications for this visit.       Marland Kitchen  PHYSICAL EXAMINATION: ECOG PERFORMANCE STATUS: 1 - Symptomatic but completely ambulatory  Vitals:   04/22/16 1456  BP: 113/80  Pulse: 89  Resp: 18  Temp: 97.6 F (36.4 C)   Filed Weights   04/22/16 1456  Weight: 188 lb (85.3 kg)    GENERAL: Well-nourished well-developed; Alert, no distress and comfortable.   She is alone.Marland Kitchen EYES: no pallor or icterus OROPHARYNX: no thrush or ulceration; good dentition  NECK: supple, no masses felt LYMPH:  no palpable lymphadenopathy in the cervical, axillary or inguinal regions LUNGS: clear to auscultation and  No  wheeze or crackles HEART/CVS: regular rate & rhythm and no murmurs; No lower extremity edema ABDOMEN: abdomen soft, non-tender and normal bowel sounds Musculoskeletal:no cyanosis of digits and no clubbing  PSYCH: alert & oriented x 3 with fluent speech NEURO: no focal motor/sensory deficits SKIN:  no rashes or significant lesions  LABORATORY DATA:  I have reviewed the data as listed Lab Results  Component Value Date   WBC 6.6 10/09/2015   HGB 14.5 10/09/2015   HCT 42.1 10/09/2015   MCV 92.4  10/09/2015   PLT 248 10/09/2015    Recent Labs  06/12/15 1441 10/05/15 0918 10/09/15 1445 03/20/16 0912  NA 132*  --  135  --   K 3.5  --  4.0  --   CL 99*  --  101  --   CO2 28  --  29  --   GLUCOSE 95  --  96  --   BUN 12  --  11  --   CREATININE 0.63 0.70 0.63 0.80  CALCIUM 9.0  --  9.3  --   GFRNONAA >60  --  >60  --   GFRAA >60  --  >60  --   PROT 6.6  --  7.3  --   ALBUMIN 3.8  --  4.3  --   AST 14*  --  15  --   ALT 14  --  12*  --   ALKPHOS 63  --  66  --   BILITOT 0.4  --  0.6  --    IMPRESSION: 1. No findings for residual or recurrent tumor, adenopathy or pulmonary metastatic disease. 2. Stable emphysematous changes, respiratory bronchiolitis and scarring. Small scattered airspace nodules are stable. 3. No findings for upper abdominal metastatic disease or osseous metastatic disease.   Electronically Signed   By: Marijo Sanes M.D.   On: 03/20/2016 10:40 RADIOGRAPHIC STUDIES: I have personally reviewed the radiological images as listed and agreed with the findings in the report. No results found.  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  #  Patient follow-up with [place] in approximately 6 months; scan prior.    I reviewed the images myself and with the patient and family in detail.         Cammie Sickle, MD 04/22/2016 5:51  PM

## 2016-04-22 NOTE — Assessment & Plan Note (Addendum)
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  #  Patient follow-up with [place] in approximately 6 months; scan prior.    I reviewed the images myself and with the patient and family in detail.

## 2016-04-22 NOTE — Progress Notes (Signed)
Patient here today for PET scan results.

## 2016-04-24 ENCOUNTER — Ambulatory Visit (INDEPENDENT_AMBULATORY_CARE_PROVIDER_SITE_OTHER): Payer: Medicare Other | Admitting: Physician Assistant

## 2016-04-24 ENCOUNTER — Encounter: Payer: Self-pay | Admitting: Physician Assistant

## 2016-04-24 VITALS — BP 110/70 | HR 86 | Temp 97.9°F | Resp 16 | Ht 71.0 in | Wt 189.8 lb

## 2016-04-24 DIAGNOSIS — Z1239 Encounter for other screening for malignant neoplasm of breast: Secondary | ICD-10-CM

## 2016-04-24 DIAGNOSIS — Z0000 Encounter for general adult medical examination without abnormal findings: Secondary | ICD-10-CM

## 2016-04-24 DIAGNOSIS — G2581 Restless legs syndrome: Secondary | ICD-10-CM | POA: Diagnosis not present

## 2016-04-24 DIAGNOSIS — Z1231 Encounter for screening mammogram for malignant neoplasm of breast: Secondary | ICD-10-CM | POA: Diagnosis not present

## 2016-04-24 DIAGNOSIS — E7800 Pure hypercholesterolemia, unspecified: Secondary | ICD-10-CM | POA: Diagnosis not present

## 2016-04-24 DIAGNOSIS — J449 Chronic obstructive pulmonary disease, unspecified: Secondary | ICD-10-CM

## 2016-04-24 DIAGNOSIS — Z23 Encounter for immunization: Secondary | ICD-10-CM | POA: Diagnosis not present

## 2016-04-24 MED ORDER — ROPINIROLE HCL 0.5 MG PO TABS: ORAL_TABLET | ORAL | 0 refills | Status: DC

## 2016-04-24 NOTE — Progress Notes (Signed)
Patient: Cathy Watts, Female    DOB: [birth year], 70 y.o.   MRN: [id number] Visit Date: 04/24/2016  Today's Provider: Mar Daring, PA-C   Chief Complaint  Patient presents with  . Medicare Wellness   Subjective:    Annual wellness visit Cathy Watts is a 70 y.o. female. She feels well. She reports exercising none. She reports she is sleeping fairly well, she reports having restless legs for the past few months.     Last CPE:12/201/16 BMD:05/07/2013 Total Femoral-NL Femoral neck and spine densities indicates Osteopenia. -----------------------------------------------------------   Review of Systems  Constitutional: Negative.   HENT: Positive for congestion.   Eyes: Negative.   Respiratory: Positive for apnea, cough and shortness of breath.   Gastrointestinal: Positive for constipation.  Endocrine: Negative.   Genitourinary: Negative.   Musculoskeletal: Negative.   Skin: Negative.   Allergic/Immunologic: Negative.   Neurological: Negative.   Hematological: Negative.   Psychiatric/Behavioral: Negative.     Social History   Social History  . Marital status: Married    Spouse name: N/A  . Number of children: N/A  . Years of education: N/A   Occupational History  . Not on file.   Social History Main Topics  . Smoking status: Former Smoker    Packs/day: 1.25    Years: 55.00    Types: Cigarettes    Quit date: 04/12/2016  . Smokeless tobacco: Never Used     Comment: used to use E-cig  . Alcohol use No  . Drug use: No  . Sexual activity: Not on file   Other Topics Concern  . Not on file   Social History Narrative  . No narrative on file    Past Medical History:  Diagnosis Date  . Anxiety   . Asthma   . Barrett's esophagus   . COPD (chronic obstructive pulmonary disease) ([place])   . Depression   . GERD (gastroesophageal reflux disease)   . History of peptic ulcer disease   . Low oxygen saturation    2l hs  . Osteopenia   .  Panic disorder   . Personal history of tobacco use, presenting hazards to health 01/16/2015  . Sleep apnea   . SOB (shortness of breath)   . Squamous cell carcinoma of right lung [facility])      Patient Active Problem List   Diagnosis Date Noted  . Chronic venous insufficiency 04/11/2016  . Leg pain 04/11/2016  . Varicose veins of bilateral lower extremities with pain 03/12/2016  . Cancer of lower lobe of right lung ([place]) 10/09/2015  . Oropharyngeal candidiasis 07/12/2015  . Nonspecific abnormal findings on radiological and examination of lung field 01/17/2015  . Personal history of tobacco use, presenting hazards to health 01/16/2015  . Anxiety 11/15/2014  . Airway hyperreactivity 11/15/2014  . CN (constipation) 11/15/2014  . Daytime somnolence 11/15/2014  . Clinical depression 11/15/2014  . DD (diverticular disease) 11/15/2014  . Accumulation of fluid in tissues 11/15/2014  . H/O peptic ulcer 11/15/2014  . Osteopenia 11/15/2014  . Awareness of heartbeats 11/15/2014  . Episodic paroxysmal anxiety disorder 11/15/2014  . Apnea, sleep 11/15/2014  . Snores 11/15/2014  . Breath shortness 11/15/2014  . Compulsive tobacco user syndrome 11/15/2014  . Avitaminosis D 11/15/2014  . Barrett esophagus 10/19/2014  . Acid reflux 10/19/2014  . COPD, moderate ([place]) 08/31/2013  . Dyspnea 08/31/2013  . Otitis, externa, infective 08/31/2013  . Nocturnal hypoxemia 08/31/2013  . Hypercholesterolemia without hypertriglyceridemia 11/05/2005  Past Surgical History:  Procedure Laterality Date  . CERVICAL CONE BIOPSY  1990  . CHOLECYSTECTOMY  1980's  . colonoscopy  2014  . ELBOW FRACTURE SURGERY Right 2009  . ENDOBRONCHIAL ULTRASOUND N/A 02/07/2015   Procedure: ENDOBRONCHIAL ULTRASOUND;  Surgeon: Flora Lipps, MD;  Location: [facility] ORS;  Service: Cardiopulmonary;  Laterality: N/A;  . ESOPHAGOGASTRODUODENOSCOPY (EGD) WITH PROPOFOL N/A 02/26/2016   Procedure: ESOPHAGOGASTRODUODENOSCOPY (EGD) WITH  PROPOFOL;  Surgeon: Lollie Sails, MD;  Location: [facility] ENDOSCOPY;  Service: Endoscopy;  Laterality: N/A;  COPD, Sleep Apnea  . TONSILLECTOMY  1979  . TUBAL LIGATION  1970's  . UPPER GI ENDOSCOPY      Her family history includes Arthritis in her mother; Breast cancer in her sister; Cancer in her paternal aunt; Cirrhosis in her sister; Colon cancer in her mother; Diabetes in her mother and sister; Glaucoma in her mother; Heart attack in her sister; Ovarian cancer in her sister; Parkinson's disease in her sister; Stomach cancer in her father; Throat cancer in her brother.      Current Outpatient Prescriptions:  .  albuterol (PROVENTIL HFA;VENTOLIN HFA) 108 (90 Base) MCG/ACT inhaler, Inhale 2 puffs into the lungs every 4 (four) hours as needed for wheezing or shortness of breath (or 30 minutes before exercize)., Disp: 1 Inhaler, Rfl: 10 .  aspirin EC 81 MG tablet, Take 81 mg by mouth every other day., Disp: , Rfl:  .  busPIRone (BUSPAR) 30 MG tablet, TAKE ONE TABLET BY MOUTH TWICE DAILY, Disp: 60 tablet, Rfl: 11 .  CALCIUM CARBONATE-VIT D-MIN PO, Take 1 tablet by mouth 3 (three) times daily., Disp: , Rfl:  .  clotrimazole-betamethasone (LOTRISONE) cream, LOTRISONE, 1-0.05% (External Cream)  1 (one) Cream Cream Apply twice daily to affected area.  Not for Internal use.  (Correction from prev script said not for external use and should read , not for internal use) for 0 days  Quantity: 45;  Refills: 1   Ordered :22-Apr-2013  Renaldo Fiddler ;  Started 22-Apr-2013 Active, Disp: , Rfl:  .  escitalopram (LEXAPRO) 20 MG tablet, TAKE ONE TABLET BY MOUTH ONCE DAILY, Disp: 90 tablet, Rfl: 1 .  fluticasone furoate-vilanterol (BREO ELLIPTA) 100-25 MCG/INH AEPB, INHALE ONE PUFF BY MOUTH ONCE DAILY, Disp: 60 each, Rfl: 11 .  Omega-3 Fatty Acids (FISH OIL) 1200 MG CAPS, Take 1 capsule by mouth daily., Disp: , Rfl:  .  omeprazole (PRILOSEC) 20 MG capsule, TAKE ONE CAPSULE BY MOUTH ONCE DAILY, Disp: 90 capsule,  Rfl: 3 .  polyethylene glycol powder (GLYCOLAX/MIRALAX) powder, TAKE 17 GRAMS OF POWDER MIXED IN 8 OUNCES OF WATER BY MOUTH ONCE A DAY., Disp: 255 g, Rfl: 3 .  pravastatin (PRAVACHOL) 20 MG tablet, TAKE ONE TABLET BY MOUTH AT BEDTIME, Disp: 90 tablet, Rfl: 1 .  Pseudoephedrine-Guaifenesin (MUCINEX D) [id number] MG TB12, Take 1 tablet by mouth 2 (two) times daily. , Disp: , Rfl:  .  umeclidinium bromide (INCRUSE ELLIPTA) 62.5 MCG/INH AEPB, Inhale 1 puff into the lungs daily., Disp: 30 each, Rfl: 11  Patient Care Team: Mar Daring, PA-C as PCP - General (Family Medicine)     Objective:   Vitals: BP 110/70 (BP Location: Left Arm, Patient Position: Sitting, Cuff Size: Normal)   Pulse 86   Temp 97.9 F (36.6 C) (Oral)   Resp 16   Ht '5\' 11"'$  (1.803 m)   Wt 189 lb 12.8 oz (86.1 kg)   BMI 26.47 kg/m   Physical Exam  Constitutional: She is oriented to  person, place, and time. She appears well-developed and well-nourished. No distress.  HENT:  Head: Normocephalic and atraumatic.  Right Ear: Tympanic membrane, external ear and ear canal normal.  Left Ear: Tympanic membrane, external ear and ear canal normal.  Nose: Nose normal.  Mouth/Throat: Uvula is midline, oropharynx is clear and moist and mucous membranes are normal. No oropharyngeal exudate.  Eyes: Conjunctivae and EOM are normal. Pupils are equal, round, and reactive to light. Right eye exhibits no discharge. Left eye exhibits no discharge. No scleral icterus.  Neck: Normal range of motion. Neck supple. No JVD present. Carotid bruit is not present. No tracheal deviation present. No thyromegaly present.  Cardiovascular: Normal rate, regular rhythm, normal heart sounds and intact distal pulses.  Exam reveals no gallop and no friction rub.   No murmur heard. Pulmonary/Chest: Effort normal and breath sounds normal. No respiratory distress. She has no wheezes. She has no rales. She exhibits no tenderness.  Abdominal: Soft. Bowel sounds  are normal. She exhibits no distension and no mass. There is no tenderness. There is no rebound and no guarding.  Musculoskeletal: Normal range of motion. She exhibits no edema or tenderness.  Lymphadenopathy:    She has no cervical adenopathy.  Neurological: She is alert and oriented to person, place, and time.  Skin: Skin is warm and dry. No rash noted. She is not diaphoretic.  Psychiatric: She has a normal mood and affect. Her behavior is normal. Judgment and thought content normal.  Vitals reviewed.   Activities of Daily Living In your present state of health, do you have any difficulty performing the following activities: 04/24/2016 04/25/2015  Hearing? N N  Vision? N N  Difficulty concentrating or making decisions? N N  Walking or climbing stairs? N N  Dressing or bathing? N N  Doing errands, shopping? N N  Some recent data might be hidden    Fall Risk Assessment Fall Risk  04/24/2016 10/09/2015 04/25/2015 02/15/2015  Falls in the past year? No No No No     Depression Screen PHQ 2/9 Scores 04/24/2016 10/09/2015 04/25/2015 02/15/2015  PHQ - 2 Score 0 0 0 0    Cognitive Testing - 6-CIT  Correct? Score   What year is it? yes 0 0 or 4  What month is it? yes 0 0 or 3  Memorize:    Pia Mau,  42,  [address]  [place],      What time is it? (within 1 hour) yes 0 0 or 3  Count backwards from 20 yes 0 0, 2, or 4  Name the months of the year yes 0 0, 2, or 4  Repeat name & address above yes 0 0, 2, 4, 6, 8, or 10       TOTAL SCORE  0/28   Interpretation:  Normal  Normal (0-7) Abnormal (8-28)   Audit-C Alcohol Use Screening  Question Answer Points  How often do you have alcoholic drink? never 0  On days you do drink alcohol, how many drinks do you typically consume? 0 0  How oftey will you drink 6 or more in a total? never 0  Total Score:  0   A score of 3 or more in women, and 4 or more in men indicates increased risk for alcohol abuse, EXCEPT if all of the points are  from question 1.     Assessment & Plan:     Annual Wellness Visit  Reviewed patient's Family Medical History Reviewed and updated  list of patient's medical providers Assessment of cognitive impairment was done Assessed patient's functional ability Established a written schedule for health screening [place] Completed and Reviewed  Exercise Activities and Dietary recommendations Goals    None      Immunization History  Administered Date(s) Administered  . Influenza Split 04/02/2013  . Influenza, Seasonal, Injecte, Preservative Fre 04/25/2015  . Influenza-Unspecified 02/08/2014  . Pneumococcal Conjugate-13 04/22/2014  . Pneumococcal Polysaccharide-23 04/20/2013  . Pneumococcal-Unspecified 04/02/2013, 04/10/2014  . Tdap 11/24/2009  . Zoster 07/19/2013    Health Maintenance  Topic Date Due  . Hepatitis C Screening  February 03, 1946  . MAMMOGRAM  03/21/1996  . COLONOSCOPY  03/21/1996  . DEXA SCAN  03/22/2011  . INFLUENZA VACCINE  12/05/2015  . TETANUS/TDAP  11/25/2019  . ZOSTAVAX  Completed  . PNA vac Low Risk Adult  Completed     Discussed health benefits of physical activity, and encouraged her to engage in regular exercise appropriate for her age and condition.    1. Medicare annual wellness visit, subsequent Normal physical exam today. No wheezing on exam. Patient has quit smoking x 2 weeks currently. Using Chantix to help her quit. She is to call if she needs continuation pack refill.   2. Need for influenza vaccination Flu vaccine given today without complication. Patient sat upright for 15 minutes to check for adverse reaction before being released. - Flu vaccine HIGH DOSE PF  3. Hypercholesterolemia without hypertriglyceridemia Stable. Will check labs as below and f/u pending results. - Comprehensive Metabolic Panel (CMET) - Lipid Profile  4. COPD, moderate ([place]) Stable and under control today. Will check labs as below and f/u pending  results. - CBC w/Diff/Platelet - Comprehensive Metabolic Panel (CMET)  5. Breast cancer screening Patient refused breast exam today. No family history of breast cancer. Personal history of lung cancer in current remission. Mammogram ordered as below and information given for [facility] breast clinic for patient to call and schedule at her convenience. - MM Digital Screening; Future  6. Restless leg syndrome Improved from previous. Use to be nightly and is now once or twice per week. Does effect her sleep. Will try Requip as below. She is to call if well tolerated for refill.  - rOPINIRole (REQUIP) 0.5 MG tablet; Take 1/2 tab PO 1 hr before bedtime x 3 days, then 1 tab PO x 3 days, then 2 tabs PO 1 hour before bedtime  Dispense: 60 tablet; Refill: 0  ------------------------------------------------------------------------------------------------------------    Mar Daring, PA-C  [facility]

## 2016-04-24 NOTE — Patient Instructions (Signed)

## 2016-05-03 ENCOUNTER — Other Ambulatory Visit: Payer: Self-pay | Admitting: Family Medicine

## 2016-05-03 ENCOUNTER — Telehealth: Payer: Self-pay

## 2016-05-03 DIAGNOSIS — F419 Anxiety disorder, unspecified: Secondary | ICD-10-CM

## 2016-05-03 LAB — CBC WITH DIFFERENTIAL/PLATELET
BASOS: 2 %
Basophils Absolute: 0.1 10*3/uL (ref 0.0–0.2)
EOS (ABSOLUTE): 0.2 10*3/uL (ref 0.0–0.4)
Eos: 4 %
HEMOGLOBIN: 14.8 g/dL (ref 11.1–15.9)
Hematocrit: 43.3 % (ref 34.0–46.6)
IMMATURE GRANS (ABS): 0 10*3/uL (ref 0.0–0.1)
Immature Granulocytes: 0 %
LYMPHS ABS: 1.4 10*3/uL (ref 0.7–3.1)
LYMPHS: 22 %
MCH: 31.4 pg (ref 26.6–33.0)
MCHC: 34.2 g/dL (ref 31.5–35.7)
MCV: 92 fL (ref 79–97)
MONOCYTES: 10 %
Monocytes Absolute: 0.6 10*3/uL (ref 0.1–0.9)
NEUTROS ABS: 4.1 10*3/uL (ref 1.4–7.0)
Neutrophils: 62 %
Platelets: 299 10*3/uL (ref 150–379)
RBC: 4.72 x10E6/uL (ref 3.77–5.28)
RDW: 13.9 % (ref 12.3–15.4)
WBC: 6.5 10*3/uL (ref 3.4–10.8)

## 2016-05-03 LAB — COMPREHENSIVE METABOLIC PANEL
A/G RATIO: 1.6 (ref 1.2–2.2)
ALBUMIN: 4 g/dL (ref 3.5–4.8)
ALT: 16 IU/L (ref 0–32)
AST: 13 IU/L (ref 0–40)
Alkaline Phosphatase: 113 IU/L (ref 39–117)
BILIRUBIN TOTAL: 0.3 mg/dL (ref 0.0–1.2)
BUN / CREAT RATIO: 21 (ref 12–28)
BUN: 15 mg/dL (ref 8–27)
CHLORIDE: 101 mmol/L (ref 96–106)
CO2: 26 mmol/L (ref 18–29)
Calcium: 9.6 mg/dL (ref 8.7–10.3)
Creatinine, Ser: 0.72 mg/dL (ref 0.57–1.00)
GFR calc non Af Amer: 85 mL/min/{1.73_m2} (ref >59)
GFR, EST AFRICAN AMERICAN: 98 mL/min/{1.73_m2} (ref >59)
Globulin, Total: 2.5 g/dL (ref 1.5–4.5)
Glucose: 100 mg/dL — ABNORMAL HIGH (ref 65–99)
POTASSIUM: 4.5 mmol/L (ref 3.5–5.2)
SODIUM: 142 mmol/L (ref 134–144)
TOTAL PROTEIN: 6.5 g/dL (ref 6.0–8.5)

## 2016-05-03 LAB — LIPID PANEL
Chol/HDL Ratio: 3.1 ratio units (ref 0.0–4.4)
Cholesterol, Total: 185 mg/dL (ref 100–199)
HDL: 59 mg/dL (ref >39)
LDL Calculated: 111 mg/dL — ABNORMAL HIGH (ref 0–99)
Triglycerides: 75 mg/dL (ref 0–149)
VLDL Cholesterol Cal: 15 mg/dL (ref 5–40)

## 2016-05-03 NOTE — Telephone Encounter (Signed)
Please review-aa 

## 2016-05-03 NOTE — Telephone Encounter (Signed)
Patient advised as below.  

## 2016-05-03 NOTE — Telephone Encounter (Signed)
-----   Message from Mar Daring, [place] sent at 05/03/2016  8:23 AM EST ----- All labs are within normal limits and stable.  Thanks! -JB

## 2016-05-07 ENCOUNTER — Other Ambulatory Visit: Payer: Self-pay | Admitting: Physician Assistant

## 2016-05-07 MED ORDER — VARENICLINE TARTRATE 1 MG PO TABS: 1.0000 mg | ORAL_TABLET | Freq: Two times a day (BID) | ORAL | 1 refills | Status: DC

## 2016-05-07 NOTE — Telephone Encounter (Signed)
Pt needs refill on Chantix?  To help quit smoking.  [address]  [phone]  Thanks Con Memos

## 2016-05-07 NOTE — Telephone Encounter (Signed)
Last ov 04/24/16 1. Medicare annual wellness visit, subsequent Normal physical exam today. No wheezing on exam. Patient has quit smoking x 2 weeks currently. Using Chantix to help her quit. She is to call if she needs continuation pack refill.    . Please review. Thank you. sd

## 2016-05-13 ENCOUNTER — Encounter: Payer: Self-pay | Admitting: Radiation Oncology

## 2016-05-13 ENCOUNTER — Ambulatory Visit
Admission: RE | Admit: 2016-05-13 | Discharge: 2016-05-13 | Disposition: A | Discharge status: Home / Self Care | Payer: Medicare Other | Source: Ambulatory Visit | Attending: Radiation Oncology | Admitting: Radiation Oncology

## 2016-05-13 VITALS — BP 128/85 | HR 89 | Temp 97.8°F | Resp 18 | Wt 191.1 lb

## 2016-05-13 DIAGNOSIS — Z85118 Personal history of other malignant neoplasm of bronchus and lung: Secondary | ICD-10-CM | POA: Insufficient documentation

## 2016-05-13 DIAGNOSIS — C3411 Malignant neoplasm of upper lobe, right bronchus or lung: Secondary | ICD-10-CM

## 2016-05-13 DIAGNOSIS — Z923 Personal history of irradiation: Secondary | ICD-10-CM | POA: Diagnosis not present

## 2016-05-13 NOTE — Progress Notes (Signed)
Radiation Oncology Follow up Note  Name: Cathy Watts   Date:   05/13/2016 MRN:  [id number] DOB: 1946/01/02    This 71 y.o. female presents to the clinic today for 11 month follow-up status post. 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 She is seen today in routine follow-up and is doing well. She specifically denies cough hemoptysis or chest tightness. Recent CT scan shows no evidence of disease with complete response..  COMPLICATIONS OF TREATMENT: none  FOLLOW UP COMPLIANCE: keeps appointments   PHYSICAL EXAM:  BP 128/85   Pulse 89   Temp 97.8 F (36.6 C)   Resp 18   Wt 191 lb 2.2 oz (86.7 kg)   BMI 26.66 kg/m  Well-developed well-nourished patient in NAD. HEENT reveals PERLA, EOMI, discs not visualized.  Oral cavity is clear. No oral mucosal lesions are identified. Neck is clear without evidence of cervical or supraclavicular adenopathy. Lungs are clear to A&P. Cardiac examination is essentially unremarkable with regular rate and rhythm without murmur rub or thrill. Abdomen is benign with no organomegaly or masses noted. Motor sensory and DTR levels are equal and symmetric in the upper and lower extremities. Cranial nerves II through XII are grossly intact. Proprioception is intact. No peripheral adenopathy or edema is identified. No motor or sensory levels are noted. Crude visual fields are within normal range.  RADIOLOGY RESULTS: Recent CT scan reviewed and compatible with the above-stated findings showing complete response  PLAN: At this time patient is doing well with no evidence of disease. I am please were overall progress. I've asked to see her out in 1 year for follow-up. She knows to call sooner with any concerns.  I would like to take this  opportunity to thank you for allowing me to participate in the care of your patient.Armstead Peaks., MD

## 2016-05-14 ENCOUNTER — Encounter (INDEPENDENT_AMBULATORY_CARE_PROVIDER_SITE_OTHER): Payer: Self-pay | Admitting: Vascular Surgery

## 2016-05-14 ENCOUNTER — Ambulatory Visit (INDEPENDENT_AMBULATORY_CARE_PROVIDER_SITE_OTHER): Payer: Medicare Other | Admitting: Vascular Surgery

## 2016-05-14 VITALS — BP 130/71 | HR 88 | Resp 17 | Wt 189.0 lb

## 2016-05-14 DIAGNOSIS — I83811 Varicose veins of right lower extremities with pain: Secondary | ICD-10-CM

## 2016-05-14 DIAGNOSIS — I83812 Varicose veins of left lower extremities with pain: Secondary | ICD-10-CM | POA: Diagnosis not present

## 2016-05-14 DIAGNOSIS — I83813 Varicose veins of bilateral lower extremities with pain: Secondary | ICD-10-CM

## 2016-05-14 NOTE — Progress Notes (Signed)
Varicose veins of the bilaterallower extremity with inflammation (454.1  I83.10) Current Plans  Indication: Patient presents with symptomatic varicose veins of the bilaterallower extremity.  Procedure: Sclerotherapy using hypertonic saline mixed with 1% Lidocaine was performed on the bilateral lower extremities. Compression wraps were placed. The patient tolerated the procedure well.  Plan: Follow up as needed.

## 2016-05-30 ENCOUNTER — Other Ambulatory Visit: Payer: Self-pay | Admitting: Physician Assistant

## 2016-05-30 DIAGNOSIS — G2581 Restless legs syndrome: Secondary | ICD-10-CM

## 2016-05-30 NOTE — Telephone Encounter (Signed)
Last ov 04/24/16 Last filled 04/24/16. Please review. Thank you. sd

## 2016-05-31 NOTE — Telephone Encounter (Signed)
Patient states medication has helped a lot. She takes 2 tablets at bedtime. Tolerating it well-aa

## 2016-05-31 NOTE — Telephone Encounter (Signed)
[id number] 

## 2016-05-31 NOTE — Telephone Encounter (Signed)
Did patient tolerate ropinirole? Per Jennis last note it was for restless leg and patient was to call if this went well for her and wanted a refill. Thanks.

## 2016-06-07 ENCOUNTER — Ambulatory Visit: Payer: Medicare Other | Admitting: Internal Medicine

## 2016-06-10 ENCOUNTER — Encounter: Payer: Self-pay | Admitting: Internal Medicine

## 2016-06-10 ENCOUNTER — Ambulatory Visit (INDEPENDENT_AMBULATORY_CARE_PROVIDER_SITE_OTHER): Payer: Medicare Other | Admitting: Internal Medicine

## 2016-06-10 VITALS — BP 136/78 | HR 83 | Ht 71.0 in | Wt 197.2 lb

## 2016-06-10 DIAGNOSIS — J449 Chronic obstructive pulmonary disease, unspecified: Secondary | ICD-10-CM | POA: Diagnosis not present

## 2016-06-10 NOTE — Progress Notes (Signed)
   Subjective:    Patient ID: Cathy Watts, female    DOB: [birth year], 71 y.o.   MRN: [id number]  Synopsis: Gold Grade B with moderate airflow obstruction 2015, still smoking as of 05/2015 Recent DX of SQ cell Lung cancer  CC: Follow up COPD HPI  Patient quit smoking 2 months ago Her wheezing and chest congestion and cough have improved She feels good CT and PET scan shows no residual cancer  No signs of infection, no acute resp issues  Review of Systems  Constitutional: Negative for chills, fatigue and fever.  HENT: Negative for congestion.   Respiratory: Positive for cough. Negative for choking, chest tightness, shortness of breath and wheezing.   Cardiovascular: Negative for chest pain and leg swelling.  Gastrointestinal: Negative.   All other systems reviewed and are negative.  There were no vitals taken for this visit.     Objective:   Physical Exam  Constitutional: She is oriented to person, place, and time. She appears well-developed and well-nourished. No distress.  HENT:  Head: Normocephalic and atraumatic.  Eyes: EOM are normal. Pupils are equal, round, and reactive to light.  Neck: Normal range of motion.  Cardiovascular: Normal rate, regular rhythm and normal heart sounds.   Pulmonary/Chest: Effort normal. No stridor. No respiratory distress. She has no wheezes. She has no rales.  Abdominal: Soft.  Neurological: She is alert and oriented to person, place, and time.  Skin: She is not diaphoretic.         Assessment & Plan:    71 yo white female SEP 2016- SQUAMOUS CELL LUNG CA STAGE IA with COPD  Gold Stage A with chronic hypoxic resp failure   1.hold BREO and Incruse as patient stopped smoking and will assess resp status 2.albuterol as needed 2.continue oxygen therapy at night with 2 L  3.continue smoking cessation   Follow up in 2  Months, Will assess resp status off of inhalers  The Patient requires high complexity decision making for  assessment and support, frequent evaluation and titration of therapies. Patient satisfied with Plan of action and management. All questions answered  Corrin Parker, M.D.  [facility]  Medical Director [place] Director [facility] Cardio-Pulmonary Department

## 2016-06-10 NOTE — Patient Instructions (Signed)
CONGRATULATIONS!!!!!!! YOU QUIT SMOKING!!!!! WE ARE VERY PROUD OF YOU!!!  Follow up in 2 months STOP taking INHALERS AND SEE HOW YOUR BREATHING DOES

## 2016-06-11 ENCOUNTER — Ambulatory Visit (INDEPENDENT_AMBULATORY_CARE_PROVIDER_SITE_OTHER): Payer: Medicare Other | Admitting: Vascular Surgery

## 2016-06-25 ENCOUNTER — Telehealth: Payer: Self-pay | Admitting: Internal Medicine

## 2016-06-25 ENCOUNTER — Ambulatory Visit (INDEPENDENT_AMBULATORY_CARE_PROVIDER_SITE_OTHER): Payer: Medicare Other | Admitting: Vascular Surgery

## 2016-06-25 MED ORDER — DOXYCYCLINE HYCLATE 100 MG PO TABS: 100.0000 mg | ORAL_TABLET | Freq: Two times a day (BID) | ORAL | 0 refills | Status: DC

## 2016-06-25 MED ORDER — PREDNISONE 20 MG PO TABS: 40.0000 mg | ORAL_TABLET | Freq: Every day | ORAL | 0 refills | Status: AC

## 2016-06-25 NOTE — Telephone Encounter (Signed)
LMOM for pt to call back.

## 2016-06-25 NOTE — Telephone Encounter (Signed)
Pt states she has had a cold since Saturday. States she has a cough, chest and throat is sore and burns. Please call.

## 2016-06-25 NOTE — Telephone Encounter (Signed)
Prednisone and doxycycline orders sent to her pharmacy  Thanks  Waunita Schooner

## 2016-06-25 NOTE — Telephone Encounter (Signed)
Spoke with pt. Cathy Watts pt that was seen 06/10/16 for COPD. States she has a prod cough with thick white mucus. Wheezing at times and states throat and chest hurts when coughing. Denies any fever. Started Friday and has gotten worse. Please advise.

## 2016-06-27 NOTE — Telephone Encounter (Signed)
LMOM to make sure pt picked up meds sent. Asked her to call back with any questions.

## 2016-07-01 ENCOUNTER — Other Ambulatory Visit: Payer: Self-pay | Admitting: Physician Assistant

## 2016-07-01 DIAGNOSIS — G2581 Restless legs syndrome: Secondary | ICD-10-CM

## 2016-07-02 MED ORDER — ROPINIROLE HCL 1 MG PO TABS: 1.0000 mg | ORAL_TABLET | Freq: Every day | ORAL | 3 refills | Status: DC

## 2016-07-02 NOTE — Telephone Encounter (Signed)
Can we see if she is taking this and how much she is taking so I can update the dosage. The Rx is for a titrating dose. If she is taking can we see if the dose she is taking is working.  Thanks.

## 2016-07-02 NOTE — Telephone Encounter (Signed)
Patient reports she is taking 2 tablets an hour before bedtime. Patient reports that symptoms are worsening in the last 2 nights. Patient requesting to increase her dose. Please review. Thank you. sd

## 2016-07-02 NOTE — Telephone Encounter (Signed)
Patient advised as below. Patient verbalizes understanding and is in agreement with treatment plan.  

## 2016-07-02 NOTE — Telephone Encounter (Signed)
Dose increased to '1mg'$  tab to take 1-2 tabs PO q h.s. For RLS. This was sent to [place]

## 2016-07-11 ENCOUNTER — Ambulatory Visit (INDEPENDENT_AMBULATORY_CARE_PROVIDER_SITE_OTHER): Payer: Medicare Other | Admitting: Vascular Surgery

## 2016-07-11 ENCOUNTER — Other Ambulatory Visit: Payer: Self-pay | Admitting: Pulmonary Disease

## 2016-07-11 ENCOUNTER — Encounter (INDEPENDENT_AMBULATORY_CARE_PROVIDER_SITE_OTHER): Payer: Self-pay | Admitting: Vascular Surgery

## 2016-07-11 VITALS — BP 132/87 | HR 95 | Resp 17 | Wt 202.0 lb

## 2016-07-11 DIAGNOSIS — I83811 Varicose veins of right lower extremities with pain: Secondary | ICD-10-CM

## 2016-07-11 DIAGNOSIS — I83812 Varicose veins of left lower extremities with pain: Secondary | ICD-10-CM | POA: Diagnosis not present

## 2016-07-11 DIAGNOSIS — I83813 Varicose veins of bilateral lower extremities with pain: Secondary | ICD-10-CM

## 2016-07-11 NOTE — Progress Notes (Signed)
Varicose veins of the bilaterallower extremity with inflammation (454.1  I83.10) Current Plans  Indication: Patient presents with symptomatic varicose veins of the bilaterallower extremity.  Procedure: Sclerotherapy using hypertonic saline mixed with 1% Lidocaine was performed on the bilateral lower extremities. Compression wraps were placed. The patient tolerated the procedure well.  Plan: Follow up as needed.

## 2016-08-13 ENCOUNTER — Other Ambulatory Visit: Payer: Self-pay | Admitting: Pulmonary Disease

## 2016-08-19 ENCOUNTER — Ambulatory Visit (INDEPENDENT_AMBULATORY_CARE_PROVIDER_SITE_OTHER): Payer: Medicare Other | Admitting: Internal Medicine

## 2016-08-19 ENCOUNTER — Encounter: Payer: Self-pay | Admitting: Internal Medicine

## 2016-08-19 VITALS — BP 144/88 | HR 98 | Wt 208.0 lb

## 2016-08-19 DIAGNOSIS — J441 Chronic obstructive pulmonary disease with (acute) exacerbation: Secondary | ICD-10-CM | POA: Diagnosis not present

## 2016-08-19 DIAGNOSIS — J449 Chronic obstructive pulmonary disease, unspecified: Secondary | ICD-10-CM | POA: Diagnosis not present

## 2016-08-19 MED ORDER — VARENICLINE TARTRATE 0.5 MG X 11 & 1 MG X 42 PO MISC
ORAL | 0 refills | Status: DC
Start: 2016-08-19 — End: 2016-10-28

## 2016-08-19 MED ORDER — VARENICLINE TARTRATE 1 MG PO TABS: 1.0000 mg | ORAL_TABLET | Freq: Two times a day (BID) | ORAL | 1 refills | Status: DC

## 2016-08-19 MED ORDER — ALBUTEROL SULFATE HFA 108 (90 BASE) MCG/ACT IN AERS: 2.0000 | INHALATION_SPRAY | RESPIRATORY_TRACT | 10 refills | Status: DC | PRN

## 2016-08-19 NOTE — Addendum Note (Signed)
Addended by: Oscar La R on: 08/19/2016 02:01 PM   Modules accepted: Orders

## 2016-08-19 NOTE — Patient Instructions (Signed)
RE-Prescribe Chantix  STOP SMOKING!!!!!!!!!!!!!!!!!!!!

## 2016-08-19 NOTE — Addendum Note (Signed)
Addended by: Oscar La R on: 08/19/2016 01:59 PM   Modules accepted: Orders

## 2016-08-19 NOTE — Progress Notes (Signed)
   Subjective:    Patient ID: Cathy Watts, female    DOB: 1945-05-30, 70 y.o.   MRN: [id number]  Synopsis: Gold Grade B with moderate airflow obstruction 2015, still smoking as of 05/2015 Recent DX of SQ cell Lung cancer  CC: Follow up COPD HPI  Patient restarted smoking again 2 weeks ago Her wheezing and chest congestion and cough have been intermittent CT and PET scan shows no residual cancer  No signs of infection, no acute resp issues  Review of Systems  Constitutional: Negative for chills, fatigue and fever.  HENT: Negative for congestion.   Respiratory: Positive for cough. Negative for choking, chest tightness, shortness of breath and wheezing.   Cardiovascular: Negative for chest pain and leg swelling.  Gastrointestinal: Negative.   All other systems reviewed and are negative.  BP (!) 144/88 (BP Location: Left Arm, Cuff Size: Normal)   Pulse 98   Wt 208 lb (94.3 kg)   SpO2 97%   BMI 29.01 kg/m      Objective:   Physical Exam  Constitutional: She is oriented to person, place, and time. She appears well-developed and well-nourished. No distress.  HENT:  Head: Normocephalic and atraumatic.  Eyes: EOM are normal. Pupils are equal, round, and reactive to light.  Neck: Normal range of motion.  Cardiovascular: Normal rate, regular rhythm and normal heart sounds.   Pulmonary/Chest: Effort normal. No stridor. No respiratory distress. She has no wheezes. She has no rales.  Abdominal: Soft.  Neurological: She is alert and oriented to person, place, and time.  Skin: She is not diaphoretic.         Assessment & Plan:    71 yo white female SEP 2016- SQUAMOUS CELL LUNG CA STAGE IA with COPD  Gold Stage A with chronic hypoxic resp failure  SMOKING CESSATION STRONGLY ADVISED, WILL RE-START CHANTIX   1.hold BREO and Incruse as per patient request 2.albuterol as needed 3.continue oxygen therapy at night with 2 L [place] 4.will re-prescribe chantix.    Follow up in 6   Months  Patient satisfied with Plan of action and management. All questions answered  Corrin Parker, M.D.  [facility]  Medical Director [place] Director [facility] Cardio-Pulmonary Department

## 2016-08-20 ENCOUNTER — Other Ambulatory Visit: Payer: Self-pay | Admitting: *Deleted

## 2016-08-20 MED ORDER — ALBUTEROL SULFATE HFA 108 (90 BASE) MCG/ACT IN AERS: 2.0000 | INHALATION_SPRAY | RESPIRATORY_TRACT | 3 refills | Status: DC | PRN

## 2016-08-29 ENCOUNTER — Other Ambulatory Visit: Payer: Self-pay | Admitting: Physician Assistant

## 2016-08-29 DIAGNOSIS — E7800 Pure hypercholesterolemia, unspecified: Secondary | ICD-10-CM

## 2016-09-18 ENCOUNTER — Emergency Department
Admission: EM | Admit: 2016-09-18 | Discharge: 2016-09-18 | Disposition: A | Discharge status: Home / Self Care | Payer: Medicare Other | Attending: Emergency Medicine | Admitting: Emergency Medicine

## 2016-09-18 ENCOUNTER — Encounter: Payer: Self-pay | Admitting: Emergency Medicine

## 2016-09-18 ENCOUNTER — Emergency Department: Payer: Medicare Other

## 2016-09-18 ENCOUNTER — Telehealth: Payer: Self-pay

## 2016-09-18 DIAGNOSIS — J449 Chronic obstructive pulmonary disease, unspecified: Secondary | ICD-10-CM | POA: Insufficient documentation

## 2016-09-18 DIAGNOSIS — R42 Dizziness and giddiness: Secondary | ICD-10-CM

## 2016-09-18 DIAGNOSIS — Z87891 Personal history of nicotine dependence: Secondary | ICD-10-CM | POA: Diagnosis not present

## 2016-09-18 DIAGNOSIS — J45909 Unspecified asthma, uncomplicated: Secondary | ICD-10-CM | POA: Insufficient documentation

## 2016-09-18 DIAGNOSIS — R55 Syncope and collapse: Secondary | ICD-10-CM | POA: Diagnosis not present

## 2016-09-18 LAB — CBC
HCT: 41.6 % (ref 35.0–47.0)
Hemoglobin: 14.1 g/dL (ref 12.0–16.0)
MCH: 31 pg (ref 26.0–34.0)
MCHC: 33.8 g/dL (ref 32.0–36.0)
MCV: 91.7 fL (ref 80.0–100.0)
PLATELETS: 273 10*3/uL (ref 150–440)
RBC: 4.53 MIL/uL (ref 3.80–5.20)
RDW: 14 % (ref 11.5–14.5)
WBC: 9 10*3/uL (ref 3.6–11.0)

## 2016-09-18 LAB — BASIC METABOLIC PANEL
ANION GAP: 8 (ref 5–15)
BUN: 19 mg/dL (ref 6–20)
CALCIUM: 9.5 mg/dL (ref 8.9–10.3)
CO2: 30 mmol/L (ref 22–32)
CREATININE: 0.79 mg/dL (ref 0.44–1.00)
Chloride: 99 mmol/L — ABNORMAL LOW (ref 101–111)
GLUCOSE: 105 mg/dL — AB (ref 65–99)
Potassium: 3.7 mmol/L (ref 3.5–5.1)
Sodium: 137 mmol/L (ref 135–145)

## 2016-09-18 LAB — URINALYSIS, COMPLETE (UACMP) WITH MICROSCOPIC
Bacteria, UA: NONE SEEN
Bilirubin Urine: NEGATIVE
Glucose, UA: NEGATIVE mg/dL
Ketones, ur: NEGATIVE mg/dL
NITRITE: NEGATIVE
PH: 6 (ref 5.0–8.0)
Protein, ur: NEGATIVE mg/dL
SPECIFIC GRAVITY, URINE: 1.015 (ref 1.005–1.030)

## 2016-09-18 LAB — TROPONIN I

## 2016-09-18 LAB — GLUCOSE, CAPILLARY: GLUCOSE-CAPILLARY: 94 mg/dL (ref 65–99)

## 2016-09-18 NOTE — ED Provider Notes (Signed)
[facility] Emergency Department Provider Note ____________________________________________   I have reviewed the triage vital signs and the triage nursing note.  HISTORY  Chief Complaint Loss of Consciousness and Dizziness   Historian Patient  HPI Cathy Watts is a 71 y.o. female presenting with about a week of intermittent dizziness. She states that about a week ago she woke up in the middle the night and felt like her vision was closing and/or that things were far away and she felt lightheaded. No syncope. No chest pain. No breathing problems although she does have underlying chronic COPD. No recent fevers. Mild occasional global headache but more so a lightheadedness feeling. No weakness or numbness. No tick bites. No skin rash.  currentlyfeels a little lightheaded. Unclear but seems like standing up makes it worse.    Past Medical History:  Diagnosis Date  . Anxiety   . Asthma   . Barrett's esophagus   . COPD (chronic obstructive pulmonary disease) ([place])   . Depression   . GERD (gastroesophageal reflux disease)   . History of peptic ulcer disease   . Low oxygen saturation    2l hs  . Osteopenia   . Panic disorder   . Personal history of tobacco use, presenting hazards to health 01/16/2015  . Sleep apnea   . SOB (shortness of breath)   . Squamous cell carcinoma of right lung [facility])     Patient Active Problem List   Diagnosis Date Noted  . Chronic venous insufficiency 04/11/2016  . Leg pain 04/11/2016  . Varicose veins of bilateral lower extremities with pain 03/12/2016  . Cancer of lower lobe of right lung ([place]) 10/09/2015  . Oropharyngeal candidiasis 07/12/2015  . Personal history of tobacco use, presenting hazards to health 01/16/2015  . Airway hyperreactivity 11/15/2014  . CN (constipation) 11/15/2014  . Daytime somnolence 11/15/2014  . Clinical depression 11/15/2014  . DD (diverticular disease) 11/15/2014  . Accumulation of fluid  in tissues 11/15/2014  . H/O peptic ulcer 11/15/2014  . Osteopenia 11/15/2014  . Awareness of heartbeats 11/15/2014  . Episodic paroxysmal anxiety disorder 11/15/2014  . Apnea, sleep 11/15/2014  . Snores 11/15/2014  . Breath shortness 11/15/2014  . Compulsive tobacco user syndrome 11/15/2014  . Avitaminosis D 11/15/2014  . Barrett esophagus 10/19/2014  . Acid reflux 10/19/2014  . COPD, moderate ([place]) 08/31/2013  . Dyspnea 08/31/2013  . Otitis, externa, infective 08/31/2013  . Nocturnal hypoxemia 08/31/2013  . Hypercholesterolemia without hypertriglyceridemia 11/05/2005    Past Surgical History:  Procedure Laterality Date  . CERVICAL CONE BIOPSY  1990  . CHOLECYSTECTOMY  1980's  . colonoscopy  2014  . ELBOW FRACTURE SURGERY Right 2009  . ENDOBRONCHIAL ULTRASOUND N/A 02/07/2015   Procedure: ENDOBRONCHIAL ULTRASOUND;  Surgeon: Flora Lipps, MD;  Location: [facility] ORS;  Service: Cardiopulmonary;  Laterality: N/A;  . ESOPHAGOGASTRODUODENOSCOPY (EGD) WITH PROPOFOL N/A 02/26/2016   Procedure: ESOPHAGOGASTRODUODENOSCOPY (EGD) WITH PROPOFOL;  Surgeon: Lollie Sails, MD;  Location: [facility] ENDOSCOPY;  Service: Endoscopy;  Laterality: N/A;  COPD, Sleep Apnea  . TONSILLECTOMY  1979  . TUBAL LIGATION  1970's  . UPPER GI ENDOSCOPY      Prior to Admission medications   Medication Sig Start Date End Date Taking? Authorizing Provider  aspirin EC 81 MG tablet Take 81 mg by mouth every other day.   Yes [provider]  busPIRone (BUSPAR) 30 MG tablet TAKE ONE TABLET BY MOUTH TWICE DAILY 09/21/15  Yes Margarita Rana, MD  CALCIUM CARBONATE-VIT D-MIN PO  Take 1 tablet by mouth 2 (two) times daily.  01/02/11  Yes [provider]  clotrimazole-betamethasone (LOTRISONE) cream LOTRISONE, 1-0.05% (External Cream)  1 (one) Cream Cream Apply twice daily to affected area.  Not for Internal use.  (Correction from prev script said not for external use and should read , not for internal use) for 0  days  Quantity: 45;  Refills: 1   Ordered :22-Apr-2013  Renaldo Fiddler ;  Started 22-Apr-2013 Active 04/22/13  Yes [provider]  escitalopram (LEXAPRO) 20 MG tablet TAKE ONE TABLET BY MOUTH ONCE DAILY 05/03/16  Yes Mar Daring, PA-C  Omega-3 Fatty Acids (FISH OIL) 1200 MG CAPS Take 1 capsule by mouth daily.   Yes [provider]  omeprazole (PRILOSEC) 20 MG capsule TAKE ONE CAPSULE BY MOUTH ONCE DAILY 06/06/15  Yes Burnette, Anderson Malta M, PA-C  polyethylene glycol powder (GLYCOLAX/MIRALAX) powder TAKE 17 GRAMS OF POWDER MIXED IN 8 OUNCES OF WATER BY MOUTH ONCE A DAY. 11/08/15  Yes Fenton Malling M, PA-C  pravastatin (PRAVACHOL) 20 MG tablet TAKE ONE TABLET BY MOUTH AT BEDTIME 08/29/16  Yes Burnette, Jennifer M, PA-C  rOPINIRole (REQUIP) 1 MG tablet Take 1-2 tablets (1-2 mg total) by mouth at bedtime. 07/02/16  Yes Mar Daring, PA-C  varenicline (CHANTIX STARTING MONTH PAK) 0.5 MG X 11 & 1 MG X 42 tablet Take one 0.5 mg tablet by mouth once daily for 3 days, then increase to one 0.5 mg tablet twice daily for 4 days, then increase to one 1 mg tablet twice daily. 08/19/16  Yes Flora Lipps, MD  albuterol (PROAIR HFA) 108 (90 Base) MCG/ACT inhaler Inhale 2 puffs into the lungs every 4 (four) hours as needed for wheezing or shortness of breath. Patient not taking: Reported on 09/18/2016 08/20/16   Flora Lipps, MD  fluticasone furoate-vilanterol (BREO ELLIPTA) 100-25 MCG/INH AEPB INHALE ONE PUFF BY MOUTH ONCE DAILY Patient not taking: Reported on 08/19/2016 02/29/16   Flora Lipps, MD  umeclidinium bromide (INCRUSE ELLIPTA) 62.5 MCG/INH AEPB Inhale 1 puff into the lungs daily. Patient not taking: Reported on 08/19/2016 02/29/16   Flora Lipps, MD  varenicline (CHANTIX CONTINUING MONTH PAK) 1 MG tablet Take 1 tablet (1 mg total) by mouth 2 (two) times daily. Patient not taking: Reported on 09/18/2016 08/19/16   Flora Lipps, MD    Allergies  Allergen Reactions  .  Amoxicillin Hives and Itching    Family History  Problem Relation Age of Onset  . Colon cancer Mother        colon  . Diabetes Mother   . Arthritis Mother   . Glaucoma Mother   . Stomach cancer Father        stomach  . Throat cancer Brother        throat  . Breast cancer Sister        breast  . Diabetes Sister   . Cirrhosis Sister   . Cancer Paternal Aunt   . Parkinson's disease Sister   . Heart attack Sister   . Ovarian cancer Sister     Social History Social History  Substance Use Topics  . Smoking status: Former Smoker    Packs/day: 1.25    Years: 55.00    Types: Cigarettes    Quit date: 04/12/2016  . Smokeless tobacco: Never Used     Comment: used to use E-cig  . Alcohol use No    Review of Systems  Constitutional: Negative for fever. Eyes: Negative for visual changes.  ENT: Negative for sore throat. Cardiovascular: Negative for chest pain. Respiratory: Negative for shortness of breath. Gastrointestinal: Negative for abdominal pain, vomiting and diarrhea. Genitourinary: Negative for dysuria. Musculoskeletal: Negative for back pain. Skin: Negative for rash. Neurological:no seizure. No altered mental status.  ____________________________________________   PHYSICAL EXAM:  VITAL SIGNS: ED Triage Vitals  Enc Vitals Group     BP 09/18/16 1336 (!) 156/89     Pulse Rate 09/18/16 1336 91     Resp 09/18/16 1336 18     Temp 09/18/16 1336 98.2 F (36.8 C)     Temp Source 09/18/16 1336 Oral     SpO2 09/18/16 1336 99 %     Weight 09/18/16 1337 208 lb (94.3 kg)     Height 09/18/16 1337 '5\' 10"'$  (1.778 m)     Head Circumference --      Peak Flow --      Pain Score --      Pain Loc --      Pain Edu? --      Excl. in [place]? --      Constitutional: Alert and oriented. Well appearing and in no distress. HEENT   Head: Normocephalic and atraumatic.      Eyes: Conjunctivae are normal. PERRL. Normal extraocular movements.      Ears:         Nose: No  congestion/rhinnorhea.   Mouth/Throat: Mucous membranes are moist.   Neck: No stridor. Cardiovascular/Chest: Normal rate, regular rhythm.  No murmurs, rubs, or gallops. Respiratory: Normal respiratory effort without tachypnea nor retractions. Breath sounds are clear and equal bilaterally. No wheezes/rales/rhonchi. Gastrointestinal: Soft. No distention, no guarding, no rebound. Nontender.    Genitourinary/rectal:Deferred Musculoskeletal: Nontender with normal range of motion in all extremities. No joint effusions.  No lower extremity tenderness.  No edema. Neurologic: no facial droop. Finger-nose intact bilaterally. No pronator drift. Normal speech and language. No gross or focal neurologic deficits are appreciated. Skin:  Skin is warm, dry and intact. No rash noted. Psychiatric: Mood and affect are normal. Speech and behavior are normal. Patient exhibits appropriate insight and judgment.   ____________________________________________  LABS (pertinent positives/negatives)  Labs Reviewed  BASIC METABOLIC PANEL - Abnormal; Notable for the following:       Result Value   Chloride 99 (*)    Glucose, Bld 105 (*)    All other components within normal limits  URINALYSIS, COMPLETE (UACMP) WITH MICROSCOPIC - Abnormal; Notable for the following:    Color, Urine YELLOW (*)    APPearance CLEAR (*)    Hgb urine dipstick SMALL (*)    Leukocytes, UA MODERATE (*)    Squamous Epithelial / LPF 0-5 (*)    Non Squamous Epithelial 0-5 (*)    All other components within normal limits  CBC  TROPONIN I  GLUCOSE, CAPILLARY  CBG MONITORING, ED    ____________________________________________    EKG I, Lisa Roca, MD, the attending physician have personally viewed and interpreted all ECGs.  90 bpm. Normal sinus rhythm. normal axis. Occasional PVC. Normal ST and T-wave ____________________________________________  RADIOLOGY All Xrays were viewed by me. Imaging interpreted by  Radiologist.  CT head without contrast:  IMPRESSION: No CT evidence for acute intracranial abnormality. __________________________________________  PROCEDURES  Procedure(s) performed: None  Critical Care performed: None  ____________________________________________   ED COURSE / ASSESSMENT AND PLAN  Pertinent labs & imaging results that were available during my care of the patient were reviewed by me and considered in my medical decision making (  see chart for details).   Miss Brooke overall is well-appearing with stable vital signs. She's had intermittent dizziness especially with standing up. No cardiac symptoms per se, EKG is reassuring.  Laboratory studies are also reassuring. I did discuss with her obtaining head CT since she's had occasional mild headaches in the dizziness is unusual for her although she does not have a focal neurologic deficit.  Given reassuring head CT and laboratory studies and physical exam, I think it is okay for her to dge from the emergency department today. I am asking her follow-up with primary care doctor which she has, and I'm referring her to cardiology, which they may consider Holter monitoring potentially.       CONSULTATIONS:   None   Patient / Family / Caregiver informed of clinical course, medical decision-making process, and agree with plan.   I discussed return precautions, follow-up instructions, and discharge instructions with patient and/or family.  Discharge instructions:  Although no certain cause was found, your exam and evaluation are reassuring in the emergency room today.  Please return to the emergency department immediately for any new or worsening symptoms including chest pain, trouble breathing, confusion or altered mental status, weakness, numbness, passing out, or any other symptoms concerning to you.  ___________________________________________   FINAL CLINICAL IMPRESSION(S) / ED DIAGNOSES   Final diagnoses:   Dizziness  Near syncope              Note: This dictation was prepared with Dragon dictation. Any transcriptional errors that result from this process are unintentional    Lisa Roca, MD 09/18/16 1758

## 2016-09-18 NOTE — Telephone Encounter (Signed)
Agreed and I was available for questions as needed.

## 2016-09-18 NOTE — Telephone Encounter (Signed)
Patient's daughter [name] calling that her mother(pt) is feeling weak, nauseas, dizzy and passed out and fell to the floor Saturday and she passed out yesterday again. Per daughter pt's BP is normal and heart rate. Unable to provide reading because she was not with her mother. Patient has COPD and malignant neoplasm of upper lobe of right lung. Per Tawanna Sat prefers and advises for patient to go to the hospital for possible blood work and imaging. Specially that she passed out she might need a CT scan. Patient's daughter agree and will follow up after hospital visit.  Thanks,  -Jeanie Mccard

## 2016-09-18 NOTE — ED Triage Notes (Signed)
Pt reports dizziness and fainting spells for three days. Denies pain. Pt reports had nausea and vomiting last week. Pt reports had episode of dizziness and fainting last week as well. Pt reports eating and drinking fluids as normal. Pt recently stopped smoking.

## 2016-09-18 NOTE — Discharge Instructions (Signed)
Although no certain cause was found, your exam and evaluation are reassuring in the emergency room today.  Please return to the emergency department immediately for any new or worsening symptoms including chest pain, trouble breathing, confusion or altered mental status, weakness, numbness, passing out, or any other symptoms concerning to you.

## 2016-09-27 ENCOUNTER — Telehealth: Payer: Self-pay | Admitting: Internal Medicine

## 2016-09-27 NOTE — Telephone Encounter (Signed)
Patient has a bad cough and wants to know if Cathy Watts can send her in something for it. Please call.

## 2016-09-27 NOTE — Telephone Encounter (Signed)
Per DS pt can take Delsym 45mls Q12 hrs PRN. Pt informed. Nothing further needed.

## 2016-09-27 NOTE — Telephone Encounter (Signed)
Please advise on message below that pt is having a bad cough and would like to get something for it. DK pt that was last seen 08/19/16 for COPD. thanks

## 2016-10-17 ENCOUNTER — Other Ambulatory Visit: Payer: Self-pay | Admitting: Physician Assistant

## 2016-10-17 DIAGNOSIS — F419 Anxiety disorder, unspecified: Secondary | ICD-10-CM

## 2016-10-17 MED ORDER — BUSPIRONE HCL 30 MG PO TABS: 30.0000 mg | ORAL_TABLET | Freq: Two times a day (BID) | ORAL | 11 refills | Status: DC

## 2016-10-17 NOTE — Telephone Encounter (Signed)
[facility] faxed a refill request on the following medications:  busPIRone (BUSPAR) 30 MG tablet.  Take 1 tablet by mouth twice a day.  [address]/[place]

## 2016-10-17 NOTE — Telephone Encounter (Signed)
Refill sent.

## 2016-10-21 ENCOUNTER — Ambulatory Visit
Admission: RE | Admit: 2016-10-21 | Discharge: 2016-10-21 | Disposition: A | Discharge status: Home / Self Care | Payer: Medicare Other | Source: Ambulatory Visit | Attending: Internal Medicine | Admitting: Internal Medicine

## 2016-10-21 DIAGNOSIS — C3431 Malignant neoplasm of lower lobe, right bronchus or lung: Secondary | ICD-10-CM | POA: Diagnosis present

## 2016-10-21 DIAGNOSIS — M4854XA Collapsed vertebra, not elsewhere classified, thoracic region, initial encounter for fracture: Secondary | ICD-10-CM | POA: Diagnosis not present

## 2016-10-21 DIAGNOSIS — Z85118 Personal history of other malignant neoplasm of bronchus and lung: Secondary | ICD-10-CM | POA: Diagnosis not present

## 2016-10-21 DIAGNOSIS — Z9889 Other specified postprocedural states: Secondary | ICD-10-CM | POA: Diagnosis not present

## 2016-10-21 DIAGNOSIS — J439 Emphysema, unspecified: Secondary | ICD-10-CM | POA: Insufficient documentation

## 2016-10-21 DIAGNOSIS — I700 Atherosclerosis of aorta: Secondary | ICD-10-CM | POA: Insufficient documentation

## 2016-10-21 MED ORDER — IOPAMIDOL (ISOVUE-300) INJECTION 61%
75.0000 mL | Freq: Once | INTRAVENOUS | Status: AC | PRN
  Administered 2016-10-21: 75 mL via INTRAVENOUS

## 2016-10-28 ENCOUNTER — Inpatient Hospital Stay: Payer: Medicare Other | Attending: Internal Medicine | Admitting: Internal Medicine

## 2016-10-28 VITALS — BP 114/83 | HR 87 | Temp 97.8°F | Resp 18 | Wt 210.5 lb

## 2016-10-28 DIAGNOSIS — F419 Anxiety disorder, unspecified: Secondary | ICD-10-CM | POA: Insufficient documentation

## 2016-10-28 DIAGNOSIS — Z87891 Personal history of nicotine dependence: Secondary | ICD-10-CM | POA: Diagnosis not present

## 2016-10-28 DIAGNOSIS — M8580 Other specified disorders of bone density and structure, unspecified site: Secondary | ICD-10-CM | POA: Insufficient documentation

## 2016-10-28 DIAGNOSIS — Z9049 Acquired absence of other specified parts of digestive tract: Secondary | ICD-10-CM | POA: Diagnosis not present

## 2016-10-28 DIAGNOSIS — K2270 Barrett's esophagus without dysplasia: Secondary | ICD-10-CM | POA: Insufficient documentation

## 2016-10-28 DIAGNOSIS — Z79899 Other long term (current) drug therapy: Secondary | ICD-10-CM | POA: Diagnosis not present

## 2016-10-28 DIAGNOSIS — M4854XA Collapsed vertebra, not elsewhere classified, thoracic region, initial encounter for fracture: Secondary | ICD-10-CM | POA: Insufficient documentation

## 2016-10-28 DIAGNOSIS — J449 Chronic obstructive pulmonary disease, unspecified: Secondary | ICD-10-CM | POA: Diagnosis not present

## 2016-10-28 DIAGNOSIS — K219 Gastro-esophageal reflux disease without esophagitis: Secondary | ICD-10-CM | POA: Diagnosis not present

## 2016-10-28 DIAGNOSIS — F329 Major depressive disorder, single episode, unspecified: Secondary | ICD-10-CM | POA: Diagnosis not present

## 2016-10-28 DIAGNOSIS — C3431 Malignant neoplasm of lower lobe, right bronchus or lung: Secondary | ICD-10-CM | POA: Diagnosis not present

## 2016-10-28 DIAGNOSIS — Z7982 Long term (current) use of aspirin: Secondary | ICD-10-CM | POA: Diagnosis not present

## 2016-10-28 DIAGNOSIS — Z8711 Personal history of peptic ulcer disease: Secondary | ICD-10-CM | POA: Insufficient documentation

## 2016-10-28 NOTE — Progress Notes (Signed)
Patient here today for follow up.  Patient states no new concerns today  

## 2016-10-28 NOTE — Progress Notes (Signed)
[facility] NOTE  Patient Care Team: Mar Daring, PA-C as PCP - General (Family Medicine)  CHIEF COMPLAINTS/PURPOSE OF CONSULTATION:  Lung cancer  Oncology History   # SEP 2016-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 NOV 15th CT NED  # COPD on 02 at night; hx of smoking.      Cancer of lower lobe of right lung ([facility])      HISTORY OF PRESENTING ILLNESS:  Cathy Watts 71 y.o.  female long-standing history of smoking COPD with squamous cell lung cancer stage I currently on definitive radiation is here for follow-up/ review the results of her restaging CAT scan.   Patient complains of weight gain since quitting smoking.  She denies any shortness of breath or chest pain. Denies any unusual cough or hemoptysis.  Admits to chronic shortness of breath. Otherwise denies any headaches or vision changes or any bone pain. Patient stated that she had a mechanical fall few months ago and hit her back. Her back pain is currently improving.   ROS: A complete 10 point review of system is done which is negative except mentioned above in history of present illness  MEDICAL HISTORY:  Past Medical History:  Diagnosis Date  . Anxiety   . Asthma   . Barrett's esophagus   . COPD (chronic obstructive pulmonary disease) ([place])   . Depression   . GERD (gastroesophageal reflux disease)   . History of peptic ulcer disease   . Low oxygen saturation    2l hs  . Osteopenia   . Panic disorder   . Personal history of tobacco use, presenting hazards to health 01/16/2015  . Sleep apnea   . SOB (shortness of breath)   . Squamous cell carcinoma of right lung ([place])     SURGICAL HISTORY: Past Surgical History:  Procedure Laterality Date  . CERVICAL CONE BIOPSY  1990  . CHOLECYSTECTOMY  1980's  . colonoscopy  2014  . ELBOW FRACTURE SURGERY Right 2009   . ENDOBRONCHIAL ULTRASOUND N/A 02/07/2015   Procedure: ENDOBRONCHIAL ULTRASOUND;  Surgeon: Flora Lipps, MD;  Location: [facility] ORS;  Service: Cardiopulmonary;  Laterality: N/A;  . ESOPHAGOGASTRODUODENOSCOPY (EGD) WITH PROPOFOL N/A 02/26/2016   Procedure: ESOPHAGOGASTRODUODENOSCOPY (EGD) WITH PROPOFOL;  Surgeon: Lollie Sails, MD;  Location: [facility] ENDOSCOPY;  Service: Endoscopy;  Laterality: N/A;  COPD, Sleep Apnea  . TONSILLECTOMY  1979  . TUBAL LIGATION  1970's  . UPPER GI ENDOSCOPY      SOCIAL HISTORY: Social History   Social History  . Marital status: Widowed    Spouse name: N/A  . Number of children: N/A  . Years of education: N/A   Occupational History  . Not on file.   Social History Main Topics  . Smoking status: Former Smoker    Packs/day: 1.25    Years: 55.00    Types: Cigarettes    Quit date: 04/12/2016  . Smokeless tobacco: Never Used     Comment: used to use E-cig  . Alcohol use No  . Drug use: No  . Sexual activity: Not on file   Other Topics Concern  . Not on file   Social History Narrative  . No narrative on file    FAMILY HISTORY: Family History  Problem Relation Age of Onset  . Colon cancer Mother        colon  .  Diabetes Mother   . Arthritis Mother   . Glaucoma Mother   . Stomach cancer Father        stomach  . Throat cancer Brother        throat  . Breast cancer Sister        breast  . Diabetes Sister   . Cirrhosis Sister   . Cancer Paternal Aunt   . Parkinson's disease Sister   . Heart attack Sister   . Ovarian cancer Sister     ALLERGIES:  is allergic to amoxicillin.  MEDICATIONS:  Current Outpatient Prescriptions  Medication Sig Dispense Refill  . albuterol (PROAIR HFA) 108 (90 Base) MCG/ACT inhaler Inhale 2 puffs into the lungs every 4 (four) hours as needed for wheezing or shortness of breath. 1 Inhaler 3  . aspirin EC 81 MG tablet Take 81 mg by mouth every other day.    . busPIRone (BUSPAR) 30 MG tablet Take 1 tablet (30 mg  total) by mouth 2 (two) times daily. 60 tablet 11  . CALCIUM CARBONATE-VIT D-MIN PO Take 1 tablet by mouth 2 (two) times daily.     . clotrimazole-betamethasone (LOTRISONE) cream LOTRISONE, 1-0.05% (External Cream)  1 (one) Cream Cream Apply twice daily to affected area.  Not for Internal use.  (Correction from prev script said not for external use and should read , not for internal use) for 0 days  Quantity: 45;  Refills: 1   Ordered :22-Apr-2013  Renaldo Fiddler ;  Started 22-Apr-2013 Active    . escitalopram (LEXAPRO) 20 MG tablet TAKE ONE TABLET BY MOUTH ONCE DAILY 90 tablet 1  . Omega-3 Fatty Acids (FISH OIL) 1200 MG CAPS Take 1 capsule by mouth daily.    Marland Kitchen omeprazole (PRILOSEC) 20 MG capsule TAKE ONE CAPSULE BY MOUTH ONCE DAILY 90 capsule 3  . polyethylene glycol powder (GLYCOLAX/MIRALAX) powder TAKE 17 GRAMS OF POWDER MIXED IN 8 OUNCES OF WATER BY MOUTH ONCE A DAY. 255 g 3  . pravastatin (PRAVACHOL) 20 MG tablet TAKE ONE TABLET BY MOUTH AT BEDTIME 90 tablet 1  . rOPINIRole (REQUIP) 1 MG tablet Take 1-2 tablets (1-2 mg total) by mouth at bedtime. 60 tablet 3   No current facility-administered medications for this visit.       Marland Kitchen  PHYSICAL EXAMINATION: ECOG PERFORMANCE STATUS: 1 - Symptomatic but completely ambulatory  Vitals:   10/28/16 1455  BP: 114/83  Pulse: 87  Resp: 18  Temp: 97.8 F (36.6 C)   Filed Weights   10/28/16 1455  Weight: 210 lb 8 oz (95.5 kg)    GENERAL: Well-nourished well-developed; Alert, no distress and comfortable.   She is alone.Marland Kitchen EYES: no pallor or icterus OROPHARYNX: no thrush or ulceration; good dentition  NECK: supple, no masses felt LYMPH:  no palpable lymphadenopathy in the cervical, axillary or inguinal regions LUNGS: clear to auscultation and  No wheeze or crackles HEART/CVS: regular rate & rhythm and no murmurs; No lower extremity edema ABDOMEN: abdomen soft, non-tender and normal bowel sounds Musculoskeletal:no cyanosis of digits  and no clubbing  PSYCH: alert & oriented x 3 with fluent speech NEURO: no focal motor/sensory deficits SKIN:  no rashes or significant lesions  LABORATORY DATA:  I have reviewed the data as listed Lab Results  Component Value Date   WBC 9.0 09/18/2016   HGB 14.1 09/18/2016   HCT 41.6 09/18/2016   MCV 91.7 09/18/2016   PLT 273 09/18/2016    Recent Labs  03/20/16 0912 05/02/16 1437 09/18/16  1336  NA  --  142 137  K  --  4.5 3.7  CL  --  101 99*  CO2  --  26 30  GLUCOSE  --  100* 105*  BUN  --  15 19  CREATININE 0.80 0.72 0.79  CALCIUM  --  9.6 9.5  GFRNONAA  --  85 >60  GFRAA  --  98 >60  PROT  --  6.5  --   ALBUMIN  --  4.0  --   AST  --  13  --   ALT  --  16  --   ALKPHOS  --  113  --   BILITOT  --  0.3  --     RADIOGRAPHIC STUDIES: I have personally reviewed the radiological images as listed and agreed with the findings in the report. Ct Chest W Contrast  Result Date: 10/21/2016 CLINICAL DATA:  Right lower lobe lung cancer diagnosed in November 2016. EXAM: CT CHEST WITH CONTRAST TECHNIQUE: Multidetector CT imaging of the chest was performed during intravenous contrast administration. CONTRAST:  28mL ISOVUE-300 IOPAMIDOL (ISOVUE-300) INJECTION 61% COMPARISON:  Chest CT 10/05/2015 and 03/20/2016. FINDINGS: Cardiovascular: Stable atherosclerosis, primarily involving the thoracic aorta. No acute vascular findings are seen. The heart size is normal. There is no pericardial effusion. Mediastinum/Nodes: There are no enlarged mediastinal, hilar or axillary lymph nodes.Small mediastinal and right hilar lymph nodes are stable. The thyroid gland, trachea and esophagus demonstrate no significant findings. Lungs/Pleura: There is no pleural effusion. There are stable radiation changes in the right perihilar region with associated asymmetric central airway thickening. Moderate emphysema, scattered scarring and subpleural nodularity are stable. Largest nodule in the right upper lobe  measures 4 mm on image 33, unchanged. Upper abdomen: The visualized upper abdomen appears unchanged, without suspicious findings. There is a probable cyst in the left hepatic lobe on image 125, mild thickening of the adrenal glands and postsurgical changes from previous cholecystectomy. Musculoskeletal/Chest wall: Interval development of a superior endplate compression fracture at T7 resulting in approximately 25% loss of vertebral body height. There is associated sclerosis within the vertebral body, but no evidence of lytic lesion, osseous retropulsion or epidural tumor. IMPRESSION: 1. Stable post radiation changes in the right lung. No evidence of local recurrence or metastatic disease. 2. Interval development of T7 compression deformity which demonstrates no pathologic features. 3. Aortic Atherosclerosis (ICD10-I70.0) and Emphysema (ICD10-J43.9). Electronically Signed   By: Richardean Sale M.D.   On: 10/21/2016 12:10    ASSESSMENT & PLAN:   Cancer of lower lobe of right lung ([place]) # Squamous cell carcinoma of the lung stage I [ cT1aN0]; S/p definitive RT [2016]; June 2018-NED. Discussed that I would recommend surveillance CT scans every 6 months or so for the first 2-3 years; and then on an annual basis for 4-5 years.  # right lower lobe- radiation changes stable/asymptomatic.   # T7 compression fracture likely traumatic. Fairly asymptomatic. Monitor for now.  #  Patient follow-up with [place] in approximately 6 months; scan prior.    # I reviewed the images myself and with the patient and family in detail.   # 25 minutes face-to-face with the patient discussing the above plan of care; more than 50% of time spent on prognosis/ natural history; counseling and coordination.        Cammie Sickle, MD 10/28/2016 8:04 PM

## 2016-10-28 NOTE — Assessment & Plan Note (Addendum)
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 Discussed that I would recommend surveillance CT scans every 6 months or so for the first 2-3 years; and then on an annual basis for 4-5 years.  # right lower lobe- radiation changes stable/asymptomatic.   # T7 compression fracture likely traumatic. Fairly asymptomatic. Monitor for now.  #  Patient follow-up with [place] in approximately 6 months; scan prior.    # I reviewed the images myself and with the patient and family in detail.   # 25 minutes face-to-face with the patient discussing the above plan of care; more than 50% of time spent on prognosis/ natural history; counseling and coordination.

## 2016-11-06 ENCOUNTER — Other Ambulatory Visit: Payer: Self-pay | Admitting: Physician Assistant

## 2016-11-06 DIAGNOSIS — F419 Anxiety disorder, unspecified: Secondary | ICD-10-CM

## 2016-11-19 ENCOUNTER — Other Ambulatory Visit: Payer: Self-pay | Admitting: Physician Assistant

## 2016-11-19 DIAGNOSIS — G2581 Restless legs syndrome: Secondary | ICD-10-CM

## 2016-12-20 ENCOUNTER — Encounter: Payer: Self-pay | Admitting: Physician Assistant

## 2017-01-03 ENCOUNTER — Telehealth: Payer: Self-pay | Admitting: Physician Assistant

## 2017-01-03 MED ORDER — VARENICLINE TARTRATE 0.5 MG X 11 & 1 MG X 42 PO MISC: ORAL | 0 refills | Status: DC

## 2017-01-03 NOTE — Telephone Encounter (Signed)
pt would like to get another starter kit of the chantix for smoking   She uses [facility] [address]  Her call back is [phone]  Thanks [employer]

## 2017-01-03 NOTE — Telephone Encounter (Signed)
Med sent.

## 2017-01-03 NOTE — Telephone Encounter (Signed)
Patient advised as directed below.  Thanks,  -Kierrah Kilbride 

## 2017-01-23 ENCOUNTER — Encounter: Payer: Self-pay | Admitting: Physician Assistant

## 2017-02-04 ENCOUNTER — Other Ambulatory Visit: Payer: Self-pay | Admitting: Physician Assistant

## 2017-02-04 DIAGNOSIS — E7800 Pure hypercholesterolemia, unspecified: Secondary | ICD-10-CM

## 2017-02-05 ENCOUNTER — Other Ambulatory Visit: Payer: Self-pay | Admitting: Physician Assistant

## 2017-02-05 DIAGNOSIS — Z716 Tobacco abuse counseling: Secondary | ICD-10-CM

## 2017-02-05 MED ORDER — VARENICLINE TARTRATE 0.5 MG X 11 & 1 MG X 42 PO MISC: ORAL | 0 refills | Status: DC

## 2017-02-05 NOTE — Telephone Encounter (Signed)
[facility] faxed refill request for the following medications:  varenicline (CHANTIX STARTING MONTH PAK) 0.5 MG X 11 & 1 MG X 42 tablet   Last Rx: 01/03/17 LOV: 04/24/16 Please advise. Thanks TNP

## 2017-02-05 NOTE — Telephone Encounter (Signed)
Chantix starting pack sent to [address]

## 2017-02-10 ENCOUNTER — Other Ambulatory Visit: Payer: Self-pay | Admitting: Physician Assistant

## 2017-02-10 DIAGNOSIS — G2581 Restless legs syndrome: Secondary | ICD-10-CM

## 2017-02-11 ENCOUNTER — Other Ambulatory Visit: Payer: Self-pay | Admitting: Internal Medicine

## 2017-03-31 ENCOUNTER — Ambulatory Visit: Payer: Medicare Other | Admitting: Internal Medicine

## 2017-03-31 ENCOUNTER — Encounter: Payer: Self-pay | Admitting: Internal Medicine

## 2017-03-31 VITALS — BP 120/80 | HR 96 | Ht 70.0 in | Wt 211.0 lb

## 2017-03-31 DIAGNOSIS — J449 Chronic obstructive pulmonary disease, unspecified: Secondary | ICD-10-CM | POA: Diagnosis not present

## 2017-03-31 MED ORDER — ALBUTEROL SULFATE HFA 108 (90 BASE) MCG/ACT IN AERS: 2.0000 | INHALATION_SPRAY | RESPIRATORY_TRACT | 2 refills | Status: DC | PRN

## 2017-03-31 NOTE — Addendum Note (Signed)
Addended by: Oscar La R on: 03/31/2017 02:50 PM   Modules accepted: Orders

## 2017-03-31 NOTE — Progress Notes (Signed)
   Subjective:    Patient ID: Cathy Watts, female    DOB: [birth year], 71 y.o.   MRN: [id number]  Synopsis: Gold Grade B with moderate airflow obstruction 2015, still smoking as of 05/2015 Recent DX of SQ cell Lung cancer  CC: Follow up COPD HPI  Patient quit smoking  3 weeks ago chantix completed Her wheezing and chest congestion and cough have been intermittent uses proair as needed CT and PET scan shows no residual cancer at previous visit  No signs of infection, no acute resp issues  Review of Systems  Constitutional: Negative for chills, fatigue and fever.  HENT: Negative for congestion.   Respiratory: Positive for cough. Negative for choking, chest tightness, shortness of breath and wheezing.   Cardiovascular: Negative for chest pain and leg swelling.  Gastrointestinal: Negative.   All other systems reviewed and are negative.  BP 120/80 (BP Location: Left Arm, Cuff Size: Normal)   Pulse 96   Ht 5\' 10"  (1.778 m)   Wt 211 lb (95.7 kg)   SpO2 93%   BMI 30.28 kg/m      Objective:   Physical Exam  Constitutional: She is oriented to person, place, and time. She appears well-developed and well-nourished. No distress.  HENT:  Head: Normocephalic and atraumatic.  Eyes: EOM are normal. Pupils are equal, round, and reactive to light.  Neck: Normal range of motion.  Cardiovascular: Normal rate, regular rhythm and normal heart sounds.  Pulmonary/Chest: Effort normal. No stridor. No respiratory distress. She has no wheezes. She has no rales.  Abdominal: Soft.  Neurological: She is alert and oriented to person, place, and time.  Skin: She is not diaphoretic.         Assessment & Plan:    71 yo white female SEP 2016- SQUAMOUS CELL LUNG CA STAGE IA with COPD  Gold Stage A with chronic hypoxic resp failure  Continue SMOKING CESSATION   1.hold BREO and Incruse as per patient request, use albuterol as needed 2.continue oxygen therapy at night with 2 L Cantril 3.follow up  oncology as scheduled   Follow up in 6  Months  Patient satisfied with Plan of action and management. All questions answered  Corrin Parker, M.D.  [facility]  Medical Director [place] Director [facility] Cardio-Pulmonary Department

## 2017-03-31 NOTE — Patient Instructions (Signed)
CONGRATULATIONS!!! You did it!!! Continue Pro air as needed

## 2017-04-16 ENCOUNTER — Ambulatory Visit
Admission: RE | Admit: 2017-04-16 | Discharge: 2017-04-16 | Disposition: A | Discharge status: Home / Self Care | Payer: Medicare Other | Source: Ambulatory Visit | Attending: Internal Medicine | Admitting: Internal Medicine

## 2017-04-16 DIAGNOSIS — R918 Other nonspecific abnormal finding of lung field: Secondary | ICD-10-CM | POA: Diagnosis not present

## 2017-04-16 DIAGNOSIS — Y842 Radiological procedure and radiotherapy as the cause of abnormal reaction of the patient, or of later complication, without mention of misadventure at the time of the procedure: Secondary | ICD-10-CM | POA: Insufficient documentation

## 2017-04-16 DIAGNOSIS — C3431 Malignant neoplasm of lower lobe, right bronchus or lung: Secondary | ICD-10-CM | POA: Diagnosis not present

## 2017-04-16 DIAGNOSIS — J439 Emphysema, unspecified: Secondary | ICD-10-CM | POA: Insufficient documentation

## 2017-04-16 DIAGNOSIS — I700 Atherosclerosis of aorta: Secondary | ICD-10-CM | POA: Insufficient documentation

## 2017-04-16 LAB — POCT I-STAT CREATININE: Creatinine, Ser: 0.7 mg/dL (ref 0.44–1.00)

## 2017-04-16 MED ORDER — IOPAMIDOL (ISOVUE-300) INJECTION 61%
75.0000 mL | Freq: Once | INTRAVENOUS | Status: AC | PRN
  Administered 2017-04-16: 75 mL via INTRAVENOUS

## 2017-04-17 ENCOUNTER — Telehealth: Payer: Self-pay | Admitting: Physician Assistant

## 2017-04-22 ENCOUNTER — Other Ambulatory Visit: Payer: Self-pay | Admitting: Internal Medicine

## 2017-04-22 ENCOUNTER — Ambulatory Visit: Payer: Medicare Other

## 2017-04-22 DIAGNOSIS — C3431 Malignant neoplasm of lower lobe, right bronchus or lung: Secondary | ICD-10-CM

## 2017-04-23 ENCOUNTER — Inpatient Hospital Stay: Payer: Medicare Other | Attending: Internal Medicine

## 2017-04-23 ENCOUNTER — Inpatient Hospital Stay (HOSPITAL_BASED_OUTPATIENT_CLINIC_OR_DEPARTMENT_OTHER): Payer: Medicare Other | Admitting: Internal Medicine

## 2017-04-23 ENCOUNTER — Other Ambulatory Visit: Payer: Self-pay

## 2017-04-23 VITALS — BP 138/92 | HR 92 | Temp 97.2°F | Resp 20 | Ht 70.0 in | Wt 213.0 lb

## 2017-04-23 DIAGNOSIS — C3431 Malignant neoplasm of lower lobe, right bronchus or lung: Secondary | ICD-10-CM | POA: Insufficient documentation

## 2017-04-23 DIAGNOSIS — Z923 Personal history of irradiation: Secondary | ICD-10-CM | POA: Diagnosis not present

## 2017-04-23 DIAGNOSIS — I2510 Atherosclerotic heart disease of native coronary artery without angina pectoris: Secondary | ICD-10-CM

## 2017-04-23 DIAGNOSIS — Z8041 Family history of malignant neoplasm of ovary: Secondary | ICD-10-CM | POA: Insufficient documentation

## 2017-04-23 DIAGNOSIS — Z8711 Personal history of peptic ulcer disease: Secondary | ICD-10-CM

## 2017-04-23 DIAGNOSIS — J449 Chronic obstructive pulmonary disease, unspecified: Secondary | ICD-10-CM

## 2017-04-23 DIAGNOSIS — M8580 Other specified disorders of bone density and structure, unspecified site: Secondary | ICD-10-CM | POA: Insufficient documentation

## 2017-04-23 DIAGNOSIS — Z803 Family history of malignant neoplasm of breast: Secondary | ICD-10-CM | POA: Insufficient documentation

## 2017-04-23 DIAGNOSIS — Z9981 Dependence on supplemental oxygen: Secondary | ICD-10-CM | POA: Diagnosis not present

## 2017-04-23 DIAGNOSIS — Z800 Family history of malignant neoplasm of digestive organs: Secondary | ICD-10-CM

## 2017-04-23 DIAGNOSIS — Z87891 Personal history of nicotine dependence: Secondary | ICD-10-CM | POA: Insufficient documentation

## 2017-04-23 DIAGNOSIS — Z87311 Personal history of (healed) other pathological fracture: Secondary | ICD-10-CM

## 2017-04-23 DIAGNOSIS — Y842 Radiological procedure and radiotherapy as the cause of abnormal reaction of the patient, or of later complication, without mention of misadventure at the time of the procedure: Secondary | ICD-10-CM | POA: Diagnosis not present

## 2017-04-23 DIAGNOSIS — Z7982 Long term (current) use of aspirin: Secondary | ICD-10-CM | POA: Diagnosis not present

## 2017-04-23 DIAGNOSIS — F410 Panic disorder [episodic paroxysmal anxiety] without agoraphobia: Secondary | ICD-10-CM

## 2017-04-23 DIAGNOSIS — Z79899 Other long term (current) drug therapy: Secondary | ICD-10-CM | POA: Diagnosis not present

## 2017-04-23 DIAGNOSIS — K219 Gastro-esophageal reflux disease without esophagitis: Secondary | ICD-10-CM | POA: Insufficient documentation

## 2017-04-23 DIAGNOSIS — Z1239 Encounter for other screening for malignant neoplasm of breast: Secondary | ICD-10-CM

## 2017-04-23 LAB — COMPREHENSIVE METABOLIC PANEL
ALBUMIN: 4.3 g/dL (ref 3.5–5.0)
ALT: 20 U/L (ref 14–54)
AST: 19 U/L (ref 15–41)
Alkaline Phosphatase: 56 U/L (ref 38–126)
Anion gap: 8 (ref 5–15)
BUN: 22 mg/dL — AB (ref 6–20)
CO2: 29 mmol/L (ref 22–32)
Calcium: 9.6 mg/dL (ref 8.9–10.3)
Chloride: 98 mmol/L — ABNORMAL LOW (ref 101–111)
Creatinine, Ser: 0.81 mg/dL (ref 0.44–1.00)
GFR calc Af Amer: >60 mL/min (ref >60)
GFR calc non Af Amer: >60 mL/min (ref >60)
GLUCOSE: 99 mg/dL (ref 65–99)
POTASSIUM: 4.3 mmol/L (ref 3.5–5.1)
Sodium: 135 mmol/L (ref 135–145)
Total Bilirubin: 0.6 mg/dL (ref 0.3–1.2)
Total Protein: 7.2 g/dL (ref 6.5–8.1)

## 2017-04-23 LAB — CBC WITH DIFFERENTIAL/PLATELET
Basophils Absolute: 0 10*3/uL (ref 0–0.1)
Basophils Relative: 1 %
EOS PCT: 2 %
Eosinophils Absolute: 0.2 10*3/uL (ref 0–0.7)
HEMATOCRIT: 41.4 % (ref 35.0–47.0)
Hemoglobin: 13.9 g/dL (ref 12.0–16.0)
LYMPHS ABS: 1.4 10*3/uL (ref 1.0–3.6)
LYMPHS PCT: 19 %
MCH: 30.6 pg (ref 26.0–34.0)
MCHC: 33.5 g/dL (ref 32.0–36.0)
MCV: 91.2 fL (ref 80.0–100.0)
MONO ABS: 0.8 10*3/uL (ref 0.2–0.9)
Monocytes Relative: 12 %
NEUTROS ABS: 4.8 10*3/uL (ref 1.4–6.5)
Neutrophils Relative %: 66 %
PLATELETS: 234 10*3/uL (ref 150–440)
RBC: 4.54 MIL/uL (ref 3.80–5.20)
RDW: 14.5 % (ref 11.5–14.5)
WBC: 7.2 10*3/uL (ref 3.6–11.0)

## 2017-04-23 NOTE — Assessment & Plan Note (Addendum)
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   # Discussed that I would recommend surveillance CT scans every 6 months or so for the first 2-3 years; and then on an annual basis for 4-5 years.  # right lower lobe- radiation changes stable/asymptomatic.   # T7 compression fracture likely traumatic. Fairly asymptomatic. Monitor for now.  #  Patient follow-up with [place] in approximately 6 months; scan prior.    # I reviewed the images myself and with the patient   # 25 minutes face-to-face with the patient discussing the above plan of care; more than 50% of time spent on prognosis/ natural history; counseling and coordination.

## 2017-04-23 NOTE — Progress Notes (Signed)
[facility] NOTE  Patient Care Team: Mar Daring, [name], MD as Consulting Physician (Ophthalmology)  CHIEF COMPLAINTS/PURPOSE OF CONSULTATION:  Lung cancer  Oncology History   # SEP 2016-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 NOV 15th CT NED  # COPD on 02 at night; hx of smoking.      Cancer of lower lobe of right lung (HCC)      HISTORY OF PRESENTING ILLNESS:  Cathy Watts 71 y.o.  female long-standing history of smoking COPD with squamous cell lung cancer stage I currently on definitive radiation is here for follow-up/ review the results of her restaging CAT scan.   Denies any unusual cough or hemoptysis.  Admits to chronic shortness of breath. Otherwise denies any headaches or vision changes or any bone pain.   ROS: A complete 10 point review of system is done which is negative except mentioned above in history of present illness  MEDICAL HISTORY:  Past Medical History:  Diagnosis Date  . Anxiety   . Asthma   . Barrett's esophagus   . COPD (chronic obstructive pulmonary disease) ([place])   . Depression   . GERD (gastroesophageal reflux disease)   . History of peptic ulcer disease   . Low oxygen saturation    2l hs  . Osteopenia   . Panic disorder   . Personal history of tobacco use, presenting hazards to health 01/16/2015  . Sleep apnea   . SOB (shortness of breath)   . Squamous cell carcinoma of right lung ([place])     SURGICAL HISTORY: Past Surgical History:  Procedure Laterality Date  . CERVICAL CONE BIOPSY  1990  . CHOLECYSTECTOMY  1980's  . colonoscopy  2014  . ELBOW FRACTURE SURGERY Right 2009  . ENDOBRONCHIAL ULTRASOUND N/A 02/07/2015   Procedure: ENDOBRONCHIAL ULTRASOUND;  Surgeon: Flora Lipps, MD;  Location: [facility] ORS;  Service: Cardiopulmonary;   Laterality: N/A;  . ESOPHAGOGASTRODUODENOSCOPY (EGD) WITH PROPOFOL N/A 02/26/2016   Procedure: ESOPHAGOGASTRODUODENOSCOPY (EGD) WITH PROPOFOL;  Surgeon: Lollie Sails, MD;  Location: [facility] ENDOSCOPY;  Service: Endoscopy;  Laterality: N/A;  COPD, Sleep Apnea  . TONSILLECTOMY  1979  . TUBAL LIGATION  1970's  . UPPER GI ENDOSCOPY      SOCIAL HISTORY: Social History   Socioeconomic History  . Marital status: Significant Other    Spouse name: Not on file  . Number of children: 3  . Years of education: Not on file  . Highest education level: 12th grade  Social Needs  . Financial resource strain: Not hard at all  . Food insecurity - worry: Never true  . Food insecurity - inability: Never true  . Transportation needs - medical: No  . Transportation needs - non-medical: No  Occupational History  . Occupation: retired  Tobacco Use  . Smoking status: Former Smoker    Packs/day: 1.25    Years: 55.00    Pack years: 68.75    Types: Cigarettes  . Smokeless tobacco: Never Used  . Tobacco comment: used to use E-cig  Substance and Sexual Activity  . Alcohol use: Yes    Alcohol/week: 0.0 oz    Comment: occasionally- wine  . Drug use: No  . Sexual activity: Not on file  Other Topics Concern  . Not on file  Social History Narrative  . Not  on file    FAMILY HISTORY: Family History  Problem Relation Age of Onset  . Colon cancer Mother        colon  . Diabetes Mother   . Arthritis Mother   . Glaucoma Mother   . Stomach cancer Father        stomach  . Throat cancer Brother        throat  . Breast cancer Sister        breast  . Diabetes Sister   . Cirrhosis Sister   . Cancer Paternal Aunt   . Parkinson's disease Sister   . Heart attack Sister   . Ovarian cancer Sister     ALLERGIES:  is allergic to amoxicillin.  MEDICATIONS:  Current Outpatient Medications  Medication Sig Dispense Refill  . albuterol (PROAIR HFA) 108 (90 Base) MCG/ACT inhaler Inhale 2 puffs into the  lungs every 4 (four) hours as needed for wheezing or shortness of breath. 1 each 2  . aspirin EC 81 MG tablet Take 81 mg by mouth every other day.    . busPIRone (BUSPAR) 30 MG tablet Take 1 tablet (30 mg total) by mouth 2 (two) times daily. 60 tablet 11  . CALCIUM CARBONATE-VIT D-MIN PO Take 1 tablet by mouth 2 (two) times daily.     . clotrimazole-betamethasone (LOTRISONE) cream LOTRISONE, 1-0.05% (External Cream)  1 (one) Cream Cream Apply twice daily to affected area.  Not for Internal use.  (Correction from prev script said not for external use and should read , not for internal use) for 0 days  Quantity: 45;  Refills: 1   Ordered :22-Apr-2013  Renaldo Fiddler ;  Started 22-Apr-2013 Active    . escitalopram (LEXAPRO) 20 MG tablet TAKE ONE TABLET BY MOUTH ONCE DAILY 90 tablet 1  . Omega-3 Fatty Acids (FISH OIL) 1200 MG CAPS Take 1 capsule by mouth daily.    Marland Kitchen omeprazole (PRILOSEC) 20 MG capsule TAKE ONE CAPSULE BY MOUTH ONCE DAILY 90 capsule 3  . pravastatin (PRAVACHOL) 20 MG tablet TAKE 1 TABLET BY MOUTH AT BEDTIME 90 tablet 1  . NON FORMULARY     . polyethylene glycol powder (GLYCOLAX/MIRALAX) powder TAKE 17 GRAMS OF POWDER MIXED IN 8 OUNCES OF WATER BY MOUTH ONCE A DAY. 255 g 3  . rOPINIRole (REQUIP) 3 MG tablet Take 1 tablet (3 mg total) by mouth at bedtime. 90 tablet 1   No current facility-administered medications for this visit.       Marland Kitchen  PHYSICAL EXAMINATION: ECOG PERFORMANCE STATUS: 1 - Symptomatic but completely ambulatory  Vitals:   04/23/17 1444  BP: (!) 138/92  Pulse: 92  Resp: 20  Temp: (!) 97.2 F (36.2 C)   Filed Weights   04/23/17 1444  Weight: 213 lb (96.6 kg)    GENERAL: Well-nourished well-developed; Alert, no distress and comfortable.   She is alone.Marland Kitchen EYES: no pallor or icterus OROPHARYNX: no thrush or ulceration; good dentition  NECK: supple, no masses felt LYMPH:  no palpable lymphadenopathy in the cervical, axillary or inguinal regions LUNGS:  clear to auscultation and  No wheeze or crackles HEART/CVS: regular rate & rhythm and no murmurs; No lower extremity edema ABDOMEN: abdomen soft, non-tender and normal bowel sounds Musculoskeletal:no cyanosis of digits and no clubbing  PSYCH: alert & oriented x 3 with fluent speech NEURO: no focal motor/sensory deficits SKIN:  no rashes or significant lesions  LABORATORY DATA:  I have reviewed the data as listed Lab Results  Component Value  Date   WBC 7.2 04/23/2017   HGB 13.9 04/23/2017   HCT 41.4 04/23/2017   MCV 91.2 04/23/2017   PLT 234 04/23/2017   Recent Labs    05/02/16 1437 09/18/16 1336 04/16/17 1259 04/23/17 1358  NA 142 137  --  135  K 4.5 3.7  --  4.3  CL 101 99*  --  98*  CO2 26 30  --  29  GLUCOSE 100* 105*  --  99  BUN 15 19  --  22*  CREATININE 0.72 0.79 0.70 0.81  CALCIUM 9.6 9.5  --  9.6  GFRNONAA 85 >60  --  >60  GFRAA 98 >60  --  >60  PROT 6.5  --   --  7.2  ALBUMIN 4.0  --   --  4.3  AST 13  --   --  19  ALT 16  --   --  20  ALKPHOS 113  --   --  56  BILITOT 0.3  --   --  0.6    RADIOGRAPHIC STUDIES: I have personally reviewed the radiological images as listed and agreed with the findings in the report. Ct Chest W Contrast  Result Date: 04/16/2017 CLINICAL DATA:  Restaging right lower lobe lung cancer postradiation therapy. No previous relevant surgery or current symptoms. EXAM: CT CHEST WITH CONTRAST TECHNIQUE: Multidetector CT imaging of the chest was performed during intravenous contrast administration. CONTRAST:  72mL ISOVUE-300 IOPAMIDOL (ISOVUE-300) INJECTION 61% COMPARISON:  Chest CT 10/21/2016 and 03/20/2016. FINDINGS: Cardiovascular: Similar atherosclerosis of the aorta, great vessels and coronary arteries. No acute vascular findings. The heart size is normal. There is no pericardial effusion. Mediastinum/Nodes: There are no enlarged mediastinal, hilar or axillary lymph nodes. The thyroid gland, trachea and esophagus demonstrate no  significant findings. Lungs/Pleura: There is no pleural effusion. Radiation changes in the right perihilar region are similar with central airway thickening, hilar distortion and surrounding scarring. No recurrent mass lesion identified. Moderate emphysema and scattered subpleural nodularity appear unchanged. No enlarging nodule seen. Upper abdomen: Stable appearance of the visualized upper abdomen. Although cyst in the left hepatic lobe again noted, unchanged (image 136). Musculoskeletal/Chest wall: There is no chest wall mass or suspicious osseous finding. Stable T7 compression fracture. IMPRESSION: 1. Similar radiation changes in the right perihilar region. No evidence of local recurrence or metastatic disease. 2. Stable scattered pulmonary nodularity. 3. Aortic Atherosclerosis (ICD10-I70.0) and Emphysema (ICD10-J43.9). Electronically Signed   By: Richardean Sale M.D.   On: 04/16/2017 15:27   IMPRESSION: 1. Similar radiation changes in the right perihilar region. No evidence of local recurrence or metastatic disease. 2. Stable scattered pulmonary nodularity. 3. Aortic Atherosclerosis (ICD10-I70.0) and Emphysema (ICD10-J43.9).   Electronically Signed   By: Richardean Sale M.D.   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   # Discussed that I would recommend surveillance CT scans every 6 months or so for the first 2-3 years; and then on an annual basis for 4-5 years.  # right lower lobe- radiation changes stable/asymptomatic.   # T7 compression fracture likely traumatic. Fairly asymptomatic. Monitor for now.  #  Patient follow-up with [place] in approximately 6 months; scan prior.    # I reviewed the images myself and with the patient   # 25 minutes face-to-face with the patient discussing the above plan of care;  more than 50% of time spent on prognosis/  natural history; counseling and coordination.        Cammie Sickle, MD 05/02/2017 8:49 AM

## 2017-04-24 ENCOUNTER — Ambulatory Visit (INDEPENDENT_AMBULATORY_CARE_PROVIDER_SITE_OTHER): Payer: Medicare Other

## 2017-04-24 VITALS — BP 134/84 | HR 76 | Temp 97.6°F | Ht 70.0 in | Wt 214.4 lb

## 2017-04-24 DIAGNOSIS — Z0000 Encounter for general adult medical examination without abnormal findings: Secondary | ICD-10-CM

## 2017-04-24 DIAGNOSIS — Z23 Encounter for immunization: Secondary | ICD-10-CM | POA: Diagnosis not present

## 2017-04-24 NOTE — Progress Notes (Signed)
Subjective:   Cathy Watts is a 71 y.o. female who presents for Medicare Annual (Subsequent) preventive examination.  Review of Systems:  N/A  Cardiac Risk Factors include: advanced age (>70men, >19 women);dyslipidemia     Objective:     Vitals: BP 134/84 (BP Location: Left Arm)   Pulse 76   Temp 97.6 F (36.4 C) (Oral)   Ht 5\' 10"  (1.778 m)   Wt 214 lb 6.4 oz (97.3 kg)   BMI 30.76 kg/m   Body mass index is 30.76 kg/m.  Advanced Directives 04/24/2017 04/23/2017 10/28/2016 09/18/2016 05/13/2016 04/22/2016 04/08/2016  Does Patient Have a Medical Advance Directive? Yes No No No No No No  Type of Paramedic of [place];Living will - - - - - -  Copy of [place] in Chart? No - copy requested - - - - - -  Would patient like information on creating a medical advance directive? - No - Patient declined No - Patient declined - No - Patient declined No - Patient declined -    Tobacco Social History   Tobacco Use  Smoking Status Former Smoker  . Packs/day: 1.25  . Years: 55.00  . Pack years: 68.75  . Types: Cigarettes  Smokeless Tobacco Never Used  Tobacco Comment   used to use E-cig     Counseling given: Not Answered Comment: used to use E-cig   Clinical Intake:  Pre-visit preparation completed: Yes  Pain : No/denies pain Pain Score: 0-No pain     Nutritional Status: BMI > 30  Obese Diabetes: No  How often do you need to have someone help you when you read instructions, pamphlets, or other written materials from your doctor or pharmacy?: 1 - Never  Interpreter Needed?: No  Information entered by :: [facility], LPN  Past Medical History:  Diagnosis Date  . Anxiety   . Asthma   . Barrett's esophagus   . COPD (chronic obstructive pulmonary disease) ([place])   . Depression   . GERD (gastroesophageal reflux disease)   . History of peptic ulcer disease   . Low oxygen saturation    2l hs  . Osteopenia   . Panic  disorder   . Personal history of tobacco use, presenting hazards to health 01/16/2015  . Sleep apnea   . SOB (shortness of breath)   . Squamous cell carcinoma of right lung [facility])    Past Surgical History:  Procedure Laterality Date  . CERVICAL CONE BIOPSY  1990  . CHOLECYSTECTOMY  1980's  . colonoscopy  2014  . ELBOW FRACTURE SURGERY Right 2009  . ENDOBRONCHIAL ULTRASOUND N/A 02/07/2015   Procedure: ENDOBRONCHIAL ULTRASOUND;  Surgeon: Flora Lipps, MD;  Location: [facility] ORS;  Service: Cardiopulmonary;  Laterality: N/A;  . ESOPHAGOGASTRODUODENOSCOPY (EGD) WITH PROPOFOL N/A 02/26/2016   Procedure: ESOPHAGOGASTRODUODENOSCOPY (EGD) WITH PROPOFOL;  Surgeon: Lollie Sails, MD;  Location: [facility] ENDOSCOPY;  Service: Endoscopy;  Laterality: N/A;  COPD, Sleep Apnea  . TONSILLECTOMY  1979  . TUBAL LIGATION  1970's  . UPPER GI ENDOSCOPY     Family History  Problem Relation Age of Onset  . Colon cancer Mother        colon  . Diabetes Mother   . Arthritis Mother   . Glaucoma Mother   . Stomach cancer Father        stomach  . Throat cancer Brother        throat  . Breast cancer Sister  breast  . Diabetes Sister   . Cirrhosis Sister   . Cancer Paternal Aunt   . Parkinson's disease Sister   . Heart attack Sister   . Ovarian cancer Sister    Social History   Socioeconomic History  . Marital status: Significant Other    Spouse name: None  . Number of children: 3  . Years of education: None  . Highest education level: 12th grade  Social Needs  . Financial resource strain: Not hard at all  . Food insecurity - worry: Never true  . Food insecurity - inability: Never true  . Transportation needs - medical: No  . Transportation needs - non-medical: No  Occupational History  . Occupation: retired  Tobacco Use  . Smoking status: Former Smoker    Packs/day: 1.25    Years: 55.00    Pack years: 68.75    Types: Cigarettes  . Smokeless tobacco: Never Used  . Tobacco comment: used  to use E-cig  Substance and Sexual Activity  . Alcohol use: Yes    Alcohol/week: 0.0 oz    Comment: occasionally- wine  . Drug use: No  . Sexual activity: None  Other Topics Concern  . None  Social History Narrative  . None    Outpatient Encounter Medications as of 04/24/2017  Medication Sig  . albuterol (PROAIR HFA) 108 (90 Base) MCG/ACT inhaler Inhale 2 puffs into the lungs every 4 (four) hours as needed for wheezing or shortness of breath.  Marland Kitchen aspirin EC 81 MG tablet Take 81 mg by mouth every other day.  . busPIRone (BUSPAR) 30 MG tablet Take 1 tablet (30 mg total) by mouth 2 (two) times daily.  Marland Kitchen CALCIUM CARBONATE-VIT D-MIN PO Take 1 tablet by mouth 2 (two) times daily.   . clotrimazole-betamethasone (LOTRISONE) cream LOTRISONE, 1-0.05% (External Cream)  1 (one) Cream Cream Apply twice daily to affected area.  Not for Internal use.  (Correction from prev script said not for external use and should read , not for internal use) for 0 days  Quantity: 45;  Refills: 1   Ordered :22-Apr-2013  Renaldo Fiddler ;  Started 22-Apr-2013 Active  . escitalopram (LEXAPRO) 20 MG tablet TAKE ONE TABLET BY MOUTH ONCE DAILY  . NON FORMULARY   . Omega-3 Fatty Acids (FISH OIL) 1200 MG CAPS Take 1 capsule by mouth daily.  Marland Kitchen omeprazole (PRILOSEC) 20 MG capsule TAKE ONE CAPSULE BY MOUTH ONCE DAILY  . polyethylene glycol powder (GLYCOLAX/MIRALAX) powder TAKE 17 GRAMS OF POWDER MIXED IN 8 OUNCES OF WATER BY MOUTH ONCE A DAY.  . pravastatin (PRAVACHOL) 20 MG tablet TAKE 1 TABLET BY MOUTH AT BEDTIME  . rOPINIRole (REQUIP) 1 MG tablet TAKE 1 TO 2 TABLETS BY MOUTH AT BEDTIME   No facility-administered encounter medications on file as of 04/24/2017.     Activities of Daily Living In your present state of health, do you have any difficulty performing the following activities: 04/24/2017 04/24/2016  Hearing? N N  Vision? N N  Comment wears glasses -  Difficulty concentrating or making decisions? N N    Walking or climbing stairs? N N  Dressing or bathing? N N  Doing errands, shopping? N N  Preparing Food and eating ? N -  Using the Toilet? N -  In the past six months, have you accidently leaked urine? Y -  Comment occasionally, does not wear protection -  Do you have problems with loss of bowel control? N -  Managing your Medications? N -  Managing your Finances? N -  Housekeeping or managing your Housekeeping? N -  Some recent data might be hidden    Patient Care Team: Mar Daring, [name], MD as Consulting Physician (Ophthalmology)    Assessment:   This is a routine wellness examination for [place].  Exercise Activities and Dietary recommendations Current Exercise Habits: The patient does not participate in regular exercise at present, Exercise limited by: None identified  Goals    . DIET - EAT MORE FRUITS AND VEGETABLES     Recommend eating 2 servings of vegetables and fruits a day. Substitute for junk food.        Fall Risk Fall Risk  04/24/2017 05/13/2016 05/13/2016 04/24/2016 10/09/2015  Falls in the past year? No No No No No   Is the patient's home free of loose throw rugs in walkways, pet beds, electrical cords, etc?   yes      Grab bars in the bathroom? yes      Handrails on the stairs?   yes      Adequate lighting?   yes  Timed Get Up and Go performed: N/A  Depression Screen PHQ 2/9 Scores 04/24/2017 05/13/2016 04/24/2016 10/09/2015  PHQ - 2 Score 1 0 0 0     Cognitive Function: Pt declined screening today.         Immunization History  Administered Date(s) Administered  . Influenza Split 04/11/2011, 04/15/2012, 04/02/2013  . Influenza, High Dose Seasonal PF 04/24/2016, 04/24/2017  . Influenza, Seasonal, Injecte, Preservative Fre 04/25/2015  . Influenza,inj,Quad PF,6+ Mos 04/20/2013  . Influenza-Unspecified 02/08/2014  . Pneumococcal Conjugate-13 04/22/2014  . Pneumococcal Polysaccharide-23  04/20/2013  . Pneumococcal-Unspecified 04/02/2013, 04/10/2014  . Tdap 11/24/2009  . Zoster 07/19/2013    Qualifies for Shingles Vaccine? Due for Shingles vaccine. Declined my offer to administer today. Education has been provided regarding the importance of this vaccine. Pt has been advised to call her insurance company to determine her out of pocket expense. Advised she may also receive this vaccine at her local pharmacy or Health Dept. Verbalized acceptance and understanding.  Screening Tests Health Maintenance  Topic Date Due  . MAMMOGRAM  06/02/2016  . COLONOSCOPY  07/21/2017  . DEXA SCAN  05/07/2018  . TETANUS/TDAP  11/25/2019  . INFLUENZA VACCINE  Completed  . Hepatitis C Screening  Completed  . PNA vac Low Risk Adult  Completed    Cancer Screenings:  Lung: Low Dose CT Chest recommended if Age 41-80 years, 30 pack-year currently smoking OR have quit w/in 15years. Patient does qualify but is up to date. Breast:  Up to date on Mammogram? No, but pt plans to schedule this in 2019. Up to date of Bone Density/Dexa? Yes Colorectal: Up to date  Additional Screenings:  Hepatitis B/HIV/Syphillis: Pt declines today.  Hepatitis C Screening: Pt declines today.      Plan:  I have personally reviewed and addressed the Medicare Annual Wellness questionnaire and have noted the following in the patient's chart:  A. Medical and social history B. Use of alcohol, tobacco or illicit drugs  C. Current medications and supplements D. Functional ability and status E.  Nutritional status F.  Physical activity G. Advance directives H. List of other physicians I.  Hospitalizations, surgeries, and ER visits in previous 12 months J.  [place] such as hearing and vision if needed, cognitive and depression L. Referrals and appointments - none  In addition, I have reviewed and discussed  with patient certain preventive protocols, quality metrics, and best practice recommendations. A  written personalized care plan for preventive services as well as general preventive health recommendations were provided to patient.  See attached scanned questionnaire for additional information.   Signed,  Fabio Neighbors, LPN Nurse Health Advisor   Nurse Recommendations: None. Pt to scheduled mammogram in early 2019.

## 2017-04-24 NOTE — Patient Instructions (Signed)
Cathy Watts , Thank you for taking time to come for your Medicare Wellness Visit. I appreciate your ongoing commitment to your health goals. Please review the following plan we discussed and let me know if I can assist you in the future.   Screening recommendations/referrals: Colonoscopy: Up to date Mammogram: Pt to set this up in 2019 Bone Density: Up to date Recommended yearly ophthalmology/optometry visit for glaucoma screening and checkup Recommended yearly dental visit for hygiene and checkup  Vaccinations: Influenza vaccine: Completed today Pneumococcal vaccine: Up to date Tdap vaccine: Up to date Shingles vaccine: Pt declines today.   Advanced directives: Please bring a copy of your POA (Power of Attorney) and/or Living Will to your next appointment.   Conditions/risks identified: Obesity- recommend eating 2 servings of vegetables and fruits a day. Substitute for junk food.   Next appointment: 04/28/17 @ 10:00 AM   Preventive Care 65 Years and Older, Female Preventive care refers to lifestyle choices and visits with your health care provider that can promote health and wellness. What does preventive care include?  A yearly physical exam. This is also called an annual well check.  Dental exams once or twice a year.  Routine eye exams. Ask your health care provider how often you should have your eyes checked.  Personal lifestyle choices, including:  Daily care of your teeth and gums.  Regular physical activity.  Eating a healthy diet.  Avoiding tobacco and drug use.  Limiting alcohol use.  Practicing safe sex.  Taking low-dose aspirin every day.  Taking vitamin and mineral supplements as recommended by your health care provider. What happens during an annual well check? The services and screenings done by your health care provider during your annual well check will depend on your age, overall health, lifestyle risk factors, and family history of  disease. Counseling  Your health care provider may ask you questions about your:  Alcohol use.  Tobacco use.  Drug use.  Emotional well-being.  Home and relationship well-being.  Sexual activity.  Eating habits.  History of falls.  Memory and ability to understand (cognition).  Work and work Statistician.  Reproductive health. Screening  You may have the following tests or measurements:  Height, weight, and BMI.  Blood pressure.  Lipid and cholesterol levels. These may be checked every 5 years, or more frequently if you are over 18 years old.  Skin check.  Lung cancer screening. You may have this screening every year starting at age 41 if you have a 30-pack-year history of smoking and currently smoke or have quit within the past 15 years.  Fecal occult blood test (FOBT) of the stool. You may have this test every year starting at age 22.  Flexible sigmoidoscopy or colonoscopy. You may have a sigmoidoscopy every 5 years or a colonoscopy every 10 years starting at age 59.  Hepatitis C blood test.  Hepatitis B blood test.  Sexually transmitted disease (STD) testing.  Diabetes screening. This is done by checking your blood sugar (glucose) after you have not eaten for a while (fasting). You may have this done every 1-3 years.  Bone density scan. This is done to screen for osteoporosis. You may have this done starting at age 78.  Mammogram. This may be done every 1-2 years. Talk to your health care provider about how often you should have regular mammograms. Talk with your health care provider about your test results, treatment options, and if necessary, the need for more tests. Vaccines  Your health  care provider may recommend certain vaccines, such as:  Influenza vaccine. This is recommended every year.  Tetanus, diphtheria, and acellular pertussis (Tdap, Td) vaccine. You may need a Td booster every 10 years.  Zoster vaccine. You may need this after age  81.  Pneumococcal 13-valent conjugate (PCV13) vaccine. One dose is recommended after age 1.  Pneumococcal polysaccharide (PPSV23) vaccine. One dose is recommended after age 45. Talk to your health care provider about which screenings and vaccines you need and how often you need them. This information is not intended to replace advice given to you by your health care provider. Make sure you discuss any questions you have with your health care provider. Document Released: 05/19/2015 Document Revised: 01/10/2016 Document Reviewed: 02/21/2015 Elsevier Interactive Patient Education  2017 [place] Prevention in the Home Falls can cause injuries. They can happen to people of all ages. There are many things you can do to make your home safe and to help prevent falls. What can I do on the outside of my home?  Regularly fix the edges of walkways and driveways and fix any cracks.  Remove anything that might make you trip as you walk through a door, such as a raised step or threshold.  Trim any bushes or trees on the path to your home.  Use bright outdoor lighting.  Clear any walking paths of anything that might make someone trip, such as rocks or tools.  Regularly check to see if handrails are loose or broken. Make sure that both sides of any steps have handrails.  Any raised decks and porches should have guardrails on the edges.  Have any leaves, snow, or ice cleared regularly.  Use sand or salt on walking paths during winter.  Clean up any spills in your garage right away. This includes oil or grease spills. What can I do in the bathroom?  Use night lights.  Install grab bars by the toilet and in the tub and shower. Do not use towel bars as grab bars.  Use non-skid mats or decals in the tub or shower.  If you need to sit down in the shower, use a plastic, non-slip stool.  Keep the floor dry. Clean up any water that spills on the floor as soon as it happens.  Remove  soap buildup in the tub or shower regularly.  Attach bath mats securely with double-sided non-slip rug tape.  Do not have throw rugs and other things on the floor that can make you trip. What can I do in the bedroom?  Use night lights.  Make sure that you have a light by your bed that is easy to reach.  Do not use any sheets or blankets that are too big for your bed. They should not hang down onto the floor.  Have a firm chair that has side arms. You can use this for support while you get dressed.  Do not have throw rugs and other things on the floor that can make you trip. What can I do in the kitchen?  Clean up any spills right away.  Avoid walking on wet floors.  Keep items that you use a lot in easy-to-reach places.  If you need to reach something above you, use a strong step stool that has a grab bar.  Keep electrical cords out of the way.  Do not use floor polish or wax that makes floors slippery. If you must use wax, use non-skid floor wax.  Do not have  throw rugs and other things on the floor that can make you trip. What can I do with my stairs?  Do not leave any items on the stairs.  Make sure that there are handrails on both sides of the stairs and use them. Fix handrails that are broken or loose. Make sure that handrails are as long as the stairways.  Check any carpeting to make sure that it is firmly attached to the stairs. Fix any carpet that is loose or worn.  Avoid having throw rugs at the top or bottom of the stairs. If you do have throw rugs, attach them to the floor with carpet tape.  Make sure that you have a light switch at the top of the stairs and the bottom of the stairs. If you do not have them, ask someone to add them for you. What else can I do to help prevent falls?  Wear shoes that:  Do not have high heels.  Have rubber bottoms.  Are comfortable and fit you well.  Are closed at the toe. Do not wear sandals.  If you use a  stepladder:  Make sure that it is fully opened. Do not climb a closed stepladder.  Make sure that both sides of the stepladder are locked into place.  Ask someone to hold it for you, if possible.  Clearly mark and make sure that you can see:  Any grab bars or handrails.  First and last steps.  Where the edge of each step is.  Use tools that help you move around (mobility aids) if they are needed. These include:  Canes.  Walkers.  Scooters.  Crutches.  Turn on the lights when you go into a dark area. Replace any light bulbs as soon as they burn out.  Set up your furniture so you have a clear path. Avoid moving your furniture around.  If any of your floors are uneven, fix them.  If there are any pets around you, be aware of where they are.  Review your medicines with your doctor. Some medicines can make you feel dizzy. This can increase your chance of falling. Ask your doctor what other things that you can do to help prevent falls. This information is not intended to replace advice given to you by your health care provider. Make sure you discuss any questions you have with your health care provider. Document Released: 02/16/2009 Document Revised: 09/28/2015 Document Reviewed: 05/27/2014 Elsevier Interactive Patient Education  2017 [employer].

## 2017-04-28 ENCOUNTER — Ambulatory Visit (INDEPENDENT_AMBULATORY_CARE_PROVIDER_SITE_OTHER): Payer: Medicare Other | Admitting: Physician Assistant

## 2017-04-28 ENCOUNTER — Encounter: Payer: Self-pay | Admitting: Physician Assistant

## 2017-04-28 VITALS — BP 130/88 | HR 94 | Temp 97.8°F | Resp 19 | Ht 70.0 in | Wt 216.6 lb

## 2017-04-28 DIAGNOSIS — G2581 Restless legs syndrome: Secondary | ICD-10-CM | POA: Diagnosis not present

## 2017-04-28 DIAGNOSIS — C3431 Malignant neoplasm of lower lobe, right bronchus or lung: Secondary | ICD-10-CM | POA: Diagnosis not present

## 2017-04-28 DIAGNOSIS — F17200 Nicotine dependence, unspecified, uncomplicated: Secondary | ICD-10-CM

## 2017-04-28 DIAGNOSIS — K5900 Constipation, unspecified: Secondary | ICD-10-CM

## 2017-04-28 DIAGNOSIS — M8589 Other specified disorders of bone density and structure, multiple sites: Secondary | ICD-10-CM | POA: Diagnosis not present

## 2017-04-28 DIAGNOSIS — Z0000 Encounter for general adult medical examination without abnormal findings: Secondary | ICD-10-CM

## 2017-04-28 DIAGNOSIS — Z1231 Encounter for screening mammogram for malignant neoplasm of breast: Secondary | ICD-10-CM

## 2017-04-28 DIAGNOSIS — J449 Chronic obstructive pulmonary disease, unspecified: Secondary | ICD-10-CM

## 2017-04-28 DIAGNOSIS — E7800 Pure hypercholesterolemia, unspecified: Secondary | ICD-10-CM | POA: Diagnosis not present

## 2017-04-28 DIAGNOSIS — Z1239 Encounter for other screening for malignant neoplasm of breast: Secondary | ICD-10-CM

## 2017-04-28 MED ORDER — ROPINIROLE HCL 3 MG PO TABS: 3.0000 mg | ORAL_TABLET | Freq: Every day | ORAL | 1 refills | Status: DC

## 2017-04-28 MED ORDER — POLYETHYLENE GLYCOL 3350 17 GM/SCOOP PO POWD: ORAL | 3 refills | Status: DC

## 2017-04-28 NOTE — Progress Notes (Signed)
Patient: Cathy Watts, Female    DOB: September 01, 1945, 71 y.o.   MRN: [id number] Visit Date: 04/28/2017  Today's Provider: Mar Daring, PA-C   Chief Complaint  Patient presents with  . Annual Exam   Subjective:    Annual physical exam [name] is a 71 y.o. female who presents today for health maintenance and complete physical. She feels well. She reports exercising none. She reports she is sleeping well.  Last CPE:04/24/17 with [facility]. Mammogram: 06/02/14 Bi-RADS 1-Order placed on 04/24/17 BMD: 05/07/13 in care everywhere. Total Femoral-Normal;Femoral-neck and spine-Osteopenia -----------------------------------------------------------------   Review of Systems  Constitutional: Negative.   HENT: Negative.   Eyes: Negative.   Respiratory: Positive for choking, shortness of breath and wheezing.        Patient with COPD  Cardiovascular: Negative.   Gastrointestinal: Negative.   Endocrine: Negative.   Genitourinary: Negative.   Musculoskeletal: Negative.   Skin: Negative.   Allergic/Immunologic: Negative.   Neurological: Negative.   Hematological: Negative.   Psychiatric/Behavioral: Negative.     Social History      She  reports that she has quit smoking. Her smoking use included cigarettes. She has a 68.75 pack-year smoking history. she has never used smokeless tobacco. She reports that she drinks alcohol. She reports that she does not use drugs.       Social History   Socioeconomic History  . Marital status: Significant Other    Spouse name: None  . Number of children: 3  . Years of education: None  . Highest education level: 12th grade  Social Needs  . Financial resource strain: Not hard at all  . Food insecurity - worry: Never true  . Food insecurity - inability: Never true  . Transportation needs - medical: No  . Transportation needs - non-medical: No  Occupational History  . Occupation: retired  Tobacco Use  . Smoking status: Former  Smoker    Packs/day: 1.25    Years: 55.00    Pack years: 68.75    Types: Cigarettes  . Smokeless tobacco: Never Used  . Tobacco comment: used to use E-cig  Substance and Sexual Activity  . Alcohol use: Yes    Alcohol/week: 0.0 oz    Comment: occasionally- wine  . Drug use: No  . Sexual activity: None  Other Topics Concern  . None  Social History Narrative  . None    Past Medical History:  Diagnosis Date  . Anxiety   . Asthma   . Barrett's esophagus   . COPD (chronic obstructive pulmonary disease) ([place])   . Depression   . GERD (gastroesophageal reflux disease)   . History of peptic ulcer disease   . Low oxygen saturation    2l hs  . Osteopenia   . Panic disorder   . Personal history of tobacco use, presenting hazards to health 01/16/2015  . Sleep apnea   . SOB (shortness of breath)   . Squamous cell carcinoma of right lung [facility])      Patient Active Problem List   Diagnosis Date Noted  . Chronic venous insufficiency 04/11/2016  . Leg pain 04/11/2016  . Varicose veins of bilateral lower extremities with pain 03/12/2016  . Cancer of lower lobe of right lung ([place]) 10/09/2015  . Oropharyngeal candidiasis 07/12/2015  . Personal history of tobacco use, presenting hazards to health 01/16/2015  . Airway hyperreactivity 11/15/2014  . CN (constipation) 11/15/2014  . Daytime somnolence 11/15/2014  . Clinical depression 11/15/2014  .  DD (diverticular disease) 11/15/2014  . Accumulation of fluid in tissues 11/15/2014  . H/O peptic ulcer 11/15/2014  . Osteopenia 11/15/2014  . Awareness of heartbeats 11/15/2014  . Episodic paroxysmal anxiety disorder 11/15/2014  . Apnea, sleep 11/15/2014  . Snores 11/15/2014  . Breath shortness 11/15/2014  . Compulsive tobacco user syndrome 11/15/2014  . Avitaminosis D 11/15/2014  . Barrett esophagus 10/19/2014  . Acid reflux 10/19/2014  . COPD, moderate ([place]) 08/31/2013  . Dyspnea 08/31/2013  . Otitis, externa, infective 08/31/2013    . Nocturnal hypoxemia 08/31/2013  . Hypercholesterolemia without hypertriglyceridemia 11/05/2005    Past Surgical History:  Procedure Laterality Date  . CERVICAL CONE BIOPSY  1990  . CHOLECYSTECTOMY  1980's  . colonoscopy  2014  . ELBOW FRACTURE SURGERY Right 2009  . ENDOBRONCHIAL ULTRASOUND N/A 02/07/2015   Procedure: ENDOBRONCHIAL ULTRASOUND;  Surgeon: Flora Lipps, MD;  Location: [facility] ORS;  Service: Cardiopulmonary;  Laterality: N/A;  . ESOPHAGOGASTRODUODENOSCOPY (EGD) WITH PROPOFOL N/A 02/26/2016   Procedure: ESOPHAGOGASTRODUODENOSCOPY (EGD) WITH PROPOFOL;  Surgeon: Lollie Sails, MD;  Location[facility] ENDOSCOPY;  Service: Endoscopy;  Laterality: N/A;  COPD, Sleep Apnea  . TONSILLECTOMY  1979  . TUBAL LIGATION  1970's  . UPPER GI ENDOSCOPY      Family History        Family Status  Relation Name Status  . Mother  Deceased at age 16  . Father  Deceased at age 72  . Brother 1 Deceased  . Sister 1 Alive  . Ethlyn Daniels  Deceased  . Sister 2 Deceased  . Sister 3 Deceased  . Brother 2 Deceased        Her family history includes Arthritis in her mother; Breast cancer in her sister; Cancer in her paternal aunt; Cirrhosis in her sister; Colon cancer in her mother; Diabetes in her mother and sister; Glaucoma in her mother; Heart attack in her sister; Ovarian cancer in her sister; Parkinson's disease in her sister; Stomach cancer in her father; Throat cancer in her brother.     Allergies  Allergen Reactions  . Amoxicillin Hives and Itching     Current Outpatient Medications:  .  albuterol (PROAIR HFA) 108 (90 Base) MCG/ACT inhaler, Inhale 2 puffs into the lungs every 4 (four) hours as needed for wheezing or shortness of breath., Disp: 1 each, Rfl: 2 .  aspirin EC 81 MG tablet, Take 81 mg by mouth every other day., Disp: , Rfl:  .  busPIRone (BUSPAR) 30 MG tablet, Take 1 tablet (30 mg total) by mouth 2 (two) times daily., Disp: 60 tablet, Rfl: 11 .  CALCIUM CARBONATE-VIT D-MIN PO,  Take 1 tablet by mouth 2 (two) times daily. , Disp: , Rfl:  .  clotrimazole-betamethasone (LOTRISONE) cream, LOTRISONE, 1-0.05% (External Cream)  1 (one) Cream Cream Apply twice daily to affected area.  Not for Internal use.  (Correction from prev script said not for external use and should read , not for internal use) for 0 days  Quantity: 45;  Refills: 1   Ordered :22-Apr-2013  Renaldo Fiddler ;  Started 22-Apr-2013 Active, Disp: , Rfl:  .  escitalopram (LEXAPRO) 20 MG tablet, TAKE ONE TABLET BY MOUTH ONCE DAILY, Disp: 90 tablet, Rfl: 1 .  NON FORMULARY, , Disp: , Rfl:  .  Omega-3 Fatty Acids (FISH OIL) 1200 MG CAPS, Take 1 capsule by mouth daily., Disp: , Rfl:  .  omeprazole (PRILOSEC) 20 MG capsule, TAKE ONE CAPSULE BY MOUTH ONCE DAILY, Disp: 90 capsule, Rfl:  3 .  polyethylene glycol powder (GLYCOLAX/MIRALAX) powder, TAKE 17 GRAMS OF POWDER MIXED IN 8 OUNCES OF WATER BY MOUTH ONCE A DAY., Disp: 255 g, Rfl: 3 .  pravastatin (PRAVACHOL) 20 MG tablet, TAKE 1 TABLET BY MOUTH AT BEDTIME, Disp: 90 tablet, Rfl: 1 .  rOPINIRole (REQUIP) 1 MG tablet, TAKE 1 TO 2 TABLETS BY MOUTH AT BEDTIME, Disp: 180 tablet, Rfl: 1   Patient Care Team: Mar Daring, [name], MD as Consulting Physician (Ophthalmology)      Objective:   Vitals: BP 130/88 (BP Location: Right Arm, Patient Position: Sitting, Cuff Size: Normal)   Pulse 94   Temp 97.8 F (36.6 C) (Oral)   Resp 19   Ht 5\' 10"  (1.778 m)   Wt 216 lb 9.6 oz (98.2 kg)   SpO2 97%   BMI 31.08 kg/m     Physical Exam  Constitutional: She is oriented to person, place, and time. She appears well-developed and well-nourished. No distress.  HENT:  Head: Normocephalic and atraumatic.  Right Ear: Hearing, tympanic membrane, external ear and ear canal normal.  Left Ear: Hearing, tympanic membrane, external ear and ear canal normal.  Nose: Nose normal.  Mouth/Throat: Uvula is midline, oropharynx is  clear and moist and mucous membranes are normal. No oropharyngeal exudate.  Eyes: Conjunctivae and EOM are normal. Pupils are equal, round, and reactive to light. Right eye exhibits no discharge. Left eye exhibits no discharge. No scleral icterus.  Neck: Normal range of motion. Neck supple. No JVD present. Carotid bruit is not present. No tracheal deviation present. No thyromegaly present.  Cardiovascular: Normal rate, regular rhythm, normal heart sounds and intact distal pulses. Exam reveals no gallop and no friction rub.  No murmur heard. Pulmonary/Chest: Effort normal. No respiratory distress. She has no decreased breath sounds. She has wheezes (expiratory throughout). She has no rhonchi. She has no rales. She exhibits no tenderness.  Abdominal: Soft. Bowel sounds are normal. She exhibits no distension and no mass. There is no tenderness. There is no rebound and no guarding.  Musculoskeletal: Normal range of motion. She exhibits no edema or tenderness.  Lymphadenopathy:    She has no cervical adenopathy.  Neurological: She is alert and oriented to person, place, and time. She has normal reflexes.  Skin: Skin is warm and dry. No rash noted. She is not diaphoretic.  Psychiatric: She has a normal mood and affect. Her behavior is normal. Judgment and thought content normal.  Vitals reviewed.    Depression Screen PHQ 2/9 Scores 04/24/2017 05/13/2016 04/24/2016 10/09/2015  PHQ - 2 Score 1 0 0 0      Assessment & Plan:     Routine Health Maintenance and Physical Exam  Exercise Activities and Dietary recommendations Goals    . DIET - EAT MORE FRUITS AND VEGETABLES     Recommend eating 2 servings of vegetables and fruits a day. Substitute for junk food.        Immunization History  Administered Date(s) Administered  . Influenza Split 04/11/2011, 04/15/2012, 04/02/2013  . Influenza, High Dose Seasonal PF 04/24/2016, 04/24/2017  . Influenza, Seasonal, Injecte, Preservative Fre 04/25/2015   . Influenza,inj,Quad PF,6+ Mos 04/20/2013  . Influenza-Unspecified 02/08/2014  . Pneumococcal Conjugate-13 04/22/2014  . Pneumococcal Polysaccharide-23 04/20/2013  . Pneumococcal-Unspecified 04/02/2013, 04/10/2014  . Tdap 11/24/2009  . Zoster 07/19/2013    Health Maintenance  Topic Date Due  . MAMMOGRAM  06/02/2016  . COLONOSCOPY  07/21/2017  .  DEXA SCAN  05/07/2018  . TETANUS/TDAP  11/25/2019  . INFLUENZA VACCINE  Completed  . Hepatitis C Screening  Completed  . PNA vac Low Risk Adult  Completed     Discussed health benefits of physical activity, and encouraged her to engage in regular exercise appropriate for her age and condition.    1. Annual physical exam Normal exam today.  2. Breast cancer screening There is no family history of breast cancer. She does perform regular self breast exams. Mammogram was ordered as below. Information for [facility] Breast clinic was given to patient so she may schedule her mammogram at her convenience. - MM Digital Screening; Future  3. Osteopenia of multiple sites Bone density in 2015 showed osteopenia. No BMD since. Will repeat BMD as below.  - DG Bone Density; Future  4. Hypercholesteremia Stable on pravastatin. Will order lipids as below. I will send a message to Dr. Rogue Bussing to see if this can be added on to her labs in 6 months.  - Lipid Profile; Future  5. Constipation, unspecified constipation type Stable. Diagnosis pulled for medication refill. Continue current medical treatment plan. - polyethylene glycol powder (GLYCOLAX/MIRALAX) powder; TAKE 17 GRAMS OF POWDER MIXED IN 8 OUNCES OF WATER BY MOUTH ONCE A DAY.  Dispense: 255 g; Refill: 3  6. RLS (restless legs syndrome) Worsening. Will increase to 3mg  as below.  - rOPINIRole (REQUIP) 3 MG tablet; Take 1 tablet (3 mg total) by mouth at bedtime.  Dispense: 90 tablet; Refill: 1  7. Cancer of lower lobe of right lung ([place]) Followed by Dr. Rogue Bussing.   8. COPD, moderate  ([place]) Stable. Returns in 6 months for CT chest.  9. Compulsive tobacco user syndrome Patient does not desire to quit smoking.   --------------------------------------------------------------------    Mar Daring, PA-C  Agency

## 2017-04-28 NOTE — Patient Instructions (Signed)

## 2017-05-02 ENCOUNTER — Other Ambulatory Visit: Payer: Self-pay | Admitting: Internal Medicine

## 2017-05-02 ENCOUNTER — Telehealth: Payer: Self-pay | Admitting: Physician Assistant

## 2017-05-02 DIAGNOSIS — E7800 Pure hypercholesterolemia, unspecified: Secondary | ICD-10-CM

## 2017-05-02 DIAGNOSIS — K5900 Constipation, unspecified: Secondary | ICD-10-CM

## 2017-05-02 MED ORDER — POLYETHYLENE GLYCOL 3350 17 GM/SCOOP PO POWD: ORAL | 3 refills | Status: AC

## 2017-05-02 NOTE — Telephone Encounter (Signed)
Please review. Thanks!  

## 2017-05-02 NOTE — Telephone Encounter (Signed)
Patient states that Cathy Watts is saying that they did not receive the prescription for polyethylene glycol powder (GLYCOLAX/MIRALAX) powder that you sent in on 04/28/2017.  She would like to know if we can resend.

## 2017-05-02 NOTE — Telephone Encounter (Signed)
resent

## 2017-05-14 ENCOUNTER — Other Ambulatory Visit: Payer: Self-pay | Admitting: Physician Assistant

## 2017-05-14 DIAGNOSIS — F419 Anxiety disorder, unspecified: Secondary | ICD-10-CM

## 2017-05-26 ENCOUNTER — Ambulatory Visit
Admission: RE | Admit: 2017-05-26 | Discharge: 2017-05-26 | Disposition: A | Discharge status: Home / Self Care | Payer: Medicare Other | Source: Ambulatory Visit | Attending: Radiation Oncology | Admitting: Radiation Oncology

## 2017-05-26 ENCOUNTER — Encounter: Payer: Self-pay | Admitting: Radiation Oncology

## 2017-05-26 ENCOUNTER — Other Ambulatory Visit: Payer: Self-pay

## 2017-05-26 VITALS — BP 149/100 | HR 104 | Temp 97.3°F | Resp 20 | Wt 210.0 lb

## 2017-05-26 DIAGNOSIS — Z85118 Personal history of other malignant neoplasm of bronchus and lung: Secondary | ICD-10-CM | POA: Diagnosis present

## 2017-05-26 DIAGNOSIS — F17210 Nicotine dependence, cigarettes, uncomplicated: Secondary | ICD-10-CM | POA: Insufficient documentation

## 2017-05-26 DIAGNOSIS — C3411 Malignant neoplasm of upper lobe, right bronchus or lung: Secondary | ICD-10-CM

## 2017-05-26 DIAGNOSIS — Z923 Personal history of irradiation: Secondary | ICD-10-CM | POA: Diagnosis not present

## 2017-05-26 DIAGNOSIS — R05 Cough: Secondary | ICD-10-CM | POA: Insufficient documentation

## 2017-05-26 NOTE — Progress Notes (Signed)
Radiation Oncology Follow up Note  Name: Cathy Watts   Date:   05/26/2017 MRN:  [id number] DOB: [birth year]    This 72 y.o. female presents to the clinic today for 2 year follow-up status post radiation therapy to her right hilum for stage I squamous cell carcinoma.  REFERRING PROVIDER: Florian Buff*  HPI: Patient is a 72 year old female now seen out 2 years having received radiation therapy to her right hilum for a T1c squamous cell carcinoma. Seen today in routine follow-up she continues to do well. She has a mild nonproductive cough. She specifically denies hemoptysis or chest tightness. Recently had a CT scan. Showing similar radiation changes to the right parahilar region with no evidence of local recurrence or metastatic disease.  COMPLICATIONS OF TREATMENT: none  FOLLOW UP COMPLIANCE: keeps appointments   PHYSICAL EXAM:  BP (!) 149/100   Pulse (!) 104   Temp (!) 97.3 F (36.3 C)   Resp 20   Wt 209 lb 15.8 oz (95.2 kg)   BMI 30.13 kg/m  Well-developed well-nourished patient in NAD. HEENT reveals PERLA, EOMI, discs not visualized.  Oral cavity is clear. No oral mucosal lesions are identified. Neck is clear without evidence of cervical or supraclavicular adenopathy. Lungs are clear to A&P. Cardiac examination is essentially unremarkable with regular rate and rhythm without murmur rub or thrill. Abdomen is benign with no organomegaly or masses noted. Motor sensory and DTR levels are equal and symmetric in the upper and lower extremities. Cranial nerves II through XII are grossly intact. Proprioception is intact. No peripheral adenopathy or edema is identified. No motor or sensory levels are noted. Crude visual fields are within normal range.  RADIOLOGY RESULTS: CT scans reviewed and compatible with the above-stated findings  PLAN: Present time patient is doing well with no evidence of disease. I'm please were overall progress. She's are scheduled for follow-up CT  scans in 6 months time. I have asked to see her back in 1 year for follow-up. Patient knows to call with any concerns.  I would like to take this opportunity to thank you for allowing me to participate in the care of your patient.Noreene Filbert, MD

## 2017-06-03 ENCOUNTER — Other Ambulatory Visit: Payer: Medicare Other

## 2017-06-09 ENCOUNTER — Telehealth: Payer: Self-pay | Admitting: Internal Medicine

## 2017-06-09 NOTE — Telephone Encounter (Signed)
Please advise on message below.

## 2017-06-09 NOTE — Telephone Encounter (Signed)
Pt is requesting rx for coughing and congestion,. Please call.

## 2017-06-10 MED ORDER — PREDNISONE 20 MG PO TABS: 20.0000 mg | ORAL_TABLET | Freq: Every day | ORAL | 0 refills | Status: DC

## 2017-06-10 NOTE — Telephone Encounter (Signed)
LMOVM for pt letting her know that we have sent in Prednisone per DK. Informed to call back with any questions.

## 2017-06-10 NOTE — Telephone Encounter (Signed)
Prednisone 20 mg daily for 5 days

## 2017-06-26 ENCOUNTER — Emergency Department: Payer: Medicare Other

## 2017-06-26 ENCOUNTER — Other Ambulatory Visit: Payer: Self-pay

## 2017-06-26 ENCOUNTER — Emergency Department
Admission: EM | Admit: 2017-06-26 | Discharge: 2017-06-27 | Disposition: A | Discharge status: Home / Self Care | Payer: Medicare Other | Attending: Emergency Medicine | Admitting: Emergency Medicine

## 2017-06-26 DIAGNOSIS — R109 Unspecified abdominal pain: Secondary | ICD-10-CM | POA: Insufficient documentation

## 2017-06-26 DIAGNOSIS — R55 Syncope and collapse: Secondary | ICD-10-CM

## 2017-06-26 DIAGNOSIS — R5383 Other fatigue: Secondary | ICD-10-CM | POA: Insufficient documentation

## 2017-06-26 DIAGNOSIS — Z79899 Other long term (current) drug therapy: Secondary | ICD-10-CM | POA: Insufficient documentation

## 2017-06-26 DIAGNOSIS — Z87891 Personal history of nicotine dependence: Secondary | ICD-10-CM | POA: Insufficient documentation

## 2017-06-26 DIAGNOSIS — J449 Chronic obstructive pulmonary disease, unspecified: Secondary | ICD-10-CM | POA: Diagnosis not present

## 2017-06-26 DIAGNOSIS — Z7982 Long term (current) use of aspirin: Secondary | ICD-10-CM | POA: Diagnosis not present

## 2017-06-26 LAB — CBC WITH DIFFERENTIAL/PLATELET
BASOS PCT: 1 %
Basophils Absolute: 0.1 10*3/uL (ref 0–0.1)
Eosinophils Absolute: 0.2 10*3/uL (ref 0–0.7)
Eosinophils Relative: 2 %
HEMATOCRIT: 43 % (ref 35.0–47.0)
HEMOGLOBIN: 14.5 g/dL (ref 12.0–16.0)
LYMPHS ABS: 1.6 10*3/uL (ref 1.0–3.6)
LYMPHS PCT: 14 %
MCH: 30.7 pg (ref 26.0–34.0)
MCHC: 33.6 g/dL (ref 32.0–36.0)
MCV: 91.2 fL (ref 80.0–100.0)
MONOS PCT: 8 %
Monocytes Absolute: 0.9 10*3/uL (ref 0.2–0.9)
NEUTROS ABS: 8.3 10*3/uL — AB (ref 1.4–6.5)
Neutrophils Relative %: 75 %
Platelets: 251 10*3/uL (ref 150–440)
RBC: 4.72 MIL/uL (ref 3.80–5.20)
RDW: 14 % (ref 11.5–14.5)
WBC: 11 10*3/uL (ref 3.6–11.0)

## 2017-06-26 LAB — LIPASE, BLOOD: Lipase: 24 U/L (ref 11–51)

## 2017-06-26 LAB — COMPREHENSIVE METABOLIC PANEL
ALBUMIN: 4 g/dL (ref 3.5–5.0)
ALT: 15 U/L (ref 14–54)
AST: 19 U/L (ref 15–41)
Alkaline Phosphatase: 56 U/L (ref 38–126)
Anion gap: 12 (ref 5–15)
BILIRUBIN TOTAL: 0.3 mg/dL (ref 0.3–1.2)
BUN: 16 mg/dL (ref 6–20)
CO2: 23 mmol/L (ref 22–32)
Calcium: 8.8 mg/dL — ABNORMAL LOW (ref 8.9–10.3)
Chloride: 102 mmol/L (ref 101–111)
Creatinine, Ser: 0.8 mg/dL (ref 0.44–1.00)
GFR calc Af Amer: >60 mL/min (ref >60)
GFR calc non Af Amer: >60 mL/min (ref >60)
GLUCOSE: 150 mg/dL — AB (ref 65–99)
POTASSIUM: 3.5 mmol/L (ref 3.5–5.1)
SODIUM: 137 mmol/L (ref 135–145)
TOTAL PROTEIN: 7.1 g/dL (ref 6.5–8.1)

## 2017-06-26 LAB — TROPONIN I: Troponin I: <0.03 ng/mL (ref <0.03)

## 2017-06-26 NOTE — ED Provider Notes (Signed)
[facility] Emergency Department Provider Note  ____________________________________________   First MD Initiated Contact with Patient 06/26/17 2318     (approximate)  I have reviewed the triage vital signs and the nursing notes.   HISTORY  Chief Complaint Weakness; Dizziness; and Emesis   HPI Cathy Watts is a 72 y.o. female comes to the emergency department via EMS with fatigue and near syncope.  Patient has had some nausea and nonbloody emesis.  Generalized myalgia and fatigue.  Mild cramping upper abdominal discomfort.  Nothing seems to make it better or worse.  She denies palpitations.  She denies actually passing out.  Her symptoms are somewhat worse when standing up and somewhat improved when lying down.  Her pain is nonradiating.  Past Medical History:  Diagnosis Date  . Anxiety   . Asthma   . Barrett's esophagus   . COPD (chronic obstructive pulmonary disease) ([place])   . Depression   . GERD (gastroesophageal reflux disease)   . History of peptic ulcer disease   . Low oxygen saturation    2l hs  . Osteopenia   . Panic disorder   . Personal history of tobacco use, presenting hazards to health 01/16/2015  . Sleep apnea   . SOB (shortness of breath)   . Squamous cell carcinoma of right lung [facility])     Patient Active Problem List   Diagnosis Date Noted  . Chronic venous insufficiency 04/11/2016  . Varicose veins of bilateral lower extremities with pain 03/12/2016  . Cancer of lower lobe of right lung ([place]) 10/09/2015  . Oropharyngeal candidiasis 07/12/2015  . Personal history of tobacco use, presenting hazards to health 01/16/2015  . Airway hyperreactivity 11/15/2014  . CN (constipation) 11/15/2014  . Daytime somnolence 11/15/2014  . Clinical depression 11/15/2014  . DD (diverticular disease) 11/15/2014  . Accumulation of fluid in tissues 11/15/2014  . H/O peptic ulcer 11/15/2014  . Osteopenia 11/15/2014  . Awareness of heartbeats  11/15/2014  . Episodic paroxysmal anxiety disorder 11/15/2014  . Apnea, sleep 11/15/2014  . Snores 11/15/2014  . Breath shortness 11/15/2014  . Compulsive tobacco user syndrome 11/15/2014  . Avitaminosis D 11/15/2014  . Barrett esophagus 10/19/2014  . Acid reflux 10/19/2014  . COPD, moderate ([place]) 08/31/2013  . Dyspnea 08/31/2013  . Otitis, externa, infective 08/31/2013  . Nocturnal hypoxemia 08/31/2013  . Hypercholesterolemia without hypertriglyceridemia 11/05/2005    Past Surgical History:  Procedure Laterality Date  . CERVICAL CONE BIOPSY  1990  . CHOLECYSTECTOMY  1980's  . colonoscopy  2014  . ELBOW FRACTURE SURGERY Right 2009  . ENDOBRONCHIAL ULTRASOUND N/A 02/07/2015   Procedure: ENDOBRONCHIAL ULTRASOUND;  Surgeon: Flora Lipps, MD;  Location: [facility] ORS;  Service: Cardiopulmonary;  Laterality: N/A;  . ESOPHAGOGASTRODUODENOSCOPY (EGD) WITH PROPOFOL N/A 02/26/2016   Procedure: ESOPHAGOGASTRODUODENOSCOPY (EGD) WITH PROPOFOL;  Surgeon: Lollie Sails, MD;  Location: [facility] ENDOSCOPY;  Service: Endoscopy;  Laterality: N/A;  COPD, Sleep Apnea  . TONSILLECTOMY  1979  . TUBAL LIGATION  1970's  . UPPER GI ENDOSCOPY      Prior to Admission medications   Medication Sig Start Date End Date Taking? Authorizing Provider  aspirin EC 81 MG tablet Take 81 mg by mouth every other day.   Yes [provider]  busPIRone (BUSPAR) 30 MG tablet Take 1 tablet (30 mg total) by mouth 2 (two) times daily. 10/17/16  Yes Mar Daring, PA-C  CALCIUM CARBONATE-VIT D-MIN PO Take 1 tablet by mouth 2 (two) times daily.  01/02/11  Yes [provider]  NON FORMULARY    Yes [provider]  Omega-3 Fatty Acids (FISH OIL) 1200 MG CAPS Take 1 capsule by mouth daily.   Yes [provider]  omeprazole (PRILOSEC) 20 MG capsule TAKE ONE CAPSULE BY MOUTH ONCE DAILY 06/06/15  Yes Fenton Malling M, PA-C  pravastatin (PRAVACHOL) 20 MG tablet TAKE 1 TABLET BY MOUTH AT BEDTIME  02/05/17  Yes Burnette, Jennifer M, PA-C  rOPINIRole (REQUIP) 3 MG tablet Take 1 tablet (3 mg total) by mouth at bedtime. 04/28/17  Yes Mar Daring, PA-C  albuterol (PROAIR HFA) 108 (90 Base) MCG/ACT inhaler Inhale 2 puffs into the lungs every 4 (four) hours as needed for wheezing or shortness of breath. 03/31/17   Flora Lipps, MD  BESIVANCE 0.6 % SUSP  05/22/17   [provider]  clotrimazole-betamethasone (LOTRISONE) cream LOTRISONE, 1-0.05% (External Cream)  1 (one) Cream Cream Apply twice daily to affected area.  Not for Internal use.  (Correction from prev script said not for external use and should read , not for internal use) for 0 days  Quantity: 45;  Refills: 1   Ordered :22-Apr-2013  Renaldo Fiddler ;  Started 22-Apr-2013 Active 04/22/13   [provider]  DUREZOL 0.05 % EMUL  05/22/17   [provider]  escitalopram (LEXAPRO) 20 MG tablet TAKE 1 TABLET BY MOUTH ONCE DAILY Patient not taking: Reported on 05/26/2017 05/14/17   Mar Daring, PA-C  polyethylene glycol powder (GLYCOLAX/MIRALAX) powder TAKE 17 GRAMS OF POWDER MIXED IN 8 OUNCES OF WATER BY MOUTH ONCE A DAY. 05/02/17   Mar Daring, PA-C  predniSONE (DELTASONE) 20 MG tablet Take 1 tablet (20 mg total) by mouth daily with breakfast. Patient not taking: Reported on 06/26/2017 06/10/17   Flora Lipps, MD    Allergies Amoxicillin  Family History  Problem Relation Age of Onset  . Colon cancer Mother        colon  . Diabetes Mother   . Arthritis Mother   . Glaucoma Mother   . Stomach cancer Father        stomach  . Throat cancer Brother        throat  . Breast cancer Sister        breast  . Diabetes Sister   . Cirrhosis Sister   . Cancer Paternal Aunt   . Parkinson's disease Sister   . Heart attack Sister   . Ovarian cancer Sister     Social History Social History   Tobacco Use  . Smoking status: Former Smoker    Packs/day: 1.25    Years: 55.00    Pack  years: 68.75    Types: Cigarettes  . Smokeless tobacco: Never Used  . Tobacco comment: used to use E-cig  Substance Use Topics  . Alcohol use: Yes    Alcohol/week: 0.0 oz    Comment: occasionally- wine  . Drug use: No    Review of Systems Constitutional: No fever/chills Eyes: No visual changes. ENT: No sore throat. Cardiovascular: Denies chest pain. Respiratory: Denies shortness of breath. Gastrointestinal: Positive for abdominal pain.  Positive for nausea, positive for vomiting.  No diarrhea.  No constipation. Genitourinary: Negative for dysuria. Musculoskeletal: Negative for back pain. Skin: Negative for rash. Neurological: Negative for headaches, focal weakness or numbness.   ____________________________________________   PHYSICAL EXAM:  VITAL SIGNS: ED Triage Vitals  Enc Vitals Group     BP --      Pulse --  Resp --      Temp --      Temp src --      SpO2 --      Weight 06/26/17 2315 210 lb (95.3 kg)     Height 06/26/17 2315 5\' 11"  (1.803 m)     Head Circumference --      Peak Flow --      Pain Score 06/26/17 2314 0     Pain Loc --      Pain Edu? --      Excl. in [place]? --     Constitutional: Alert and oriented x4 somewhat uncomfortable appearing nontoxic no diaphoresis speaks in full clear sentences Eyes: PERRL EOMI. Head: Atraumatic. Nose: No congestion/rhinnorhea. Mouth/Throat: No trismus Neck: No stridor.   Cardiovascular: Normal rate, regular rhythm. Grossly normal heart sounds.  Good peripheral circulation. Respiratory: Normal respiratory effort.  No retractions. Lungs CTAB and moving good air Gastrointestinal: Soft mild diffuse tenderness with no focality no rebound or guarding no peritonitis Musculoskeletal: No lower extremity edema   Neurologic:  Normal speech and language. No gross focal neurologic deficits are appreciated. Skin:  Skin is warm, dry and intact. No rash noted. Psychiatric: Mood and affect are normal. Speech and behavior are  normal.    ____________________________________________   DIFFERENTIAL includes but not limited to  Intracerebral hemorrhage, pyelonephritis, nephrolithiasis, urinary tract infection, appendicitis, diverticulitis ____________________________________________   LABS (all labs ordered are listed, but only abnormal results are displayed)  Labs Reviewed  COMPREHENSIVE METABOLIC PANEL - Abnormal; Notable for the following components:      Result Value   Glucose, Bld 150 (*)    Calcium 8.8 (*)    All other components within normal limits  CBC WITH DIFFERENTIAL/PLATELET - Abnormal; Notable for the following components:   Neutro Abs 8.3 (*)    All other components within normal limits  GASTROINTESTINAL PANEL BY PCR, STOOL (REPLACES STOOL CULTURE)  LIPASE, BLOOD  TROPONIN I    Lab work reviewed by me with no acute disease __________________________________________  EKG  ED ECG REPORT I, Darel Hong, the attending physician, personally viewed and interpreted this ECG.  Date: 06/26/2017 EKG Time:  Rate: 89 Rhythm: normal sinus rhythm QRS Axis: Leftward axis Intervals: normal ST/T Wave abnormalities: normal Narrative Interpretation: no evidence of acute ischemia  ____________________________________________  RADIOLOGY   Dominant CT reviewed by me with no acute disease Head CT reviewed by me with no acute disease ____________________________________________   PROCEDURES  Procedure(s) performed: no  Procedures  Critical Care performed: no  Observation: no ____________________________________________   INITIAL IMPRESSION / ASSESSMENT AND PLAN / ED COURSE  Pertinent labs & imaging results that were available during my care of the patient were reviewed by me and considered in my medical decision making (see chart for details).  Differential on a near syncopal nauseated elderly woman is broad and includes intracerebral as well as intra-abdominal pathology.   Concern for dehydration versus cardiogenic etiology of her symptoms.  We will keep her on monitor hydrate and CT her head and abdomen and reevaluate.     ----------------------------------------- 1:55 AM on 06/27/2017 -----------------------------------------  The patient feels improved.  She is ambulatory.  Vital signs have normalized.  She has been on monitor multiple hours with no ectopy.  Suspect contribution of dehydration.  Discharged home with primary care follow-up with strict return precautions.  The patient verbalizes understanding and agreement with the plan. ____________________________________________   FINAL CLINICAL IMPRESSION(S) / ED DIAGNOSES  Final diagnoses:  Near  syncope      NEW MEDICATIONS STARTED DURING THIS VISIT:  Discharge Medication List as of 06/27/2017  1:55 AM       Note:  This document was prepared using Dragon voice recognition software and may include unintentional dictation errors.     Darel Hong, MD 06/28/17 [phone]

## 2017-06-26 NOTE — ED Triage Notes (Signed)
Pt arrives to ED from home via [facility] with c/o weakness and near-syncope x1 day. EMS reports pt was treated for pneumonia 2 weeks ago. Pt states 2-3 episodes of non-bloody emesis en route; pt unsure of fever ay home (no temp taken at home), but denies any c/o pain, SHOB, or diarrhea. EMS reports initial BP upon their arrival was 69'V systolic, improved to 948/01 following 529mL of NS PIV. EMS reports giving 8mg  IV Zofran en route. Pt is A&O, in NAD; RR even, regular, and unlabored.

## 2017-06-27 ENCOUNTER — Emergency Department: Payer: Medicare Other

## 2017-06-27 MED ORDER — SODIUM CHLORIDE 0.9 % IV BOLUS (SEPSIS)
1000.0000 mL | Freq: Once | INTRAVENOUS | Status: AC
  Administered 2017-06-27: 1000 mL via INTRAVENOUS

## 2017-06-27 MED ORDER — IOPAMIDOL (ISOVUE-300) INJECTION 61%
100.0000 mL | Freq: Once | INTRAVENOUS | Status: AC | PRN
  Administered 2017-06-27: 100 mL via INTRAVENOUS

## 2017-06-27 NOTE — ED Notes (Signed)
Patient transported to CT 

## 2017-06-27 NOTE — Discharge Instructions (Signed)
Fortunately today your blood work and your CT scans were reassuring.  Please make sure you remain well-hydrated and follow-up with your primary care physician this coming Monday for reevaluation.  Return to the emergency department sooner for any concerns whatsoever.  It was a pleasure to take care of you today, and thank you for coming to our emergency department.  If you have any questions or concerns before leaving please ask the nurse to grab me and I'm more than happy to go through your aftercare instructions again.  If you were prescribed any opioid pain medication today such as Norco, Vicodin, Percocet, morphine, hydrocodone, or oxycodone please make sure you do not drive when you are taking this medication as it can alter your ability to drive safely.  If you have any concerns once you are home that you are not improving or are in fact getting worse before you can make it to your follow-up appointment, please do not hesitate to call 911 and come back for further evaluation.  Darel Hong, MD  Results for orders placed or performed during the hospital encounter of 06/26/17  Comprehensive metabolic panel  Result Value Ref Range   Sodium 137 135 - 145 mmol/L   Potassium 3.5 3.5 - 5.1 mmol/L   Chloride 102 101 - 111 mmol/L   CO2 23 22 - 32 mmol/L   Glucose, Bld 150 (H) 65 - 99 mg/dL   BUN 16 6 - 20 mg/dL   Creatinine, Ser 0.80 0.44 - 1.00 mg/dL   Calcium 8.8 (L) 8.9 - 10.3 mg/dL   Total Protein 7.1 6.5 - 8.1 g/dL   Albumin 4.0 3.5 - 5.0 g/dL   AST 19 15 - 41 U/L   ALT 15 14 - 54 U/L   Alkaline Phosphatase 56 38 - 126 U/L   Total Bilirubin 0.3 0.3 - 1.2 mg/dL   GFR calc non Af Amer >60 >60 mL/min   GFR calc Af Amer >60 >60 mL/min   Anion gap 12 5 - 15  Lipase, blood  Result Value Ref Range   Lipase 24 11 - 51 U/L  CBC with Diff  Result Value Ref Range   WBC 11.0 3.6 - 11.0 K/uL   RBC 4.72 3.80 - 5.20 MIL/uL   Hemoglobin 14.5 12.0 - 16.0 g/dL   HCT 43.0 35.0 - 47.0 %   MCV  91.2 80.0 - 100.0 fL   MCH 30.7 26.0 - 34.0 pg   MCHC 33.6 32.0 - 36.0 g/dL   RDW 14.0 11.5 - 14.5 %   Platelets 251 150 - 440 K/uL   Neutrophils Relative % 75 %   Neutro Abs 8.3 (H) 1.4 - 6.5 K/uL   Lymphocytes Relative 14 %   Lymphs Abs 1.6 1.0 - 3.6 K/uL   Monocytes Relative 8 %   Monocytes Absolute 0.9 0.2 - 0.9 K/uL   Eosinophils Relative 2 %   Eosinophils Absolute 0.2 0 - 0.7 K/uL   Basophils Relative 1 %   Basophils Absolute 0.1 0 - 0.1 K/uL  Troponin I  Result Value Ref Range   Troponin I <0.03 <0.03 ng/mL   Dg Chest 2 View  Result Date: 06/26/2017 CLINICAL DATA:  Weakness and near syncope EXAM: CHEST  2 VIEW COMPARISON:  CT chest 04/16/2017, 10/21/2016, 03/20/2016 PET-CT 02/07/2015, radiograph 11/16/2014 FINDINGS: No pleural effusion. Diffuse bronchitic changes. Asymmetrically dense right hilum, appears somewhat increased compared to scout images from previous chest CT. Normal heart size. Aortic atherosclerosis. No pneumothorax. Surgical clips  in the right upper quadrant. Chronic compression deformity of a midthoracic vertebra IMPRESSION: 1. No acute infiltrate or effusion. There are diffuse bronchitic changes 2. Asymmetrically dense right hilum, possibly increased compared to prior scout images from previous chest CTs; CT follow-up with contrast suggested to exclude recurrent hilar mass. Electronically Signed   By: Donavan Foil M.D.   On: 06/26/2017 23:54   Ct Head Wo Contrast  Result Date: 06/27/2017 CLINICAL DATA:  Near-syncope, vomiting. Weakness. Treated for pneumonia 2 weeks ago. History of lung cancer. EXAM: CT HEAD WITHOUT CONTRAST TECHNIQUE: Contiguous axial images were obtained from the base of the skull through the vertex without intravenous contrast. COMPARISON:  CT HEAD Sep 18, 2016 FINDINGS: BRAIN: No intraparenchymal hemorrhage, mass effect nor midline shift. The ventricles and sulci are normal for age. Patchy supratentorial white matter hypodensities less than  expected patient's age, though non-specific are most compatible with chronic small vessel ischemic disease. No acute large vascular territory infarcts. No abnormal extra-axial fluid collections. Basal cisterns are patent. Borderline partially empty sella. VASCULAR: Trace calcific atherosclerosis of the carotid siphons. SKULL: No skull fracture. No significant scalp soft tissue swelling. SINUSES/ORBITS: Minimal bilateral mastoid effusion without air cell coalescence. Included paranasal sinuses are well aerated. The included ocular globes and orbital contents are non-suspicious. Status post bilateral ocular lens implants. OTHER: None. IMPRESSION: Negative noncontrast CT HEAD for age. Electronically Signed   By: Elon Alas M.D.   On: 06/27/2017 00:38   Ct Abdomen Pelvis W Contrast  Result Date: 06/27/2017 CLINICAL DATA:  72 year old female with history of lung cancer. Concern for abdominal infection. EXAM: CT ABDOMEN AND PELVIS WITH CONTRAST TECHNIQUE: Multidetector CT imaging of the abdomen and pelvis was performed using the standard protocol following bolus administration of intravenous contrast. CONTRAST:  161mL ISOVUE-300 IOPAMIDOL (ISOVUE-300) INJECTION 61% COMPARISON:  PET-CT dated 02/07/2015 FINDINGS: Lower chest: The visualized lung bases are clear. No intra-abdominal free air or free fluid. Hepatobiliary: Cholecystectomy. There is mild intrahepatic biliary ductal dilatation. The common bile duct is also mildly dilated and measures 12 mm in diameter. A 9 mm low attenuating lesion in the left lobe of the liver is not well characterized but likely represents a cyst. Pancreas: Unremarkable. No pancreatic ductal dilatation or surrounding inflammatory changes. Spleen: Normal in size without focal abnormality. Adrenals/Urinary Tract: The adrenal glands are unremarkable. The kidneys, visualized ureters, and urinary bladder are unremarkable. There is symmetric enhancement and excretion of contrast by the  kidneys. Stomach/Bowel: There is sigmoid diverticulosis without active inflammatory changes. No bowel obstruction or active inflammation. Normal appendix. Vascular/Lymphatic: There is advanced aortoiliac atherosclerotic disease. The aorta is tortuous. There is a 3 cm infrarenal abdominal aortic aneurysm. Reproductive: The uterus is anteverted and grossly unremarkable. No pelvic mass. Other: No abdominal wall hernia or abnormality. No abdominopelvic ascites. Musculoskeletal: There is degenerative changes of the spine. No acute osseous pathology. There is grade 1 L5-S1 anterolisthesis. IMPRESSION: 1. No acute intra-abdominal or pelvic pathology. 2. Cholecystectomy with mild dilatation of the biliary trees, likely post cholecystectomy. 3. No bowel obstruction or active inflammation.  Normal appendix 4. A 3 cm infrarenal abdominal aortic aneurysm as well as Aortic Atherosclerosis (ICD10-I70.0). Electronically Signed   By: Anner Crete M.D.   On: 06/27/2017 00:52

## 2017-06-30 ENCOUNTER — Encounter: Payer: Self-pay | Admitting: Physician Assistant

## 2017-06-30 ENCOUNTER — Ambulatory Visit (INDEPENDENT_AMBULATORY_CARE_PROVIDER_SITE_OTHER): Payer: Medicare Other | Admitting: Physician Assistant

## 2017-06-30 VITALS — BP 108/80 | HR 98 | Temp 98.3°F | Resp 16 | Wt 205.0 lb

## 2017-06-30 DIAGNOSIS — I700 Atherosclerosis of aorta: Secondary | ICD-10-CM | POA: Diagnosis not present

## 2017-06-30 DIAGNOSIS — I714 Abdominal aortic aneurysm, without rupture, unspecified: Secondary | ICD-10-CM

## 2017-06-30 DIAGNOSIS — E860 Dehydration: Secondary | ICD-10-CM

## 2017-06-30 DIAGNOSIS — Z716 Tobacco abuse counseling: Secondary | ICD-10-CM | POA: Diagnosis not present

## 2017-06-30 MED ORDER — VARENICLINE TARTRATE 0.5 MG X 11 & 1 MG X 42 PO MISC: ORAL | 0 refills | Status: DC

## 2017-06-30 NOTE — Progress Notes (Signed)
Patient: Cathy Watts Female    DOB: 1945/12/03   72 y.o.   MRN: [id number] Visit Date: 06/30/2017  Today's Provider: Mar Daring, PA-C   Chief Complaint  Patient presents with  . Follow-up   Subjective:    HPI  Follow up ER visit  Patient was seen in ER for weakness; dizziness; and Emesis  on 06/26/2017. Patient reports symptoms lasted about 45 minutes. Patient reports EMS checked her out and was advised to go to ER due to low blood pressure. Patient was taken by ambulance to ER. Patient reports EMS read BP at 53/67. She was treated for near syncope . Treatment for this included labs, scan and fluids. She reports excellent compliance with treatment. She reports this condition is Improved. ------------------------------------------------------------------------------------    Allergies  Allergen Reactions  . Amoxicillin Hives and Itching     Current Outpatient Medications:  .  albuterol (PROAIR HFA) 108 (90 Base) MCG/ACT inhaler, Inhale 2 puffs into the lungs every 4 (four) hours as needed for wheezing or shortness of breath., Disp: 1 each, Rfl: 2 .  aspirin EC 81 MG tablet, Take 81 mg by mouth every other day., Disp: , Rfl:  .  BESIVANCE 0.6 % SUSP, Place 1 drop into the right eye 2 (two) times daily. , Disp: , Rfl:  .  busPIRone (BUSPAR) 30 MG tablet, Take 1 tablet (30 mg total) by mouth 2 (two) times daily., Disp: 60 tablet, Rfl: 11 .  CALCIUM CARBONATE-VIT D-MIN PO, Take 1 tablet by mouth 2 (two) times daily. , Disp: , Rfl:  .  clotrimazole-betamethasone (LOTRISONE) cream, LOTRISONE, 1-0.05% (External Cream)  1 (one) Cream Cream Apply twice daily to affected area.  Not for Internal use.  (Correction from prev script said not for external use and should read , not for internal use) for 0 days  Quantity: 45;  Refills: 1   Ordered :22-Apr-2013  Renaldo Fiddler ;  Started 22-Apr-2013 Active, Disp: , Rfl:  .  DUREZOL 0.05 % EMUL, Place 1 drop into the right  eye 2 (two) times daily. , Disp: , Rfl:  .  escitalopram (LEXAPRO) 20 MG tablet, TAKE 1 TABLET BY MOUTH ONCE DAILY, Disp: 90 tablet, Rfl: 1 .  NON FORMULARY, , Disp: , Rfl:  .  omeprazole (PRILOSEC) 20 MG capsule, TAKE ONE CAPSULE BY MOUTH ONCE DAILY, Disp: 90 capsule, Rfl: 3 .  polyethylene glycol powder (GLYCOLAX/MIRALAX) powder, TAKE 17 GRAMS OF POWDER MIXED IN 8 OUNCES OF WATER BY MOUTH ONCE A DAY., Disp: 255 g, Rfl: 3 .  pravastatin (PRAVACHOL) 20 MG tablet, TAKE 1 TABLET BY MOUTH AT BEDTIME, Disp: 90 tablet, Rfl: 1 .  rOPINIRole (REQUIP) 3 MG tablet, Take 1 tablet (3 mg total) by mouth at bedtime., Disp: 90 tablet, Rfl: 1  Review of Systems  Constitutional: Negative.   Respiratory: Negative.   Cardiovascular: Negative.   Gastrointestinal: Negative.   Genitourinary: Negative.   Musculoskeletal: Negative.   Neurological: Negative.     Social History   Tobacco Use  . Smoking status: Current Every Day Smoker    Packs/day: 1.25    Years: 55.00    Pack years: 68.75    Types: Cigarettes  . Smokeless tobacco: Never Used  . Tobacco comment: used to use E-cig  Substance Use Topics  . Alcohol use: Yes    Alcohol/week: 0.0 oz    Comment: occasionally- wine   Objective:   BP 108/80 (BP Location: Left Arm, Patient  Position: Sitting, Cuff Size: Large)   Pulse 98   Temp 98.3 F (36.8 C) (Oral)   Resp 16   Wt 205 lb (93 kg)   SpO2 98%   BMI 28.59 kg/m  Vitals:   06/30/17 1111  BP: 108/80  Pulse: 98  Resp: 16  Temp: 98.3 F (36.8 C)  TempSrc: Oral  SpO2: 98%  Weight: 205 lb (93 kg)     Physical Exam  Constitutional: She appears well-developed and well-nourished. No distress.  Neck: Normal range of motion. Neck supple. No JVD present. No tracheal deviation present. No thyromegaly present.  Cardiovascular: Normal rate, regular rhythm and normal heart sounds. Exam reveals no gallop and no friction rub.  No murmur heard. Pulmonary/Chest: Effort normal and breath sounds  normal. No respiratory distress. She has no wheezes. She has no rales.  Abdominal: Soft. Bowel sounds are normal. She exhibits no abdominal bruit and no pulsatile midline mass.  Lymphadenopathy:    She has no cervical adenopathy.  Skin: She is not diaphoretic.  Vitals reviewed.       Assessment & Plan:     1. Dehydration Improved. Continue to push fluids.   2. Encounter for smoking cessation counseling Will restart chantix as below as patient has used this somewhat successfully to quit smoking previously. Only reason she quit chantix last time before completing was due to cost. She is willing to try again and reports "cost of medication is probably less than I am spending on cigarettes and it will be better for me." This was sent as below.  - varenicline (CHANTIX PAK) 0.5 MG X 11 & 1 MG X 42 tablet; Take one 0.5 mg tablet PO once daily for 3 days, then increase to one 0.5 mg tablet BID for 4 days, then increase to one 1 mg tablet BID.  Dispense: 53 tablet; Refill: 0  3. Abdominal aortic aneurysm (AAA) 3.0 cm to 5.0 cm in diameter in female [facility]) Incidentally found on CT Abd/Pelvis in ER. Small measuring 3cm on CT. Noted to aortic atherosclerosis associated. Patient denies any mesenteric ischemia symptoms or claudication. Referral placed to Dr. Ronalee Belts (has seen him previously for venous insuff) for further evaluation and most likely annual followings.  - Ambulatory referral to Vascular Surgery  4. Abdominal aortic atherosclerosis ([place]) See above medical treatment plan. - Ambulatory referral to Vascular Surgery       Mar Daring, PA-C  [facility]

## 2017-06-30 NOTE — Patient Instructions (Signed)
Steps to Quit Smoking Smoking tobacco can be bad for your health. It can also affect almost every organ in your body. Smoking puts you and people around you at risk for many serious long-lasting (chronic) diseases. Quitting smoking is hard, but it is one of the best things that you can do for your health. It is never too late to quit. What are the benefits of quitting smoking? When you quit smoking, you lower your risk for getting serious diseases and conditions. They can include:  Lung cancer or lung disease.  Heart disease.  Stroke.  Heart attack.  Not being able to have children (infertility).  Weak bones (osteoporosis) and broken bones (fractures).  If you have coughing, wheezing, and shortness of breath, those symptoms may get better when you quit. You may also get sick less often. If you are pregnant, quitting smoking can help to lower your chances of having a baby of low birth weight. What can I do to help me quit smoking? Talk with your doctor about what can help you quit smoking. Some things you can do (strategies) include:  Quitting smoking totally, instead of slowly cutting back how much you smoke over a period of time.  Going to in-person counseling. You are more likely to quit if you go to many counseling sessions.  Using resources and support systems, such as: ? Online chats with a counselor. ? Phone quitlines. ? Printed self-help materials. ? Support groups or group counseling. ? Text messaging programs. ? Mobile phone apps or applications.  Taking medicines. Some of these medicines may have nicotine in them. If you are pregnant or breastfeeding, do not take any medicines to quit smoking unless your doctor says it is okay. Talk with your doctor about counseling or other things that can help you.  Talk with your doctor about using more than one strategy at the same time, such as taking medicines while you are also going to in-person counseling. This can help make  quitting easier. What things can I do to make it easier to quit? Quitting smoking might feel very hard at first, but there is a lot that you can do to make it easier. Take these steps:  Talk to your family and friends. Ask them to support and encourage you.  Call phone quitlines, reach out to support groups, or work with a counselor.  Ask people who smoke to not smoke around you.  Avoid places that make you want (trigger) to smoke, such as: ? Bars. ? Parties. ? Smoke-break areas at work.  Spend time with people who do not smoke.  Lower the stress in your life. Stress can make you want to smoke. Try these things to help your stress: ? Getting regular exercise. ? Deep-breathing exercises. ? Yoga. ? Meditating. ? Doing a body scan. To do this, close your eyes, focus on one area of your body at a time from head to toe, and notice which parts of your body are tense. Try to relax the muscles in those areas.  Download or buy apps on your mobile phone or tablet that can help you stick to your quit plan. There are many free apps, such as QuitGuide from the CDC (Centers for Disease Control and Prevention). You can find more support from smokefree.gov and other websites.  This information is not intended to replace advice given to you by your health care provider. Make sure you discuss any questions you have with your health care provider. Document Released: 02/16/2009 Document   Revised: 12/19/2015 Document Reviewed: 09/06/2014 Elsevier Interactive Patient Education  2018 Elsevier Inc.  

## 2017-07-24 ENCOUNTER — Ambulatory Visit (INDEPENDENT_AMBULATORY_CARE_PROVIDER_SITE_OTHER): Payer: Medicare Other | Admitting: Vascular Surgery

## 2017-07-24 ENCOUNTER — Encounter (INDEPENDENT_AMBULATORY_CARE_PROVIDER_SITE_OTHER): Payer: Self-pay | Admitting: Vascular Surgery

## 2017-07-24 VITALS — BP 118/74 | HR 94 | Resp 15 | Ht 70.0 in | Wt 204.0 lb

## 2017-07-24 DIAGNOSIS — J449 Chronic obstructive pulmonary disease, unspecified: Secondary | ICD-10-CM

## 2017-07-24 DIAGNOSIS — K219 Gastro-esophageal reflux disease without esophagitis: Secondary | ICD-10-CM | POA: Diagnosis not present

## 2017-07-24 DIAGNOSIS — I714 Abdominal aortic aneurysm, without rupture, unspecified: Secondary | ICD-10-CM

## 2017-07-24 DIAGNOSIS — I700 Atherosclerosis of aorta: Secondary | ICD-10-CM

## 2017-07-24 DIAGNOSIS — I872 Venous insufficiency (chronic) (peripheral): Secondary | ICD-10-CM | POA: Diagnosis not present

## 2017-07-26 ENCOUNTER — Encounter (INDEPENDENT_AMBULATORY_CARE_PROVIDER_SITE_OTHER): Payer: Self-pay | Admitting: Vascular Surgery

## 2017-07-26 NOTE — Progress Notes (Signed)
MRN : [id number]  Cathy Watts is a 72 y.o. (24-Mar-1946) female who presents with chief complaint of  Chief Complaint  Patient presents with  . Follow-up    AAA   .  History of Present Illness: The patient returns to the office for surveillance of a known abdominal aortic aneurysm. Patient denies abdominal pain or back pain, no other abdominal complaints. No changes suggesting embolic episodes.   There have been no interval changes in the patient's overall health care since his last visit.  Patient denies amaurosis fugax or TIA symptoms. There is no history of claudication or rest pain symptoms of the lower extremities. The patient denies angina or shortness of breath.     Current Meds  Medication Sig  . albuterol (PROAIR HFA) 108 (90 Base) MCG/ACT inhaler Inhale 2 puffs into the lungs every 4 (four) hours as needed for wheezing or shortness of breath.  Marland Kitchen aspirin EC 81 MG tablet Take 81 mg by mouth every other day.  Marland Kitchen BESIVANCE 0.6 % SUSP Place 1 drop into the right eye 2 (two) times daily.   . busPIRone (BUSPAR) 30 MG tablet Take 1 tablet (30 mg total) by mouth 2 (two) times daily.  Marland Kitchen CALCIUM CARBONATE-VIT D-MIN PO Take 1 tablet by mouth 2 (two) times daily.   . clotrimazole-betamethasone (LOTRISONE) cream LOTRISONE, 1-0.05% (External Cream)  1 (one) Cream Cream Apply twice daily to affected area.  Not for Internal use.  (Correction from prev script said not for external use and should read , not for internal use) for 0 days  Quantity: 45;  Refills: 1   Ordered :22-Apr-2013  Renaldo Fiddler ;  Started 22-Apr-2013 Active  . DUREZOL 0.05 % EMUL Place 1 drop into the right eye 2 (two) times daily.   Marland Kitchen escitalopram (LEXAPRO) 20 MG tablet TAKE 1 TABLET BY MOUTH ONCE DAILY  . NON FORMULARY   . omeprazole (PRILOSEC) 20 MG capsule TAKE ONE CAPSULE BY MOUTH ONCE DAILY  . polyethylene glycol powder (GLYCOLAX/MIRALAX) powder TAKE 17 GRAMS OF POWDER MIXED IN 8 OUNCES OF WATER BY  MOUTH ONCE A DAY.  . pravastatin (PRAVACHOL) 20 MG tablet TAKE 1 TABLET BY MOUTH AT BEDTIME  . rOPINIRole (REQUIP) 3 MG tablet Take 1 tablet (3 mg total) by mouth at bedtime.  . varenicline (CHANTIX PAK) 0.5 MG X 11 & 1 MG X 42 tablet Take one 0.5 mg tablet PO once daily for 3 days, then increase to one 0.5 mg tablet BID for 4 days, then increase to one 1 mg tablet BID.    Past Medical History:  Diagnosis Date  . Anxiety   . Asthma   . Barrett's esophagus   . COPD (chronic obstructive pulmonary disease) ([place])   . Depression   . GERD (gastroesophageal reflux disease)   . History of peptic ulcer disease   . Low oxygen saturation    2l hs  . Osteopenia   . Panic disorder   . Personal history of tobacco use, presenting hazards to health 01/16/2015  . Sleep apnea   . SOB (shortness of breath)   . Squamous cell carcinoma of right lung [facility])     Past Surgical History:  Procedure Laterality Date  . CERVICAL CONE BIOPSY  1990  . CHOLECYSTECTOMY  1980's  . colonoscopy  2014  . ELBOW FRACTURE SURGERY Right 2009  . ENDOBRONCHIAL ULTRASOUND N/A 02/07/2015   Procedure: ENDOBRONCHIAL ULTRASOUND;  Surgeon: Flora Lipps, MD;  Location: [facility] ORS;  Service:  Cardiopulmonary;  Laterality: N/A;  . ESOPHAGOGASTRODUODENOSCOPY (EGD) WITH PROPOFOL N/A 02/26/2016   Procedure: ESOPHAGOGASTRODUODENOSCOPY (EGD) WITH PROPOFOL;  Surgeon: Lollie Sails, MD;  Location: [facility] ENDOSCOPY;  Service: Endoscopy;  Laterality: N/A;  COPD, Sleep Apnea  . TONSILLECTOMY  1979  . TUBAL LIGATION  1970's  . UPPER GI ENDOSCOPY      Social History Social History   Tobacco Use  . Smoking status: Current Some Day Smoker    Packs/day: 1.25    Years: 55.00    Pack years: 68.75    Types: Cigarettes  . Smokeless tobacco: Never Used  . Tobacco comment: used to use E-cig  Substance Use Topics  . Alcohol use: Yes    Alcohol/week: 0.0 oz    Comment: occasionally- wine  . Drug use: No    Family History Family History    Problem Relation Age of Onset  . Colon cancer Mother        colon  . Diabetes Mother   . Arthritis Mother   . Glaucoma Mother   . Stomach cancer Father        stomach  . Throat cancer Brother        throat  . Breast cancer Sister        breast  . Diabetes Sister   . Cirrhosis Sister   . Cancer Paternal Aunt   . Parkinson's disease Sister   . Heart attack Sister   . Ovarian cancer Sister     Allergies  Allergen Reactions  . Amoxicillin Hives and Itching     REVIEW OF SYSTEMS (Negative unless checked)  Constitutional: [] Weight loss  [] Fever  [] Chills Cardiac: [] Chest pain   [] Chest pressure   [] Palpitations   [] Shortness of breath when laying flat   [] Shortness of breath with exertion. Vascular:  [] Pain in legs with walking   [] Pain in legs at rest  [] History of DVT   [] Phlebitis   [] Swelling in legs   [] Varicose veins   [] Non-healing ulcers Pulmonary:   [] Uses home oxygen   [] Productive cough   [] Hemoptysis   [] Wheeze  [] COPD   [] Asthma Neurologic:  [] Dizziness   [] Seizures   [] History of stroke   [] History of TIA  [] Aphasia   [] Vissual changes   [] Weakness or numbness in arm   [] Weakness or numbness in leg Musculoskeletal:   [] Joint swelling   [] Joint pain   [] Low back pain Hematologic:  [] Easy bruising  [] Easy bleeding   [] Hypercoagulable state   [] Anemic Gastrointestinal:  [] Diarrhea   [] Vomiting  [] Gastroesophageal reflux/heartburn   [] Difficulty swallowing. Genitourinary:  [] Chronic kidney disease   [] Difficult urination  [] Frequent urination   [] Blood in urine Skin:  [] Rashes   [] Ulcers  Psychological:  [] History of anxiety   []  History of major depression.  Physical Examination  Vitals:   07/24/17 1117  BP: 118/74  Pulse: 94  Resp: 15  Weight: 204 lb (92.5 kg)  Height: 5\' 10"  (1.778 m)   Body mass index is 29.27 kg/m. Gen: WD/WN, NAD Head: [place]/AT, No temporalis wasting.  Ear/Nose/Throat: Hearing grossly intact, nares w/o erythema or drainage Eyes: PER,  EOMI, sclera nonicteric.  Neck: Supple, no large masses.   Pulmonary:  Good air movement, no audible wheezing bilaterally, no use of accessory muscles.  Cardiac: RRR, no JVD Vascular:  Vessel Right Left  Radial Palpable Palpable  PT Palpable Palpable  DP Palpable Palpable  Gastrointestinal: Non-distended. No guarding/no peritoneal signs.  Musculoskeletal: M/S 5/5 throughout.  No deformity or atrophy.  Neurologic: CN 2-12 intact. Symmetrical.  Speech is fluent. Motor exam as listed above. Psychiatric: Judgment intact, Mood & affect appropriate for pt's clinical situation. Dermatologic: No rashes or ulcers noted.  No changes consistent with cellulitis. Lymph : No lichenification or skin changes of chronic lymphedema.  CBC Lab Results  Component Value Date   WBC 11.0 06/26/2017   HGB 14.5 06/26/2017   HCT 43.0 06/26/2017   MCV 91.2 06/26/2017   PLT 251 06/26/2017    BMET    Component Value Date/Time   NA 137 06/26/2017 2329   NA 142 05/02/2016 1437   K 3.5 06/26/2017 2329   CL 102 06/26/2017 2329   CO2 23 06/26/2017 2329   GLUCOSE 150 (H) 06/26/2017 2329   BUN 16 06/26/2017 2329   BUN 15 05/02/2016 1437   CREATININE 0.80 06/26/2017 2329   CALCIUM 8.8 (L) 06/26/2017 2329   GFRNONAA >60 06/26/2017 2329   GFRAA >60 06/26/2017 2329   CrCl cannot be calculated (Patient's most recent lab result is older than the maximum 21 days allowed.).  COAG No results found for: INR, PROTIME  Radiology Dg Chest 2 View  Result Date: 06/26/2017 CLINICAL DATA:  Weakness and near syncope EXAM: CHEST  2 VIEW COMPARISON:  CT chest 04/16/2017, 10/21/2016, 03/20/2016 PET-CT 02/07/2015, radiograph 11/16/2014 FINDINGS: No pleural effusion. Diffuse bronchitic changes. Asymmetrically dense right hilum, appears somewhat increased compared to scout images from previous chest CT. Normal heart size. Aortic atherosclerosis. No pneumothorax. Surgical clips in the right upper quadrant. Chronic compression  deformity of a midthoracic vertebra IMPRESSION: 1. No acute infiltrate or effusion. There are diffuse bronchitic changes 2. Asymmetrically dense right hilum, possibly increased compared to prior scout images from previous chest CTs; CT follow-up with contrast suggested to exclude recurrent hilar mass. Electronically Signed   By: Donavan Foil M.D.   On: 06/26/2017 23:54   Ct Head Wo Contrast  Result Date: 06/27/2017 CLINICAL DATA:  Near-syncope, vomiting. Weakness. Treated for pneumonia 2 weeks ago. History of lung cancer. EXAM: CT HEAD WITHOUT CONTRAST TECHNIQUE: Contiguous axial images were obtained from the base of the skull through the vertex without intravenous contrast. COMPARISON:  CT HEAD Sep 18, 2016 FINDINGS: BRAIN: No intraparenchymal hemorrhage, mass effect nor midline shift. The ventricles and sulci are normal for age. Patchy supratentorial white matter hypodensities less than expected patient's age, though non-specific are most compatible with chronic small vessel ischemic disease. No acute large vascular territory infarcts. No abnormal extra-axial fluid collections. Basal cisterns are patent. Borderline partially empty sella. VASCULAR: Trace calcific atherosclerosis of the carotid siphons. SKULL: No skull fracture. No significant scalp soft tissue swelling. SINUSES/ORBITS: Minimal bilateral mastoid effusion without air cell coalescence. Included paranasal sinuses are well aerated. The included ocular globes and orbital contents are non-suspicious. Status post bilateral ocular lens implants. OTHER: None. IMPRESSION: Negative noncontrast CT HEAD for age. Electronically Signed   By: Elon Alas M.D.   On: 06/27/2017 00:38   Ct Abdomen Pelvis W Contrast  Result Date: 06/27/2017 CLINICAL DATA:  72 year old female with history of lung cancer. Concern for abdominal infection. EXAM: CT ABDOMEN AND PELVIS WITH CONTRAST TECHNIQUE: Multidetector CT imaging of the abdomen and pelvis was performed  using the standard protocol following bolus administration of intravenous contrast. CONTRAST:  15mL ISOVUE-300 IOPAMIDOL (ISOVUE-300) INJECTION 61% COMPARISON:  PET-CT dated 02/07/2015 FINDINGS: Lower chest: The visualized lung bases are clear. No intra-abdominal free air or free fluid. Hepatobiliary: Cholecystectomy. There is mild intrahepatic biliary ductal dilatation. The common bile  duct is also mildly dilated and measures 12 mm in diameter. A 9 mm low attenuating lesion in the left lobe of the liver is not well characterized but likely represents a cyst. Pancreas: Unremarkable. No pancreatic ductal dilatation or surrounding inflammatory changes. Spleen: Normal in size without focal abnormality. Adrenals/Urinary Tract: The adrenal glands are unremarkable. The kidneys, visualized ureters, and urinary bladder are unremarkable. There is symmetric enhancement and excretion of contrast by the kidneys. Stomach/Bowel: There is sigmoid diverticulosis without active inflammatory changes. No bowel obstruction or active inflammation. Normal appendix. Vascular/Lymphatic: There is advanced aortoiliac atherosclerotic disease. The aorta is tortuous. There is a 3 cm infrarenal abdominal aortic aneurysm. Reproductive: The uterus is anteverted and grossly unremarkable. No pelvic mass. Other: No abdominal wall hernia or abnormality. No abdominopelvic ascites. Musculoskeletal: There is degenerative changes of the spine. No acute osseous pathology. There is grade 1 L5-S1 anterolisthesis. IMPRESSION: 1. No acute intra-abdominal or pelvic pathology. 2. Cholecystectomy with mild dilatation of the biliary trees, likely post cholecystectomy. 3. No bowel obstruction or active inflammation.  Normal appendix 4. A 3 cm infrarenal abdominal aortic aneurysm as well as Aortic Atherosclerosis (ICD10-I70.0). Electronically Signed   By: Anner Crete M.D.   On: 06/27/2017 00:52     Assessment/Plan 1. Abdominal aortic aneurysm (AAA) 3.0 cm  to 5.0 cm in diameter in female [facility]) No surgery or intervention at this time. The patient has an asymptomatic abdominal aortic aneurysm that is less than 4 cm in maximal diameter.  I have discussed the natural history of abdominal aortic aneurysm and the small risk of rupture for aneurysm less than 5 cm in size.  However, as these small aneurysms tend to enlarge over time, continued surveillance with ultrasound or CT scan is mandatory.  I have also discussed optimizing medical management with hypertension and lipid control and the importance of abstinence from tobacco.  The patient is also encouraged to exercise a minimum of 30 minutes 4 times a week.  Should the patient develop new onset abdominal or back pain or signs of peripheral embolization they are instructed to seek medical attention immediately and to alert the physician providing care that they have an aneurysm.  The patient voices their understanding. The patient will return in 12 months with an aortic duplex.  - VAS US AORTA/IVC/ILIACS; Future  2. Abdominal aortic atherosclerosis (HCC)  Recommend:  The patient has evidence of atherosclerosis of the lower extremities with claudication.  The patient does not voice lifestyle limiting changes at this point in time.  Noninvasive studies do not suggest clinically significant change.  No invasive studies, angiography or surgery at this time The patient should continue walking and begin a more formal exercise program.  The patient should continue antiplatelet therapy and aggressive treatment of the lipid abnormalities  No changes in the patient's medications at this time  The patient should continue wearing graduated compression socks 10-15 mmHg strength to control the mild edema.    3. Chronic venous insufficiency No surgery or intervention at this point in time.    I have had a long discussion with the patient regarding venous insufficiency and why it  causes symptoms. I have  discussed with the patient the chronic skin changes that accompany venous insufficiency and the long term sequela such as infection and ulceration.  Patient will begin wearing graduated compression stockings class 1 (20-30 mmHg) or compression wraps on a daily basis a prescription was given. The patient will put the stockings on first thing in the  morning and removing them in the evening. The patient is instructed specifically not to sleep in the stockings.    In addition, behavioral modification including several periods of elevation of the lower extremities during the day will be continued. I have demonstrated that proper elevation is a position with the ankles at heart level.  The patient is instructed to begin routine exercise, especially walking on a daily basis   4. COPD, moderate ([place]) Continue pulmonary medications and aerosols as already ordered, these medications have been reviewed and there are no changes at this time.    5. Gastroesophageal reflux disease, esophagitis presence not specified Continue antihypertensive medications as already ordered, these medications have been reviewed and there are no changes at this time.  Avoidence of caffeine and alcohol  Moderate elevation of the head of the bed     Hortencia Pilar, MD  07/26/2017 2:40 PM

## 2017-08-01 ENCOUNTER — Other Ambulatory Visit: Payer: Self-pay

## 2017-08-01 DIAGNOSIS — Z716 Tobacco abuse counseling: Secondary | ICD-10-CM

## 2017-08-01 MED ORDER — VARENICLINE TARTRATE 1 MG PO TABS: 1.0000 mg | ORAL_TABLET | Freq: Two times a day (BID) | ORAL | 2 refills | Status: DC

## 2017-08-07 NOTE — Telephone Encounter (Signed)
complete

## 2017-09-02 ENCOUNTER — Other Ambulatory Visit: Payer: Self-pay | Admitting: Physician Assistant

## 2017-09-02 DIAGNOSIS — E7800 Pure hypercholesterolemia, unspecified: Secondary | ICD-10-CM

## 2017-09-25 ENCOUNTER — Other Ambulatory Visit: Payer: Self-pay | Admitting: Physician Assistant

## 2017-09-25 DIAGNOSIS — K219 Gastro-esophageal reflux disease without esophagitis: Secondary | ICD-10-CM

## 2017-09-25 MED ORDER — OMEPRAZOLE 20 MG PO CPDR: 20.0000 mg | DELAYED_RELEASE_CAPSULE | Freq: Every day | ORAL | 3 refills | Status: DC

## 2017-09-25 NOTE — Telephone Encounter (Signed)
Pt needs refill on   Omeprazole 10 mg   [place]  [employer]

## 2017-10-24 ENCOUNTER — Inpatient Hospital Stay: Payer: Medicare Other | Admitting: Oncology

## 2017-10-24 ENCOUNTER — Inpatient Hospital Stay: Payer: Medicare Other | Attending: Oncology

## 2017-10-24 ENCOUNTER — Ambulatory Visit
Admission: RE | Admit: 2017-10-24 | Discharge: 2017-10-24 | Disposition: A | Discharge status: Home / Self Care | Payer: Medicare Other | Source: Ambulatory Visit | Attending: Internal Medicine | Admitting: Internal Medicine

## 2017-10-24 VITALS — BP 109/74 | HR 93 | Temp 98.3°F | Resp 16 | Wt 204.0 lb

## 2017-10-24 DIAGNOSIS — C3431 Malignant neoplasm of lower lobe, right bronchus or lung: Secondary | ICD-10-CM | POA: Diagnosis not present

## 2017-10-24 DIAGNOSIS — E7800 Pure hypercholesterolemia, unspecified: Secondary | ICD-10-CM | POA: Insufficient documentation

## 2017-10-24 DIAGNOSIS — Z720 Tobacco use: Secondary | ICD-10-CM

## 2017-10-24 DIAGNOSIS — J432 Centrilobular emphysema: Secondary | ICD-10-CM | POA: Insufficient documentation

## 2017-10-24 DIAGNOSIS — M4854XA Collapsed vertebra, not elsewhere classified, thoracic region, initial encounter for fracture: Secondary | ICD-10-CM | POA: Insufficient documentation

## 2017-10-24 DIAGNOSIS — R0602 Shortness of breath: Secondary | ICD-10-CM | POA: Diagnosis not present

## 2017-10-24 DIAGNOSIS — Z85118 Personal history of other malignant neoplasm of bronchus and lung: Secondary | ICD-10-CM | POA: Insufficient documentation

## 2017-10-24 DIAGNOSIS — Z923 Personal history of irradiation: Secondary | ICD-10-CM

## 2017-10-24 DIAGNOSIS — Z87891 Personal history of nicotine dependence: Secondary | ICD-10-CM | POA: Diagnosis not present

## 2017-10-24 DIAGNOSIS — I2510 Atherosclerotic heart disease of native coronary artery without angina pectoris: Secondary | ICD-10-CM | POA: Insufficient documentation

## 2017-10-24 DIAGNOSIS — J449 Chronic obstructive pulmonary disease, unspecified: Secondary | ICD-10-CM | POA: Diagnosis not present

## 2017-10-24 DIAGNOSIS — I700 Atherosclerosis of aorta: Secondary | ICD-10-CM | POA: Insufficient documentation

## 2017-10-24 LAB — COMPREHENSIVE METABOLIC PANEL
ALBUMIN: 3.8 g/dL (ref 3.5–5.0)
ALT: 12 U/L — ABNORMAL LOW (ref 14–54)
AST: 16 U/L (ref 15–41)
Alkaline Phosphatase: 52 U/L (ref 38–126)
Anion gap: 6 (ref 5–15)
BUN: 15 mg/dL (ref 6–20)
CALCIUM: 9.6 mg/dL (ref 8.9–10.3)
CO2: 26 mmol/L (ref 22–32)
Chloride: 104 mmol/L (ref 101–111)
Creatinine, Ser: 0.65 mg/dL (ref 0.44–1.00)
GFR calc non Af Amer: >60 mL/min (ref >60)
GLUCOSE: 122 mg/dL — AB (ref 65–99)
POTASSIUM: 3.9 mmol/L (ref 3.5–5.1)
SODIUM: 136 mmol/L (ref 135–145)
Total Bilirubin: 0.3 mg/dL (ref 0.3–1.2)
Total Protein: 6.9 g/dL (ref 6.5–8.1)

## 2017-10-24 LAB — CBC WITH DIFFERENTIAL/PLATELET
BASOS PCT: 3 %
Basophils Absolute: 0.3 10*3/uL — ABNORMAL HIGH (ref 0–0.1)
EOS ABS: 0.1 10*3/uL (ref 0–0.7)
EOS PCT: 2 %
HCT: 41 % (ref 35.0–47.0)
Hemoglobin: 13.9 g/dL (ref 12.0–16.0)
LYMPHS ABS: 1.3 10*3/uL (ref 1.0–3.6)
Lymphocytes Relative: 13 %
MCH: 31.2 pg (ref 26.0–34.0)
MCHC: 33.8 g/dL (ref 32.0–36.0)
MCV: 92.3 fL (ref 80.0–100.0)
MONO ABS: 0.8 10*3/uL (ref 0.2–0.9)
MONOS PCT: 8 %
Neutro Abs: 7.2 10*3/uL — ABNORMAL HIGH (ref 1.4–6.5)
Neutrophils Relative %: 74 %
PLATELETS: 306 10*3/uL (ref 150–440)
RBC: 4.45 MIL/uL (ref 3.80–5.20)
RDW: 13.8 % (ref 11.5–14.5)
WBC: 9.7 10*3/uL (ref 3.6–11.0)

## 2017-10-24 LAB — LIPID PANEL
CHOL/HDL RATIO: 3.2 ratio
CHOLESTEROL: 141 mg/dL (ref 0–200)
HDL: 44 mg/dL (ref >40)
LDL Cholesterol: 81 mg/dL (ref 0–99)
TRIGLYCERIDES: 79 mg/dL (ref <150)
VLDL: 16 mg/dL (ref 0–40)

## 2017-10-24 LAB — POCT I-STAT CREATININE: CREATININE: 0.7 mg/dL (ref 0.44–1.00)

## 2017-10-24 MED ORDER — IOPAMIDOL (ISOVUE-300) INJECTION 61%
75.0000 mL | Freq: Once | INTRAVENOUS | Status: AC | PRN
  Administered 2017-10-24: 75 mL via INTRAVENOUS

## 2017-10-24 NOTE — Assessment & Plan Note (Addendum)
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   Continue surveillance with CT scans every 6 months for the  2 to 3 years and then on annual basis for 4 to 5 years.  Recent CT scan from 10/24/2017 reveals no evidence of metastatic or recurrent disease.  Previously mentioned T7 compression fracture: From fall.  Patient states this resolved.  Not visible on new imaging.  Spoke at length about smoking cessation.  States she recently started smoking d/t financial and family concerns.  RTC in 6 months with labs, MD assessment and CT scan.  Orders placed.

## 2017-10-24 NOTE — Progress Notes (Signed)
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 NOV 15th CT NED  # COPD on 02 at night; hx of smoking.      Cancer of lower lobe of right lung ([place])    HISTORY OF PRESENTING ILLNESS:  Patient presents for follow-up of squamous cell lung cancer stage I. States she is been doing well. She denies any cough, hemoptysis, headache, vision changes or bony pain.  Admits to continued shortness of breath but this is not any worse.  She does admit to recently beginning to smoke again.  ROS:   Review of Systems  Constitutional: Negative.  Negative for chills, fever, malaise/fatigue and weight loss.  HENT: Negative for congestion and ear pain.   Eyes: Negative.  Negative for blurred vision and double vision.  Respiratory: Positive for cough (Chronic) and shortness of breath (Chronic). Negative for sputum production.   Cardiovascular: Negative.  Negative for chest pain, palpitations and leg swelling.  Gastrointestinal: Negative.  Negative for abdominal pain, constipation, diarrhea, nausea and vomiting.  Genitourinary: Negative for dysuria, frequency and urgency.  Musculoskeletal: Negative for back pain and falls.  Skin: Negative.  Negative for rash.  Neurological: Negative.  Negative for weakness and headaches.  Endo/Heme/Allergies: Negative.  Does not bruise/bleed easily.  Psychiatric/Behavioral: Negative.  Negative for depression. The patient is not nervous/anxious and does not have insomnia.      MEDICAL HISTORY:  Past Medical History:  Diagnosis Date  . Anxiety   . Asthma   . Barrett's esophagus   . COPD  (chronic obstructive pulmonary disease) ([place])   . Depression   . GERD (gastroesophageal reflux disease)   . History of peptic ulcer disease   . Low oxygen saturation    2l hs  . Osteopenia   . Panic disorder   . Personal history of tobacco use, presenting hazards to health 01/16/2015  . Sleep apnea   . SOB (shortness of breath)   . Squamous cell carcinoma of right lung ([place])     SURGICAL HISTORY: Past Surgical History:  Procedure Laterality Date  . CERVICAL CONE BIOPSY  1990  . CHOLECYSTECTOMY  1980's  . colonoscopy  2014  . ELBOW FRACTURE SURGERY Right 2009  . ENDOBRONCHIAL ULTRASOUND N/A 02/07/2015   Procedure: ENDOBRONCHIAL ULTRASOUND;  Surgeon: Flora Lipps, MD;  Location: [facility] ORS;  Service: Cardiopulmonary;  Laterality: N/A;  . ESOPHAGOGASTRODUODENOSCOPY (EGD) WITH PROPOFOL N/A 02/26/2016   Procedure: ESOPHAGOGASTRODUODENOSCOPY (EGD) WITH PROPOFOL;  Surgeon: Lollie Sails, MD;  Location: [facility] ENDOSCOPY;  Service: Endoscopy;  Laterality: N/A;  COPD, Sleep Apnea  . TONSILLECTOMY  1979  . TUBAL LIGATION  1970's  . UPPER GI ENDOSCOPY      SOCIAL HISTORY: Social History   Socioeconomic History  . Marital status: Significant Other    Spouse name: Not on file  . Number of children: 3  . Years of education: Not on file  . Highest education level: 12th grade  Occupational History  . Occupation: retired  Scientific laboratory technician  . Financial resource strain: Not hard at all  . Food insecurity:    Worry: Never true    Inability: Never true  .  Transportation needs:    Medical: No    Non-medical: No  Tobacco Use  . Smoking status: Current Some Day Smoker    Packs/day: 1.25    Years: 55.00    Pack years: 68.75    Types: Cigarettes  . Smokeless tobacco: Never Used  . Tobacco comment: used to use E-cig  Substance and Sexual Activity  . Alcohol use: Yes    Alcohol/week: 0.0 oz    Comment: occasionally- wine  . Drug use: No  . Sexual activity: Not on file  Lifestyle  .  Physical activity:    Days per week: Not on file    Minutes per session: Not on file  . Stress: Not at all  Relationships  . Social connections:    Talks on phone: Not on file    Gets together: Not on file    Attends religious service: Not on file    Active member of club or organization: Not on file    Attends meetings of clubs or organizations: Not on file    Relationship status: Not on file  . Intimate partner violence:    Fear of current or ex partner: No    Emotionally abused: No    Physically abused: No    Forced sexual activity: No  Other Topics Concern  . Not on file  Social History Narrative  . Not on file    FAMILY HISTORY: Family History  Problem Relation Age of Onset  . Colon cancer Mother        colon  . Diabetes Mother   . Arthritis Mother   . Glaucoma Mother   . Stomach cancer Father        stomach  . Throat cancer Brother        throat  . Breast cancer Sister        breast  . Diabetes Sister   . Cirrhosis Sister   . Cancer Paternal Aunt   . Parkinson's disease Sister   . Heart attack Sister   . Ovarian cancer Sister     ALLERGIES:  is allergic to amoxicillin.  MEDICATIONS:  Current Outpatient Medications  Medication Sig Dispense Refill  . albuterol (PROAIR HFA) 108 (90 Base) MCG/ACT inhaler Inhale 2 puffs into the lungs every 4 (four) hours as needed for wheezing or shortness of breath. 1 each 2  . aspirin EC 81 MG tablet Take 81 mg by mouth every other day.    Marland Kitchen BESIVANCE 0.6 % SUSP Place 1 drop into the right eye 2 (two) times daily.     . busPIRone (BUSPAR) 30 MG tablet Take 1 tablet (30 mg total) by mouth 2 (two) times daily. 60 tablet 11  . CALCIUM CARBONATE-VIT D-MIN PO Take 1 tablet by mouth 2 (two) times daily.     . clotrimazole-betamethasone (LOTRISONE) cream LOTRISONE, 1-0.05% (External Cream)  1 (one) Cream Cream Apply twice daily to affected area.  Not for Internal use.  (Correction from prev script said not for external use and  should read , not for internal use) for 0 days  Quantity: 45;  Refills: 1   Ordered :22-Apr-2013  Renaldo Fiddler ;  Started 22-Apr-2013 Active    . DUREZOL 0.05 % EMUL Place 1 drop into the right eye 2 (two) times daily.     Marland Kitchen escitalopram (LEXAPRO) 20 MG tablet TAKE 1 TABLET BY MOUTH ONCE DAILY 90 tablet 1  . NON FORMULARY     . omeprazole (PRILOSEC) 20 MG capsule Take  1 capsule (20 mg total) by mouth daily. 90 capsule 3  . polyethylene glycol powder (GLYCOLAX/MIRALAX) powder TAKE 17 GRAMS OF POWDER MIXED IN 8 OUNCES OF WATER BY MOUTH ONCE A DAY. 255 g 3  . pravastatin (PRAVACHOL) 20 MG tablet TAKE 1 TABLET BY MOUTH AT BEDTIME 90 tablet 1  . rOPINIRole (REQUIP) 3 MG tablet Take 1 tablet (3 mg total) by mouth at bedtime. 90 tablet 1  . varenicline (CHANTIX CONTINUING MONTH PAK) 1 MG tablet Take 1 tablet (1 mg total) by mouth 2 (two) times daily. 60 tablet 2   No current facility-administered medications for this visit.     PHYSICAL EXAMINATION: ECOG PERFORMANCE STATUS: 1 - Symptomatic but completely ambulatory  There were no vitals filed for this visit. There were no vitals filed for this visit.  Physical Exam  Constitutional: She is oriented to person, place, and time and well-developed, well-nourished, and in no distress. Vital signs are normal.  HENT:  Head: Normocephalic and atraumatic.  Eyes: Pupils are equal, round, and reactive to light.  Neck: Normal range of motion.  Cardiovascular: Normal rate, regular rhythm and normal heart sounds.  No murmur heard. Pulmonary/Chest: Effort normal and breath sounds normal. She has no wheezes.  Abdominal: Soft. Normal appearance and bowel sounds are normal. She exhibits no distension. There is no tenderness.  Musculoskeletal: Normal range of motion. She exhibits no edema.  Neurological: She is alert and oriented to person, place, and time. Gait normal.  Skin: Skin is warm and dry. No rash noted.  Psychiatric: Mood, memory, affect and  judgment normal.    LABORATORY DATA:  I have reviewed the data as listed Lab Results  Component Value Date   WBC 11.0 06/26/2017   HGB 14.5 06/26/2017   HCT 43.0 06/26/2017   MCV 91.2 06/26/2017   PLT 251 06/26/2017   Recent Labs    04/23/17 1358 06/26/17 2329 10/24/17 1032  NA 135 137  --   K 4.3 3.5  --   CL 98* 102  --   CO2 29 23  --   GLUCOSE 99 150*  --   BUN 22* 16  --   CREATININE 0.81 0.80 0.70  CALCIUM 9.6 8.8*  --   GFRNONAA >60 >60  --   GFRAA >60 >60  --   PROT 7.2 7.1  --   ALBUMIN 4.3 4.0  --   AST 19 19  --   ALT 20 15  --   ALKPHOS 56 56  --   BILITOT 0.6 0.3  --     RADIOGRAPHIC STUDIES: I have personally reviewed the radiological images as listed and agreed with the findings in the report. No results found. IMPRESSION: 1. Similar radiation changes in the right perihilar region. No evidence of local recurrence or metastatic disease. 2. Stable scattered pulmonary nodularity. 3. Aortic Atherosclerosis (ICD10-I70.0) and Emphysema (ICD10-J43.9).   Electronically Signed   By: Richardean Sale M.D.   On: 04/16/2017 15:27 ASSESSMENT & PLAN:   Cancer of lower lobe of right lung ([place]) # Squamous cell carcinoma of the lung stage I [ cT1aN0]; S/p definitive RT [Dec 2016];DEC 2018-NED.   # Discussed that I would recommend surveillance CT scans every 6 months or so for the first 2-3 years; and then on an annual basis for 4-5 years.  # right lower lobe- radiation changes stable/asymptomatic.   # T7 compression fracture likely traumatic. Fairly asymptomatic. Monitor for now.  #  Patient follow-up with [place] in  approximately 6 months; scan prior.    # I reviewed the images myself and with the patient   # 25 minutes face-to-face with the patient discussing the above plan of care; more than 50% of time spent on prognosis/ natural history; counseling and coordination.        Jacquelin Hawking, NP 10/24/2017 1:29 PM

## 2017-10-27 ENCOUNTER — Ambulatory Visit: Payer: Medicare Other

## 2017-11-04 ENCOUNTER — Other Ambulatory Visit: Payer: Self-pay | Admitting: Physician Assistant

## 2017-11-04 DIAGNOSIS — F419 Anxiety disorder, unspecified: Secondary | ICD-10-CM

## 2017-11-05 ENCOUNTER — Other Ambulatory Visit: Payer: Self-pay | Admitting: Physician Assistant

## 2017-11-05 DIAGNOSIS — F419 Anxiety disorder, unspecified: Secondary | ICD-10-CM

## 2017-11-20 ENCOUNTER — Encounter: Payer: Self-pay | Admitting: Internal Medicine

## 2017-11-20 ENCOUNTER — Ambulatory Visit: Payer: Medicare Other | Admitting: Internal Medicine

## 2017-11-20 VITALS — BP 110/66 | HR 101 | Ht 70.0 in | Wt 201.0 lb

## 2017-11-20 DIAGNOSIS — Z716 Tobacco abuse counseling: Secondary | ICD-10-CM

## 2017-11-20 DIAGNOSIS — J441 Chronic obstructive pulmonary disease with (acute) exacerbation: Secondary | ICD-10-CM

## 2017-11-20 DIAGNOSIS — F17210 Nicotine dependence, cigarettes, uncomplicated: Secondary | ICD-10-CM

## 2017-11-20 MED ORDER — PREDNISONE 20 MG PO TABS: 20.0000 mg | ORAL_TABLET | Freq: Every day | ORAL | 0 refills | Status: DC

## 2017-11-20 MED ORDER — AZITHROMYCIN 250 MG PO TABS: ORAL_TABLET | ORAL | 0 refills | Status: DC

## 2017-11-20 NOTE — Patient Instructions (Addendum)
Start Z pak Start Prednisone 20 mg daily for 10 days Continue Albuterol as needed  STOP SMOKING!!!!

## 2017-11-20 NOTE — Progress Notes (Signed)
   Subjective:    Patient ID: MICAL BRUN, female    DOB: 1946/02/27, 72 y.o.   MRN: [id number]  Synopsis: Gold Grade B with moderate airflow obstruction 2015, still smoking as of 05/2015 Recent DX of SQ cell Lung cancer  CC:  Follow up COPD   HPI  Patient still smokes +wheezing +productive cough Feels lethargic  CT and PET scan shows no residual cancer at previous visit  +signs of infection   Smoking Assessment and Cessation Counseling   Upon further questioning, Patient smokes 1/2 ppd  I have advised patient to quit/stop smoking as soon as possible due to high risk for multiple medical problems  Patient  is NOT willing to quit smoking  I have advised patient that we can assist and have options of Nicotine replacement therapy. I also advised patient on behavioral therapy and can provide oral medication therapy in conjunction with the other therapies  Follow up next Office visit  for assessment of smoking cessation  Smoking cessation counseling advised for 4 minutes    Review of Systems  Constitutional: Positive for fatigue. Negative for chills and fever.  HENT: Negative for congestion.   Respiratory: Positive for cough and wheezing. Negative for choking, chest tightness and shortness of breath.   Cardiovascular: Negative for chest pain and leg swelling.  Gastrointestinal: Negative.   All other systems reviewed and are negative.  BP 110/66 (BP Location: Left Arm, Cuff Size: Normal)   Pulse (!) 101   Ht 5\' 10"  (1.778 m)   Wt 201 lb (91.2 kg)   SpO2 95%   BMI 28.84 kg/m      Objective:   Physical Exam  Constitutional: She is oriented to person, place, and time. She appears well-developed and well-nourished. No distress.  HENT:  Head: Normocephalic and atraumatic.  Eyes: Pupils are equal, round, and reactive to light. EOM are normal.  Neck: Normal range of motion.  Cardiovascular: Normal rate, regular rhythm and normal heart sounds.  Pulmonary/Chest:  Effort normal. No stridor. No respiratory distress. She has wheezes. She has no rales.  Abdominal: Soft.  Neurological: She is alert and oriented to person, place, and time.  Skin: She is not diaphoretic.         Assessment & Plan:    72 yo white female SEP 2016- SQUAMOUS CELL LUNG CA STAGE IA with COPD  Gold Stage A with chronic hypoxic resp failure with acute COPD exacerbation with chronic hypoxic resp failure  1.Acute COPD exacerbation z pak and prednisone ordered  2.hold BREO and Incruse as per patient request, use albuterol as needed 3.continue oxygen therapy at night with 2 L [place] for chornic resp failure Patient uses and benefits oxygen therapy 4.follow up oncology as scheduled 5.smoking cessation strongly advised  Follow up in 6  Months  Patient satisfied with Plan of action and management. All questions answered  Corrin Parker, M.D.  [facility]  Medical Director [place] Director [facility] Cardio-Pulmonary Department

## 2017-12-08 ENCOUNTER — Telehealth: Payer: Self-pay | Admitting: Internal Medicine

## 2017-12-08 MED ORDER — PREDNISONE 20 MG PO TABS: 40.0000 mg | ORAL_TABLET | Freq: Every day | ORAL | 0 refills | Status: DC

## 2017-12-08 NOTE — Telephone Encounter (Signed)
Please prescribe Prednisone 40 mg daily for 7 days

## 2017-12-08 NOTE — Telephone Encounter (Signed)
Please call regarding pt last visit, pt would not give any more information.

## 2017-12-08 NOTE — Telephone Encounter (Signed)
Rx sent. Pt aware. Advised to call back if not better after completing prednisone. Nothing further needed.

## 2017-12-08 NOTE — Telephone Encounter (Signed)
Pt states she finished abx/prednisone She was feeling better, is now worse. She has increased sob and is weak. Please advise.    1.Acute COPD exacerbation z pak and prednisone ordered  2.hold BREO and Incruse as per patient request, use albuterol as needed 3.continue oxygen therapy at night with 2 L [place] for chornic resp failure Patient uses and benefits oxygen therapy 4.follow up oncology as scheduled 5.smoking cessation strongly advised

## 2017-12-24 ENCOUNTER — Telehealth: Payer: Self-pay | Admitting: Internal Medicine

## 2017-12-24 MED ORDER — VARENICLINE TARTRATE 0.5 MG X 11 & 1 MG X 42 PO MISC: ORAL | 0 refills | Status: DC

## 2017-12-24 NOTE — Telephone Encounter (Signed)
Patient is ready for Chantix rx to quit smoking.  Please send to [facility].

## 2017-12-24 NOTE — Telephone Encounter (Signed)
Rx chantix rx printed and placed in provider folder signature. Once signed will fax to [facility].

## 2018-01-28 ENCOUNTER — Encounter: Payer: Self-pay | Admitting: Internal Medicine

## 2018-01-28 ENCOUNTER — Telehealth: Payer: Self-pay | Admitting: Internal Medicine

## 2018-01-28 ENCOUNTER — Ambulatory Visit: Payer: Medicare Other | Admitting: Internal Medicine

## 2018-01-28 VITALS — BP 122/82 | HR 110 | Resp 16 | Ht 70.0 in | Wt 194.0 lb

## 2018-01-28 DIAGNOSIS — F17210 Nicotine dependence, cigarettes, uncomplicated: Secondary | ICD-10-CM | POA: Diagnosis not present

## 2018-01-28 DIAGNOSIS — J441 Chronic obstructive pulmonary disease with (acute) exacerbation: Secondary | ICD-10-CM

## 2018-01-28 MED ORDER — IPRATROPIUM-ALBUTEROL 0.5-2.5 (3) MG/3ML IN SOLN
3.0000 mL | Freq: Once | RESPIRATORY_TRACT | Status: AC
  Administered 2018-01-28: 3 mL via RESPIRATORY_TRACT

## 2018-01-28 MED ORDER — FLUTICASONE-UMECLIDIN-VILANT 100-62.5-25 MCG/INH IN AEPB: 1.0000 | INHALATION_SPRAY | Freq: Every day | RESPIRATORY_TRACT | 2 refills | Status: DC

## 2018-01-28 MED ORDER — PREDNISONE 10 MG (21) PO TBPK: ORAL_TABLET | ORAL | 0 refills | Status: DC

## 2018-01-28 MED ORDER — DOXYCYCLINE HYCLATE 100 MG PO CAPS: 100.0000 mg | ORAL_CAPSULE | Freq: Two times a day (BID) | ORAL | 0 refills | Status: DC

## 2018-01-28 NOTE — Addendum Note (Signed)
Addended by: Oscar La R on: 01/28/2018 04:26 PM   Modules accepted: Orders

## 2018-01-28 NOTE — Progress Notes (Signed)
Subjective:    Patient ID: Cathy Watts, female    DOB: [birth year], 72 y.o.   MRN: [id number]  Synopsis: Gold Grade B with moderate airflow obstruction 2015, still smoking as of 05/2015 Recent DX of SQ cell Lung cancer  CC:   Acute visit for cough and congestion.   HPI  The patient is a 71 year old female, she normally sees Dr. Mortimer Fries, she is here for an acute visit.  She has a history of stage Ia squamous cell lung cancer, she was treated with radiation, and is undergoing surveillance CT scanning.  She had a flareup of her cough and congestion about 6 weeks ago and received a course of prednisone and z pack. She got a bit better but now is feeling much worse.  Her breathing got worse 2 weeks ago, she was taking mucinex but did not help.  She had been on maintenance inhalers in the past, but she got better after she stopped smoking several months ago. She restarted smoking in about 11/2017 and has been feeling worse since that time.   She again stopped smoking about a week ago, she tried on chantix but became feeling "weird and moody" she also started having suicidal thoughts, she stopped it and felt back to herself.       Review of Systems  Constitutional: Positive for fatigue. Negative for chills.  HENT: Negative for congestion, ear discharge and ear pain.   Eyes: Negative for pain and itching.  Respiratory: Positive for cough, chest tightness, shortness of breath and wheezing. Negative for choking.   Cardiovascular: Negative for chest pain and leg swelling.  Gastrointestinal: Negative.   Endocrine: Negative for cold intolerance and heat intolerance.  Genitourinary: Negative for difficulty urinating and dysuria.  Musculoskeletal: Positive for neck pain and neck stiffness.  Neurological: Positive for weakness. Negative for dizziness and seizures.  Psychiatric/Behavioral: Negative for sleep disturbance and suicidal ideas.  All other systems reviewed and are negative.  Resp 16    Ht 5\' 10"  (1.778 m)   Wt 194 lb (88 kg)   BMI 27.84 kg/m      Objective:   Physical Exam  Constitutional: She is oriented to person, place, and time. She appears well-developed and well-nourished. No distress.  HENT:  Head: Normocephalic and atraumatic.  Eyes: Pupils are equal, round, and reactive to light. EOM are normal. Right eye exhibits no discharge. Scleral icterus is present.  Neck: Normal range of motion. No tracheal deviation present. Thyromegaly present.  Cardiovascular: Normal rate, regular rhythm and normal heart sounds.  Pulmonary/Chest: Effort normal. No stridor. She has wheezes. She has no rales.  Abdominal: Soft. She exhibits no distension. There is no tenderness.  Musculoskeletal: She exhibits no edema or deformity.  Neurological: She is alert and oriented to person, place, and time. No cranial nerve deficit. Coordination normal.  Skin: No rash noted. She is not diaphoretic. No erythema.         Assessment & Plan:    72 yo white female SEP 2016- SQUAMOUS CELL LUNG CA STAGE IA with COPD  Gold Stage A with chronic hypoxic resp failure with acute COPD exacerbation with chronic hypoxic resp failure  1.acute exacerbation with acute bronchitis. -We will send in prescription for doxycycline, prednisone taper. -We will give her a DuoNeb treatment today.  2.we will restart patient on maintenance inhaler, she started on trilogy inhaler today. 3.continue oxygen therapy at night with 2 L [place] for chornic resp failure Patient uses and benefits oxygen therapy 4.follow  up oncology as scheduled 5.discussed the importance of smoke cessation today, her breathing had improved when she stopped smoking, but then got worse again when she restarted.  Smoking cessation going to be very important going forwards for her.  4 minutes spent discussion.  Follow up in 2 months with Dr. Mortimer Fries.   Marda Stalker, M.D., F.C.C.P.  Board Certified in Internal Medicine, Pulmonary Medicine,  [place], and Sleep Medicine.  [place] [facility] Office Number: [phone]

## 2018-01-28 NOTE — Telephone Encounter (Signed)
Returned call to patient. Explained why she hadn't received a call back yet. Offered appt this pm. Patient scheduled @ 3;45.

## 2018-01-28 NOTE — Telephone Encounter (Signed)
Patient calling to check on status Patient would like to speak with nurse even without hearing from provider Please call

## 2018-01-28 NOTE — Addendum Note (Signed)
Addended by: Stephanie Coup on: 01/28/2018 04:40 PM   Modules accepted: Orders

## 2018-01-28 NOTE — Patient Instructions (Signed)
Will start trelegy inhaler, one puff once daily.  Will give prescription for antibiotic and prednisone taper.

## 2018-01-28 NOTE — Telephone Encounter (Signed)
Pt c/o Shortness Of Breath: STAT if SOB developed within the last 24 hours or pt is noticeably SOB on the phone  1. Are you currently SOB (can you hear that pt is SOB on the phone)? yes  2. How long have you been experiencing SOB? 2 weeks   3. Are you SOB when sitting or when up moving around? All the time   4. Are you currently experiencing any other symptoms? Green productive cough feels like passing out on exertion

## 2018-01-30 ENCOUNTER — Other Ambulatory Visit: Payer: Self-pay | Admitting: Internal Medicine

## 2018-01-30 MED ORDER — FLUTICASONE-UMECLIDIN-VILANT 100-62.5-25 MCG/INH IN AEPB: 1.0000 | INHALATION_SPRAY | Freq: Every day | RESPIRATORY_TRACT | 3 refills | Status: DC

## 2018-01-30 NOTE — Telephone Encounter (Signed)
Rx printed and placed in provider folder for signature.

## 2018-02-02 ENCOUNTER — Other Ambulatory Visit: Payer: Self-pay | Admitting: Internal Medicine

## 2018-02-02 MED ORDER — FLUTICASONE-UMECLIDIN-VILANT 100-62.5-25 MCG/INH IN AEPB: 1.0000 | INHALATION_SPRAY | Freq: Every day | RESPIRATORY_TRACT | 3 refills | Status: DC

## 2018-02-02 NOTE — Telephone Encounter (Signed)
Patient came by office to pick up prescription Checking to see if she will be able to get another sample Patient is a Psychologist, occupational, she will check back later today to see if samples are available

## 2018-02-19 ENCOUNTER — Other Ambulatory Visit: Payer: Self-pay | Admitting: Physician Assistant

## 2018-02-19 DIAGNOSIS — Z1231 Encounter for screening mammogram for malignant neoplasm of breast: Secondary | ICD-10-CM

## 2018-03-10 ENCOUNTER — Other Ambulatory Visit: Payer: Self-pay | Admitting: Physician Assistant

## 2018-03-10 DIAGNOSIS — G2581 Restless legs syndrome: Secondary | ICD-10-CM

## 2018-03-10 DIAGNOSIS — E7800 Pure hypercholesterolemia, unspecified: Secondary | ICD-10-CM

## 2018-03-13 ENCOUNTER — Ambulatory Visit
Admission: RE | Admit: 2018-03-13 | Discharge: 2018-03-13 | Disposition: A | Discharge status: Home / Self Care | Payer: Medicare Other | Source: Ambulatory Visit | Attending: Physician Assistant | Admitting: Physician Assistant

## 2018-03-13 DIAGNOSIS — Z1231 Encounter for screening mammogram for malignant neoplasm of breast: Secondary | ICD-10-CM

## 2018-03-13 HISTORY — DX: Personal history of irradiation: Z92.3

## 2018-03-16 ENCOUNTER — Telehealth: Payer: Self-pay

## 2018-03-16 NOTE — Telephone Encounter (Signed)
Patient stated she received results off of her mychart.

## 2018-03-16 NOTE — Telephone Encounter (Signed)
-----   Message from Mar Daring, PA-C sent at 03/16/2018  8:45 AM EST ----- Normal mammogram. Repeat screening in one year.

## 2018-03-31 ENCOUNTER — Ambulatory Visit: Payer: Medicare Other | Admitting: Internal Medicine

## 2018-03-31 ENCOUNTER — Encounter: Payer: Self-pay | Admitting: Internal Medicine

## 2018-03-31 VITALS — BP 110/80 | HR 89 | Resp 16 | Ht 70.0 in | Wt 210.0 lb

## 2018-03-31 DIAGNOSIS — J449 Chronic obstructive pulmonary disease, unspecified: Secondary | ICD-10-CM

## 2018-03-31 MED ORDER — FLUTICASONE-UMECLIDIN-VILANT 100-62.5-25 MCG/INH IN AEPB: 1.0000 | INHALATION_SPRAY | Freq: Every day | RESPIRATORY_TRACT | 0 refills | Status: DC

## 2018-03-31 MED ORDER — FLUTTER DEVI: 1.0000 | Freq: Four times a day (QID) | 0 refills | Status: AC

## 2018-03-31 NOTE — Progress Notes (Signed)
   Subjective:    Patient ID: CLOVER FEEHAN, female    DOB: [birth year], 72 y.o.   MRN: [id number]  Synopsis: Gold Grade B with moderate airflow obstruction 2015, still smoking as of 05/2015 Recent DX of SQ cell Lung cancer Dx of COPD  CC:  Follow up COPD    HPI  Has Chronic SOB and DOE Quit smoking 3 months ago feels better with breathing  No signs of infection No signs of exacerbation   Follow up CT chest pending    Review of Systems  Constitutional: Negative for chills, fatigue and fever.  HENT: Negative for congestion.   Respiratory: Negative for cough, choking, chest tightness, shortness of breath and wheezing.   Cardiovascular: Negative for chest pain and leg swelling.  Gastrointestinal: Negative.   All other systems reviewed and are negative.  BP 110/80 (BP Location: Left Arm, Cuff Size: Large)   Pulse 89   Resp 16   Ht 5\' 10"  (1.778 m)   Wt 210 lb (95.3 kg)   SpO2 96%   BMI 30.13 kg/m       Objective:   Physical Exam  Constitutional: She is oriented to person, place, and time. She appears well-developed and well-nourished. No distress.  HENT:  Head: Normocephalic and atraumatic.  Eyes: Pupils are equal, round, and reactive to light. EOM are normal.  Neck: Normal range of motion.  Cardiovascular: Normal rate, regular rhythm and normal heart sounds.  Pulmonary/Chest: Effort normal. No stridor. No respiratory distress. She has no wheezes. She has no rales.  Abdominal: Soft.  Neurological: She is alert and oriented to person, place, and time.  Skin: She is not diaphoretic.         Assessment & Plan:   72 yo white female with previous h/o Stage 1A SQ cell lung cancer with COPD Gold STage A with chronic hypoxic resp failure  COPD Gold stage A Stable at this time Continue triple therapy inhaler Albuterol as needed  Chronic hypoxic respiratory failure from COPD Continue oxygen as prescribed Patient uses and benefits from therapy  History of  lung cancer  ollow-up CT chest pending next month  Chest congestion Advised to use flutter valve 10-15 times per day  Recommend continuing of smoking cessation Flu shot up-to-date  Patient satisfied with Plan of action and management. All questions answered Follow-up 1 year  Shanti Eichel Patricia Pesa, M.D.  [facility]  Medical Director [place] Director [place] Department

## 2018-03-31 NOTE — Patient Instructions (Signed)
Start Flutter Valve 10 times per day  Continue inhalers as prescribed     CONGRATULATIONS!!!!!!!!! YOU STOPPED SMOKING!!!

## 2018-04-09 ENCOUNTER — Telehealth: Payer: Self-pay

## 2018-04-09 NOTE — Telephone Encounter (Signed)
LMTCB with daughter [name]. Called to schedule AWV. Please send her call to me to be scheduled.  -MM

## 2018-04-13 NOTE — Telephone Encounter (Signed)
Scheduled AWV for 05/11/18 @ 1:20 PM. -MM

## 2018-04-13 NOTE — Telephone Encounter (Signed)
Patient is returning your call. KW

## 2018-04-22 ENCOUNTER — Telehealth: Payer: Self-pay | Admitting: Internal Medicine

## 2018-04-22 MED ORDER — AZITHROMYCIN 250 MG PO TABS: ORAL_TABLET | ORAL | 0 refills | Status: AC

## 2018-04-22 MED ORDER — PREDNISONE 20 MG PO TABS: 40.0000 mg | ORAL_TABLET | Freq: Every day | ORAL | 0 refills | Status: AC

## 2018-04-22 NOTE — Telephone Encounter (Signed)
Prednisone 40 mg daily for 7 days z pak

## 2018-04-22 NOTE — Telephone Encounter (Signed)
Pt notified. Nothing needed.

## 2018-04-22 NOTE — Telephone Encounter (Signed)
Rx Prednisone/Z pak has been sent to [facility]. Left message to call back.

## 2018-04-27 ENCOUNTER — Ambulatory Visit
Admission: RE | Admit: 2018-04-27 | Discharge: 2018-04-27 | Disposition: A | Discharge status: Home / Self Care | Payer: Medicare Other | Source: Ambulatory Visit | Attending: Oncology | Admitting: Oncology

## 2018-04-27 DIAGNOSIS — C3431 Malignant neoplasm of lower lobe, right bronchus or lung: Secondary | ICD-10-CM | POA: Diagnosis not present

## 2018-04-27 LAB — POCT I-STAT CREATININE: Creatinine, Ser: 0.8 mg/dL (ref 0.44–1.00)

## 2018-04-27 MED ORDER — IOHEXOL 300 MG/ML  SOLN
75.0000 mL | Freq: Once | INTRAMUSCULAR | Status: AC | PRN
  Administered 2018-04-27: 75 mL via INTRAVENOUS

## 2018-04-27 MED ORDER — IOPAMIDOL (ISOVUE-300) INJECTION 61%: 75.0000 mL | Freq: Once | INTRAVENOUS | Status: DC | PRN

## 2018-04-27 NOTE — Progress Notes (Signed)
Cathy Watts will see her on Friday.   Faythe Casa, NP 04/27/2018 9:13 AM

## 2018-05-01 ENCOUNTER — Inpatient Hospital Stay: Payer: Medicare Other

## 2018-05-01 ENCOUNTER — Ambulatory Visit (INDEPENDENT_AMBULATORY_CARE_PROVIDER_SITE_OTHER): Payer: Medicare Other | Admitting: Physician Assistant

## 2018-05-01 ENCOUNTER — Inpatient Hospital Stay: Payer: Medicare Other | Admitting: Internal Medicine

## 2018-05-01 ENCOUNTER — Encounter: Payer: Self-pay | Admitting: Physician Assistant

## 2018-05-01 VITALS — BP 126/83 | HR 86 | Temp 98.1°F | Resp 16 | Ht 70.0 in | Wt 217.8 lb

## 2018-05-01 DIAGNOSIS — I714 Abdominal aortic aneurysm, without rupture, unspecified: Secondary | ICD-10-CM

## 2018-05-01 DIAGNOSIS — C3431 Malignant neoplasm of lower lobe, right bronchus or lung: Secondary | ICD-10-CM | POA: Diagnosis not present

## 2018-05-01 DIAGNOSIS — K2100 Gastro-esophageal reflux disease with esophagitis, without bleeding: Secondary | ICD-10-CM

## 2018-05-01 DIAGNOSIS — J449 Chronic obstructive pulmonary disease, unspecified: Secondary | ICD-10-CM | POA: Diagnosis not present

## 2018-05-01 DIAGNOSIS — Z0000 Encounter for general adult medical examination without abnormal findings: Secondary | ICD-10-CM | POA: Diagnosis not present

## 2018-05-01 DIAGNOSIS — F3341 Major depressive disorder, recurrent, in partial remission: Secondary | ICD-10-CM

## 2018-05-01 DIAGNOSIS — K2270 Barrett's esophagus without dysplasia: Secondary | ICD-10-CM

## 2018-05-01 DIAGNOSIS — Z1239 Encounter for other screening for malignant neoplasm of breast: Secondary | ICD-10-CM | POA: Diagnosis not present

## 2018-05-01 DIAGNOSIS — F17200 Nicotine dependence, unspecified, uncomplicated: Secondary | ICD-10-CM

## 2018-05-01 DIAGNOSIS — Z780 Asymptomatic menopausal state: Secondary | ICD-10-CM

## 2018-05-01 DIAGNOSIS — E559 Vitamin D deficiency, unspecified: Secondary | ICD-10-CM

## 2018-05-01 DIAGNOSIS — B379 Candidiasis, unspecified: Secondary | ICD-10-CM

## 2018-05-01 DIAGNOSIS — M810 Age-related osteoporosis without current pathological fracture: Secondary | ICD-10-CM

## 2018-05-01 MED ORDER — BUPROPION HCL ER (XL) 150 MG PO TB24: 150.0000 mg | ORAL_TABLET | Freq: Every day | ORAL | 1 refills | Status: DC

## 2018-05-01 MED ORDER — CLOTRIMAZOLE-BETAMETHASONE 1-0.05 % EX CREA: TOPICAL_CREAM | CUTANEOUS | 1 refills | Status: DC

## 2018-05-01 MED ORDER — OMEPRAZOLE 40 MG PO CPDR: 40.0000 mg | DELAYED_RELEASE_CAPSULE | Freq: Every day | ORAL | 1 refills | Status: DC

## 2018-05-01 NOTE — Patient Instructions (Signed)
Health Maintenance After Age 72 After age 72, you are at a higher risk for certain long-term diseases and infections as well as injuries from falls. Falls are a major cause of broken bones and head injuries in people who are older than age 72. Getting regular preventive care can help to keep you healthy and well. Preventive care includes getting regular testing and making lifestyle changes as recommended by your health care provider. Talk with your health care provider about:  Which screenings and tests you should have. A screening is a test that checks for a disease when you have no symptoms.  A diet and exercise plan that is right for you. What should I know about screenings and tests to prevent falls? Screening and testing are the best ways to find a health problem early. Early diagnosis and treatment give you the best chance of managing medical conditions that are common after age 72. Certain conditions and lifestyle choices may make you more likely to have a fall. Your health care provider may recommend:  Regular vision checks. Poor vision and conditions such as cataracts can make you more likely to have a fall. If you wear glasses, make sure to get your prescription updated if your vision changes.  Medicine review. Work with your health care provider to regularly review all of the medicines you are taking, including over-the-counter medicines. Ask your health care provider about any side effects that may make you more likely to have a fall. Tell your health care provider if any medicines that you take make you feel dizzy or sleepy.  Osteoporosis screening. Osteoporosis is a condition that causes the bones to get weaker. This can make the bones weak and cause them to break more easily.  Blood pressure screening. Blood pressure changes and medicines to control blood pressure can make you feel dizzy.  Strength and balance checks. Your health care provider may recommend certain tests to check your  strength and balance while standing, walking, or changing positions.  Foot health exam. Foot pain and numbness, as well as not wearing proper footwear, can make you more likely to have a fall.  Depression screening. You may be more likely to have a fall if you have a fear of falling, feel emotionally low, or feel unable to do activities that you used to do.  Alcohol use screening. Using too much alcohol can affect your balance and may make you more likely to have a fall. What actions can I take to lower my risk of falls? General instructions  Talk with your health care provider about your risks for falling. Tell your health care provider if: ? You fall. Be sure to tell your health care provider about all falls, even ones that seem minor. ? You feel dizzy, sleepy, or off-balance.  Take over-the-counter and prescription medicines only as told by your health care provider. These include any supplements.  Eat a healthy diet and maintain a healthy weight. A healthy diet includes low-fat dairy products, low-fat (lean) meats, and fiber from whole grains, beans, and lots of fruits and vegetables. Home safety  Remove any tripping hazards, such as rugs, cords, and clutter.  Install safety equipment such as grab bars in bathrooms and safety rails on stairs.  Keep rooms and walkways well-lit. Activity   Follow a regular exercise program to stay fit. This will help you maintain your balance. Ask your health care provider what types of exercise are appropriate for you.  If you need a cane or   walker, use it as recommended by your health care provider.  Wear supportive shoes that have nonskid soles. Lifestyle  Do not drink alcohol if your health care provider tells you not to drink.  If you drink alcohol, limit how much you have: ? 0-1 drink a day for women. ? 0-2 drinks a day for men.  Be aware of how much alcohol is in your drink. In the [place] one drink equals one typical bottle of beer (12  oz), one-half glass of wine (5 oz), or one shot of hard liquor (1 oz).  Do not use any products that contain nicotine or tobacco, such as cigarettes and e-cigarettes. If you need help quitting, ask your health care provider. Summary  Having a healthy lifestyle and getting preventive care can help to protect your health and wellness after age 72.  Screening and testing are the best way to find a health problem early and help you avoid having a fall. Early diagnosis and treatment give you the best chance for managing medical conditions that are more common for people who are older than age 72.  Falls are a major cause of broken bones and head injuries in people who are older than age 72. Take precautions to prevent a fall at home.  Work with your health care provider to learn what changes you can make to improve your health and wellness and to prevent falls. This information is not intended to replace advice given to you by your health care provider. Make sure you discuss any questions you have with your health care provider. Document Released: 03/05/2017 Document Revised: 03/05/2017 Document Reviewed: 03/05/2017 Elsevier Interactive Patient Education  2019 Elsevier Inc.  

## 2018-05-01 NOTE — Progress Notes (Signed)
Patient: Cathy Watts, Female    DOB: Jul 17, 1945, 72 y.o.   MRN: [id number] Visit Date: 05/01/2018  Today's Provider: Mar Daring, PA-C   Chief Complaint  Patient presents with  . Medicare Wellness   Subjective:    Annual wellness visit Cathy Watts is a 72 y.o. female. She feels well. She reports exercising none. She reports she is sleeping well. 04/28/17 AWE 03/13/18 Mammogram-BI-RADS 1 07/21/12 Colonoscopy-recheck in 5 yrs, appt 05/12/2018 with Dr. Gustavo Lah. Also having EGD for Barrett's esophagus repeated this year.  Patient reports she has quit smoking 4 months ago.  -----------------------------------------------------------   Review of Systems  Constitutional: Negative.   HENT: Positive for congestion.   Eyes: Negative.   Respiratory: Positive for cough, shortness of breath and wheezing.   Cardiovascular: Positive for leg swelling.  Gastrointestinal: Negative.   Endocrine: Positive for polyphagia.  Genitourinary: Positive for enuresis and urgency.  Musculoskeletal: Positive for back pain.  Skin: Negative.   Allergic/Immunologic: Negative.   Neurological: Negative.   Hematological: Negative.   Psychiatric/Behavioral: Positive for agitation. The patient is nervous/anxious.     Social History   Socioeconomic History  . Marital status: Significant Other    Spouse name: Not on file  . Number of children: 3  . Years of education: Not on file  . Highest education level: 12th grade  Occupational History  . Occupation: retired  Scientific laboratory technician  . Financial resource strain: Not hard at all  . Food insecurity:    Worry: Never true    Inability: Never true  . Transportation needs:    Medical: No    Non-medical: No  Tobacco Use  . Smoking status: Former Smoker    Packs/day: 1.25    Years: 55.00    Pack years: 68.75    Types: Cigarettes  . Smokeless tobacco: Never Used  . Tobacco comment: used to use E-cig  Substance and Sexual  Activity  . Alcohol use: Yes    Alcohol/week: 0.0 standard drinks    Comment: occasionally- wine  . Drug use: No  . Sexual activity: Not on file  Lifestyle  . Physical activity:    Days per week: Not on file    Minutes per session: Not on file  . Stress: Not at all  Relationships  . Social connections:    Talks on phone: Not on file    Gets together: Not on file    Attends religious service: Not on file    Active member of club or organization: Not on file    Attends meetings of clubs or organizations: Not on file    Relationship status: Not on file  . Intimate partner violence:    Fear of current or ex partner: No    Emotionally abused: No    Physically abused: No    Forced sexual activity: No  Other Topics Concern  . Not on file  Social History Narrative  . Not on file    Past Medical History:  Diagnosis Date  . Anxiety   . Asthma   . Barrett's esophagus   . COPD (chronic obstructive pulmonary disease) ([place])   . Depression   . GERD (gastroesophageal reflux disease)   . History of peptic ulcer disease   . Low oxygen saturation    2l hs  . Osteopenia   . Panic disorder   . Personal history of radiation therapy   . Personal history of tobacco use, presenting hazards to health  01/16/2015  . Sleep apnea   . SOB (shortness of breath)   . Squamous cell carcinoma of right lung [facility])      Patient Active Problem List   Diagnosis Date Noted  . Abdominal aortic aneurysm (AAA) 3.0 cm to 5.0 cm in diameter in female ([place]) 06/30/2017  . Abdominal aortic atherosclerosis ([employer]) 06/30/2017  . Chronic venous insufficiency 04/11/2016  . Varicose veins of bilateral lower extremities with pain 03/12/2016  . Cancer of lower lobe of right lung ([place]) 10/09/2015  . Oropharyngeal candidiasis 07/12/2015  . Personal history of tobacco use, presenting hazards to health 01/16/2015  . Airway hyperreactivity 11/15/2014  . CN (constipation) 11/15/2014  . Daytime somnolence 11/15/2014  .  Clinical depression 11/15/2014  . DD (diverticular disease) 11/15/2014  . Accumulation of fluid in tissues 11/15/2014  . H/O peptic ulcer 11/15/2014  . Osteopenia 11/15/2014  . Awareness of heartbeats 11/15/2014  . Episodic paroxysmal anxiety disorder 11/15/2014  . Apnea, sleep 11/15/2014  . Snores 11/15/2014  . Breath shortness 11/15/2014  . Compulsive tobacco user syndrome 11/15/2014  . Avitaminosis D 11/15/2014  . Barrett esophagus 10/19/2014  . Acid reflux 10/19/2014  . COPD, moderate ([place]) 08/31/2013  . Dyspnea 08/31/2013  . Otitis, externa, infective 08/31/2013  . Nocturnal hypoxemia 08/31/2013  . Hypercholesterolemia without hypertriglyceridemia 11/05/2005    Past Surgical History:  Procedure Laterality Date  . CERVICAL CONE BIOPSY  1990  . CHOLECYSTECTOMY  1980's  . colonoscopy  2014  . ELBOW FRACTURE SURGERY Right 2009  . ENDOBRONCHIAL ULTRASOUND N/A 02/07/2015   Procedure: ENDOBRONCHIAL ULTRASOUND;  Surgeon: Flora Lipps, MD;  Location: [facility] ORS;  Service: Cardiopulmonary;  Laterality: N/A;  . ESOPHAGOGASTRODUODENOSCOPY (EGD) WITH PROPOFOL N/A 02/26/2016   Procedure: ESOPHAGOGASTRODUODENOSCOPY (EGD) WITH PROPOFOL;  Surgeon: Lollie Sails, MD;  Location: [facility] ENDOSCOPY;  Service: Endoscopy;  Laterality: N/A;  COPD, Sleep Apnea  . TONSILLECTOMY  1979  . TUBAL LIGATION  1970's  . UPPER GI ENDOSCOPY      Her family history includes Arthritis in her mother; Breast cancer in her sister; Cancer in her paternal aunt; Cirrhosis in her sister; Colon cancer in her mother; Diabetes in her mother and sister; Glaucoma in her mother; Heart attack in her sister; Ovarian cancer in her sister; Parkinson's disease in her sister; Stomach cancer in her father; Throat cancer in her brother.      Current Outpatient Medications:  .  albuterol (PROAIR HFA) 108 (90 Base) MCG/ACT inhaler, Inhale 2 puffs into the lungs every 4 (four) hours as needed for wheezing or shortness of breath., Disp:  1 each, Rfl: 2 .  busPIRone (BUSPAR) 30 MG tablet, TAKE 1 TABLET BY MOUTH TWICE DAILY, Disp: 60 tablet, Rfl: 11 .  CALCIUM CARBONATE-VIT D-MIN PO, Take 1 tablet by mouth 2 (two) times daily. , Disp: , Rfl:  .  clotrimazole-betamethasone (LOTRISONE) cream, LOTRISONE, 1-0.05% (External Cream)  1 (one) Cream Cream Apply twice daily to affected area.  Not for Internal use.  (Correction from prev script said not for external use and should read , not for internal use) for 0 days  Quantity: 45;  Refills: 1   Ordered :22-Apr-2013  Renaldo Fiddler ;  Started 22-Apr-2013 Active, Disp: , Rfl:  .  escitalopram (LEXAPRO) 20 MG tablet, TAKE 1 TABLET BY MOUTH ONCE DAILY, Disp: 90 tablet, Rfl: 1 .  Fluticasone-Umeclidin-Vilant (TRELEGY ELLIPTA) 100-62.5-25 MCG/INH AEPB, Inhale 1 puff into the lungs daily., Disp: 180 each, Rfl: 3 .  Magnesium 500 MG CAPS, Take  1 capsule by mouth at bedtime., Disp: , Rfl:  .  Omega-3 Fatty Acids (FISH OIL) 600 MG CAPS, Take by mouth 2 (two) times daily., Disp: , Rfl:  .  omeprazole (PRILOSEC) 20 MG capsule, Take 1 capsule (20 mg total) by mouth daily., Disp: 90 capsule, Rfl: 3 .  polyethylene glycol powder (GLYCOLAX/MIRALAX) powder, TAKE 17 GRAMS OF POWDER MIXED IN 8 OUNCES OF WATER BY MOUTH ONCE A DAY., Disp: 255 g, Rfl: 3 .  pravastatin (PRAVACHOL) 20 MG tablet, TAKE 1 TABLET BY MOUTH AT BEDTIME, Disp: 90 tablet, Rfl: 1 .  Respiratory Therapy Supplies (FLUTTER) DEVI, 1 each by Does not apply route QID., Disp: 1 each, Rfl: 0 .  rOPINIRole (REQUIP) 3 MG tablet, TAKE 1 TABLET BY MOUTH AT BEDTIME, Disp: 90 tablet, Rfl: 1  Patient Care Team: Mar Daring, [name], MD as Consulting Physician (Ophthalmology)     Objective:   Vitals: BP 126/83 (BP Location: Left Arm, Patient Position: Sitting, Cuff Size: Normal)   Pulse 86   Temp 98.1 F (36.7 C) (Oral)   Resp 16   Ht 5\' 10"  (1.778 m)   Wt 217 lb 12.8 oz (98.8 kg)   BMI  31.25 kg/m   Physical Exam Vitals signs reviewed.  Constitutional:      General: She is not in acute distress.    Appearance: Normal appearance. She is well-developed. She is obese. She is not diaphoretic.  HENT:     Head: Normocephalic and atraumatic.     Right Ear: Hearing, tympanic membrane, ear canal and external ear normal.     Left Ear: Hearing, tympanic membrane, ear canal and external ear normal.     Nose: Nose normal.     Mouth/Throat:     Lips: Pink.     Mouth: Mucous membranes are moist.     Pharynx: Oropharynx is clear. Uvula midline. No oropharyngeal exudate or posterior oropharyngeal erythema.  Eyes:     General: No scleral icterus.       Right eye: No discharge.        Left eye: No discharge.     Conjunctiva/sclera: Conjunctivae normal.     Pupils: Pupils are equal, round, and reactive to light.  Neck:     Musculoskeletal: Normal range of motion and neck supple.     Thyroid: No thyromegaly.     Vascular: No carotid bruit or JVD.     Trachea: No tracheal deviation.  Cardiovascular:     Rate and Rhythm: Normal rate and regular rhythm.     Heart sounds: Normal heart sounds. No murmur. No friction rub. No gallop.   Pulmonary:     Effort: Pulmonary effort is normal. No respiratory distress.     Breath sounds: Normal breath sounds. No wheezing or rales.  Chest:     Chest wall: No tenderness.  Abdominal:     General: Bowel sounds are normal. There is no distension.     Palpations: Abdomen is soft. There is no mass.     Tenderness: There is no abdominal tenderness. There is no guarding or rebound.  Musculoskeletal: Normal range of motion.        General: No tenderness.  Lymphadenopathy:     Cervical: No cervical adenopathy.  Skin:    General: Skin is warm and dry.     Findings: No rash.  Neurological:     Mental Status: She is alert and oriented to person, place, and  time.  Psychiatric:        Behavior: Behavior normal.        Thought Content: Thought content  normal.        Judgment: Judgment normal.     Activities of Daily Living No flowsheet data found.  Fall Risk Assessment Fall Risk  05/26/2017 04/24/2017 05/13/2016 05/13/2016 04/24/2016  Falls in the past year? No No No No No     Depression Screen PHQ 2/9 Scores 05/26/2017 04/24/2017 05/13/2016 04/24/2016  PHQ - 2 Score 0 1 0 0    6CIT Screen 05/01/2018  What Year? 0 points  What month? 0 points  What time? 0 points  Count back from 20 0 points  Months in reverse 0 points  Repeat phrase 0 points  Total Score 0     Assessment & Plan:     Annual Wellness Visit  Reviewed patient's Family Medical History Reviewed and updated list of patient's medical providers Assessment of cognitive impairment was done Assessed patient's functional ability Established a written schedule for health screening [place] Completed and Reviewed  Exercise Activities and Dietary recommendations Goals    . DIET - EAT MORE FRUITS AND VEGETABLES     Recommend eating 2 servings of vegetables and fruits a day. Substitute for junk food.        Immunization History  Administered Date(s) Administered  . Influenza Split 04/11/2011, 04/15/2012, 04/02/2013  . Influenza, High Dose Seasonal PF 04/24/2016, 04/24/2017  . Influenza, Seasonal, Injecte, Preservative Fre 04/25/2015  . Influenza,inj,Quad PF,6+ Mos 04/20/2013  . Influenza-Unspecified 02/08/2014  . Pneumococcal Conjugate-13 04/22/2014  . Pneumococcal Polysaccharide-23 04/20/2013  . Pneumococcal-Unspecified 04/02/2013, 04/10/2014  . Tdap 11/24/2009  . Zoster 07/19/2013    Health Maintenance  Topic Date Due  . DEXA SCAN  05/07/2016  . COLONOSCOPY  07/21/2017  . TETANUS/TDAP  11/25/2019  . MAMMOGRAM  03/13/2020  . INFLUENZA VACCINE  Completed  . Hepatitis C Screening  Completed  . PNA vac Low Risk Adult  Completed     Discussed health benefits of physical activity, and encouraged her to engage in regular exercise  appropriate for her age and condition.    1. Annual physical exam Normal physical exam today. Will check labs as below and f/u pending lab results. If labs are stable and WNL she will not need to have these rechecked for one year at her next annual physical exam. She is to call the office in the meantime if she has any acute issue, questions or concerns.  2. Breast cancer screening Mammogram done on 03/13/18 normal.   3. Gastroesophageal reflux disease with esophagitis Worsening. Seeing Dr. Gustavo Lah in the coming weeks to schedule Colonoscopy and Endoscopy for barrett's esophagus. Will increase omeprazole to 40mg  once daily from 20mg  once daily. Call if no improvements. May reuire BID dosing.  - omeprazole (PRILOSEC) 40 MG capsule; Take 1 capsule (40 mg total) by mouth daily.  Dispense: 90 capsule; Refill: 1 - CBC w/Diff/Platelet - Comprehensive Metabolic Panel (CMET) - Lipid Profile - HgB A1c - TSH  4. Cancer of lower lobe of right lung ([place]) Most recent CT scan shows a new hilar lymphadenopathy. No other changes noted in the lungs, stable emphysema and radiation changes. She will f/u with Dr. Rogue Bussing on 05/14/18 to discuss next steps.  - CBC w/Diff/Platelet - Comprehensive Metabolic Panel (CMET) - Lipid Profile - HgB A1c - TSH  5. COPD, moderate ([facility]) Stable Emphysema. Reports breathing has improved drastically since quitting smoking 4  months ago.  - CBC w/Diff/Platelet - Comprehensive Metabolic Panel (CMET) - Lipid Profile - HgB A1c - TSH  6. Barrett's esophagus without dysplasia See above medical treatment plan for #3.  - CBC w/Diff/Platelet - Comprehensive Metabolic Panel (CMET) - Lipid Profile - HgB A1c - TSH  7. Compulsive tobacco user syndrome Quit smoking 4 months ago.  - CBC w/Diff/Platelet - Comprehensive Metabolic Panel (CMET) - Lipid Profile - HgB A1c - TSH  8. Avitaminosis D H/O this and post menopausal with chronic PPI use. Will check labs as below and  f/u pending results. - CBC w/Diff/Platelet - Comprehensive Metabolic Panel (CMET) - Lipid Profile - HgB A1c - TSH  9. Recurrent major depressive disorder, in partial remission (HCC) Worsening since quitting smoking. Continue lexapro 20mg . Add wellbutrin 150mg  XR. Buspar 30mg  BID also to be continued. - buPROPion (WELLBUTRIN XL) 150 MG 24 hr tablet; Take 1 tablet (150 mg total) by mouth daily.  Dispense: 90 tablet; Refill: 1 - CBC w/Diff/Platelet - Comprehensive Metabolic Panel (CMET) - Lipid Profile - HgB A1c - TSH  10. Postmenopausal estrogen deficiency Due for BMD. Pt is postmenopausal and h/o lung cancer. Osteoporosis screen.  - DG Bone Density; Future - CBC w/Diff/Platelet - Comprehensive Metabolic Panel (CMET) - Lipid Profile - HgB A1c - TSH  11. Age-related osteoporosis without current pathological fracture See above medical treatment plan. - DG Bone Density; Future - CBC w/Diff/Platelet - Comprehensive Metabolic Panel (CMET) - Lipid Profile - HgB A1c - TSH  12. Yeast infection Stable. Diagnosis pulled for medication refill. Continue current medical treatment plan. - clotrimazole-betamethasone (LOTRISONE) cream; 1 (one) Cream Cream Apply twice daily to affected area.  Not for Internal use.  Dispense: 45 g; Refill: 1  13. Abdominal aortic aneurysm (AAA) 3.0 cm to 5.0 cm in diameter in female [facility]) Followed by Dr. Delana Meyer. Due for repeat imaging in March 2020.   ------------------------------------------------------------------------------------------------------------    Mar Daring, PA-C  [facility]

## 2018-05-02 ENCOUNTER — Encounter: Payer: Self-pay | Admitting: Physician Assistant

## 2018-05-02 DIAGNOSIS — R7309 Other abnormal glucose: Secondary | ICD-10-CM | POA: Insufficient documentation

## 2018-05-02 LAB — CBC WITH DIFFERENTIAL/PLATELET
BASOS: 1 %
Basophils Absolute: 0.1 10*3/uL (ref 0.0–0.2)
EOS (ABSOLUTE): 0.3 10*3/uL (ref 0.0–0.4)
Eos: 4 %
Hematocrit: 39.8 % (ref 34.0–46.6)
Hemoglobin: 13.6 g/dL (ref 11.1–15.9)
IMMATURE GRANS (ABS): 0.1 10*3/uL (ref 0.0–0.1)
Immature Granulocytes: 1 %
LYMPHS: 17 %
Lymphocytes Absolute: 1.2 10*3/uL (ref 0.7–3.1)
MCH: 31.1 pg (ref 26.6–33.0)
MCHC: 34.2 g/dL (ref 31.5–35.7)
MCV: 91 fL (ref 79–97)
Monocytes Absolute: 0.7 10*3/uL (ref 0.1–0.9)
Monocytes: 10 %
Neutrophils Absolute: 4.8 10*3/uL (ref 1.4–7.0)
Neutrophils: 67 %
Platelets: 290 10*3/uL (ref 150–450)
RBC: 4.37 x10E6/uL (ref 3.77–5.28)
RDW: 13.8 % (ref 12.3–15.4)
WBC: 7.1 10*3/uL (ref 3.4–10.8)

## 2018-05-02 LAB — COMPREHENSIVE METABOLIC PANEL
ALT: 14 IU/L (ref 0–32)
AST: 18 IU/L (ref 0–40)
Albumin/Globulin Ratio: 1.7 (ref 1.2–2.2)
Albumin: 4.2 g/dL (ref 3.5–4.8)
Alkaline Phosphatase: 73 IU/L (ref 39–117)
BUN/Creatinine Ratio: 17 (ref 12–28)
BUN: 15 mg/dL (ref 8–27)
Bilirubin Total: 0.3 mg/dL (ref 0.0–1.2)
CO2: 25 mmol/L (ref 20–29)
Calcium: 9.6 mg/dL (ref 8.7–10.3)
Chloride: 102 mmol/L (ref 96–106)
Creatinine, Ser: 0.89 mg/dL (ref 0.57–1.00)
GFR calc non Af Amer: 65 mL/min/{1.73_m2} (ref >59)
GFR, EST AFRICAN AMERICAN: 75 mL/min/{1.73_m2} (ref >59)
Globulin, Total: 2.5 g/dL (ref 1.5–4.5)
Glucose: 89 mg/dL (ref 65–99)
Potassium: 4.9 mmol/L (ref 3.5–5.2)
Sodium: 140 mmol/L (ref 134–144)
TOTAL PROTEIN: 6.7 g/dL (ref 6.0–8.5)

## 2018-05-02 LAB — LIPID PANEL
Chol/HDL Ratio: 3.7 ratio (ref 0.0–4.4)
Cholesterol, Total: 220 mg/dL — ABNORMAL HIGH (ref 100–199)
HDL: 59 mg/dL (ref >39)
LDL Calculated: 138 mg/dL — ABNORMAL HIGH (ref 0–99)
Triglycerides: 116 mg/dL (ref 0–149)
VLDL Cholesterol Cal: 23 mg/dL (ref 5–40)

## 2018-05-02 LAB — TSH: TSH: 2.79 u[IU]/mL (ref 0.450–4.500)

## 2018-05-02 LAB — HEMOGLOBIN A1C
Est. average glucose Bld gHb Est-mCnc: 120 mg/dL
Hgb A1c MFr Bld: 5.8 % — ABNORMAL HIGH (ref 4.8–5.6)

## 2018-05-04 ENCOUNTER — Telehealth: Payer: Self-pay

## 2018-05-04 NOTE — Telephone Encounter (Signed)
-----   Message from Mar Daring, [place] sent at 05/02/2018 11:56 AM EST ----- Blood count is normal. Kidney and liver function are normal. Cholesterol up slightly when compared to last year, which is most likely associated with weight gain as well. Work on healthy dieting with limiting processed foods, saturated fats, red meats, carbohydrates and sugars. A1c also borderline high at 5.8. Thyroid normal.

## 2018-05-04 NOTE — Telephone Encounter (Signed)
LVMTRC 

## 2018-05-04 NOTE — Telephone Encounter (Signed)
Viewed by [name] on 05/02/2018 12:40 PM

## 2018-05-06 DIAGNOSIS — I714 Abdominal aortic aneurysm, without rupture, unspecified: Secondary | ICD-10-CM

## 2018-05-06 DIAGNOSIS — Z9981 Dependence on supplemental oxygen: Secondary | ICD-10-CM

## 2018-05-06 HISTORY — DX: Dependence on supplemental oxygen: Z99.81

## 2018-05-06 HISTORY — DX: Abdominal aortic aneurysm, without rupture, unspecified: I71.40

## 2018-05-06 HISTORY — DX: Abdominal aortic aneurysm, without rupture: I71.4

## 2018-05-11 ENCOUNTER — Encounter: Payer: Self-pay | Admitting: *Deleted

## 2018-05-11 ENCOUNTER — Ambulatory Visit: Payer: Medicare Other

## 2018-05-12 ENCOUNTER — Encounter
Admission: RE | Disposition: A | Discharge status: Home / Self Care | Payer: Self-pay | Source: Ambulatory Visit | Attending: Gastroenterology

## 2018-05-12 ENCOUNTER — Ambulatory Visit
Admission: RE | Admit: 2018-05-12 | Discharge: 2018-05-12 | Disposition: A | Discharge status: Home / Self Care | Payer: Medicare Other | Source: Ambulatory Visit | Attending: Gastroenterology | Admitting: Gastroenterology

## 2018-05-12 ENCOUNTER — Ambulatory Visit: Payer: Medicare Other | Admitting: Anesthesiology

## 2018-05-12 ENCOUNTER — Telehealth: Payer: Self-pay

## 2018-05-12 DIAGNOSIS — F419 Anxiety disorder, unspecified: Secondary | ICD-10-CM | POA: Insufficient documentation

## 2018-05-12 DIAGNOSIS — E7800 Pure hypercholesterolemia, unspecified: Secondary | ICD-10-CM | POA: Diagnosis not present

## 2018-05-12 DIAGNOSIS — Z800 Family history of malignant neoplasm of digestive organs: Secondary | ICD-10-CM | POA: Insufficient documentation

## 2018-05-12 DIAGNOSIS — Z87891 Personal history of nicotine dependence: Secondary | ICD-10-CM | POA: Diagnosis not present

## 2018-05-12 DIAGNOSIS — K644 Residual hemorrhoidal skin tags: Secondary | ICD-10-CM | POA: Insufficient documentation

## 2018-05-12 DIAGNOSIS — M8580 Other specified disorders of bone density and structure, unspecified site: Secondary | ICD-10-CM | POA: Diagnosis not present

## 2018-05-12 DIAGNOSIS — K2270 Barrett's esophagus without dysplasia: Secondary | ICD-10-CM | POA: Insufficient documentation

## 2018-05-12 DIAGNOSIS — K449 Diaphragmatic hernia without obstruction or gangrene: Secondary | ICD-10-CM | POA: Diagnosis not present

## 2018-05-12 DIAGNOSIS — K210 Gastro-esophageal reflux disease with esophagitis: Secondary | ICD-10-CM | POA: Insufficient documentation

## 2018-05-12 DIAGNOSIS — K635 Polyp of colon: Secondary | ICD-10-CM | POA: Diagnosis not present

## 2018-05-12 DIAGNOSIS — K2950 Unspecified chronic gastritis without bleeding: Secondary | ICD-10-CM | POA: Diagnosis not present

## 2018-05-12 DIAGNOSIS — J449 Chronic obstructive pulmonary disease, unspecified: Secondary | ICD-10-CM | POA: Insufficient documentation

## 2018-05-12 DIAGNOSIS — Z7982 Long term (current) use of aspirin: Secondary | ICD-10-CM | POA: Diagnosis not present

## 2018-05-12 DIAGNOSIS — K5730 Diverticulosis of large intestine without perforation or abscess without bleeding: Secondary | ICD-10-CM | POA: Insufficient documentation

## 2018-05-12 DIAGNOSIS — F329 Major depressive disorder, single episode, unspecified: Secondary | ICD-10-CM | POA: Insufficient documentation

## 2018-05-12 DIAGNOSIS — K621 Rectal polyp: Secondary | ICD-10-CM | POA: Insufficient documentation

## 2018-05-12 DIAGNOSIS — G4730 Sleep apnea, unspecified: Secondary | ICD-10-CM | POA: Insufficient documentation

## 2018-05-12 DIAGNOSIS — Z79899 Other long term (current) drug therapy: Secondary | ICD-10-CM | POA: Insufficient documentation

## 2018-05-12 DIAGNOSIS — K640 First degree hemorrhoids: Secondary | ICD-10-CM | POA: Diagnosis not present

## 2018-05-12 DIAGNOSIS — Z8711 Personal history of peptic ulcer disease: Secondary | ICD-10-CM | POA: Diagnosis not present

## 2018-05-12 HISTORY — PX: COLONOSCOPY WITH PROPOFOL: SHX5780

## 2018-05-12 HISTORY — DX: Pure hypercholesterolemia, unspecified: E78.00

## 2018-05-12 HISTORY — PX: ESOPHAGOGASTRODUODENOSCOPY (EGD) WITH PROPOFOL: SHX5813

## 2018-05-12 SURGERY — COLONOSCOPY WITH PROPOFOL
Anesthesia: General

## 2018-05-12 MED ORDER — PROPOFOL 10 MG/ML IV BOLUS
INTRAVENOUS | Status: DC | PRN
  Administered 2018-05-12: 50 mg via INTRAVENOUS
  Administered 2018-05-12: 30 mg via INTRAVENOUS
  Administered 2018-05-12: 50 mg via INTRAVENOUS
  Administered 2018-05-12: 20 mg via INTRAVENOUS
  Administered 2018-05-12: 30 mg via INTRAVENOUS
  Administered 2018-05-12: 50 mg via INTRAVENOUS
  Administered 2018-05-12: 30 mg via INTRAVENOUS
  Administered 2018-05-12: 50 mg via INTRAVENOUS
  Administered 2018-05-12: 20 mg via INTRAVENOUS
  Administered 2018-05-12: 30 mg via INTRAVENOUS
  Administered 2018-05-12 (×3): 50 mg via INTRAVENOUS

## 2018-05-12 MED ORDER — SODIUM CHLORIDE 0.9 % IV SOLN: INTRAVENOUS | Status: DC

## 2018-05-12 MED ORDER — PROPOFOL 500 MG/50ML IV EMUL
INTRAVENOUS | Status: AC
  Filled 2018-05-12: qty 50

## 2018-05-12 MED ORDER — SODIUM CHLORIDE 0.9 % IV SOLN
INTRAVENOUS | Status: DC
  Administered 2018-05-12: 1000 mL via INTRAVENOUS
  Administered 2018-05-12: 09:00:00 via INTRAVENOUS

## 2018-05-12 MED ORDER — PROPOFOL 500 MG/50ML IV EMUL
INTRAVENOUS | Status: DC | PRN
  Administered 2018-05-12: 80 ug/kg/min via INTRAVENOUS

## 2018-05-12 NOTE — Op Note (Signed)
[facility] Gastroenterology Patient Name: Cathy Watts Procedure Date: 05/12/2018 8:24 AM MRN: [id number] Account #: [id number] Date of Birth: [birth year] Admit Type: Outpatient Age: 73 Room: [facility] ENDO ROOM 3 Gender: Female Note Status: Finalized Procedure:            Colonoscopy Indications:          Family history of colon cancer in a first-degree                        relative Providers:            Lollie Sails, MD Referring MD:         Mar Daring (Referring MD) Medicines:            Monitored Anesthesia Care Complications:        No immediate complications. Procedure:            Pre-Anesthesia Assessment:                       - ASA Grade Assessment: III - A patient with severe                        systemic disease.                       After obtaining informed consent, the colonoscope was                        passed under direct vision. Throughout the procedure,                        the patient's blood pressure, pulse, and oxygen                        saturations were monitored continuously. The                        Colonoscope was introduced through the anus and                        advanced to the the cecum, identified by appendiceal                        orifice and ileocecal valve. The colonoscopy was                        performed with moderate difficulty due to poor bowel                        prep and significant looping. Successful completion of                        the procedure was aided by changing the patient to a                        supine position, changing the patient to a prone                        position and using manual pressure. The patient  tolerated the procedure well. The quality of the bowel                        preparation was fair. Findings:      Many small and large-mouthed diverticula were found in the sigmoid       colon, descending colon, transverse colon and  ascending colon.      A 1 mm polyp was found in the descending colon. The polyp was sessile.       The polyp was removed with a cold biopsy forceps. Resection and       retrieval were complete.      Three sessile polyps were found in the descending colon. The polyps were       1 to 3 mm in size. These polyps were removed with a cold biopsy forceps.       Resection and retrieval were complete.      A 2 mm polyp was found in the rectum. The polyp was sessile. The polyp       was removed with a cold biopsy forceps. Resection and retrieval were       complete.      Non-bleeding internal hemorrhoids were found during retroflexion. The       hemorrhoids were small and Grade I (internal hemorrhoids that do not       prolapse).      No additional abnormalities were found on retroflexion.      Skin tags were found on perianal exam.      The digital rectal exam was normal otherwise.      A single small localized angioectasia without bleeding, deep to the       mucosal lining, was found in the cecum. Impression:           - Preparation of the colon was fair.                       - Diverticulosis in the sigmoid colon, in the                        descending colon, in the transverse colon and in the                        ascending colon.                       - One 1 mm polyp in the descending colon, removed with                        a cold biopsy forceps. Resected and retrieved.                       - Three 1 to 3 mm polyps in the descending colon,                        removed with a cold biopsy forceps. Resected and                        retrieved.                       - One 2 mm polyp in the rectum, removed with a cold  biopsy forceps. Resected and retrieved.                       - Non-bleeding internal hemorrhoids.                       - Perianal skin tags found on perianal exam.                       - A single non-bleeding colonic  angioectasia. Recommendation:       - Discharge patient to home.                       - Await pathology results.                       - Telephone GI clinic for pathology results in 1 week. Procedure Code(s):    --- Professional ---                       [phone], Colonoscopy, flexible; with biopsy, single or                        multiple Diagnosis Code(s):    --- Professional ---                       K64.0, First degree hemorrhoids                       D12.4, Benign neoplasm of descending colon                       K62.1, Rectal polyp                       K55.20, Angiodysplasia of colon without hemorrhage                       K64.4, Residual hemorrhoidal skin tags                       Z80.0, Family history of malignant neoplasm of                        digestive organs                       K57.30, Diverticulosis of large intestine without                        perforation or abscess without bleeding CPT copyright 2018 American [employer] Association. All rights reserved. The codes documented in this report are preliminary and upon coder review may  be revised to meet current compliance requirements. Lollie Sails, MD 05/12/2018 9:46:55 AM This report has been signed electronically. Number of Addenda: 0 Note Initiated On: 05/12/2018 8:24 AM Scope Withdrawal Time: 0 hours 12 minutes 40 seconds  Total Procedure Duration: 0 hours 33 minutes 29 seconds       [facility]

## 2018-05-12 NOTE — Telephone Encounter (Signed)
LMTCB to schedule AWV. -MM

## 2018-05-12 NOTE — Telephone Encounter (Signed)
patient states that she already had AWV and addressed screening with Tawanna Sat when she was seen in office. KW

## 2018-05-12 NOTE — Anesthesia Preprocedure Evaluation (Addendum)
Anesthesia Evaluation  Patient identified by MRN, date of birth, ID band Patient awake    Reviewed: Allergy & Precautions, H&P , NPO status , Patient's Chart, lab work & pertinent test results  Airway Mallampati: III       Dental  (+) Teeth Intact   Pulmonary asthma , sleep apnea , COPD, Recent URI  (3 weeks ago), Resolved, former smoker,           Cardiovascular negative cardio ROS       Neuro/Psych PSYCHIATRIC DISORDERS Anxiety Depression negative neurological ROS     GI/Hepatic Neg liver ROS, GERD  ,  Endo/Other  negative endocrine ROS  Renal/GU negative Renal ROS  negative genitourinary   Musculoskeletal   Abdominal   Peds  Hematology negative hematology ROS (+)   Anesthesia Other Findings Past Medical History: No date: Anxiety No date: Asthma No date: Barrett's esophagus No date: COPD (chronic obstructive pulmonary disease) (HCC) No date: Depression No date: GERD (gastroesophageal reflux disease) No date: History of peptic ulcer disease No date: Hypercholesterolemia No date: Low oxygen saturation     Comment:  2l hs No date: Osteopenia No date: Panic disorder No date: Personal history of radiation therapy 01/16/2015: Personal history of tobacco use, presenting hazards to  health No date: Sleep apnea No date: SOB (shortness of breath) No date: Squamous cell carcinoma of right lung [facility])  Past Surgical History: 1990: CERVICAL CONE BIOPSY 1980's: CHOLECYSTECTOMY 2014: colonoscopy 07/21/2012: COLONOSCOPY 2009: ELBOW FRACTURE SURGERY; Right 02/07/2015: ENDOBRONCHIAL ULTRASOUND; N/A     Comment:  Procedure: ENDOBRONCHIAL ULTRASOUND;  Surgeon: Flora Lipps, MD;  Location: [facility] ORS;  Service: Cardiopulmonary;              Laterality: N/A; 02/26/2016: ESOPHAGOGASTRODUODENOSCOPY (EGD) WITH PROPOFOL; N/A     Comment:  Procedure: ESOPHAGOGASTRODUODENOSCOPY (EGD) WITH               PROPOFOL;   Surgeon: Lollie Sails, MD;  Location:               [facility] ENDOSCOPY;  Service: Endoscopy;  Laterality: N/A;                COPD, Sleep Apnea No date: FRACTURE SURGERY 1979: TONSILLECTOMY 1970's: TUBAL LIGATION No date: UPPER GI ENDOSCOPY  BMI    Body Mass Index:  31.14 kg/m      Reproductive/Obstetrics negative OB ROS                            Anesthesia Physical Anesthesia Plan  ASA: III  Anesthesia Plan: General   Post-op Pain Management:    Induction:   PONV Risk Score and Plan: Propofol infusion and TIVA  Airway Management Planned: Natural Airway and Nasal Cannula  Additional Equipment:   Intra-op Plan:   Post-operative Plan:   Informed Consent: I have reviewed the patients History and Physical, chart, labs and discussed the procedure including the risks, benefits and alternatives for the proposed anesthesia with the patient or authorized representative who has indicated his/her understanding and acceptance.   Dental Advisory Given  Plan Discussed with: Anesthesiologist, CRNA and Surgeon  Anesthesia Plan Comments:         Anesthesia Quick Evaluation

## 2018-05-12 NOTE — Anesthesia Post-op Follow-up Note (Signed)
Anesthesia QCDR form completed.        

## 2018-05-12 NOTE — Telephone Encounter (Signed)
Noted, thank you.  -MM 

## 2018-05-12 NOTE — H&P (Signed)
Outpatient short stay form Pre-procedure 05/12/2018 8:34 AM Lollie Sails MD  Primary Physician: Dr. Fenton Malling  Reason for visit: EGD and colonoscopy  History of present illness: Patient is a 73 year old female presenting today for an EGD and colonoscopy in regards her family history of colon cancer primary relative, mother, and history of Barrett's esophagus without dysplasia.  She is recently had some increase of reflux symptoms that has been responsive to increasing PPI dose.  She takes omeprazole 40 mg once a day.  We discussed timing of the PPI.  She tolerated her prep well.  She takes no aspirin or blood thinning agent with the exception of an 81 mg aspirin that has been held.    Current Facility-Administered Medications:  .  0.9 %  sodium chloride infusion, , Intravenous, Continuous, Lollie Sails, MD, Last Rate: 20 mL/hr at 05/12/18 0829 .  0.9 %  sodium chloride infusion, , Intravenous, Continuous, Lollie Sails, MD  Medications Prior to Admission  Medication Sig Dispense Refill Last Dose  . albuterol (PROAIR HFA) 108 (90 Base) MCG/ACT inhaler Inhale 2 puffs into the lungs every 4 (four) hours as needed for wheezing or shortness of breath. 1 each 2 Past Week at Unknown time  . ALPRAZolam (XANAX) 0.5 MG tablet Take 0.5 mg by mouth at bedtime as needed for anxiety.   05/12/2018 at 0400  . aspirin EC 81 MG tablet Take 81 mg by mouth daily.   Past Month at Unknown time  . buPROPion (WELLBUTRIN XL) 150 MG 24 hr tablet Take 1 tablet (150 mg total) by mouth daily. 90 tablet 1 05/12/2018 at 0400  . busPIRone (BUSPAR) 30 MG tablet TAKE 1 TABLET BY MOUTH TWICE DAILY 60 tablet 11 05/12/2018 at 0400  . CALCIUM CARBONATE-VIT D-MIN PO Take 1 tablet by mouth 2 (two) times daily.    Past Week at Unknown time  . citalopram (CELEXA) 40 MG tablet Take 40 mg by mouth daily.   05/11/2018 at Unknown time  . clotrimazole-betamethasone (LOTRISONE) cream 1 (one) Cream Cream Apply twice daily  to affected area.  Not for Internal use. 45 g 1 05/11/2018 at Unknown time  . escitalopram (LEXAPRO) 20 MG tablet TAKE 1 TABLET BY MOUTH ONCE DAILY 90 tablet 1 05/12/2018 at 0400  . fluticasone furoate-vilanterol (BREO ELLIPTA) 100-25 MCG/INH AEPB Inhale 1 puff into the lungs daily.   05/11/2018 at Unknown time  . Fluticasone-Salmeterol (ADVAIR) 250-50 MCG/DOSE AEPB Inhale 1 puff into the lungs 2 (two) times daily.   05/11/2018 at Unknown time  . Fluticasone-Umeclidin-Vilant (TRELEGY ELLIPTA) 100-62.5-25 MCG/INH AEPB Inhale 1 puff into the lungs daily. 180 each 3 05/12/2018 at 0400  . Magnesium 500 MG CAPS Take 1 capsule by mouth at bedtime.   Past Week at Unknown time  . Omega-3 Fatty Acids (FISH OIL) 600 MG CAPS Take by mouth 2 (two) times daily.   Past Week at Unknown time  . omeprazole (PRILOSEC) 40 MG capsule Take 1 capsule (40 mg total) by mouth daily. 90 capsule 1 05/12/2018 at 0400  . polyethylene glycol powder (GLYCOLAX/MIRALAX) powder TAKE 17 GRAMS OF POWDER MIXED IN 8 OUNCES OF WATER BY MOUTH ONCE A DAY. 255 g 3 05/11/2018 at Unknown time  . pravastatin (PRAVACHOL) 20 MG tablet TAKE 1 TABLET BY MOUTH AT BEDTIME 90 tablet 1 05/11/2018 at Unknown time  . Respiratory Therapy Supplies (FLUTTER) DEVI 1 each by Does not apply route QID. 1 each 0 05/11/2018 at Unknown time  . rOPINIRole (REQUIP)  3 MG tablet TAKE 1 TABLET BY MOUTH AT BEDTIME 90 tablet 1 05/11/2018 at Unknown time  . simvastatin (ZOCOR) 20 MG tablet Take 20 mg by mouth daily.   05/11/2018 at Unknown time  . tiotropium (SPIRIVA) 18 MCG inhalation capsule Place 18 mcg into inhaler and inhale daily.   05/11/2018 at Unknown time  . umeclidinium bromide (INCRUSE ELLIPTA) 62.5 MCG/INH AEPB Inhale 1 puff into the lungs daily.   05/11/2018 at Unknown time     Allergies  Allergen Reactions  . Amoxicillin Hives and Itching     Past Medical History:  Diagnosis Date  . Anxiety   . Asthma   . Barrett's esophagus   . COPD (chronic obstructive pulmonary  disease) ([place])   . Depression   . GERD (gastroesophageal reflux disease)   . History of peptic ulcer disease   . Hypercholesterolemia   . Low oxygen saturation    2l hs  . Osteopenia   . Panic disorder   . Personal history of radiation therapy   . Personal history of tobacco use, presenting hazards to health 01/16/2015  . Sleep apnea   . SOB (shortness of breath)   . Squamous cell carcinoma of right lung (HCC)     Review of systems:      Physical Exam    Heart and lungs: Regular rate and rhythm without rub or gallop, lungs have coarse rhonchi in the upper fields bilaterally.  Lower fields are more much clear    HEENT: Normocephalic atraumatic eyes are anicteric    Other:    Pertinant exam for procedure: Soft nontender nondistended bowel sounds positive normoactive.    Planned proceedures: EGD, colonoscopy and indicated procedures. I have discussed the risks benefits and complications of procedures to include not limited to bleeding, infection, perforation and the risk of sedation and the patient wishes to proceed.    Lollie Sails, MD Gastroenterology 05/12/2018  8:34 AM

## 2018-05-12 NOTE — Transfer of Care (Signed)
Immediate Anesthesia Transfer of Care Note  Patient: Cathy Watts  Procedure(s) Performed: COLONOSCOPY WITH PROPOFOL (N/A ) ESOPHAGOGASTRODUODENOSCOPY (EGD) WITH PROPOFOL (N/A )  Patient Location: PACU  Anesthesia Type:General  Level of Consciousness: awake  Airway & Oxygen Therapy: Patient Spontanous Breathing  Post-op Assessment: Report given to RN  Post vital signs: stable  Last Vitals:  Vitals Value Taken Time  BP 114/67 05/12/2018  9:47 AM  Temp 36.1 C 05/12/2018  9:45 AM  Pulse 87 05/12/2018  9:47 AM  Resp 14 05/12/2018  9:47 AM  SpO2 99 % 05/12/2018  9:47 AM    Last Pain:  Vitals:   05/12/18 0945  TempSrc: Tympanic  PainSc:          Complications: No apparent anesthesia complications

## 2018-05-12 NOTE — Op Note (Signed)
[facility] Gastroenterology Patient Name: Cathy Watts Procedure Date: 05/12/2018 8:25 AM MRN: [id number] Account #: [id number] Date of Birth: 10/12/1945 Admit Type: Outpatient Age: 73 Room: [facility] ENDO ROOM 3 Gender: Female Note Status: Finalized Procedure:            Upper GI endoscopy Indications:          Follow-up of Barrett's esophagus Providers:            Lollie Sails, MD Medicines:            Monitored Anesthesia Care Complications:        No immediate complications. Procedure:            Pre-Anesthesia Assessment:                       - ASA Grade Assessment: III - A patient with severe                        systemic disease.                       After obtaining informed consent, the endoscope was                        passed under direct vision. Throughout the procedure,                        the patient's blood pressure, pulse, and oxygen                        saturations were monitored continuously. The Endoscope                        was introduced through the mouth, and advanced to the                        third part of duodenum. The upper GI endoscopy was                        accomplished without difficulty. The patient tolerated                        the procedure well. Findings:      The Z-line was variable.      There were esophageal mucosal changes secondary to established       short-segment Barrett's disease present in the lower third of the       esophagus. The maximum longitudinal extent of these mucosal changes was       6 cm in length. Mucosa was biopsied with a cold forceps for histology in       a targeted fashion. One specimen bottle was sent to pathology.      Linear/segmental moderate inflammation characterized by congestion       (edema) and erythema was found in the gastric antrum. Biopsies were       taken with a cold forceps for histology.      Patchy minimal inflammation characterized by congestion (edema)  and       erythema was found in the gastric body. Biopsies were taken with a cold       forceps for histology.      A small hiatal hernia  was found. The Z-line was a variable distance from       incisors; the hiatal hernia was sliding.      The exam of the stomach was otherwise normal.      The in the duodenum was normal. Impression:           - Z-line variable.                       - Esophageal mucosal changes secondary to established                        short-segment Barrett's disease. Biopsied.                       - Gastritis. Biopsied.                       - Gastritis. Biopsied.                       - Small hiatal hernia.                       - Normal. Recommendation:       - Continue present medications.                       - Await pathology results.                       - Telephone GI clinic for pathology results in 1 week. Procedure Code(s):    --- Professional ---                       [phone], Esophagogastroduodenoscopy, flexible, transoral;                        with biopsy, single or multiple Diagnosis Code(s):    --- Professional ---                       K22.8, Other specified diseases of esophagus                       K22.70, Barrett's esophagus without dysplasia                       K29.70, Gastritis, unspecified, without bleeding                       K44.9, Diaphragmatic hernia without obstruction or                        gangrene CPT copyright 2018 American Medical Association. All rights reserved. The codes documented in this report are preliminary and upon coder review may  be revised to meet current compliance requirements. Lollie Sails, MD 05/12/2018 9:04:27 AM This report has been signed electronically. Number of Addenda: 0 Note Initiated On: 05/12/2018 8:25 AM      [facility]

## 2018-05-13 ENCOUNTER — Encounter: Payer: Self-pay | Admitting: Gastroenterology

## 2018-05-13 NOTE — Anesthesia Postprocedure Evaluation (Signed)
Anesthesia Post Note  Patient: Cathy Watts  Procedure(s) Performed: COLONOSCOPY WITH PROPOFOL (N/A ) ESOPHAGOGASTRODUODENOSCOPY (EGD) WITH PROPOFOL (N/A )  Patient location during evaluation: PACU Anesthesia Type: General Level of consciousness: awake and alert Pain management: pain level controlled Vital Signs Assessment: post-procedure vital signs reviewed and stable Respiratory status: spontaneous breathing, nonlabored ventilation and respiratory function stable Cardiovascular status: blood pressure returned to baseline and stable Postop Assessment: no apparent nausea or vomiting Anesthetic complications: no     Last Vitals:  Vitals:   05/12/18 0947 05/12/18 0950  BP: 114/67 136/87  Pulse: 87 86  Resp: 14 16  Temp:    SpO2: 99% 98%    Last Pain:  Vitals:   05/12/18 0945  TempSrc: Tympanic  PainSc:                  Durenda Hurt

## 2018-05-14 ENCOUNTER — Encounter: Payer: Self-pay | Admitting: Internal Medicine

## 2018-05-14 ENCOUNTER — Other Ambulatory Visit: Payer: Medicare Other

## 2018-05-14 ENCOUNTER — Ambulatory Visit
Admission: RE | Admit: 2018-05-14 | Discharge: 2018-05-14 | Disposition: A | Discharge status: Home / Self Care | Payer: Medicare Other | Source: Ambulatory Visit | Attending: Internal Medicine | Admitting: Internal Medicine

## 2018-05-14 ENCOUNTER — Inpatient Hospital Stay: Payer: Medicare Other | Attending: Internal Medicine | Admitting: Internal Medicine

## 2018-05-14 VITALS — BP 134/84 | HR 86 | Temp 97.9°F | Resp 16 | Wt 216.0 lb

## 2018-05-14 DIAGNOSIS — R0602 Shortness of breath: Secondary | ICD-10-CM | POA: Insufficient documentation

## 2018-05-14 DIAGNOSIS — C3431 Malignant neoplasm of lower lobe, right bronchus or lung: Secondary | ICD-10-CM | POA: Insufficient documentation

## 2018-05-14 DIAGNOSIS — J449 Chronic obstructive pulmonary disease, unspecified: Secondary | ICD-10-CM | POA: Insufficient documentation

## 2018-05-14 DIAGNOSIS — Z85118 Personal history of other malignant neoplasm of bronchus and lung: Secondary | ICD-10-CM

## 2018-05-14 DIAGNOSIS — Z87891 Personal history of nicotine dependence: Secondary | ICD-10-CM | POA: Insufficient documentation

## 2018-05-14 DIAGNOSIS — R05 Cough: Secondary | ICD-10-CM | POA: Diagnosis not present

## 2018-05-14 DIAGNOSIS — R590 Localized enlarged lymph nodes: Secondary | ICD-10-CM | POA: Insufficient documentation

## 2018-05-14 DIAGNOSIS — M7989 Other specified soft tissue disorders: Secondary | ICD-10-CM | POA: Insufficient documentation

## 2018-05-14 DIAGNOSIS — Z923 Personal history of irradiation: Secondary | ICD-10-CM | POA: Insufficient documentation

## 2018-05-14 LAB — SURGICAL PATHOLOGY

## 2018-05-14 NOTE — Assessment & Plan Note (Addendum)
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  #December 2019 left hilar approximately 2 cm lymph node noted-new compared to June 2019 scan-concerning for recurrence versus reactive.  Recommend PET scan ASAP.  #Left leg swelling-rule out DVT; recommend ultrasound Dopplers.   # DISPOSITION: # PET ASAP # [place] Left LE STAT # follow up in 1-2 days post PET-Dr.B  # I reviewed the blood work- with the patient in detail; also reviewed the imaging independently [as summarized above]; and with the patient in detail.  We also reviewed the tumor conference.   Addendum: Ultrasound Dopplers left lower extremity negative for DVT.  Patient informed through radiology.

## 2018-05-14 NOTE — Progress Notes (Signed)
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 NOV 15th CT NED  # COPD on 02 at night; hx of smoking.  --------------------------------------------------    DIAGNOSIS: Lung ca  STAGE:    I     ;GOALS: cure  CURRENT/MOST RECENT THERAPY: surveillaince      Cancer of lower lobe of right lung [facility])       [place] OFFICE PROGRESS NOTE  Patient Care Team: Mar Daring, [name], MD as Consulting Physician (Ophthalmology)  Cancer Staging Cancer of lower lobe of right lung [facility]) Staging form: Lung, AJCC 7th Edition - Clinical: No stage assigned - Unsigned    Oncology History   # SEP 2016-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 NOV 15th CT NED  # COPD on 02 at night; hx of smoking.  --------------------------------------------------    DIAGNOSIS: Lung ca  STAGE:    I     ;GOALS: cure  CURRENT/MOST RECENT THERAPY: surveillaince      Cancer of lower lobe of right lung (Cathy Watts)     INTERVAL HISTORY:  Cathy Watts 73 y.o.  female pleasant patient above history of right lower lobe stage I lung cancer status post SBRT is here today with results of surveillance CT scan.  Patient has chronic shortness of breath chronic cough.  Not any worse.  Patient has been treated with antibiotics for bronchitis last few months.  No hemoptysis.  No headaches.   Review of Systems  Constitutional: Positive for  malaise/fatigue. Negative for chills, diaphoresis, fever and weight loss.  HENT: Negative for nosebleeds and sore throat.   Eyes: Negative for double vision.  Respiratory: Positive for cough and shortness of breath. Negative for hemoptysis, sputum production and wheezing.   Cardiovascular: Negative for chest pain, palpitations, orthopnea and leg swelling.  Gastrointestinal: Negative for abdominal pain, blood in stool, constipation, diarrhea, heartburn, melena, nausea and vomiting.  Genitourinary: Negative for dysuria, frequency and urgency.  Musculoskeletal: Negative for back pain and joint pain.  Skin: Negative.  Negative for itching and rash.  Neurological: Negative for dizziness, tingling, focal weakness, weakness and headaches.  Endo/Heme/Allergies: Does not bruise/bleed easily.  Psychiatric/Behavioral: Negative for depression. The patient is not nervous/anxious and does not have insomnia.       PAST MEDICAL HISTORY :  Past Medical History:  Diagnosis Date  . Anxiety   . Asthma   . Barrett's esophagus   . COPD (chronic obstructive pulmonary disease) ([place])   . Depression   . GERD (gastroesophageal reflux disease)   . History of peptic ulcer disease   . Hypercholesterolemia   . Low oxygen saturation    2l hs  . Osteopenia   . Panic disorder   . Personal history of radiation therapy   . Personal history of tobacco use, presenting hazards to health 01/16/2015  . Sleep apnea   . SOB (shortness of breath)   . Squamous cell carcinoma of right lung ([place])  PAST SURGICAL HISTORY :   Past Surgical History:  Procedure Laterality Date  . CERVICAL CONE BIOPSY  1990  . CHOLECYSTECTOMY  1980's  . colonoscopy  2014  . COLONOSCOPY  07/21/2012  . COLONOSCOPY WITH PROPOFOL N/A 05/12/2018   Procedure: COLONOSCOPY WITH PROPOFOL;  Surgeon: Lollie Sails, MD;  Location: [facility] ENDOSCOPY;  Service: Endoscopy;  Laterality: N/A;  . ELBOW FRACTURE SURGERY Right 2009  . ENDOBRONCHIAL  ULTRASOUND N/A 02/07/2015   Procedure: ENDOBRONCHIAL ULTRASOUND;  Surgeon: Flora Lipps, MD;  Location: [facility] ORS;  Service: Cardiopulmonary;  Laterality: N/A;  . ESOPHAGOGASTRODUODENOSCOPY (EGD) WITH PROPOFOL N/A 02/26/2016   Procedure: ESOPHAGOGASTRODUODENOSCOPY (EGD) WITH PROPOFOL;  Surgeon: Lollie Sails, MD;  Location: [facility] ENDOSCOPY;  Service: Endoscopy;  Laterality: N/A;  COPD, Sleep Apnea  . ESOPHAGOGASTRODUODENOSCOPY (EGD) WITH PROPOFOL N/A 05/12/2018   Procedure: ESOPHAGOGASTRODUODENOSCOPY (EGD) WITH PROPOFOL;  Surgeon: Lollie Sails, MD;  Location: [facility] ENDOSCOPY;  Service: Endoscopy;  Laterality: N/A;  . FRACTURE SURGERY    . TONSILLECTOMY  1979  . TUBAL LIGATION  1970's  . UPPER GI ENDOSCOPY      FAMILY HISTORY :   Family History  Problem Relation Age of Onset  . Colon cancer Mother        colon  . Diabetes Mother   . Arthritis Mother   . Glaucoma Mother   . Stomach cancer Father        stomach  . Throat cancer Brother        throat  . Breast cancer Sister        breast  . Diabetes Sister   . Cirrhosis Sister   . Cancer Paternal Aunt   . Parkinson's disease Sister   . Heart attack Sister   . Ovarian cancer Sister     SOCIAL HISTORY:   Social History   Tobacco Use  . Smoking status: Former Smoker    Packs/day: 1.25    Years: 55.00    Pack years: 68.75    Types: Cigarettes  . Smokeless tobacco: Never Used  . Tobacco comment: used to use E-cig  Substance Use Topics  . Alcohol use: Yes    Alcohol/week: 0.0 standard drinks    Comment: occasionally- wine  . Drug use: No    ALLERGIES:  is allergic to amoxicillin.  MEDICATIONS:  Current Outpatient Medications  Medication Sig Dispense Refill  . albuterol (PROAIR HFA) 108 (90 Base) MCG/ACT inhaler Inhale 2 puffs into the lungs every 4 (four) hours as needed for wheezing or shortness of breath. 1 each 2  . ALPRAZolam (XANAX) 0.5 MG tablet Take 0.5 mg by mouth at bedtime as needed for anxiety.    Marland Kitchen  aspirin EC 81 MG tablet Take 81 mg by mouth daily.    Marland Kitchen buPROPion (WELLBUTRIN XL) 150 MG 24 hr tablet Take 1 tablet (150 mg total) by mouth daily. 90 tablet 1  . busPIRone (BUSPAR) 30 MG tablet TAKE 1 TABLET BY MOUTH TWICE DAILY 60 tablet 11  . CALCIUM CARBONATE-VIT D-MIN PO Take 1 tablet by mouth 2 (two) times daily.     . citalopram (CELEXA) 40 MG tablet Take 40 mg by mouth daily.    . clotrimazole-betamethasone (LOTRISONE) cream 1 (one) Cream Cream Apply twice daily to affected area.  Not for Internal use. 45 g 1  . escitalopram (LEXAPRO) 20 MG tablet TAKE 1 TABLET BY MOUTH ONCE DAILY 90 tablet 1  . fluticasone furoate-vilanterol (BREO ELLIPTA) 100-25 MCG/INH AEPB Inhale 1 puff into the lungs  daily.    . Fluticasone-Salmeterol (ADVAIR) 250-50 MCG/DOSE AEPB Inhale 1 puff into the lungs 2 (two) times daily.    . Fluticasone-Umeclidin-Vilant (TRELEGY ELLIPTA) 100-62.5-25 MCG/INH AEPB Inhale 1 puff into the lungs daily. 180 each 3  . Magnesium 500 MG CAPS Take 1 capsule by mouth at bedtime.    . Omega-3 Fatty Acids (FISH OIL) 600 MG CAPS Take by mouth 2 (two) times daily.    Marland Kitchen omeprazole (PRILOSEC) 40 MG capsule Take 1 capsule (40 mg total) by mouth daily. 90 capsule 1  . polyethylene glycol powder (GLYCOLAX/MIRALAX) powder TAKE 17 GRAMS OF POWDER MIXED IN 8 OUNCES OF WATER BY MOUTH ONCE A DAY. 255 g 3  . pravastatin (PRAVACHOL) 20 MG tablet TAKE 1 TABLET BY MOUTH AT BEDTIME 90 tablet 1  . Respiratory Therapy Supplies (FLUTTER) DEVI 1 each by Does not apply route QID. 1 each 0  . rOPINIRole (REQUIP) 3 MG tablet TAKE 1 TABLET BY MOUTH AT BEDTIME 90 tablet 1  . simvastatin (ZOCOR) 20 MG tablet Take 20 mg by mouth daily.    Marland Kitchen tiotropium (SPIRIVA) 18 MCG inhalation capsule Place 18 mcg into inhaler and inhale daily.    Marland Kitchen umeclidinium bromide (INCRUSE ELLIPTA) 62.5 MCG/INH AEPB Inhale 1 puff into the lungs daily.     No current facility-administered medications for this visit.     PHYSICAL  EXAMINATION: ECOG PERFORMANCE STATUS: 1 - Symptomatic but completely ambulatory  BP 134/84 (BP Location: Left Arm, Patient Position: Sitting, Cuff Size: Normal)   Pulse 86   Temp 97.9 F (36.6 C) (Tympanic)   Resp 16   Wt 216 lb (98 kg)   BMI 30.99 kg/m   Filed Weights   05/14/18 0959  Weight: 216 lb (98 kg)   Physical Exam  Constitutional: She is oriented to person, place, and time and well-developed, well-nourished, and in no distress.  Accompanied by 2 daughters.  Not on oxygen.  HENT:  Head: Normocephalic and atraumatic.  Mouth/Throat: Oropharynx is clear and moist. No oropharyngeal exudate.  Eyes: Pupils are equal, round, and reactive to light.  Neck: Normal range of motion. Neck supple.  Cardiovascular: Normal rate and regular rhythm.  Pulmonary/Chest: No respiratory distress. She has no wheezes.  Decreased air entry bilaterally.  Abdominal: Soft. Bowel sounds are normal. She exhibits no distension and no mass. There is no abdominal tenderness. There is no rebound and no guarding.  Musculoskeletal: Normal range of motion.        General: No tenderness or edema.  Neurological: She is alert and oriented to person, place, and time.  Skin: Skin is warm.  Psychiatric: Affect normal.      LABORATORY DATA:  I have reviewed the data as listed    Component Value Date/Time   NA 140 05/01/2018 1116   K 4.9 05/01/2018 1116   CL 102 05/01/2018 1116   CO2 25 05/01/2018 1116   GLUCOSE 89 05/01/2018 1116   GLUCOSE 122 (H) 10/24/2017 1321   BUN 15 05/01/2018 1116   CREATININE 0.89 05/01/2018 1116   CALCIUM 9.6 05/01/2018 1116   PROT 6.7 05/01/2018 1116   ALBUMIN 4.2 05/01/2018 1116   AST 18 05/01/2018 1116   ALT 14 05/01/2018 1116   ALKPHOS 73 05/01/2018 1116   BILITOT 0.3 05/01/2018 1116   GFRNONAA 65 05/01/2018 1116   GFRAA 75 05/01/2018 1116    No results found for: SPEP, UPEP  Lab Results  Component Value Date   WBC 7.1 05/01/2018  NEUTROABS 4.8 05/01/2018    HGB 13.6 05/01/2018   HCT 39.8 05/01/2018   MCV 91 05/01/2018   PLT 290 05/01/2018      Chemistry      Component Value Date/Time   NA 140 05/01/2018 1116   K 4.9 05/01/2018 1116   CL 102 05/01/2018 1116   CO2 25 05/01/2018 1116   BUN 15 05/01/2018 1116   CREATININE 0.89 05/01/2018 1116      Component Value Date/Time   CALCIUM 9.6 05/01/2018 1116   ALKPHOS 73 05/01/2018 1116   AST 18 05/01/2018 1116   ALT 14 05/01/2018 1116   BILITOT 0.3 05/01/2018 1116       RADIOGRAPHIC STUDIES: I have personally reviewed the radiological images as listed and agreed with the findings in the report. US Venous Img Lower Unilateral Left  Result Date: 05/14/2018 CLINICAL DATA:  Left lower extremity edema for several weeks. History of malignancy. Evaluate for DVT. EXAM: LEFT LOWER EXTREMITY VENOUS DOPPLER ULTRASOUND TECHNIQUE: Gray-scale sonography with graded compression, as well as color Doppler and duplex ultrasound were performed to evaluate the lower extremity deep venous systems from the level of the common femoral vein and including the common femoral, femoral, profunda femoral, popliteal and calf veins including the posterior tibial, peroneal and gastrocnemius veins when visible. The superficial great saphenous vein was also interrogated. Spectral Doppler was utilized to evaluate flow at rest and with distal augmentation maneuvers in the common femoral, femoral and popliteal veins. COMPARISON:  None. FINDINGS: Contralateral Common Femoral Vein: Respiratory phasicity is normal and symmetric with the symptomatic side. No evidence of thrombus. Normal compressibility. Common Femoral Vein: No evidence of thrombus. Normal compressibility, respiratory phasicity and response to augmentation. Saphenofemoral Junction: No evidence of thrombus. Normal compressibility and flow on color Doppler imaging. Profunda Femoral Vein: No evidence of thrombus. Normal compressibility and flow on color Doppler imaging.  Femoral Vein: No evidence of thrombus. Normal compressibility, respiratory phasicity and response to augmentation. Popliteal Vein: No evidence of thrombus. Normal compressibility, respiratory phasicity and response to augmentation. Calf Veins: No evidence of thrombus. Normal compressibility and flow on color Doppler imaging. Superficial Great Saphenous Vein: No evidence of thrombus. Normal compressibility. Venous Reflux:  None. Other Findings:  None. IMPRESSION: No evidence of DVT within the left lower extremity. Electronically Signed   By: Sandi Mariscal M.D.   On: 05/14/2018 12:02     ASSESSMENT & PLAN:  Cancer of lower lobe of right lung (HCC) Squamous cell carcinoma of the right lower lobe lung stage I [ cT1aN0]; S/p definitive RT [Dec 2016]; December 2019 PET scan-stable right perihilar radiation changes; however contralateral hilar adenopathy noted [see discussion].  #December 2019 left hilar approximately 2 cm lymph node noted-new compared to June 2019 scan-concerning for recurrence versus reactive.  Recommend PET scan ASAP.  #Left leg swelling-rule out DVT; recommend ultrasound Dopplers.   # DISPOSITION: # PET ASAP # [place] Left LE STAT # follow up in 1-2 days post PET-Dr.B  Addendum: Ultrasound Dopplers left lower extremity negative for DVT.  Patient informed through radiology.   Orders Placed This Encounter  Procedures  . NM PET Image Restag (PS) Skull Base To Thigh    Standing Status:   Future    Standing Expiration Date:   05/14/2019    Order Specific Question:   ** REASON FOR EXAM (FREE TEXT)    Answer:   lung cancer; new left hilar LN    Order Specific Question:   If indicated for the ordered  procedure, I authorize the administration of a radiopharmaceutical per Radiology protocol    Answer:   Yes    Order Specific Question:   Preferred imaging location?    Answer:   [facility]    Order Specific Question:   Radiology Contrast Protocol - do NOT remove file path    Answer:    [email or web address]  . US Venous Img Lower Unilateral Left    Standing Status:   Future    Number of Occurrences:   1    Standing Expiration Date:   07/14/2019    Order Specific Question:   Reason for Exam (SYMPTOM  OR DIAGNOSIS REQUIRED)    Answer:   leg swelling    Order Specific Question:   Preferred imaging location?    Answer:   [facility]   All questions were answered. The patient knows to call the clinic with any problems, questions or concerns.      Cammie Sickle, MD 05/14/2018 3:05 PM

## 2018-05-20 ENCOUNTER — Ambulatory Visit: Payer: Medicare Other | Admitting: Internal Medicine

## 2018-05-24 ENCOUNTER — Other Ambulatory Visit: Payer: Self-pay | Admitting: Physician Assistant

## 2018-05-24 DIAGNOSIS — F419 Anxiety disorder, unspecified: Secondary | ICD-10-CM

## 2018-05-26 ENCOUNTER — Ambulatory Visit
Admission: RE | Admit: 2018-05-26 | Discharge: 2018-05-26 | Disposition: A | Discharge status: Home / Self Care | Payer: Medicare Other | Source: Ambulatory Visit | Attending: Internal Medicine | Admitting: Internal Medicine

## 2018-05-26 ENCOUNTER — Other Ambulatory Visit: Payer: Self-pay | Admitting: Internal Medicine

## 2018-05-26 DIAGNOSIS — R918 Other nonspecific abnormal finding of lung field: Secondary | ICD-10-CM | POA: Diagnosis not present

## 2018-05-26 DIAGNOSIS — C3431 Malignant neoplasm of lower lobe, right bronchus or lung: Secondary | ICD-10-CM | POA: Diagnosis present

## 2018-05-26 DIAGNOSIS — R9389 Abnormal findings on diagnostic imaging of other specified body structures: Secondary | ICD-10-CM | POA: Insufficient documentation

## 2018-05-26 DIAGNOSIS — Z79899 Other long term (current) drug therapy: Secondary | ICD-10-CM | POA: Diagnosis not present

## 2018-05-26 LAB — GLUCOSE, CAPILLARY: Glucose-Capillary: 94 mg/dL (ref 70–99)

## 2018-05-26 MED ORDER — FLUDEOXYGLUCOSE F - 18 (FDG) INJECTION
12.0100 | Freq: Once | INTRAVENOUS | Status: AC | PRN
  Administered 2018-05-26: 12.01 via INTRAVENOUS

## 2018-05-26 NOTE — Progress Notes (Signed)
mdt on 1/23

## 2018-05-27 ENCOUNTER — Ambulatory Visit
Admission: RE | Admit: 2018-05-27 | Discharge: 2018-05-27 | Disposition: A | Discharge status: Home / Self Care | Payer: Medicare Other | Source: Ambulatory Visit | Attending: Radiation Oncology | Admitting: Radiation Oncology

## 2018-05-27 ENCOUNTER — Inpatient Hospital Stay (HOSPITAL_BASED_OUTPATIENT_CLINIC_OR_DEPARTMENT_OTHER): Payer: Medicare Other | Admitting: Internal Medicine

## 2018-05-27 ENCOUNTER — Encounter: Payer: Self-pay | Admitting: Internal Medicine

## 2018-05-27 VITALS — BP 161/97 | HR 87 | Temp 97.8°F | Resp 16 | Wt 218.0 lb

## 2018-05-27 DIAGNOSIS — M7989 Other specified soft tissue disorders: Secondary | ICD-10-CM

## 2018-05-27 DIAGNOSIS — Z923 Personal history of irradiation: Secondary | ICD-10-CM | POA: Insufficient documentation

## 2018-05-27 DIAGNOSIS — R0602 Shortness of breath: Secondary | ICD-10-CM | POA: Diagnosis not present

## 2018-05-27 DIAGNOSIS — Z85118 Personal history of other malignant neoplasm of bronchus and lung: Secondary | ICD-10-CM

## 2018-05-27 DIAGNOSIS — Z87891 Personal history of nicotine dependence: Secondary | ICD-10-CM

## 2018-05-27 DIAGNOSIS — R05 Cough: Secondary | ICD-10-CM | POA: Diagnosis not present

## 2018-05-27 DIAGNOSIS — R590 Localized enlarged lymph nodes: Secondary | ICD-10-CM | POA: Diagnosis not present

## 2018-05-27 DIAGNOSIS — J449 Chronic obstructive pulmonary disease, unspecified: Secondary | ICD-10-CM

## 2018-05-27 DIAGNOSIS — C3411 Malignant neoplasm of upper lobe, right bronchus or lung: Secondary | ICD-10-CM

## 2018-05-27 DIAGNOSIS — R918 Other nonspecific abnormal finding of lung field: Secondary | ICD-10-CM | POA: Diagnosis not present

## 2018-05-27 DIAGNOSIS — C3431 Malignant neoplasm of lower lobe, right bronchus or lung: Secondary | ICD-10-CM

## 2018-05-27 NOTE — Assessment & Plan Note (Addendum)
Squamous cell carcinoma of the right lower lobe lung stage I [ cT1aN0]; S/p definitive RT [Dec 2016]; CT scan 2019 stable right perihilar radiation changes; however contralateral hilar adenopathy noted [see discussion].  Jan 2020 PET scan-suspicious for left hilar uptake/recurrence.  #Discussed that I would recommend a biopsy to confirm recurrence/histology.  We will discuss the tumor conference.  #Discussed that if recurrence is confirmed-chemoradiation likely option.  #Left leg swelling-recent Dopplers negative for DVT.  # DISPOSITION: # TBD  Cc; [place].   # I reviewed the blood work- with the patient in detail; also reviewed the imaging independently [as summarized above]; and with the patient in detail.  We also reviewed the tumor conference.

## 2018-05-27 NOTE — Progress Notes (Signed)
Radiation Oncology Follow up Note  Name: Cathy Watts   Date:   05/27/2018 MRN:  [id number] DOB: 1945-08-27    This 73 y.o. female presents to the clinic today for 3 year follow-up status post radiation therapy to right hilum for stage I squamous cell carcinoma.  REFERRING PROVIDER: Florian Buff*  HPI: patient is a 73 year old female now 3 years out having completed radiation therapy to her right hilum for stage I squamous cell carcinoma. She is seen today in routine follow-up and is doing fairly well. By mouth intake is good she's having no dysphagia cough hemoptysis or chest tightness..she recently had a PET CT scan showing left hilar and AP window nodal disease suggestive of recurrent or metastatic disease.she is scheduled for biopsy. I my review I'm not convinced there is progressive disease in her mediastinum although do favor CT-guided  COMPLICATIONS OF TREATMENT: none  FOLLOW UP COMPLIANCE: keeps appointments   PHYSICAL EXAM:  There were no vitals taken for this visit. Well-developed well-nourished patient in NAD. HEENT reveals PERLA, EOMI, discs not visualized.  Oral cavity is clear. No oral mucosal lesions are identified. Neck is clear without evidence of cervical or supraclavicular adenopathy. Lungs are clear to A&P. Cardiac examination is essentially unremarkable with regular rate and rhythm without murmur rub or thrill. Abdomen is benign with no organomegaly or masses noted. Motor sensory and DTR levels are equal and symmetric in the upper and lower extremities. Cranial nerves II through XII are grossly intact. Proprioception is intact. No peripheral adenopathy or edema is identified. No motor or sensory levels are noted. Crude visual fields are within normal range.  RADIOLOGY RESULTS: cT scans and PET/CT scans reviewed  PLAN: at the present time clinically she is doing well. I would be interested in seeing biopsy results as I am not back convinced we are dealing  with progressive disease in her mediastinum at this time. I have asked to see her back in 6 months for follow-up. She continues close follow-up care with Dr. Ezzie Dural. Should this be biopsy-proven recurrence or new disease may be a candidate for immunotherapy. Patient and family comprehend my recommendationswell. Follow-up today with Dr. Ezzie Dural and scheduling of possibe bronchitis bronchoscopy with biopsy is in order.  I would like to take this opportunity to thank you for allowing me to participate in the care of your patient.Noreene Filbert, MD

## 2018-05-27 NOTE — Progress Notes (Signed)
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 NOV 15th CT NED  # DEC-JAN 2020-CT/PET ?  Left hilar/mediastinal recurrence  # COPD on 02 at night; hx of smoking.  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 NOV 15th CT NED  # DEC-JAN 2020-CT/PET ?  Left hilar/mediastinal recurrence  # COPD on 02 at night; hx of smoking.  --------------------------------------------------    DIAGNOSIS: Lung ca  STAGE:    I     ;GOALS: cure  CURRENT/MOST RECENT THERAPY: surveillaince      Cancer of lower lobe of right lung (HCC)     INTERVAL HISTORY:  Cathy Watts 73 y.o.  female pleasant patient above history of right lower lobe stage I lung cancer status post SBRT is here today with results PET scan.  Patient continues to have chronic shortness of breath chronic cough.  This is not gotten worse.  Denies any hemoptysis.   No headaches.  Review of Systems  Constitutional: Positive for malaise/fatigue. Negative for chills, diaphoresis, fever and weight loss.  HENT: Negative for nosebleeds and sore throat.   Eyes: Negative for double vision.  Respiratory: Positive for cough and shortness of breath. Negative for hemoptysis, sputum production and wheezing.   Cardiovascular: Negative for chest pain, palpitations, orthopnea and leg swelling.  Gastrointestinal: Negative for abdominal pain, blood in stool, constipation, diarrhea, heartburn, melena, nausea and vomiting.  Genitourinary: Negative for dysuria, frequency and urgency.  Musculoskeletal: Negative for back pain and joint pain.  Skin: Negative.  Negative for itching and rash.  Neurological: Negative for dizziness, tingling, focal weakness, weakness and headaches.  Endo/Heme/Allergies: Does not bruise/bleed easily.  Psychiatric/Behavioral: Negative for depression. The patient is not nervous/anxious and does not have insomnia.       PAST MEDICAL HISTORY :  Past Medical History:  Diagnosis Date  . Anxiety   . Asthma   . Barrett's esophagus   . COPD (chronic obstructive pulmonary disease) ([place])   . Depression   . GERD (gastroesophageal reflux disease)   . History of peptic ulcer disease   . Hypercholesterolemia   . Low oxygen saturation    2l hs  . Osteopenia   . Panic disorder   . Personal history of radiation therapy   . Personal history of tobacco use, presenting hazards to health 01/16/2015  . Sleep apnea   . SOB (shortness of breath)   . Squamous cell carcinoma  of right lung ([place])     PAST SURGICAL HISTORY :   Past Surgical History:  Procedure Laterality Date  . CERVICAL CONE BIOPSY  1990  . CHOLECYSTECTOMY  1980's  . colonoscopy  2014  . COLONOSCOPY  07/21/2012  . COLONOSCOPY WITH PROPOFOL N/A 05/12/2018   Procedure: COLONOSCOPY WITH PROPOFOL;  Surgeon: Lollie Sails, MD;  Location: [facility] ENDOSCOPY;  Service: Endoscopy;  Laterality:  N/A;  . ELBOW FRACTURE SURGERY Right 2009  . ENDOBRONCHIAL ULTRASOUND N/A 02/07/2015   Procedure: ENDOBRONCHIAL ULTRASOUND;  Surgeon: Flora Lipps, MD;  Location: [facility] ORS;  Service: Cardiopulmonary;  Laterality: N/A;  . ESOPHAGOGASTRODUODENOSCOPY (EGD) WITH PROPOFOL N/A 02/26/2016   Procedure: ESOPHAGOGASTRODUODENOSCOPY (EGD) WITH PROPOFOL;  Surgeon: Lollie Sails, MD;  Location: [facility] ENDOSCOPY;  Service: Endoscopy;  Laterality: N/A;  COPD, Sleep Apnea  . ESOPHAGOGASTRODUODENOSCOPY (EGD) WITH PROPOFOL N/A 05/12/2018   Procedure: ESOPHAGOGASTRODUODENOSCOPY (EGD) WITH PROPOFOL;  Surgeon: Lollie Sails, MD;  Location: [facility] ENDOSCOPY;  Service: Endoscopy;  Laterality: N/A;  . FRACTURE SURGERY    . TONSILLECTOMY  1979  . TUBAL LIGATION  1970's  . UPPER GI ENDOSCOPY      FAMILY HISTORY :   Family History  Problem Relation Age of Onset  . Colon cancer Mother        colon  . Diabetes Mother   . Arthritis Mother   . Glaucoma Mother   . Stomach cancer Father        stomach  . Throat cancer Brother        throat  . Breast cancer Sister        breast  . Diabetes Sister   . Cirrhosis Sister   . Cancer Paternal Aunt   . Parkinson's disease Sister   . Heart attack Sister   . Ovarian cancer Sister     SOCIAL HISTORY:   Social History   Tobacco Use  . Smoking status: Former Smoker    Packs/day: 1.25    Years: 55.00    Pack years: 68.75    Types: Cigarettes  . Smokeless tobacco: Never Used  . Tobacco comment: used to use E-cig  Substance Use Topics  . Alcohol use: Yes    Alcohol/week: 0.0 standard drinks    Comment: occasionally- wine  . Drug use: No    ALLERGIES:  is allergic to amoxicillin.  MEDICATIONS:  Current Outpatient Medications  Medication Sig Dispense Refill  . albuterol (PROAIR HFA) 108 (90 Base) MCG/ACT inhaler Inhale 2 puffs into the lungs every 4 (four) hours as needed for wheezing or shortness of breath. 1 each 2  . ALPRAZolam (XANAX) 0.5 MG tablet Take  0.5 mg by mouth at bedtime as needed for anxiety.    Marland Kitchen aspirin EC 81 MG tablet Take 81 mg by mouth daily.    Marland Kitchen buPROPion (WELLBUTRIN XL) 150 MG 24 hr tablet Take 1 tablet (150 mg total) by mouth daily. 90 tablet 1  . busPIRone (BUSPAR) 30 MG tablet TAKE 1 TABLET BY MOUTH TWICE DAILY 60 tablet 11  . CALCIUM CARBONATE-VIT D-MIN PO Take 1 tablet by mouth 2 (two) times daily.     . citalopram (CELEXA) 40 MG tablet Take 40 mg by mouth daily.    . clotrimazole-betamethasone (LOTRISONE) cream 1 (one) Cream Cream Apply twice daily to affected area.  Not for Internal use. 45 g 1  . escitalopram (LEXAPRO) 20 MG tablet TAKE 1 TABLET BY MOUTH ONCE DAILY 90 tablet 1  . fluticasone furoate-vilanterol (BREO ELLIPTA) 100-25  MCG/INH AEPB Inhale 1 puff into the lungs daily.    . Fluticasone-Salmeterol (ADVAIR) 250-50 MCG/DOSE AEPB Inhale 1 puff into the lungs 2 (two) times daily.    . Fluticasone-Umeclidin-Vilant (TRELEGY ELLIPTA) 100-62.5-25 MCG/INH AEPB Inhale 1 puff into the lungs daily. 180 each 3  . Magnesium 500 MG CAPS Take 1 capsule by mouth at bedtime.    . Omega-3 Fatty Acids (FISH OIL) 600 MG CAPS Take by mouth 2 (two) times daily.    Marland Kitchen omeprazole (PRILOSEC) 40 MG capsule Take 1 capsule (40 mg total) by mouth daily. 90 capsule 1  . polyethylene glycol powder (GLYCOLAX/MIRALAX) powder TAKE 17 GRAMS OF POWDER MIXED IN 8 OUNCES OF WATER BY MOUTH ONCE A DAY. 255 g 3  . pravastatin (PRAVACHOL) 20 MG tablet TAKE 1 TABLET BY MOUTH AT BEDTIME 90 tablet 1  . Respiratory Therapy Supplies (FLUTTER) DEVI 1 each by Does not apply route QID. 1 each 0  . rOPINIRole (REQUIP) 3 MG tablet TAKE 1 TABLET BY MOUTH AT BEDTIME 90 tablet 1  . simvastatin (ZOCOR) 20 MG tablet Take 20 mg by mouth daily.    Marland Kitchen tiotropium (SPIRIVA) 18 MCG inhalation capsule Place 18 mcg into inhaler and inhale daily.    Marland Kitchen umeclidinium bromide (INCRUSE ELLIPTA) 62.5 MCG/INH AEPB Inhale 1 puff into the lungs daily.     No current  facility-administered medications for this visit.     PHYSICAL EXAMINATION: ECOG PERFORMANCE STATUS: 1 - Symptomatic but completely ambulatory  BP (!) 161/97 (BP Location: Left Arm, Patient Position: Sitting, Cuff Size: Normal)   Pulse 87   Temp 97.8 F (36.6 C) (Tympanic)   Resp 16   Wt 218 lb (98.9 kg)   BMI 31.28 kg/m   Filed Weights   05/27/18 0907  Weight: 218 lb (98.9 kg)   Physical Exam  Constitutional: She is oriented to person, place, and time and well-developed, well-nourished, and in no distress.  Accompanied by 2 daughters.  Not on oxygen.  HENT:  Head: Normocephalic and atraumatic.  Mouth/Throat: Oropharynx is clear and moist. No oropharyngeal exudate.  Eyes: Pupils are equal, round, and reactive to light.  Neck: Normal range of motion. Neck supple.  Cardiovascular: Normal rate and regular rhythm.  Pulmonary/Chest: No respiratory distress. She has no wheezes.  Decreased air entry bilaterally.  Abdominal: Soft. Bowel sounds are normal. She exhibits no distension and no mass. There is no abdominal tenderness. There is no rebound and no guarding.  Musculoskeletal: Normal range of motion.        General: No tenderness or edema.  Neurological: She is alert and oriented to person, place, and time.  Skin: Skin is warm.  Psychiatric: Affect normal.      LABORATORY DATA:  I have reviewed the data as listed    Component Value Date/Time   NA 140 05/01/2018 1116   K 4.9 05/01/2018 1116   CL 102 05/01/2018 1116   CO2 25 05/01/2018 1116   GLUCOSE 89 05/01/2018 1116   GLUCOSE 122 (H) 10/24/2017 1321   BUN 15 05/01/2018 1116   CREATININE 0.89 05/01/2018 1116   CALCIUM 9.6 05/01/2018 1116   PROT 6.7 05/01/2018 1116   ALBUMIN 4.2 05/01/2018 1116   AST 18 05/01/2018 1116   ALT 14 05/01/2018 1116   ALKPHOS 73 05/01/2018 1116   BILITOT 0.3 05/01/2018 1116   GFRNONAA 65 05/01/2018 1116   GFRAA 75 05/01/2018 1116    No results found for: SPEP, UPEP  Lab Results  Component Value Date   WBC 7.1 05/01/2018   NEUTROABS 4.8 05/01/2018   HGB 13.6 05/01/2018   HCT 39.8 05/01/2018   MCV 91 05/01/2018   PLT 290 05/01/2018      Chemistry      Component Value Date/Time   NA 140 05/01/2018 1116   K 4.9 05/01/2018 1116   CL 102 05/01/2018 1116   CO2 25 05/01/2018 1116   BUN 15 05/01/2018 1116   CREATININE 0.89 05/01/2018 1116      Component Value Date/Time   CALCIUM 9.6 05/01/2018 1116   ALKPHOS 73 05/01/2018 1116   AST 18 05/01/2018 1116   ALT 14 05/01/2018 1116   BILITOT 0.3 05/01/2018 1116       RADIOGRAPHIC STUDIES: I have personally reviewed the radiological images as listed and agreed with the findings in the report. Nm Pet Image Restag (ps) Skull Base To Thigh  Result Date: 05/26/2018 CLINICAL DATA:  Subsequent treatment strategy for right lung cancer. EXAM: NUCLEAR MEDICINE PET SKULL BASE TO THIGH TECHNIQUE: 12.01 mCi F-18 FDG was injected intravenously. Full-ring PET imaging was performed from the skull base to thigh after the radiotracer. CT data was obtained and used for attenuation correction and anatomic localization. Fasting blood glucose: 94 mg/dl COMPARISON:  CT chest dated 04/27/2018 FINDINGS: Mediastinal blood pool activity: SUV max 2.69 NECK: No hypermetabolic cervical lymphadenopathy. Incidental CT findings: none CHEST: Ground-glass opacity in the medial right upper lobe (series 3/image 116), max SUV 5.1, new from recent CT. This appearance favors radiation changes, less likely mild infection. Focal hypermetabolism in the left hilar region, max SUV 6.4, suspicious for nodal metastasis. Additional 7 mm short axis AP window node (series 3/image 97), max SUV 4.1. Incidental CT findings: Atherosclerotic calcifications of the aortic root/arch. Coronary atherosclerosis of the LAD. ABDOMEN/PELVIS: No abnormal hypermetabolism of the liver, spleen, pancreas, or adrenal glands. 15 mm left adrenal nodule (series 3/image 166), favoring a benign  adrenal adenoma. No hypermetabolic lymphadenopathy in the abdomen/pelvis. Incidental CT findings: Ectasia of the infrarenal abdominal aorta, measuring 3.0 cm (series 3/image 194). Atherosclerotic calcifications of the abdominal aorta and branch vessels. Prior cholecystectomy. SKELETON: No focal hypermetabolic activity to suggest skeletal metastasis. Incidental CT findings: Degenerative changes of the visualized thoracolumbar spine. IMPRESSION: Suspected AP window and left hilar nodal metastases, suggesting recurrent/metastatic disease. Ground-glass opacity in the medial right upper lobe, new from recent CT, favoring radiation changes, less likely mild infection. Electronically Signed   By: Julian Hy M.D.   On: 05/26/2018 11:18     ASSESSMENT & PLAN:  Cancer of lower lobe of right lung (HCC) Squamous cell carcinoma of the right lower lobe lung stage I [ cT1aN0]; S/p definitive RT [Dec 2016]; CT scan 2019 stable right perihilar radiation changes; however contralateral hilar adenopathy noted [see discussion].  Jan 2020 PET scan-suspicious for left hilar uptake/recurrence.  #Discussed that I would recommend a biopsy to confirm recurrence/histology.  We will discuss the tumor conference.  #Discussed that if recurrence is confirmed-chemoradiation likely option.  #Left leg swelling-recent Dopplers negative for DVT.  # DISPOSITION: # TBD  Cc; [place].   # I reviewed the blood work- with the patient in detail; also reviewed the imaging independently [as summarized above]; and with the patient in detail.  We also reviewed the tumor conference.   No orders of the defined types were placed in this encounter.  All questions were answered. The patient knows to call the clinic with any problems, questions or concerns.  Cammie Sickle, MD 05/28/2018 8:14 AM

## 2018-05-28 ENCOUNTER — Other Ambulatory Visit: Payer: Medicare Other

## 2018-05-28 NOTE — Progress Notes (Signed)
Tumor Board Documentation  Cathy Watts was presented by Dr Rogue Bussing at our Tumor Board on 05/28/2018, which included representatives from medical oncology, radiation oncology, surgical, radiology, pathology, navigation, internal medicine, nutrition, research, palliative care, pulmonology.  Cathy Watts currently presents as a current patient with history of the following treatments: neoadjuvant radiation.  Additionally, we reviewed previous medical and familial history, history of present illness, and recent lab results along with all available histopathologic and imaging studies. The tumor board considered available treatment options and made the following recommendations: Biopsy, Concurrent chemo-radiation therapy if biopsy is positive Seeing Pulmonology for biopsy  The following procedures/referrals were also placed: No orders of the defined types were placed in this encounter.   Clinical Trial Status: not discussed   Staging used: AJCC Stage Group  National site-specific guidelines NCCN were discussed with respect to the case.  Tumor board is a meeting of clinicians from various specialty areas who evaluate and discuss patients for whom a multidisciplinary approach is being considered. Final determinations in the plan of care are those of the provider(s). The responsibility for follow up of recommendations given during tumor board is that of the provider.   Today's extended care, comprehensive team conference, Cathy Watts was not present for the discussion and was not examined.   Multidisciplinary Tumor Board is a multidisciplinary case peer review process.  Decisions discussed in the Multidisciplinary Tumor Board reflect the opinions of the specialists present at the conference without having examined the patient.  Ultimately, treatment and diagnostic decisions rest with the primary provider(s) and the patient.

## 2018-05-29 ENCOUNTER — Ambulatory Visit: Payer: Medicare Other | Admitting: Internal Medicine

## 2018-05-29 ENCOUNTER — Encounter: Payer: Self-pay | Admitting: Internal Medicine

## 2018-05-29 VITALS — BP 132/88 | HR 91 | Ht 70.0 in | Wt 219.6 lb

## 2018-05-29 DIAGNOSIS — R918 Other nonspecific abnormal finding of lung field: Secondary | ICD-10-CM | POA: Diagnosis not present

## 2018-05-29 DIAGNOSIS — J449 Chronic obstructive pulmonary disease, unspecified: Secondary | ICD-10-CM | POA: Diagnosis not present

## 2018-05-29 NOTE — H&P (View-Only) (Signed)
Subjective:    Patient ID: ARIEL DIMITRI, female    DOB: 06/06/1945, 73 y.o.   MRN: [id number]  Synopsis: Gold Grade B with moderate airflow obstruction 2015, still smoking as of 05/2015 Recent DX of SQ cell Lung cancer Dx of COPD  CC:  Follow up COPD Abnormal CT chest    HPI  Chronic SOB and DOE-stable No signs of infection at this time No signs of COPD exacerbation  On Treleogy-doing well Stopped smoking 4 months ago  Follow up CT and PET scan shows left hilar mass/adenopathy Results reviewed with patient   The Risks and Benefits of the Bronchoscopy procedure with EBUS were explained to patient  I have discussed the risk for Acute Bleeding, increased chance of Infection, increased chance of Respiratory Failure and Cardiac Arrest, increased chance of pneumothorax and collapsed lung, as well as increased Stroke and Death.  I have also explained to avoid all types of NSAIDs 1 week prior to procedure date  to decrease chance of bleeding, and also to avoid food and drinks the midnight prior to procedure.  The procedure consists of a video camera with a light source to be placed and inserted  into the lungs to  look for abnormal tissue and to obtain tissue samples by using needle and biopsy tools.  The patient/family understand the risks and benefits and have agreed to proceed with procedure.     Review of Systems  Constitutional: Negative for chills, fatigue and fever.  HENT: Negative for congestion.   Respiratory: Negative for cough, choking, chest tightness, shortness of breath and wheezing.   Cardiovascular: Negative for chest pain and leg swelling.  Gastrointestinal: Negative.   All other systems reviewed and are negative.  BP 132/88   Pulse 91   Ht 5\' 10"  (1.778 m)   Wt 219 lb 9.6 oz (99.6 kg)   SpO2 97%   BMI 31.51 kg/m       Objective:   Physical Exam  Constitutional: She is oriented to person, place, and time. She appears well-developed and  well-nourished. No distress.  HENT:  Head: Normocephalic and atraumatic.  Eyes: Pupils are equal, round, and reactive to light. EOM are normal.  Neck: Normal range of motion.  Cardiovascular: Normal rate, regular rhythm and normal heart sounds.  Pulmonary/Chest: Effort normal. No stridor. No respiratory distress. She has no wheezes. She has no rales.  Abdominal: Soft.  Neurological: She is alert and oriented to person, place, and time.  Skin: She is not diaphoretic.         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 Gold stage a with chronic hypoxic respiratory failure  COPD Gold stage a Stable at this time continue triple therapy inhaler Albuterol as needed  Chronic hypoxic respiratory failure from COPD\ Continue oxygen as prescribed Patient uses and benefits from oxygen therapy   Previous history of lung cancer now with abnormal CT chest of left hilar mass and left lung adenopathy-recurrent lung cancer versus inflammatory process Plan for EBUS Risk and benefits explained to the patient    The Risks and Benefits of the Bronchoscopy procedure with EBUS were explained to patient  I have discussed the risk for Acute Bleeding, increased chance of Infection, increased chance of Respiratory Failure and Cardiac Arrest, increased chance of pneumothorax and collapsed lung, as well as increased Stroke and Death.  I have also explained to avoid all types of  NSAIDs 1 week prior to procedure date  to decrease chance of bleeding, and also to avoid food and drinks the midnight prior to procedure.  The procedure consists of a video camera with a light source to be placed and inserted  into the lungs to  look for abnormal tissue and to obtain tissue samples by using needle and biopsy tools.  The patient/family understand the risks and benefits and have agreed to proceed with procedure.  Plan for procedure  next Friday, 06/05/2018   Patient satisfied with Plan of action and management. All questions answered  Corrin Parker, M.D.  [facility]  Medical Director [place] Director [facility] Cardio-Pulmonary Department

## 2018-05-29 NOTE — Patient Instructions (Signed)
Plan For ENDOBRONCHIAL ULTRASOUND   The Risks and Benefits of the Bronchoscopy procedure with EBUS were explained to patient/family.  I have discussed the risk for Acute Bleeding, increased chance of Infection, increased chance of Respiratory Failure and Cardiac Arrest, increased chance of pneumothorax and collapsed lung, as well as increased Stroke and Death.  I have also explained to avoid all types of NSAIDs 1 week prior to procedure date  to decrease chance of bleeding, and also to avoid food and drinks the midnight prior to procedure.  The procedure consists of a video camera with a light source to be placed and inserted  into the lungs to  look for abnormal tissue and to obtain tissue samples by using needle and biopsy tools.  The patient/family understand the risks and benefits and have agreed to proceed with procedure.

## 2018-05-29 NOTE — Progress Notes (Signed)
Subjective:    Patient ID: ADRINA ARMIJO, female    DOB: 05-25-1945, 73 y.o.   MRN: [id number]  Synopsis: Gold Grade B with moderate airflow obstruction 2015, still smoking as of 05/2015 Recent DX of SQ cell Lung cancer Dx of COPD  CC:  Follow up COPD Abnormal CT chest    HPI  Chronic SOB and DOE-stable No signs of infection at this time No signs of COPD exacerbation  On Treleogy-doing well Stopped smoking 4 months ago  Follow up CT and PET scan shows left hilar mass/adenopathy Results reviewed with patient   The Risks and Benefits of the Bronchoscopy procedure with EBUS were explained to patient  I have discussed the risk for Acute Bleeding, increased chance of Infection, increased chance of Respiratory Failure and Cardiac Arrest, increased chance of pneumothorax and collapsed lung, as well as increased Stroke and Death.  I have also explained to avoid all types of NSAIDs 1 week prior to procedure date  to decrease chance of bleeding, and also to avoid food and drinks the midnight prior to procedure.  The procedure consists of a video camera with a light source to be placed and inserted  into the lungs to  look for abnormal tissue and to obtain tissue samples by using needle and biopsy tools.  The patient/family understand the risks and benefits and have agreed to proceed with procedure.     Review of Systems  Constitutional: Negative for chills, fatigue and fever.  HENT: Negative for congestion.   Respiratory: Negative for cough, choking, chest tightness, shortness of breath and wheezing.   Cardiovascular: Negative for chest pain and leg swelling.  Gastrointestinal: Negative.   All other systems reviewed and are negative.  BP 132/88   Pulse 91   Ht 5\' 10"  (1.778 m)   Wt 219 lb 9.6 oz (99.6 kg)   SpO2 97%   BMI 31.51 kg/m       Objective:   Physical Exam  Constitutional: She is oriented to person, place, and time. She appears well-developed and  well-nourished. No distress.  HENT:  Head: Normocephalic and atraumatic.  Eyes: Pupils are equal, round, and reactive to light. EOM are normal.  Neck: Normal range of motion.  Cardiovascular: Normal rate, regular rhythm and normal heart sounds.  Pulmonary/Chest: Effort normal. No stridor. No respiratory distress. She has no wheezes. She has no rales.  Abdominal: Soft.  Neurological: She is alert and oriented to person, place, and time.  Skin: She is not diaphoretic.         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 Gold stage a with chronic hypoxic respiratory failure  COPD Gold stage a Stable at this time continue triple therapy inhaler Albuterol as needed  Chronic hypoxic respiratory failure from COPD\ Continue oxygen as prescribed Patient uses and benefits from oxygen therapy   Previous history of lung cancer now with abnormal CT chest of left hilar mass and left lung adenopathy-recurrent lung cancer versus inflammatory process Plan for EBUS Risk and benefits explained to the patient    The Risks and Benefits of the Bronchoscopy procedure with EBUS were explained to patient  I have discussed the risk for Acute Bleeding, increased chance of Infection, increased chance of Respiratory Failure and Cardiac Arrest, increased chance of pneumothorax and collapsed lung, as well as increased Stroke and Death.  I have also explained to avoid all types of  NSAIDs 1 week prior to procedure date  to decrease chance of bleeding, and also to avoid food and drinks the midnight prior to procedure.  The procedure consists of a video camera with a light source to be placed and inserted  into the lungs to  look for abnormal tissue and to obtain tissue samples by using needle and biopsy tools.  The patient/family understand the risks and benefits and have agreed to proceed with procedure.  Plan for procedure  next Friday, 06/05/2018   Patient satisfied with Plan of action and management. All questions answered  Corrin Parker, M.D.  [facility]  Medical Director [place] Director [facility] Cardio-Pulmonary Department

## 2018-06-02 ENCOUNTER — Other Ambulatory Visit: Payer: Self-pay

## 2018-06-02 ENCOUNTER — Encounter
Admission: RE | Admit: 2018-06-02 | Discharge: 2018-06-02 | Disposition: A | Discharge status: Home / Self Care | Payer: Medicare Other | Source: Ambulatory Visit | Attending: Internal Medicine | Admitting: Internal Medicine

## 2018-06-02 DIAGNOSIS — I10 Essential (primary) hypertension: Secondary | ICD-10-CM | POA: Insufficient documentation

## 2018-06-02 DIAGNOSIS — J9611 Chronic respiratory failure with hypoxia: Secondary | ICD-10-CM | POA: Diagnosis not present

## 2018-06-02 DIAGNOSIS — J449 Chronic obstructive pulmonary disease, unspecified: Secondary | ICD-10-CM | POA: Diagnosis not present

## 2018-06-02 DIAGNOSIS — Z87891 Personal history of nicotine dependence: Secondary | ICD-10-CM | POA: Diagnosis not present

## 2018-06-02 DIAGNOSIS — Z85118 Personal history of other malignant neoplasm of bronchus and lung: Secondary | ICD-10-CM | POA: Diagnosis not present

## 2018-06-02 DIAGNOSIS — C3402 Malignant neoplasm of left main bronchus: Secondary | ICD-10-CM | POA: Diagnosis not present

## 2018-06-02 DIAGNOSIS — Z01818 Encounter for other preprocedural examination: Secondary | ICD-10-CM | POA: Insufficient documentation

## 2018-06-02 DIAGNOSIS — R918 Other nonspecific abnormal finding of lung field: Secondary | ICD-10-CM | POA: Diagnosis present

## 2018-06-02 HISTORY — DX: Abdominal aortic aneurysm, without rupture: I71.4

## 2018-06-02 HISTORY — DX: Dependence on supplemental oxygen: Z99.81

## 2018-06-02 NOTE — Patient Instructions (Signed)
Your procedure is scheduled on: Friday, June 05, 2018  Report to [facility]     DO NOT STOP ON THE FIRST FLOOR TO REGISTER  To find out your arrival time please call [phone] between 1PM - 3PM on Thursday, June 04, 2018  Remember: Instructions that are not followed completely may result in serious medical risk,  up to and including death, or upon the discretion of your surgeon and anesthesiologist your  surgery may need to be rescheduled.     _X__ 1. Do not eat food after midnight the night before your procedure.                 No gum chewing or hard candies.                    ABSOLUTELY NOTHING SOLID IN YOUR MOUTH AFTER MIDNIGHT                  You may drink clear liquids up to 2 hours before you are scheduled to arrive for your surgery-                  DO not drink clear liquids within 2 hours of the start of your surgery.                  Clear Liquids include:  water, apple juice without pulp, clear carbohydrate                 drink such as Clearfast of Gatorade, Black Coffee or Tea (Do not add                 anything to coffee or tea).NO MILK OR DAIRY PRODUCTS ALLOWED  __X__2.  On the morning of surgery brush your teeth with toothpaste and water,                   You may rinse your mouth with mouthwash if you wish.                       Do not swallow any toothpaste of mouthwash.     _X__ 3.  No Alcohol for 24 hours before or after surgery.   _X__ 4.  Do Not Smoke or use e-cigarettes For 24 Hours Prior to Your Surgery.                 Do not use any chewable tobacco products for at least 6 hours prior to                 surgery.  ____  5.  Bring all medications with you on the day of surgery if instructed.   ____  6.  Notify your doctor if there is any change in your medical condition      (cold, fever, infections).     Do not wear jewelry, make-up, hairpins, clips or nail polish. Do not wear lotions,  powders, or perfumes. You may wear deodorant. Do not shave 48 hours prior to surgery. Men may shave face and neck. Do not bring valuables to the hospital.    [facility] is not responsible for any belongings or valuables.  Contacts, dentures or bridgework may not be worn into surgery. Leave your suitcase in the car. After surgery it may be brought to your room. For patients admitted to the hospital, discharge time is determined by your treatment team.  Patients discharged the day of surgery will not be allowed to drive home.   Please read over the following fact sheets that you were given:   PREPARING FOR SURGERY   ____ Take these medicines the morning of surgery with A SIP OF WATER:    1.TRELEGY INHALER  2. WELLBUTRIN  3. BUSPAR  4. LEXAPRO  5. PRILOSEC  6.  ____ Fleet Enema (as directed)   ____ Use ANTIBACTERIAL SOAP as directed  _X___ Use inhalers on the day of surgery  _X__ Stop ALL ASPIRIN PRODUCTS AS OF TODAY  _X___ Stop Anti-inflammatories AS OF TODAY   __X__ Stop supplements until after surgery.                STOP FISHOIL / MAGNESIUM/ CALCIUM - VITAMIN D  ____ Bring C-Pap to the hospital.   YOU MAY CONTINUE TAKING MIRALAX BUT DO NOT TAKE ON THE      MORNING OF SURGERY.  HAVE COOL SOFT FOODS AVAILABLE FOR HOME AFTER YOUR PROCEDURE.  CONTINUE TAKING YOUR EVENING MEDICINES AS USUAL.

## 2018-06-05 ENCOUNTER — Ambulatory Visit: Payer: Medicare Other | Admitting: Internal Medicine

## 2018-06-05 ENCOUNTER — Encounter
Admission: RE | Disposition: A | Discharge status: Home / Self Care | Payer: Self-pay | Source: Home / Self Care | Attending: Internal Medicine

## 2018-06-05 ENCOUNTER — Other Ambulatory Visit: Payer: Self-pay

## 2018-06-05 ENCOUNTER — Ambulatory Visit: Payer: Medicare Other | Admitting: Certified Registered Nurse Anesthetist

## 2018-06-05 ENCOUNTER — Ambulatory Visit
Admission: RE | Admit: 2018-06-05 | Discharge: 2018-06-05 | Disposition: A | Discharge status: Home / Self Care | Payer: Medicare Other | Attending: Internal Medicine | Admitting: Internal Medicine

## 2018-06-05 ENCOUNTER — Encounter: Payer: Self-pay | Admitting: *Deleted

## 2018-06-05 DIAGNOSIS — J9611 Chronic respiratory failure with hypoxia: Secondary | ICD-10-CM | POA: Insufficient documentation

## 2018-06-05 DIAGNOSIS — J449 Chronic obstructive pulmonary disease, unspecified: Secondary | ICD-10-CM | POA: Insufficient documentation

## 2018-06-05 DIAGNOSIS — C3402 Malignant neoplasm of left main bronchus: Secondary | ICD-10-CM | POA: Insufficient documentation

## 2018-06-05 DIAGNOSIS — R918 Other nonspecific abnormal finding of lung field: Secondary | ICD-10-CM

## 2018-06-05 DIAGNOSIS — Z85118 Personal history of other malignant neoplasm of bronchus and lung: Secondary | ICD-10-CM | POA: Insufficient documentation

## 2018-06-05 DIAGNOSIS — Z87891 Personal history of nicotine dependence: Secondary | ICD-10-CM | POA: Insufficient documentation

## 2018-06-05 HISTORY — PX: ENDOBRONCHIAL ULTRASOUND: SHX5096

## 2018-06-05 SURGERY — ENDOBRONCHIAL ULTRASOUND (EBUS)
Anesthesia: General | Laterality: Left

## 2018-06-05 MED ORDER — ROCURONIUM BROMIDE 50 MG/5ML IV SOLN
INTRAVENOUS | Status: AC
  Filled 2018-06-05: qty 1

## 2018-06-05 MED ORDER — LIDOCAINE HCL 4 % MT SOLN
OROMUCOSAL | Status: DC | PRN
  Administered 2018-06-05: 4 mL via TOPICAL

## 2018-06-05 MED ORDER — FENTANYL CITRATE (PF) 100 MCG/2ML IJ SOLN
INTRAMUSCULAR | Status: DC | PRN
  Administered 2018-06-05 (×2): 50 ug via INTRAVENOUS

## 2018-06-05 MED ORDER — PROPOFOL 10 MG/ML IV BOLUS
INTRAVENOUS | Status: AC
  Filled 2018-06-05: qty 20

## 2018-06-05 MED ORDER — DEXAMETHASONE SODIUM PHOSPHATE 10 MG/ML IJ SOLN
INTRAMUSCULAR | Status: DC | PRN
  Administered 2018-06-05: 5 mg via INTRAVENOUS

## 2018-06-05 MED ORDER — ONDANSETRON HCL 4 MG/2ML IJ SOLN
INTRAMUSCULAR | Status: DC | PRN
  Administered 2018-06-05: 4 mg via INTRAVENOUS

## 2018-06-05 MED ORDER — ROCURONIUM BROMIDE 100 MG/10ML IV SOLN
INTRAVENOUS | Status: DC | PRN
  Administered 2018-06-05: 40 mg via INTRAVENOUS

## 2018-06-05 MED ORDER — FENTANYL CITRATE (PF) 100 MCG/2ML IJ SOLN: 25.0000 ug | INTRAMUSCULAR | Status: DC | PRN

## 2018-06-05 MED ORDER — DEXAMETHASONE SODIUM PHOSPHATE 10 MG/ML IJ SOLN
INTRAMUSCULAR | Status: AC
  Filled 2018-06-05: qty 1

## 2018-06-05 MED ORDER — PROPOFOL 10 MG/ML IV BOLUS
INTRAVENOUS | Status: DC | PRN
  Administered 2018-06-05: 150 mg via INTRAVENOUS

## 2018-06-05 MED ORDER — ONDANSETRON HCL 4 MG/2ML IJ SOLN: 4.0000 mg | Freq: Once | INTRAMUSCULAR | Status: DC | PRN

## 2018-06-05 MED ORDER — LIDOCAINE HCL (PF) 1 % IJ SOLN
INTRAMUSCULAR | Status: AC
  Filled 2018-06-05: qty 5

## 2018-06-05 MED ORDER — SUGAMMADEX SODIUM 200 MG/2ML IV SOLN
INTRAVENOUS | Status: DC | PRN
  Administered 2018-06-05: 200 mg via INTRAVENOUS

## 2018-06-05 MED ORDER — LIDOCAINE HCL (CARDIAC) PF 100 MG/5ML IV SOSY
PREFILLED_SYRINGE | INTRAVENOUS | Status: DC | PRN
  Administered 2018-06-05: 100 mg via INTRAVENOUS

## 2018-06-05 MED ORDER — MIDAZOLAM HCL 2 MG/2ML IJ SOLN
INTRAMUSCULAR | Status: AC
  Filled 2018-06-05: qty 2

## 2018-06-05 MED ORDER — FENTANYL CITRATE (PF) 100 MCG/2ML IJ SOLN
INTRAMUSCULAR | Status: AC
  Filled 2018-06-05: qty 2

## 2018-06-05 MED ORDER — ONDANSETRON HCL 4 MG/2ML IJ SOLN
INTRAMUSCULAR | Status: AC
  Filled 2018-06-05: qty 2

## 2018-06-05 MED ORDER — MIDAZOLAM HCL 2 MG/2ML IJ SOLN
INTRAMUSCULAR | Status: DC | PRN
  Administered 2018-06-05: 2 mg via INTRAVENOUS

## 2018-06-05 MED ORDER — LACTATED RINGERS IV SOLN
INTRAVENOUS | Status: DC
  Administered 2018-06-05: 13:00:00 via INTRAVENOUS

## 2018-06-05 MED ORDER — SUGAMMADEX SODIUM 200 MG/2ML IV SOLN
INTRAVENOUS | Status: AC
  Filled 2018-06-05: qty 2

## 2018-06-05 NOTE — Anesthesia Postprocedure Evaluation (Signed)
Anesthesia Post Note  Patient: Cathy Watts  Procedure(s) Performed: ENDOBRONCHIAL ULTRASOUND (Left )  Patient location during evaluation: PACU Anesthesia Type: General Level of consciousness: awake and alert and oriented Pain management: pain level controlled Vital Signs Assessment: post-procedure vital signs reviewed and stable Respiratory status: spontaneous breathing, nonlabored ventilation and respiratory function stable Cardiovascular status: blood pressure returned to baseline and stable Postop Assessment: no signs of nausea or vomiting Anesthetic complications: no     Last Vitals:  Vitals:   06/05/18 1400 06/05/18 1414  BP: 133/77 123/63  Pulse: 82 82  Resp: 13 13  Temp:    SpO2: 97% 96%    Last Pain:  Vitals:   06/05/18 1414  TempSrc:   PainSc: 0-No pain                 Breleigh Carpino

## 2018-06-05 NOTE — Anesthesia Preprocedure Evaluation (Signed)
Anesthesia Evaluation  Patient identified by MRN, date of birth, ID band Patient awake    Reviewed: Allergy & Precautions, NPO status , Patient's Chart, lab work & pertinent test results  History of Anesthesia Complications Negative for: history of anesthetic complications  Airway Mallampati: II  TM Distance: >3 FB Neck ROM: Full    Dental no notable dental hx.    Pulmonary asthma , sleep apnea and Oxygen sleep apnea , COPD (uses 2L at night),  oxygen dependent, former smoker,    breath sounds clear to auscultation- rhonchi (-) wheezing      Cardiovascular (-) hypertension(-) CAD, (-) Past MI, (-) Cardiac Stents and (-) CABG  Rhythm:Regular Rate:Normal - Systolic murmurs and - Diastolic murmurs    Neuro/Psych neg Seizures PSYCHIATRIC DISORDERS Anxiety Depression negative neurological ROS     GI/Hepatic Neg liver ROS, GERD  ,  Endo/Other  negative endocrine ROSneg diabetes  Renal/GU negative Renal ROS     Musculoskeletal negative musculoskeletal ROS (+)   Abdominal (+) + obese,   Peds  Hematology negative hematology ROS (+)   Anesthesia Other Findings Past Medical History: 05/2018: Abdominal aortic aneurysm (AAA) 3.0 cm to 5.0 cm in diameter  in female [facility])     Comment:  being followed by dr. Delana Meyer. will reultrasound in 1               year No date: Anxiety No date: Asthma No date: Barrett's esophagus No date: COPD (chronic obstructive pulmonary disease) (HCC) No date: Depression No date: GERD (gastroesophageal reflux disease) No date: History of peptic ulcer disease No date: Hypercholesterolemia No date: Low oxygen saturation     Comment:  2l hs No date: Osteopenia No date: Panic disorder No date: Personal history of radiation therapy 01/16/2015: Personal history of tobacco use, presenting hazards to  health 05/2018: Requires supplemental oxygen     Comment:  supposed to use 2L NP at night but she  currently does               not.  encouraged patient to resume this No date: Sleep apnea     Comment:  snores significantly but has never been tested for OSA No date: SOB (shortness of breath) 2015: Squamous cell carcinoma of right lung (HCC)   Reproductive/Obstetrics                             Anesthesia Physical Anesthesia Plan  ASA: III  Anesthesia Plan: General   Post-op Pain Management:    Induction: Intravenous  PONV Risk Score and Plan: 2 and Ondansetron and Midazolam  Airway Management Planned: Oral ETT  Additional Equipment:   Intra-op Plan:   Post-operative Plan: Extubation in OR  Informed Consent: I have reviewed the patients History and Physical, chart, labs and discussed the procedure including the risks, benefits and alternatives for the proposed anesthesia with the patient or authorized representative who has indicated his/her understanding and acceptance.     Dental advisory given  Plan Discussed with: CRNA and Anesthesiologist  Anesthesia Plan Comments:         Anesthesia Quick Evaluation

## 2018-06-05 NOTE — Transfer of Care (Signed)
Immediate Anesthesia Transfer of Care Note  Patient: Cathy Watts  Procedure(s) Performed: ENDOBRONCHIAL ULTRASOUND (Left )  Patient Location: PACU  Anesthesia Type:General  Level of Consciousness: awake, alert , oriented and patient cooperative  Airway & Oxygen Therapy: Patient Spontanous Breathing and Patient connected to face mask oxygen  Post-op Assessment: Report given to RN and Post -op Vital signs reviewed and stable  Post vital signs: Reviewed and stable  Last Vitals:  Vitals Value Taken Time  BP    Temp    Pulse 86 06/05/2018  1:58 PM  Resp 13 06/05/2018  1:58 PM  SpO2 99 % 06/05/2018  1:58 PM  Vitals shown include unvalidated device data.  Last Pain:  Vitals:   06/05/18 1204  TempSrc: Oral  PainSc: 0-No pain         Complications: No apparent anesthesia complications

## 2018-06-05 NOTE — Discharge Instructions (Signed)
AMBULATORY SURGERY  DISCHARGE INSTRUCTIONS   1) The drugs that you were given will stay in your system until tomorrow so for the next 24 hours you should not:  A) Drive an automobile B) Make any legal decisions C) Drink any alcoholic beverage   2) You may resume regular meals tomorrow.  Today it is better to start with liquids and gradually work up to solid foods.  You may eat anything you prefer, but it is better to start with liquids, then soup and crackers, and gradually work up to solid foods.   3) Please notify your doctor immediately if you have any unusual bleeding, trouble breathing, redness and pain at the surgery site, drainage, fever, or pain not relieved by medication. 4)   5) Your post-operative visit with Dr.                                     is: Date:                        Time:    Please call to schedule your post-operative visit.  6) Additional Instructions:     Flexible Bronchoscopy, Care After This sheet gives you information about how to care for yourself after your test. Your doctor may also give you more specific instructions. If you have problems or questions, contact your doctor. Follow these instructions at home: Eating and drinking   ? Eat only soft foods. ? Slowly drink liquids.  The day after the test, go back to your normal diet. Driving  Do not drive for 24 hours if you were given a medicine to help you relax (sedative).  Do not drive or use heavy machinery while taking prescription pain medicine. General instructions   Take over-the-counter and prescription medicines only as told by your doctor.  Return to your normal activities as told. Ask what activities are safe for you.  Do not use any products that have nicotine or tobacco in them. This includes cigarettes and e-cigarettes. If you need help quitting, ask your doctor.  Keep all follow-up visits as told by your doctor. This is important. It is very important if you had a  tissue sample (biopsy) taken. Get help right away if:  You have shortness of breath that gets worse.  You get light-headed.  You feel like you are going to pass out (faint).  You have chest pain.  You cough up: ? More than a little blood. ? More blood than before.     Do not use cigarettes. Do not use e-cigarettes.  Get help right away if you have chest pain. This information is not intended to replace advice given to you by your health care provider. Make sure you discuss any questions you have with your health care provider. Document Released: 02/17/2009 Document Revised: 05/10/2016 Document Reviewed: 05/10/2016 Elsevier Interactive Patient Education  2019 [employer].

## 2018-06-05 NOTE — Anesthesia Procedure Notes (Signed)
Procedure Name: Intubation Date/Time: 06/05/2018 1:14 PM Performed by: Eben Burow, CRNA Pre-anesthesia Checklist: Patient identified, Emergency Drugs available, Suction available, Patient being monitored and Timeout performed Patient Re-evaluated:Patient Re-evaluated prior to induction Oxygen Delivery Method: Circle system utilized Preoxygenation: Pre-oxygenation with 100% oxygen Induction Type: IV induction Ventilation: Mask ventilation without difficulty and Oral airway inserted - appropriate to patient size Laryngoscope Size: Sabra Heck and 2 Grade View: Grade I Tube type: Oral Tube size: 8.5 mm Number of attempts: 1 Airway Equipment and Method: Stylet and LTA kit utilized Placement Confirmation: ETT inserted through vocal cords under direct vision,  positive ETCO2 and breath sounds checked- equal and bilateral Secured at: 22 cm Tube secured with: Tape Dental Injury: Teeth and Oropharynx as per pre-operative assessment

## 2018-06-05 NOTE — Op Note (Signed)
PROCEDURE: ENDOBRONCHIAL ULTRASOUND   PROCEDURE DATE: 06/05/2018  TIME:  NAME:  Cathy Watts  DOB:02/02/1946  MRN: [id number]    Indications/Preliminary Diagnosis:Adenopathy/left hilar mass  Consent: (Place X beside choice/s below)  The benefits, risks and possible complications of the procedure were        explained to:  _x__ patient  _x__ patient's family  ___ other:___________  who verbalized understanding and gave:  ___ verbal  ___ written  _x__ verbal and written  ___ telephone  ___ other:________ consent.      Unable to obtain consent; procedure performed on emergent basis.     Other:       PRESEDATION ASSESSMENT: History and Physical has been performed. Patient meds and allergies have been reviewed. Presedation airway examination has been performed and documented. Baseline vital signs, sedation score, oxygenation status, and cardiac rhythm were reviewed. Patient was deemed to be in satisfactory condition to undergo the procedure.    PREMEDICATIONS: SEE ANESTHESIOLOGY RECORDS   Insertion Route (Place X beside choice below)   Nasal   Oral  x Endotracheal Tube   Tracheostomy   INTRAPROCEDURE MEDICATIONS: SEE ANESTHESIOLOGY RECORDS   PROCEDURE DETAILS: Timeout performed and correct patient, name, & ID confirmed. Following prep per Pulmonary policy, appropriate sedation was administered.  I proceeded with introducing the endobronchial [place] scope and findings, technical procedures, and specimen collection as noted below. At the end of exam the scope was withdrawn without incident. Impression and Plan as noted below.   SPECIMENS (Sites): (Place X beside choice below)  Specimens Description   No Specimens Obtained     Washings    Lavage    Biopsies   x Fine Needle Aspirates 8 FNA transbronchial biopsies   Brushings    Sputum    FINDINGS: left hilar mass 3x2 cm visualized under Ultrasound Several samples were taken  ESTIMATED BLOOD LOSS:  none COMPLICATIONS/RESOLUTION: none       IMPRESSION:POST-PROCEDURE DX: left hilar mass    RECOMMENDATION/PLAN:  Follow up Pathology Reports     Corrin Parker, M.D.  [facility]  Medical Director [place] Director [place] Department

## 2018-06-05 NOTE — Anesthesia Post-op Follow-up Note (Signed)
Anesthesia QCDR form completed.        

## 2018-06-05 NOTE — Interval H&P Note (Signed)
History and Physical Interval Note:  06/05/2018 12:53 PM  Cathy Watts  has presented today for surgery, with the diagnosis of LEFT HILAR MASS  The various methods of treatment have been discussed with the patient and family. After consideration of risks, benefits and other options for treatment, the patient has consented to  Procedure(s): ENDOBRONCHIAL ULTRASOUND (Left) as a surgical intervention .  The patient's history has been reviewed, patient examined, no change in status, stable for surgery.  I have reviewed the patient's chart and labs.  Questions were answered to the patient's satisfaction.     The Risks and Benefits of the Bronchoscopy procedure with EBUS were explained to patient/family.  I have discussed the risk for Acute Bleeding, increased chance of Infection, increased chance of Respiratory Failure and Cardiac Arrest, increased chance of pneumothorax and collapsed lung, as well as increased Stroke and Death.  I have also explained to avoid all types of NSAIDs 1 week prior to procedure date  to decrease chance of bleeding, and also to avoid food and drinks the midnight prior to procedure.  The procedure consists of a video camera with a light source to be placed and inserted  into the lungs to  look for abnormal tissue and to obtain tissue samples by using needle and biopsy tools.  The patient/family understand the risks and benefits and have agreed to proceed with procedure.  Flora Lipps

## 2018-06-09 ENCOUNTER — Other Ambulatory Visit: Payer: Self-pay | Admitting: Anatomic Pathology & Clinical Pathology

## 2018-06-09 LAB — CYTOLOGY - NON PAP

## 2018-06-10 ENCOUNTER — Inpatient Hospital Stay: Payer: Medicare Other | Attending: Internal Medicine | Admitting: Internal Medicine

## 2018-06-10 ENCOUNTER — Encounter: Payer: Self-pay | Admitting: *Deleted

## 2018-06-10 ENCOUNTER — Encounter: Payer: Self-pay | Admitting: Internal Medicine

## 2018-06-10 VITALS — BP 152/89 | HR 96 | Temp 97.2°F | Resp 16 | Wt 214.2 lb

## 2018-06-10 DIAGNOSIS — Z9981 Dependence on supplemental oxygen: Secondary | ICD-10-CM | POA: Diagnosis not present

## 2018-06-10 DIAGNOSIS — Z803 Family history of malignant neoplasm of breast: Secondary | ICD-10-CM | POA: Insufficient documentation

## 2018-06-10 DIAGNOSIS — C3431 Malignant neoplasm of lower lobe, right bronchus or lung: Secondary | ICD-10-CM | POA: Diagnosis not present

## 2018-06-10 DIAGNOSIS — I714 Abdominal aortic aneurysm, without rupture: Secondary | ICD-10-CM | POA: Insufficient documentation

## 2018-06-10 DIAGNOSIS — Z801 Family history of malignant neoplasm of trachea, bronchus and lung: Secondary | ICD-10-CM | POA: Diagnosis not present

## 2018-06-10 DIAGNOSIS — Z8041 Family history of malignant neoplasm of ovary: Secondary | ICD-10-CM | POA: Insufficient documentation

## 2018-06-10 DIAGNOSIS — J449 Chronic obstructive pulmonary disease, unspecified: Secondary | ICD-10-CM | POA: Diagnosis not present

## 2018-06-10 DIAGNOSIS — Z79899 Other long term (current) drug therapy: Secondary | ICD-10-CM | POA: Diagnosis not present

## 2018-06-10 DIAGNOSIS — Z800 Family history of malignant neoplasm of digestive organs: Secondary | ICD-10-CM | POA: Insufficient documentation

## 2018-06-10 DIAGNOSIS — Z5111 Encounter for antineoplastic chemotherapy: Secondary | ICD-10-CM | POA: Insufficient documentation

## 2018-06-10 DIAGNOSIS — Z87891 Personal history of nicotine dependence: Secondary | ICD-10-CM | POA: Diagnosis not present

## 2018-06-10 DIAGNOSIS — C3402 Malignant neoplasm of left main bronchus: Secondary | ICD-10-CM

## 2018-06-10 MED ORDER — LIDOCAINE-PRILOCAINE 2.5-2.5 % EX CREA: 1.0000 "application " | TOPICAL_CREAM | CUTANEOUS | 0 refills | Status: DC | PRN

## 2018-06-10 MED ORDER — PROCHLORPERAZINE MALEATE 10 MG PO TABS: 10.0000 mg | ORAL_TABLET | Freq: Four times a day (QID) | ORAL | 2 refills | Status: DC | PRN

## 2018-06-10 MED ORDER — ONDANSETRON HCL 8 MG PO TABS: 8.0000 mg | ORAL_TABLET | Freq: Three times a day (TID) | ORAL | 2 refills | Status: DC | PRN

## 2018-06-10 NOTE — Progress Notes (Signed)
START ON PATHWAY REGIMEN - Small Cell Lung     A cycle is every 21 days:     Etoposide      Carboplatin   **Always confirm dose/schedule in your pharmacy ordering system**  Patient Characteristics: Newly Diagnosed, Preoperative or Nonsurgical Candidate (Clinical Staging), First Line, Limited Stage, Nonsurgical Candidate Therapeutic Status: Newly Diagnosed, Preoperative or Nonsurgical Candidate (Clinical Staging) AJCC T Category: cT1c AJCC N Category: cN1 AJCC M Category: cM0 AJCC 8 Stage Grouping: IIB Stage Classification: Limited Surgical Candidacy: Nonsurgical Candidate Intent of Therapy: Curative Intent, Discussed with Patient

## 2018-06-10 NOTE — Progress Notes (Signed)
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 NOV 15th CT NED  # DEC-JAN 2020-CT/PET-Left hilar/mediastinal shift large cell neuroendocrine; Limited stage.  # Feb 2020- carbo-Etop with RT  # COPD on 02 at night; hx of smoking.  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 NOV 15th CT NED  # DEC-JAN 2020-CT/PET-Left hilar/mediastinal shift large cell neuroendocrine; Limited stage.  # Feb 2020- carbo-Etop with RT  # COPD on 02 at night; hx of smoking.  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      Cancer of lower lobe of right lung ([place])     INTERVAL HISTORY:  Cathy Watts 73 y.o.  female pleasant patient above history of right lower lobe stage I lung cancer status post SBRT is here to review the results of her biopsy.  Patient noted to have a left hilar lymph node/concerning for malignancy on PET scan.  She went on to have a bronchoscopy with biopsy.  Patient continues with chronic shortness of breath chronic cough.  Not any worse.  No hemoptysis.  No headaches.  Review of Systems  Constitutional: Positive for malaise/fatigue. Negative for chills, diaphoresis, fever and weight loss.  HENT: Negative for nosebleeds and sore throat.   Eyes: Negative for double vision.  Respiratory: Positive for cough and shortness of breath. Negative for hemoptysis, sputum production and wheezing.   Cardiovascular: Negative for chest pain, palpitations, orthopnea and leg swelling.  Gastrointestinal: Negative for abdominal pain, blood in stool, constipation, diarrhea,  heartburn, melena, nausea and vomiting.  Genitourinary: Negative for dysuria, frequency and urgency.  Musculoskeletal: Negative for back pain and joint pain.  Skin: Negative.  Negative for itching and rash.  Neurological: Negative for dizziness, tingling, focal weakness, weakness and headaches.  Endo/Heme/Allergies: Does not bruise/bleed easily.  Psychiatric/Behavioral: Negative for depression. The patient is not nervous/anxious and does not have insomnia.       PAST MEDICAL HISTORY :  Past Medical History:  Diagnosis Date  . Abdominal aortic aneurysm (AAA) 3.0 cm to 5.0 cm in diameter in female [facility]) 05/2018   being followed by dr. Delana Meyer. will reultrasound in 1 year  . Anxiety   . Asthma   . Barrett's esophagus   . COPD (chronic obstructive pulmonary disease) ([place])   . Depression   . GERD (gastroesophageal reflux disease)   . History of peptic ulcer disease   . Hypercholesterolemia   . Low oxygen saturation    2l hs  . Osteopenia   . Panic disorder   . Personal history of radiation therapy   . Personal history of tobacco use, presenting hazards to health 01/16/2015  . Requires supplemental oxygen 05/2018   supposed to use 2L NP at night but she currently does not.  encouraged patient to resume this  . Sleep apnea    snores significantly but has never been tested for OSA  . SOB (shortness of breath)   . Squamous cell carcinoma of right lung ([place]) 2015    PAST SURGICAL HISTORY :   Past Surgical History:  Procedure Laterality Date  . CERVICAL CONE BIOPSY  1990  . CHOLECYSTECTOMY  1980's  . colonoscopy  2014  . COLONOSCOPY  07/21/2012  . COLONOSCOPY WITH PROPOFOL N/A 05/12/2018   Procedure: COLONOSCOPY WITH PROPOFOL;  Surgeon: Lollie Sails, MD;  Location: [facility] ENDOSCOPY;  Service: Endoscopy;  Laterality: N/A;  . ELBOW FRACTURE SURGERY Right 2009  . ENDOBRONCHIAL ULTRASOUND N/A 02/07/2015   Procedure: ENDOBRONCHIAL ULTRASOUND;  Surgeon: Flora Lipps, MD;  Location: [facility]  ORS;  Service: Cardiopulmonary;  Laterality: N/A;  . ENDOBRONCHIAL ULTRASOUND Left 06/05/2018   Procedure: ENDOBRONCHIAL ULTRASOUND;  Surgeon: Flora Lipps, MD;  Location: [facility] ORS;  Service: Cardiopulmonary;  Laterality: Left;  . ESOPHAGOGASTRODUODENOSCOPY (EGD) WITH PROPOFOL N/A 02/26/2016   Procedure: ESOPHAGOGASTRODUODENOSCOPY (EGD) WITH PROPOFOL;  Surgeon: Lollie Sails, MD;  Location: [facility] ENDOSCOPY;  Service: Endoscopy;  Laterality: N/A;  COPD, Sleep Apnea  . ESOPHAGOGASTRODUODENOSCOPY (EGD) WITH PROPOFOL N/A 05/12/2018   Procedure: ESOPHAGOGASTRODUODENOSCOPY (EGD) WITH PROPOFOL;  Surgeon: Lollie Sails, MD;  Location: [facility] ENDOSCOPY;  Service: Endoscopy;  Laterality: N/A;  . EYE SURGERY Bilateral    cataract extractions  . FRACTURE SURGERY  2005   elbow repair  . TONSILLECTOMY  1979  . TUBAL LIGATION  1970's  . UPPER GI ENDOSCOPY      FAMILY HISTORY :   Family History  Problem Relation Age of Onset  . Colon cancer Mother        colon  . Diabetes Mother   . Arthritis Mother   .  Glaucoma Mother   . Stomach cancer Father        stomach  . Throat cancer Brother        throat  . Breast cancer Sister        breast  . Diabetes Sister   . Cirrhosis Sister   . Cancer Paternal Aunt   . Parkinson's disease Sister   . Heart attack Sister   . Ovarian cancer Sister     SOCIAL HISTORY:   Social History   Tobacco Use  . Smoking status: Former Smoker    Packs/day: 1.00    Years: 55.00    Pack years: 55.00    Types: Cigarettes    Last attempt to quit: 11/02/2017    Years since quitting: 0.6  . Smokeless tobacco: Never Used  . Tobacco comment: used to use E-cig  Substance Use Topics  . Alcohol use: Yes    Alcohol/week: 0.0 standard drinks    Comment: occasionally- wine  . Drug use: No    ALLERGIES:  is allergic to amoxicillin.  MEDICATIONS:  Current Outpatient Medications  Medication Sig Dispense Refill  . albuterol (PROAIR HFA) 108 (90 Base) MCG/ACT inhaler  Inhale 2 puffs into the lungs every 4 (four) hours as needed for wheezing or shortness of breath. 1 each 2  . buPROPion (WELLBUTRIN XL) 150 MG 24 hr tablet Take 1 tablet (150 mg total) by mouth daily. 90 tablet 1  . busPIRone (BUSPAR) 30 MG tablet TAKE 1 TABLET BY MOUTH TWICE DAILY (Patient taking differently: Take 30 mg by mouth 2 (two) times daily. ) 60 tablet 11  . CALCIUM CARBONATE-VIT D-MIN PO Take 1 tablet by mouth 2 (two) times daily.     . clotrimazole-betamethasone (LOTRISONE) cream 1 (one) Cream Cream Apply twice daily to affected area.  Not for Internal use. (Patient taking differently: Apply 1 application topically daily as needed (rash). 1 (one) Cream Cream Apply twice daily to affected area.  Not for Internal use.) 45 g 1  . escitalopram (LEXAPRO) 20 MG tablet TAKE 1 TABLET BY MOUTH ONCE DAILY (Patient taking differently: Take 20 mg by mouth daily. ) 90 tablet 1  . Fluticasone-Umeclidin-Vilant (TRELEGY ELLIPTA) 100-62.5-25 MCG/INH AEPB Inhale 1 puff into the lungs daily. 180 each 3  . Magnesium 250 MG TABS Take 250 mg by mouth daily.    . Omega-3 Fatty Acids (FISH OIL PO) Take 800 mg by mouth 2 (two) times daily.     Marland Kitchen omeprazole (PRILOSEC) 40 MG capsule Take 1 capsule (40 mg total) by mouth daily. 90 capsule 1  . polyethylene glycol powder (GLYCOLAX/MIRALAX) powder TAKE 17 GRAMS OF POWDER MIXED IN 8 OUNCES OF WATER BY MOUTH ONCE A DAY. (Patient taking differently: Take 1 Container by mouth daily. TAKE 17 GRAMS OF POWDER MIXED IN 8 OUNCES OF WATER BY MOUTH ONCE A DAY.) 255 g 3  . pravastatin (PRAVACHOL) 20 MG tablet TAKE 1 TABLET BY MOUTH AT BEDTIME (Patient taking differently: Take 20 mg by mouth at bedtime. ) 90 tablet 1  . Respiratory Therapy Supplies (FLUTTER) DEVI 1 each by Does not apply route QID. 1 each 0  . rOPINIRole (REQUIP) 3 MG tablet TAKE 1 TABLET BY MOUTH AT BEDTIME (Patient taking differently: Take 3 mg by mouth at bedtime. ) 90 tablet 1  . lidocaine-prilocaine (EMLA)  cream Apply 1 application topically as needed. 30 g 0  . ondansetron (ZOFRAN) 8 MG tablet Take 1 tablet (8 mg total) by mouth 3 (three) times daily  as needed for nausea or vomiting. 30 tablet 2  . prochlorperazine (COMPAZINE) 10 MG tablet Take 1 tablet (10 mg total) by mouth every 6 (six) hours as needed for nausea or vomiting. 30 tablet 2   No current facility-administered medications for this visit.     PHYSICAL EXAMINATION: ECOG PERFORMANCE STATUS: 1 - Symptomatic but completely ambulatory  BP (!) 152/89 (BP Location: Left Arm, Patient Position: Sitting, Cuff Size: Normal)   Pulse 96   Temp (!) 97.2 F (36.2 C) (Tympanic)   Resp 16   Wt 214 lb 3.2 oz (97.2 kg)   BMI 30.73 kg/m   Filed Weights   06/10/18 1012  Weight: 214 lb 3.2 oz (97.2 kg)   Physical Exam  Constitutional: She is oriented to person, place, and time and well-developed, well-nourished, and in no distress.  Accompanied by 1 daughter.  Not on oxygen.  She is walking herself.  HENT:  Head: Normocephalic and atraumatic.  Mouth/Throat: Oropharynx is clear and moist. No oropharyngeal exudate.  Eyes: Pupils are equal, round, and reactive to light.  Neck: Normal range of motion. Neck supple.  Cardiovascular: Normal rate and regular rhythm.  Pulmonary/Chest: No respiratory distress. She has no wheezes.  Decreased air entry bilaterally.  Abdominal: Soft. Bowel sounds are normal. She exhibits no distension and no mass. There is no abdominal tenderness. There is no rebound and no guarding.  Musculoskeletal: Normal range of motion.        General: No tenderness or edema.  Neurological: She is alert and oriented to person, place, and time.  Skin: Skin is warm.  Psychiatric: Affect normal.      LABORATORY DATA:  I have reviewed the data as listed    Component Value Date/Time   NA 140 05/01/2018 1116   K 4.9 05/01/2018 1116   CL 102 05/01/2018 1116   CO2 25 05/01/2018 1116   GLUCOSE 89 05/01/2018 1116   GLUCOSE  122 (H) 10/24/2017 1321   BUN 15 05/01/2018 1116   CREATININE 0.89 05/01/2018 1116   CALCIUM 9.6 05/01/2018 1116   PROT 6.7 05/01/2018 1116   ALBUMIN 4.2 05/01/2018 1116   AST 18 05/01/2018 1116   ALT 14 05/01/2018 1116   ALKPHOS 73 05/01/2018 1116   BILITOT 0.3 05/01/2018 1116   GFRNONAA 65 05/01/2018 1116   GFRAA 75 05/01/2018 1116    No results found for: SPEP, UPEP  Lab Results  Component Value Date   WBC 7.1 05/01/2018   NEUTROABS 4.8 05/01/2018   HGB 13.6 05/01/2018   HCT 39.8 05/01/2018   MCV 91 05/01/2018   PLT 290 05/01/2018      Chemistry      Component Value Date/Time   NA 140 05/01/2018 1116   K 4.9 05/01/2018 1116   CL 102 05/01/2018 1116   CO2 25 05/01/2018 1116   BUN 15 05/01/2018 1116   CREATININE 0.89 05/01/2018 1116      Component Value Date/Time   CALCIUM 9.6 05/01/2018 1116   ALKPHOS 73 05/01/2018 1116   AST 18 05/01/2018 1116   ALT 14 05/01/2018 1116   BILITOT 0.3 05/01/2018 1116       RADIOGRAPHIC STUDIES: I have personally reviewed the radiological images as listed and agreed with the findings in the report. No results found.   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  Not surgically resectable.  #Recommend chemoradiation therapy; carbo etoposide every  3 weeks; concurrent with radiation.  Recommend 4 cycles of carbo etoposide.  Discussed goal of treatment is cure.  Would recommend continued surveillance imaging after treatments.  Prescribe antiemetics.  #Long discussion regarding the potential chemotherapy side effects; including but not limited to nausea vomiting diarrhea hair loss neuropathy etc.  Squamous cell carcinoma of the right lower lobe lung stage I [ cT1aN0]; S/p definitive RT [Dec 2016]  #Discussed regarding the port placement patient interested.  Recommend chemotherapeutic education.  Patient referred to Dr.  Donella Stade.  We will also discussed at tumor conference.  # 40 minutes face-to-face with the patient discussing the above plan of care; more than 50% of time spent on prognosis/ natural history; counseling and coordination.  # DISPOSITION: # chemo ed/ port placement/referal to Dr.Crystal # follow up in 2 weeks/MD- labs- cbc/cmp; Carbo-Etop days-1; Etop 2&3-Dr.B   Orders Placed This Encounter  Procedures  . CBC with Differential    Standing Status:   Future    Standing Expiration Date:   06/10/2019  . Comprehensive metabolic panel    Standing Status:   Future    Standing Expiration Date:   06/10/2019  . Ambulatory referral to Radiation Oncology    Referral Priority:   Routine    Referral Type:   Consultation    Referral Reason:   Specialty Services Required    Requested Specialty:   Radiation Oncology    Number of Visits Requested:   1  . Ambulatory referral to Vascular Surgery    Referral Priority:   Routine    Referral Type:   Surgical    Referral Reason:   Specialty Services Required    Requested Specialty:   Vascular Surgery    Number of Visits Requested:   1   All questions were answered. The patient knows to call the clinic with any problems, questions or concerns.      Cammie Sickle, MD 06/10/2018 12:51 PM

## 2018-06-10 NOTE — Assessment & Plan Note (Addendum)
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  Not surgically resectable.  #Recommend chemoradiation therapy; carbo etoposide every 3 weeks; concurrent with radiation.  Recommend 4 cycles of carbo etoposide.  Discussed goal of treatment is cure.  Would recommend continued surveillance imaging after treatments.  Prescribe antiemetics.  #Long discussion regarding the potential chemotherapy side effects; including but not limited to nausea vomiting diarrhea hair loss neuropathy etc.  Squamous cell carcinoma of the right lower lobe lung stage I [ cT1aN0]; S/p definitive RT [Dec 2016]  #Discussed regarding the port placement patient interested.  Recommend chemotherapeutic education.  Patient referred to Dr. Donella Stade.  We will also discussed at tumor conference.  # 40 minutes face-to-face with the patient discussing the above plan of care; more than 50% of time spent on prognosis/ natural history; counseling and coordination.  # DISPOSITION: # chemo ed/ port placement/referal to Dr.Crystal # follow up in 2 weeks/MD- labs- cbc/cmp; Carbo-Etop days-1; Etop 2&3-Dr.B

## 2018-06-10 NOTE — Progress Notes (Signed)
  Oncology Nurse Navigator Documentation  Navigator Location: [facility] (06/10/18 1400)   )Navigator Encounter Type: Diagnostic Results;Follow-up Appt (06/10/18 1400)   Abnormal Finding Date: 04/27/18 (06/10/18 1400) Confirmed Diagnosis Date: 06/09/18 (06/10/18 1400)               Patient Visit Type: MedOnc (06/10/18 1400) Treatment Phase: Pre-Tx/Tx Discussion (06/10/18 1400) Barriers/Navigation Needs: Education;Coordination of Care (06/10/18 1400) Education: Understanding Cancer/ Treatment Options;Newly Diagnosed Cancer Education (06/10/18 1400) Interventions: Coordination of Care;Education (06/10/18 1400)   Coordination of Care: Appts;Chemo (06/10/18 1400) Education Method: Verbal;Written (06/10/18 1400)       met with patient during follow up visit with Dr. Rogue Bussing to review biopsy results and discuss treatment options. All questions answered during visit. Pt given resources regarding diagnosis as well as supportive services available. Reviewed upcoming appts with patient. Contact info given and instructed to call with any further questions or needs. Pt verbalized understanding.         Time Spent with Patient: 60 (06/10/18 1400)

## 2018-06-10 NOTE — Assessment & Plan Note (Deleted)
#      Squamous cell carcinoma of the right lower lobe lung stage I [ cT1aN0]; S/p definitive RT [Dec 2016]; CT scan 2019 stable right perihilar radiation changes; however contralateral hilar adenopathy noted [see discussion].  Jan 2020 PET scan-suspicious for left hilar uptake/recurrence.   #Left leg swelling-recent Dopplers negative for DVT.  # DISPOSITION: # chemo ed/ port placement/referal to Dr.Crystal # follow up in 2 weeks/MD- labs- cbc/cmp; Carbo-Etop days-1; Etop 2&3-Dr.B

## 2018-06-11 ENCOUNTER — Telehealth (INDEPENDENT_AMBULATORY_CARE_PROVIDER_SITE_OTHER): Payer: Self-pay

## 2018-06-11 NOTE — Telephone Encounter (Signed)
Patient returned my call and is now scheduled with Dr. Delana Meyer on 06/17/2018 with a 12:45pm arrival time. Pre-procedure instructions were gone over and patient understood.

## 2018-06-11 NOTE — Addendum Note (Signed)
Addendum  created 06/11/18 1259 by Doreen Salvage, CRNA   Charge Capture section accepted

## 2018-06-11 NOTE — Telephone Encounter (Signed)
Attempted to contact the patient and left a message for a return call.

## 2018-06-11 NOTE — Patient Instructions (Signed)
Etoposide, VP-16 injection What is this medicine? ETOPOSIDE, VP-16 (e toe POE side) is a chemotherapy drug. It is used to treat testicular cancer, lung cancer, and other cancers. This medicine may be used for other purposes; ask your health care provider or pharmacist if you have questions. COMMON BRAND NAME(S): Etopophos, Toposar, VePesid What should I tell my health care provider before I take this medicine? They need to know if you have any of these conditions: -infection -kidney disease -liver disease -low blood counts, like low white cell, platelet, or red cell counts -an unusual or allergic reaction to etoposide, other medicines, foods, dyes, or preservatives -pregnant or trying to get pregnant -breast-feeding How should I use this medicine? This medicine is for infusion into a vein. It is administered in a hospital or clinic by a specially trained health care professional. Talk to your pediatrician regarding the use of this medicine in children. Special care may be needed. Overdosage: If you think you have taken too much of this medicine contact a poison control center or emergency room at once. NOTE: This medicine is only for you. Do not share this medicine with others. What if I miss a dose? It is important not to miss your dose. Call your doctor or health care professional if you are unable to keep an appointment. What may interact with this medicine? -aspirin -certain medications for seizures like carbamazepine, phenobarbital, phenytoin, valproic acid -cyclosporine -levamisole -warfarin This list may not describe all possible interactions. Give your health care provider a list of all the medicines, herbs, non-prescription drugs, or dietary supplements you use. Also tell them if you smoke, drink alcohol, or use illegal drugs. Some items may interact with your medicine. What should I watch for while using this medicine? Visit your doctor for checks on your progress. This drug  may make you feel generally unwell. This is not uncommon, as chemotherapy can affect healthy cells as well as cancer cells. Report any side effects. Continue your course of treatment even though you feel ill unless your doctor tells you to stop. In some cases, you may be given additional medicines to help with side effects. Follow all directions for their use. Call your doctor or health care professional for advice if you get a fever, chills or sore throat, or other symptoms of a cold or flu. Do not treat yourself. This drug decreases your body's ability to fight infections. Try to avoid being around people who are sick. This medicine may increase your risk to bruise or bleed. Call your doctor or health care professional if you notice any unusual bleeding. Talk to your doctor about your risk of cancer. You may be more at risk for certain types of cancers if you take this medicine. Do not become pregnant while taking this medicine or for at least 6 months after stopping it. Women should inform their doctor if they wish to become pregnant or think they might be pregnant. Women of child-bearing potential will need to have a negative pregnancy test before starting this medicine. There is a potential for serious side effects to an unborn child. Talk to your health care professional or pharmacist for more information. Do not breast-feed an infant while taking this medicine. Men must use a latex condom during sexual contact with a woman while taking this medicine and for at least 4 months after stopping it. A latex condom is needed even if you have had a vasectomy. Contact your doctor right away if your partner becomes pregnant. Do   not donate sperm while taking this medicine and for at least 4 months after you stop taking this medicine. Men should inform their doctors if they wish to father a child. This medicine may lower sperm counts. What side effects may I notice from receiving this medicine? Side effects that  you should report to your doctor or health care professional as soon as possible: -allergic reactions like skin rash, itching or hives, swelling of the face, lips, or tongue -low blood counts - this medicine may decrease the number of white blood cells, red blood cells and platelets. You may be at increased risk for infections and bleeding. -signs of infection - fever or chills, cough, sore throat, pain or difficulty passing urine -signs of decreased platelets or bleeding - bruising, pinpoint red spots on the skin, black, tarry stools, blood in the urine -signs of decreased red blood cells - unusually weak or tired, fainting spells, lightheadedness -breathing problems -changes in vision -mouth or throat sores or ulcers -pain, redness, swelling or irritation at the injection site -pain, tingling, numbness in the hands or feet -redness, blistering, peeling or loosening of the skin, including inside the mouth -seizures -vomiting Side effects that usually do not require medical attention (report to your doctor or health care professional if they continue or are bothersome): -diarrhea -hair loss -loss of appetite -nausea -stomach pain This list may not describe all possible side effects. Call your doctor for medical advice about side effects. You may report side effects to FDA at 1-800-FDA-1088. Where should I keep my medicine? This drug is given in a hospital or clinic and will not be stored at home. NOTE: This sheet is a summary. It may not cover all possible information. If you have questions about this medicine, talk to your doctor, pharmacist, or health care provider.  2019 Elsevier/Gold Standard (2015-04-14 11:53:23) Carboplatin injection What is this medicine? CARBOPLATIN (KAR boe pla tin) is a chemotherapy drug. It targets fast dividing cells, like cancer cells, and causes these cells to die. This medicine is used to treat ovarian cancer and many other cancers. This medicine may be  used for other purposes; ask your health care provider or pharmacist if you have questions. COMMON BRAND NAME(S): Paraplatin What should I tell my health care provider before I take this medicine? They need to know if you have any of these conditions: -blood disorders -hearing problems -kidney disease -recent or ongoing radiation therapy -an unusual or allergic reaction to carboplatin, cisplatin, other chemotherapy, other medicines, foods, dyes, or preservatives -pregnant or trying to get pregnant -breast-feeding How should I use this medicine? This drug is usually given as an infusion into a vein. It is administered in a hospital or clinic by a specially trained health care professional. Talk to your pediatrician regarding the use of this medicine in children. Special care may be needed. Overdosage: If you think you have taken too much of this medicine contact a poison control center or emergency room at once. NOTE: This medicine is only for you. Do not share this medicine with others. What if I miss a dose? It is important not to miss a dose. Call your doctor or health care professional if you are unable to keep an appointment. What may interact with this medicine? -medicines for seizures -medicines to increase blood counts like filgrastim, pegfilgrastim, sargramostim -some antibiotics like amikacin, gentamicin, neomycin, streptomycin, tobramycin -vaccines Talk to your doctor or health care professional before taking any of these medicines: -acetaminophen -aspirin -ibuprofen -ketoprofen -naproxen  This list may not describe all possible interactions. Give your health care provider a list of all the medicines, herbs, non-prescription drugs, or dietary supplements you use. Also tell them if you smoke, drink alcohol, or use illegal drugs. Some items may interact with your medicine. What should I watch for while using this medicine? Your condition will be monitored carefully while you are  receiving this medicine. You will need important blood work done while you are taking this medicine. This drug may make you feel generally unwell. This is not uncommon, as chemotherapy can affect healthy cells as well as cancer cells. Report any side effects. Continue your course of treatment even though you feel ill unless your doctor tells you to stop. In some cases, you may be given additional medicines to help with side effects. Follow all directions for their use. Call your doctor or health care professional for advice if you get a fever, chills or sore throat, or other symptoms of a cold or flu. Do not treat yourself. This drug decreases your body's ability to fight infections. Try to avoid being around people who are sick. This medicine may increase your risk to bruise or bleed. Call your doctor or health care professional if you notice any unusual bleeding. Be careful brushing and flossing your teeth or using a toothpick because you may get an infection or bleed more easily. If you have any dental work done, tell your dentist you are receiving this medicine. Avoid taking products that contain aspirin, acetaminophen, ibuprofen, naproxen, or ketoprofen unless instructed by your doctor. These medicines may hide a fever. Do not become pregnant while taking this medicine. Women should inform their doctor if they wish to become pregnant or think they might be pregnant. There is a potential for serious side effects to an unborn child. Talk to your health care professional or pharmacist for more information. Do not breast-feed an infant while taking this medicine. What side effects may I notice from receiving this medicine? Side effects that you should report to your doctor or health care professional as soon as possible: -allergic reactions like skin rash, itching or hives, swelling of the face, lips, or tongue -signs of infection - fever or chills, cough, sore throat, pain or difficulty passing  urine -signs of decreased platelets or bleeding - bruising, pinpoint red spots on the skin, black, tarry stools, nosebleeds -signs of decreased red blood cells - unusually weak or tired, fainting spells, lightheadedness -breathing problems -changes in hearing -changes in vision -chest pain -high blood pressure -low blood counts - This drug may decrease the number of white blood cells, red blood cells and platelets. You may be at increased risk for infections and bleeding. -nausea and vomiting -pain, swelling, redness or irritation at the injection site -pain, tingling, numbness in the hands or feet -problems with balance, talking, walking -trouble passing urine or change in the amount of urine Side effects that usually do not require medical attention (report to your doctor or health care professional if they continue or are bothersome): -hair loss -loss of appetite -metallic taste in the mouth or changes in taste This list may not describe all possible side effects. Call your doctor for medical advice about side effects. You may report side effects to FDA at 1-800-FDA-1088. Where should I keep my medicine? This drug is given in a hospital or clinic and will not be stored at home. NOTE: This sheet is a summary. It may not cover all possible information. If you have  questions about this medicine, talk to your doctor, pharmacist, or health care provider.  2019 Elsevier/Gold Standard (2007-07-28 14:38:05)

## 2018-06-15 ENCOUNTER — Inpatient Hospital Stay: Payer: Medicare Other

## 2018-06-16 ENCOUNTER — Other Ambulatory Visit (INDEPENDENT_AMBULATORY_CARE_PROVIDER_SITE_OTHER): Payer: Self-pay | Admitting: Nurse Practitioner

## 2018-06-16 ENCOUNTER — Other Ambulatory Visit: Payer: Self-pay | Admitting: Internal Medicine

## 2018-06-16 MED ORDER — CLINDAMYCIN PHOSPHATE 300 MG/50ML IV SOLN
300.0000 mg | Freq: Once | INTRAVENOUS | Status: AC
  Administered 2018-06-17: 300 mg via INTRAVENOUS

## 2018-06-16 NOTE — Progress Notes (Signed)
Will discuss at tumor conference on 2/13-

## 2018-06-17 ENCOUNTER — Other Ambulatory Visit: Payer: Self-pay

## 2018-06-17 ENCOUNTER — Telehealth (INDEPENDENT_AMBULATORY_CARE_PROVIDER_SITE_OTHER): Payer: Self-pay

## 2018-06-17 ENCOUNTER — Ambulatory Visit
Admission: RE | Admit: 2018-06-17 | Discharge: 2018-06-17 | Disposition: A | Discharge status: Home / Self Care | Payer: Medicare Other | Attending: Vascular Surgery | Admitting: Vascular Surgery

## 2018-06-17 ENCOUNTER — Encounter
Admission: RE | Disposition: A | Discharge status: Home / Self Care | Payer: Self-pay | Source: Home / Self Care | Attending: Vascular Surgery

## 2018-06-17 ENCOUNTER — Encounter: Payer: Self-pay | Admitting: *Deleted

## 2018-06-17 DIAGNOSIS — C3490 Malignant neoplasm of unspecified part of unspecified bronchus or lung: Secondary | ICD-10-CM | POA: Insufficient documentation

## 2018-06-17 HISTORY — PX: PORTA CATH INSERTION: CATH118285

## 2018-06-17 SURGERY — PORTA CATH INSERTION
Anesthesia: Moderate Sedation

## 2018-06-17 MED ORDER — SODIUM CHLORIDE 0.9 % IV SOLN: INTRAVENOUS | Status: DC

## 2018-06-17 MED ORDER — FENTANYL CITRATE (PF) 100 MCG/2ML IJ SOLN
INTRAMUSCULAR | Status: AC
  Filled 2018-06-17: qty 2

## 2018-06-17 MED ORDER — METHYLPREDNISOLONE SODIUM SUCC 125 MG IJ SOLR: 125.0000 mg | Freq: Once | INTRAMUSCULAR | Status: DC | PRN

## 2018-06-17 MED ORDER — ONDANSETRON HCL 4 MG/2ML IJ SOLN: 4.0000 mg | Freq: Four times a day (QID) | INTRAMUSCULAR | Status: DC | PRN

## 2018-06-17 MED ORDER — HYDROMORPHONE HCL 1 MG/ML IJ SOLN: 1.0000 mg | Freq: Once | INTRAMUSCULAR | Status: DC | PRN

## 2018-06-17 MED ORDER — FENTANYL CITRATE (PF) 100 MCG/2ML IJ SOLN
INTRAMUSCULAR | Status: DC | PRN
  Administered 2018-06-17: 50 ug via INTRAVENOUS

## 2018-06-17 MED ORDER — LIDOCAINE-EPINEPHRINE (PF) 1 %-1:200000 IJ SOLN
INTRAMUSCULAR | Status: AC
  Filled 2018-06-17: qty 30

## 2018-06-17 MED ORDER — HEPARIN (PORCINE) IN NACL 1000-0.9 UT/500ML-% IV SOLN
INTRAVENOUS | Status: AC
  Filled 2018-06-17: qty 1000

## 2018-06-17 MED ORDER — MIDAZOLAM HCL 2 MG/2ML IJ SOLN
INTRAMUSCULAR | Status: AC
  Filled 2018-06-17: qty 2

## 2018-06-17 MED ORDER — MIDAZOLAM HCL 2 MG/2ML IJ SOLN
INTRAMUSCULAR | Status: DC | PRN
  Administered 2018-06-17: 2 mg via INTRAVENOUS

## 2018-06-17 MED ORDER — CLINDAMYCIN PHOSPHATE 300 MG/50ML IV SOLN
INTRAVENOUS | Status: AC
  Filled 2018-06-17: qty 50

## 2018-06-17 MED ORDER — MIDAZOLAM HCL 2 MG/ML PO SYRP: 8.0000 mg | ORAL_SOLUTION | Freq: Once | ORAL | Status: DC | PRN

## 2018-06-17 MED ORDER — DIPHENHYDRAMINE HCL 50 MG/ML IJ SOLN: 50.0000 mg | Freq: Once | INTRAMUSCULAR | Status: DC | PRN

## 2018-06-17 SURGICAL SUPPLY — 9 items
DRAPE INCISE IOBAN 66X45 STRL (DRAPES) ×3 IMPLANT
KIT PORT POWER 8FR ISP CVUE (Port) ×3 IMPLANT
NEEDLE ENTRY 21GA 7CM ECHOTIP (NEEDLE) ×3 IMPLANT
PACK ANGIOGRAPHY (CUSTOM PROCEDURE TRAY) ×3 IMPLANT
SET INTRO CAPELLA COAXIAL (SET/KITS/TRAYS/PACK) ×3 IMPLANT
SUT MNCRL AB 4-0 PS2 18 (SUTURE) ×3 IMPLANT
SUT PROLENE 0 CT 1 30 (SUTURE) ×3 IMPLANT
SUT VIC AB 3-0 SH 27 (SUTURE) ×2
SUT VIC AB 3-0 SH 27X BRD (SUTURE) ×1 IMPLANT

## 2018-06-17 NOTE — Telephone Encounter (Signed)
Spoke again with the patient and gave her the arrival time for her procedure today.

## 2018-06-17 NOTE — H&P (Signed)
[facility] History & Physical Update  The patient was interviewed and re-examined.  The patient's previous History and Physical has been reviewed and is unchanged.  There is no change in the plan of care. We plan to proceed with the scheduled procedure.  Hortencia Pilar, MD  06/17/2018, 1:13 PM

## 2018-06-17 NOTE — Op Note (Signed)
OPERATIVE NOTE   PROCEDURE: 1. Placement of a right IJ Infuse-a-Port  PRE-OPERATIVE DIAGNOSIS: Lung carcinoma  POST-OPERATIVE DIAGNOSIS: Same  SURGEON: Katha Cabal M.D.  ANESTHESIA: Conscious sedation was administered under my direct supervision by the interventional radiology RN. IV Versed plus fentanyl were utilized. Continuous ECG, pulse oximetry and blood pressure was monitored throughout the entire procedure. Conscious sedation was for a total of 32 minutes.  ESTIMATED BLOOD LOSS: Minimal   FINDING(S): 1.  Patent vein  SPECIMEN(S): None  INDICATIONS:   Cathy Watts is a 73 y.o. female who presents with lung carcinoma.  She is starting chemotherapy and therefore requires appropriate parenteral access.  Risks and benefits for port placement have been reviewed all questions answered patient has agreed to proceed.  DESCRIPTION: After obtaining full informed written consent, the patient was brought back to the special procedure suite and placed in the supine position. The patient's right neck and chest wall are prepped and draped in sterile fashion. Appropriate timeout was called.  Ultrasound is placed in a sterile sleeve, ultrasound is utilized to avoid vascular injury as well as secondary to lack of appropriate landmarks. The right internal jugular vein is identified. It is echolucent and homogeneous as well as easily compressible indicating patency. An image is recorded for the permanent record.  Access to the vein with a micropuncture needle is done under direct ultrasound visualization.  1% lidocaine is infiltrated into the soft tissue at the base of the neck as well as on the chest wall.  Under direct ultrasound visualization a micro-needle is inserted into the vein followed by the micro-wire. Micro-sheath was then advanced and a J wire is inserted without difficulty under fluoroscopic guidance. A small counterincision was created at the wire insertion site. A transverse  incision is created 2 fingerbreadths below the scapula and a pocket is fashioned using both blunt and sharp dissection. The pocket is tested for appropriate size with the hub of the Infuse-a-Port. The tunneling device is then used to pull the intravascular portion of the catheter from the pocket to the neck counterincision.  Dilator and peel-away sheath were then inserted over the wire and the wire is removed. Catheter is then advanced into the venous system without difficulty. Peel-away sheath was then removed.  Catheter is then positioned under fluoroscopic guidance at the atrial caval junction. It is then transected connected to the hub and the hope is slipped into the subcutaneous pocket on the chest wall. The hub was then accessed percutaneously and aspirates easily and flushes well and is flushed with 30 cc of heparinized saline. The pocket incision is then closed in layers using interrupted 3-0 Vicryl for the subcutaneous tissues and 4-0 Monocryl subcuticular for skin closure. Dermabond is applied. The neck counterincision was closed with 4-0 Monocryl subcuticular and Dermabond as well.  The patient tolerated the procedure well and there were no immediate complications.  COMPLICATIONS: None  CONDITION: Unchanged  Katha Cabal M.D. [facility] Office: [phone]   06/17/2018, 4:03 PM

## 2018-06-17 NOTE — Discharge Instructions (Signed)
Implanted Port Insertion, Care After °This sheet gives you information about how to care for yourself after your procedure. Your health care provider may also give you more specific instructions. If you have problems or questions, contact your health care provider. °What can I expect after the procedure? °After the procedure, it is common to have: °· Discomfort at the port insertion site. °· Bruising on the skin over the port. This should improve over 3-4 days. °Follow these instructions at home: °Port care °· After your port is placed, you will get a manufacturer's information card. The card has information about your port. Keep this card with you at all times. °· Take care of the port as told by your health care provider. Ask your health care provider if you or a family member can get training for taking care of the port at home. A home health care nurse may also take care of the port. °· Make sure to remember what type of port you have. °Incision care ° °  ° °· Follow instructions from your health care provider about how to take care of your port insertion site. Make sure you: °? Wash your hands with soap and water before and after you change your bandage (dressing). If soap and water are not available, use hand sanitizer. °? Change your dressing as told by your health care provider. °? Leave stitches (sutures), skin glue, or adhesive strips in place. These skin closures may need to stay in place for 2 weeks or longer. If adhesive strip edges start to loosen and curl up, you may trim the loose edges. Do not remove adhesive strips completely unless your health care provider tells you to do that. °· Check your port insertion site every day for signs of infection. Check for: °? Redness, swelling, or pain. °? Fluid or blood. °? Warmth. °? Pus or a bad smell. °Activity °· Return to your normal activities as told by your health care provider. Ask your health care provider what activities are safe for you. °· Do not  lift anything that is heavier than 10 lb (4.5 kg), or the limit that you are told, until your health care provider says that it is safe. °General instructions °· Take over-the-counter and prescription medicines only as told by your health care provider. °· Do not take baths, swim, or use a hot tub until your health care provider approves. Ask your health care provider if you may take showers. You may only be allowed to take sponge baths. °· Do not drive for 24 hours if you were given a sedative during your procedure. °· Wear a medical alert bracelet in case of an emergency. This will tell any health care providers that you have a port. °· Keep all follow-up visits as told by your health care provider. This is important. °Contact a health care provider if: °· You cannot flush your port with saline as directed, or you cannot draw blood from the port. °· You have a fever or chills. °· You have redness, swelling, or pain around your port insertion site. °· You have fluid or blood coming from your port insertion site. °· Your port insertion site feels warm to the touch. °· You have pus or a bad smell coming from the port insertion site. °Get help right away if: °· You have chest pain or shortness of breath. °· You have bleeding from your port that you cannot control. °Summary °· Take care of the port as told by your health   care provider. Keep the manufacturer's information card with you at all times.  Change your dressing as told by your health care provider.  Contact a health care provider if you have a fever or chills or if you have redness, swelling, or pain around your port insertion site.  Keep all follow-up visits as told by your health care provider. This information is not intended to replace advice given to you by your health care provider. Make sure you discuss any questions you have with your health care provider. Document Released: 02/10/2013 Document Revised: 11/18/2017 Document Reviewed:  11/18/2017 Elsevier Interactive Patient Education  2019 [place] Insertion, Care After This sheet gives you information about how to care for yourself after your procedure. Your health care provider may also give you more specific instructions. If you have problems or questions, contact your health care provider. What can I expect after the procedure? After the procedure, it is common to have:  Discomfort at the port insertion site.  Bruising on the skin over the port. This should improve over 3-4 days. Follow these instructions at home: [facility] care  After your port is placed, you will get a manufacturer's information card. The card has information about your port. Keep this card with you at all times.  Take care of the port as told by your health care provider. Ask your health care provider if you or a family member can get training for taking care of the port at home. A home health care nurse may also take care of the port.  Make sure to remember what type of port you have. Incision care      Follow instructions from your health care provider about how to take care of your port insertion site. Make sure you: ? Wash your hands with soap and water before and after you change your bandage (dressing). If soap and water are not available, use hand sanitizer. ? Change your dressing as told by your health care provider. ? Leave stitches (sutures), skin glue, or adhesive strips in place. These skin closures may need to stay in place for 2 weeks or longer. If adhesive strip edges start to loosen and curl up, you may trim the loose edges. Do not remove adhesive strips completely unless your health care provider tells you to do that.  Check your port insertion site every day for signs of infection. Check for: ? Redness, swelling, or pain. ? Fluid or blood. ? Warmth. ? Pus or a bad smell. Activity  Return to your normal activities as told by your health care provider. Ask  your health care provider what activities are safe for you.  Do not lift anything that is heavier than 10 lb (4.5 kg), or the limit that you are told, until your health care provider says that it is safe. General instructions  Take over-the-counter and prescription medicines only as told by your health care provider.  Do not take baths, swim, or use a hot tub until your health care provider approves. Ask your health care provider if you may take showers. You may only be allowed to take sponge baths.  Do not drive for 24 hours if you were given a sedative during your procedure.  Wear a medical alert bracelet in case of an emergency. This will tell any health care providers that you have a port.  Keep all follow-up visits as told by your health care provider. This is important. Contact a health care provider if:  You  cannot flush your port with saline as directed, or you cannot draw blood from the port.  You have a fever or chills.  You have redness, swelling, or pain around your port insertion site.  You have fluid or blood coming from your port insertion site.  Your port insertion site feels warm to the touch.  You have pus or a bad smell coming from the port insertion site. Get help right away if:  You have chest pain or shortness of breath.  You have bleeding from your port that you cannot control. Summary  Take care of the port as told by your health care provider. Keep the manufacturer's information card with you at all times.  Change your dressing as told by your health care provider.  Contact a health care provider if you have a fever or chills or if you have redness, swelling, or pain around your port insertion site.  Keep all follow-up visits as told by your health care provider. This information is not intended to replace advice given to you by your health care provider. Make sure you discuss any questions you have with your health care provider. Document Released:  02/10/2013 Document Revised: 11/18/2017 Document Reviewed: 11/18/2017 Elsevier Interactive Patient Education  [employer].

## 2018-06-18 ENCOUNTER — Other Ambulatory Visit: Payer: Medicare Other

## 2018-06-18 ENCOUNTER — Encounter: Payer: Self-pay | Admitting: Vascular Surgery

## 2018-06-18 NOTE — Progress Notes (Signed)
Tumor Board Documentation  Cathy Watts was presented by Dr Rogue Bussing at our Tumor Board on 06/18/2018, which included representatives from surgical, medical oncology, radiology, pathology, radiation oncology, surgical oncology, navigation, internal medicine, research.  Cathy Watts currently presents as a current patient, for discussion with history of the following treatments: surgical intervention(s).  Additionally, we reviewed previous medical and familial history, history of present illness, and recent lab results along with all available histopathologic and imaging studies. The tumor board considered available treatment options and made the following recommendations: Concurrent chemo-radiation therapy    The following procedures/referrals were also placed: No orders of the defined types were placed in this encounter.   Clinical Trial Status: not discussed   Staging used: AJCC Stage Group AJCC Staging: T: Tx N: N2   Group: Stage 3 Lung Cancer  National site-specific guidelines NCCN were discussed with respect to the case.  Tumor board is a meeting of clinicians from various specialty areas who evaluate and discuss patients for whom a multidisciplinary approach is being considered. Final determinations in the plan of care are those of the provider(s). The responsibility for follow up of recommendations given during tumor board is that of the provider.   Today's extended care, comprehensive team conference, Cathy Watts was not present for the discussion and was not examined.   Multidisciplinary Tumor Board is a multidisciplinary case peer review process.  Decisions discussed in the Multidisciplinary Tumor Board reflect the opinions of the specialists present at the conference without having examined the patient.  Ultimately, treatment and diagnostic decisions rest with the primary provider(s) and the patient.

## 2018-06-23 ENCOUNTER — Inpatient Hospital Stay: Payer: Medicare Other

## 2018-06-23 ENCOUNTER — Encounter: Payer: Self-pay | Admitting: *Deleted

## 2018-06-23 ENCOUNTER — Encounter: Payer: Self-pay | Admitting: Internal Medicine

## 2018-06-23 ENCOUNTER — Ambulatory Visit
Admission: RE | Admit: 2018-06-23 | Discharge: 2018-06-23 | Disposition: A | Discharge status: Home / Self Care | Payer: Medicare Other | Source: Ambulatory Visit | Attending: Radiation Oncology | Admitting: Radiation Oncology

## 2018-06-23 ENCOUNTER — Inpatient Hospital Stay (HOSPITAL_BASED_OUTPATIENT_CLINIC_OR_DEPARTMENT_OTHER): Payer: Medicare Other | Admitting: Hospice and Palliative Medicine

## 2018-06-23 ENCOUNTER — Inpatient Hospital Stay (HOSPITAL_BASED_OUTPATIENT_CLINIC_OR_DEPARTMENT_OTHER): Payer: Medicare Other | Admitting: Internal Medicine

## 2018-06-23 ENCOUNTER — Inpatient Hospital Stay: Payer: Medicare Other | Attending: Internal Medicine

## 2018-06-23 VITALS — BP 150/94 | HR 83 | Resp 16 | Wt 218.0 lb

## 2018-06-23 DIAGNOSIS — Z79899 Other long term (current) drug therapy: Secondary | ICD-10-CM

## 2018-06-23 DIAGNOSIS — K219 Gastro-esophageal reflux disease without esophagitis: Secondary | ICD-10-CM | POA: Insufficient documentation

## 2018-06-23 DIAGNOSIS — Z8711 Personal history of peptic ulcer disease: Secondary | ICD-10-CM | POA: Insufficient documentation

## 2018-06-23 DIAGNOSIS — Z800 Family history of malignant neoplasm of digestive organs: Secondary | ICD-10-CM

## 2018-06-23 DIAGNOSIS — C3402 Malignant neoplasm of left main bronchus: Secondary | ICD-10-CM

## 2018-06-23 DIAGNOSIS — Z5111 Encounter for antineoplastic chemotherapy: Secondary | ICD-10-CM | POA: Diagnosis not present

## 2018-06-23 DIAGNOSIS — Z87891 Personal history of nicotine dependence: Secondary | ICD-10-CM | POA: Insufficient documentation

## 2018-06-23 DIAGNOSIS — G4730 Sleep apnea, unspecified: Secondary | ICD-10-CM | POA: Insufficient documentation

## 2018-06-23 DIAGNOSIS — Z803 Family history of malignant neoplasm of breast: Secondary | ICD-10-CM

## 2018-06-23 DIAGNOSIS — E7800 Pure hypercholesterolemia, unspecified: Secondary | ICD-10-CM | POA: Diagnosis not present

## 2018-06-23 DIAGNOSIS — R0602 Shortness of breath: Secondary | ICD-10-CM | POA: Insufficient documentation

## 2018-06-23 DIAGNOSIS — I714 Abdominal aortic aneurysm, without rupture: Secondary | ICD-10-CM

## 2018-06-23 DIAGNOSIS — Z923 Personal history of irradiation: Secondary | ICD-10-CM | POA: Insufficient documentation

## 2018-06-23 DIAGNOSIS — F329 Major depressive disorder, single episode, unspecified: Secondary | ICD-10-CM | POA: Diagnosis not present

## 2018-06-23 DIAGNOSIS — M8580 Other specified disorders of bone density and structure, unspecified site: Secondary | ICD-10-CM | POA: Diagnosis not present

## 2018-06-23 DIAGNOSIS — J449 Chronic obstructive pulmonary disease, unspecified: Secondary | ICD-10-CM | POA: Diagnosis not present

## 2018-06-23 DIAGNOSIS — Z8041 Family history of malignant neoplasm of ovary: Secondary | ICD-10-CM

## 2018-06-23 DIAGNOSIS — Z801 Family history of malignant neoplasm of trachea, bronchus and lung: Secondary | ICD-10-CM

## 2018-06-23 DIAGNOSIS — C3431 Malignant neoplasm of lower lobe, right bronchus or lung: Secondary | ICD-10-CM | POA: Diagnosis present

## 2018-06-23 DIAGNOSIS — Z9981 Dependence on supplemental oxygen: Secondary | ICD-10-CM

## 2018-06-23 LAB — CBC WITH DIFFERENTIAL/PLATELET
Abs Immature Granulocytes: 0.03 10*3/uL (ref 0.00–0.07)
Basophils Absolute: 0.1 10*3/uL (ref 0.0–0.1)
Basophils Relative: 1 %
EOS PCT: 5 %
Eosinophils Absolute: 0.4 10*3/uL (ref 0.0–0.5)
HCT: 39.9 % (ref 36.0–46.0)
Hemoglobin: 12.9 g/dL (ref 12.0–15.0)
Immature Granulocytes: 0 %
Lymphocytes Relative: 18 %
Lymphs Abs: 1.3 10*3/uL (ref 0.7–4.0)
MCH: 30 pg (ref 26.0–34.0)
MCHC: 32.3 g/dL (ref 30.0–36.0)
MCV: 92.8 fL (ref 80.0–100.0)
MONO ABS: 1 10*3/uL (ref 0.1–1.0)
MONOS PCT: 13 %
Neutro Abs: 4.7 10*3/uL (ref 1.7–7.7)
Neutrophils Relative %: 63 %
Platelets: 277 10*3/uL (ref 150–400)
RBC: 4.3 MIL/uL (ref 3.87–5.11)
RDW: 12.8 % (ref 11.5–15.5)
WBC: 7.4 10*3/uL (ref 4.0–10.5)
nRBC: 0 % (ref 0.0–0.2)

## 2018-06-23 LAB — COMPREHENSIVE METABOLIC PANEL
ALT: 14 U/L (ref 0–44)
AST: 17 U/L (ref 15–41)
Albumin: 3.5 g/dL (ref 3.5–5.0)
Alkaline Phosphatase: 77 U/L (ref 38–126)
Anion gap: 8 (ref 5–15)
BUN: 18 mg/dL (ref 8–23)
CO2: 26 mmol/L (ref 22–32)
Calcium: 9 mg/dL (ref 8.9–10.3)
Chloride: 104 mmol/L (ref 98–111)
Creatinine, Ser: 0.71 mg/dL (ref 0.44–1.00)
GFR calc Af Amer: >60 mL/min (ref >60)
Glucose, Bld: 102 mg/dL — ABNORMAL HIGH (ref 70–99)
Potassium: 4.2 mmol/L (ref 3.5–5.1)
Sodium: 138 mmol/L (ref 135–145)
Total Bilirubin: 0.4 mg/dL (ref 0.3–1.2)
Total Protein: 6.8 g/dL (ref 6.5–8.1)

## 2018-06-23 MED ORDER — SODIUM CHLORIDE 0.9 % IV SOLN
100.0000 mg/m2 | Freq: Once | INTRAVENOUS | Status: AC
  Administered 2018-06-23: 220 mg via INTRAVENOUS
  Filled 2018-06-23: qty 11

## 2018-06-23 MED ORDER — SODIUM CHLORIDE 0.9% FLUSH
10.0000 mL | INTRAVENOUS | Status: DC | PRN
  Administered 2018-06-23: 10 mL
  Filled 2018-06-23: qty 10

## 2018-06-23 MED ORDER — PALONOSETRON HCL INJECTION 0.25 MG/5ML
0.2500 mg | Freq: Once | INTRAVENOUS | Status: AC
  Administered 2018-06-23: 0.25 mg via INTRAVENOUS
  Filled 2018-06-23: qty 5

## 2018-06-23 MED ORDER — HEPARIN SOD (PORK) LOCK FLUSH 100 UNIT/ML IV SOLN
500.0000 [IU] | Freq: Once | INTRAVENOUS | Status: AC | PRN
  Administered 2018-06-23: 500 [IU]

## 2018-06-23 MED ORDER — SODIUM CHLORIDE 0.9 % IV SOLN
410.0000 mg | Freq: Once | INTRAVENOUS | Status: AC
  Administered 2018-06-23: 410 mg via INTRAVENOUS
  Filled 2018-06-23: qty 41

## 2018-06-23 MED ORDER — SODIUM CHLORIDE 0.9 % IV SOLN
Freq: Once | INTRAVENOUS | Status: AC
  Administered 2018-06-23: 11:00:00 via INTRAVENOUS
  Filled 2018-06-23: qty 250

## 2018-06-23 MED ORDER — DEXAMETHASONE SODIUM PHOSPHATE 10 MG/ML IJ SOLN
10.0000 mg | Freq: Once | INTRAMUSCULAR | Status: AC
  Administered 2018-06-23: 10 mg via INTRAVENOUS
  Filled 2018-06-23: qty 1

## 2018-06-23 MED ORDER — SODIUM CHLORIDE 0.9 % IV SOLN: 10.0000 mg | Freq: Once | INTRAVENOUS | Status: DC

## 2018-06-23 NOTE — Progress Notes (Signed)
  Oncology Nurse Navigator Documentation  Navigator Location: [facility] (06/23/18 1300)   )Navigator Encounter Type: Treatment (06/23/18 1300)                   Treatment Initiated Date: 06/23/18 (06/23/18 1300) Patient Visit Type: MedOnc (06/23/18 1300) Treatment Phase: First Chemo Tx (06/23/18 1300) Barriers/Navigation Needs: No barriers at this time (06/23/18 1300)   Interventions: None required (06/23/18 1300)                      Time Spent with Patient: 15 (06/23/18 1300)

## 2018-06-23 NOTE — Progress Notes (Signed)
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 NOV 15th CT NED  # DEC-JAN 2020-CT/PET-Left hilar/mediastinal shift large cell neuroendocrine; Limited stage.TxN2-Stage III  # 18th Feb 2020- carbo-Etop with RT  # COPD on 02 at night; hx of smoking.  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      Cancer of lower lobe of right lung 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 NOV 15th CT NED  # DEC-JAN 2020-CT/PET-Left hilar/mediastinal shift large cell neuroendocrine; Limited stage.TxN2-Stage III  # 18th Feb 2020- carbo-Etop with RT  # COPD on 02 at night; hx of smoking.  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      Cancer of lower lobe of right lung [facility])     INTERVAL HISTORY:  Cathy Watts 73 y.o.  female pleasant patient above history of newly diagnosed left hilar large cell neuroendocrine-/limited stage-is here for follow-up-and  proceed with chemotherapy.  Patient is awaiting appointment with radiation oncology this afternoon.  In the interim patient had a port placement.   Patient continues with chronic shortness of breath chronic cough.  Not any worse.  No hemoptysis.  No headaches.  Review of Systems  Constitutional: Positive for malaise/fatigue. Negative for chills, diaphoresis, fever and weight loss.  HENT: Negative for nosebleeds and sore throat.   Eyes: Negative for double vision.  Respiratory: Positive for cough and shortness of breath. Negative for  hemoptysis, sputum production and wheezing.   Cardiovascular: Negative for chest pain, palpitations, orthopnea and leg swelling.  Gastrointestinal: Negative for abdominal pain, blood in stool, constipation, diarrhea, heartburn, melena, nausea and vomiting.  Genitourinary: Negative for dysuria, frequency and urgency.  Musculoskeletal: Negative for back pain and joint pain.  Skin: Negative.  Negative for itching and rash.  Neurological: Negative for dizziness, tingling, focal weakness, weakness and headaches.  Endo/Heme/Allergies: Does not bruise/bleed easily.  Psychiatric/Behavioral: Negative for depression. The patient is not nervous/anxious and does not have insomnia.       PAST MEDICAL HISTORY :  Past Medical History:  Diagnosis Date  . Abdominal aortic aneurysm (AAA) 3.0 cm to 5.0 cm in diameter in female [facility]) 05/2018   being followed by dr. Delana Meyer. will reultrasound in 1 year  . Anxiety   . Asthma   . Barrett's esophagus   . COPD (chronic obstructive pulmonary disease) ([place])   . Depression   . GERD (gastroesophageal reflux disease)   . History of peptic ulcer disease   . Hypercholesterolemia   . Low oxygen saturation    2l hs  . Osteopenia   . Panic disorder   . Personal history of radiation therapy   . Personal history of tobacco use, presenting hazards to health 01/16/2015  . Requires supplemental oxygen 05/2018   supposed to use 2L NP at night but she currently does not.  encouraged patient to resume this  . Sleep apnea    snores significantly but has never been tested for OSA  . SOB (shortness of breath)   . Squamous cell carcinoma of right lung ([place]) 2015    PAST SURGICAL HISTORY :   Past Surgical History:  Procedure Laterality Date  . CERVICAL CONE BIOPSY  1990  . CHOLECYSTECTOMY  1980's  . colonoscopy  2014  . COLONOSCOPY  07/21/2012  . COLONOSCOPY WITH PROPOFOL N/A 05/12/2018   Procedure: COLONOSCOPY WITH PROPOFOL;  Surgeon: Lollie Sails, MD;  Location:  [facility] ENDOSCOPY;  Service: Endoscopy;  Laterality: N/A;  . ELBOW FRACTURE SURGERY Right 2009  . ENDOBRONCHIAL ULTRASOUND N/A 02/07/2015   Procedure: ENDOBRONCHIAL ULTRASOUND;  Surgeon: Flora Lipps, MD;  Location: [facility] ORS;  Service: Cardiopulmonary;  Laterality: N/A;  . ENDOBRONCHIAL ULTRASOUND Left 06/05/2018   Procedure: ENDOBRONCHIAL ULTRASOUND;  Surgeon: Flora Lipps, MD;  Location: [facility] ORS;  Service: Cardiopulmonary;  Laterality: Left;  . ESOPHAGOGASTRODUODENOSCOPY (EGD) WITH PROPOFOL N/A 02/26/2016   Procedure: ESOPHAGOGASTRODUODENOSCOPY (EGD) WITH PROPOFOL;  Surgeon: Lollie Sails, MD;  Location: [facility] ENDOSCOPY;  Service: Endoscopy;  Laterality: N/A;  COPD, Sleep Apnea  . ESOPHAGOGASTRODUODENOSCOPY (EGD) WITH PROPOFOL N/A 05/12/2018   Procedure: ESOPHAGOGASTRODUODENOSCOPY (EGD) WITH PROPOFOL;  Surgeon: Lollie Sails, MD;  Location: [facility] ENDOSCOPY;  Service: Endoscopy;  Laterality: N/A;  . EYE SURGERY Bilateral    cataract extractions  . FRACTURE SURGERY  2005   elbow repair  . PORTA CATH INSERTION N/A 06/17/2018   Procedure: PORTA CATH INSERTION;  Surgeon: Katha Cabal, MD;  Location: [place] CV LAB;  Service: Cardiovascular;  Laterality: N/A;  . TONSILLECTOMY  1979  . TUBAL LIGATION  1970's  . UPPER GI ENDOSCOPY      FAMILY HISTORY :   Family History  Problem Relation Age of Onset  . Colon cancer Mother        colon  . Diabetes Mother   . Arthritis Mother   . Glaucoma Mother   . Stomach cancer Father        stomach  . Throat cancer Brother        throat  . Breast cancer Sister        breast  . Diabetes Sister   . Cirrhosis Sister   . Cancer Paternal Aunt   . Parkinson's disease Sister   . Heart attack Sister   . Ovarian cancer Sister     SOCIAL HISTORY:   Social History   Tobacco Use  . Smoking status: Former Smoker    Packs/day: 1.00    Years: 55.00    Pack years: 55.00    Types: Cigarettes    Last attempt to quit: 11/02/2017    Years since  quitting: 0.6  . Smokeless tobacco: Never Used  . Tobacco comment: used to use E-cig  Substance Use Topics  . Alcohol use: Yes    Alcohol/week: 0.0 standard drinks    Comment: occasionally- wine  . Drug use: No    ALLERGIES:  is allergic to amoxicillin.  MEDICATIONS:  Current Outpatient Medications  Medication Sig Dispense Refill  . albuterol (PROAIR HFA) 108 (90 Base) MCG/ACT inhaler Inhale 2 puffs into the lungs every 4 (four) hours as needed for wheezing or shortness of breath. 1 each 2  . buPROPion (WELLBUTRIN XL) 150 MG 24 hr tablet Take 1 tablet (150 mg total) by mouth daily. 90 tablet 1  . busPIRone (BUSPAR) 30 MG tablet TAKE 1 TABLET BY MOUTH TWICE DAILY (Patient taking differently: Take 30 mg by mouth 2 (two) times daily. ) 60 tablet 11  . CALCIUM CARBONATE-VIT D-MIN PO Take 1 tablet by mouth 2 (two) times daily.     . clotrimazole-betamethasone (LOTRISONE) cream 1 (one) Cream Cream Apply twice daily to affected area.  Not for Internal use. (Patient taking differently: Apply 1 application topically daily as needed (rash). 1 (one) Cream Cream Apply twice daily to affected area.  Not for Internal use.) 45 g 1  . escitalopram (LEXAPRO) 20 MG tablet TAKE 1 TABLET BY MOUTH ONCE DAILY (Patient taking differently: Take 20 mg by mouth daily. ) 90 tablet 1  . Fluticasone-Umeclidin-Vilant (TRELEGY ELLIPTA) 100-62.5-25 MCG/INH AEPB Inhale 1 puff into the lungs daily. 180 each 3  . lidocaine-prilocaine (EMLA) cream Apply 1 application topically as needed. 30 g 0  . Magnesium 250 MG TABS Take 250 mg by mouth daily.    . Omega-3 Fatty Acids (FISH OIL PO) Take 800 mg by mouth 2 (two) times daily.     Marland Kitchen omeprazole (PRILOSEC) 40 MG capsule Take 1 capsule (40 mg total) by mouth daily. 90 capsule 1  . ondansetron (ZOFRAN) 8 MG tablet Take 1 tablet (8 mg total) by mouth 3 (three) times daily as needed for nausea or vomiting. 30 tablet 2  . polyethylene glycol powder (GLYCOLAX/MIRALAX) powder TAKE 17  GRAMS OF POWDER MIXED IN 8 OUNCES OF WATER BY MOUTH ONCE A DAY. (Patient taking differently: Take 1 Container by mouth daily. TAKE 17 GRAMS OF POWDER MIXED IN 8 OUNCES OF WATER BY MOUTH ONCE  A DAY.) 255 g 3  . pravastatin (PRAVACHOL) 20 MG tablet TAKE 1 TABLET BY MOUTH AT BEDTIME (Patient taking differently: Take 20 mg by mouth at bedtime. ) 90 tablet 1  . prochlorperazine (COMPAZINE) 10 MG tablet Take 1 tablet (10 mg total) by mouth every 6 (six) hours as needed for nausea or vomiting. 30 tablet 2  . Respiratory Therapy Supplies (FLUTTER) DEVI 1 each by Does not apply route QID. 1 each 0  . rOPINIRole (REQUIP) 3 MG tablet TAKE 1 TABLET BY MOUTH AT BEDTIME (Patient taking differently: Take 3 mg by mouth at bedtime. ) 90 tablet 1   No current facility-administered medications for this visit.    Facility-Administered Medications Ordered in Other Visits  Medication Dose Route Frequency Provider Last Rate Last Dose  . etoposide (VEPESID) 220 mg in sodium chloride 0.9 % 1,000 mL chemo infusion  100 mg/m2 (Treatment Plan Recorded) Intravenous Once Cammie Sickle, MD 999 mL/hr at 06/23/18 1221      PHYSICAL EXAMINATION: ECOG PERFORMANCE STATUS: 1 - Symptomatic but completely ambulatory  BP (!) 150/94 (BP Location: Left Arm, Patient Position: Sitting, Cuff Size: Normal)   Pulse 83   Resp 16   Wt 218 lb (98.9 kg)   BMI 31.28 kg/m   Filed Weights   06/23/18 0952  Weight: 218 lb (98.9 kg)   Physical Exam  Constitutional: She is oriented to person, place, and time and well-developed, well-nourished, and in no distress.  She is alone.  Not on oxygen.  She is walking herself.  HENT:  Head: Normocephalic and atraumatic.  Mouth/Throat: Oropharynx is clear and moist. No oropharyngeal exudate.  Eyes: Pupils are equal, round, and reactive to light.  Neck: Normal range of motion. Neck supple.  Cardiovascular: Normal rate and regular rhythm.  Pulmonary/Chest: No respiratory distress. She has no  wheezes.  Decreased air entry bilaterally.  Abdominal: Soft. Bowel sounds are normal. She exhibits no distension and no mass. There is no abdominal tenderness. There is no rebound and no guarding.  Musculoskeletal: Normal range of motion.        General: No tenderness or edema.  Neurological: She is alert and oriented to person, place, and time.  Skin: Skin is warm.  Psychiatric: Affect normal.      LABORATORY DATA:  I have reviewed the data as listed    Component Value Date/Time   NA 138 06/23/2018 0929   NA 140 05/01/2018 1116   K 4.2 06/23/2018 0929   CL 104 06/23/2018 0929   CO2 26 06/23/2018 0929   GLUCOSE 102 (H) 06/23/2018 0929   BUN 18 06/23/2018 0929   BUN 15 05/01/2018 1116   CREATININE 0.71 06/23/2018 0929   CALCIUM 9.0 06/23/2018 0929   PROT 6.8 06/23/2018 0929   PROT 6.7 05/01/2018 1116   ALBUMIN 3.5 06/23/2018 0929   ALBUMIN 4.2 05/01/2018 1116   AST 17 06/23/2018 0929   ALT 14 06/23/2018 0929   ALKPHOS 77 06/23/2018 0929   BILITOT 0.4 06/23/2018 0929   BILITOT 0.3 05/01/2018 1116   GFRNONAA >60 06/23/2018 0929   GFRAA >60 06/23/2018 0929    No results found for: SPEP, UPEP  Lab Results  Component Value Date   WBC 7.4 06/23/2018   NEUTROABS 4.7 06/23/2018   HGB 12.9 06/23/2018   HCT 39.9 06/23/2018   MCV 92.8 06/23/2018   PLT 277 06/23/2018      Chemistry      Component Value Date/Time   NA 138 06/23/2018  0929   NA 140 05/01/2018 1116   K 4.2 06/23/2018 0929   CL 104 06/23/2018 0929   CO2 26 06/23/2018 0929   BUN 18 06/23/2018 0929   BUN 15 05/01/2018 1116   CREATININE 0.71 06/23/2018 0929      Component Value Date/Time   CALCIUM 9.0 06/23/2018 0929   ALKPHOS 77 06/23/2018 0929   AST 17 06/23/2018 0929   ALT 14 06/23/2018 0929   BILITOT 0.4 06/23/2018 0929   BILITOT 0.3 05/01/2018 1116       RADIOGRAPHIC STUDIES: I have personally reviewed the radiological images as listed and agreed with the findings in the report. No results  found.   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  TxN2-Stage III; proceed with carbo-Etop q 3 w- with concurrent RT.   # proceed with cycle #1 of carbo-etop today. Labs today reviewed;  acceptable for treatment today.  We will decrease the dose of carboplatin to AUC 4-given the upcoming radiation.. Plan starting RT when feasible.   #COPD stable.  Continue inhalers.  # DISPOSITION: # chemo today- # in 10 days- labs- cbc/bmp.  # follow up in 3 weeks/MD- labs- cbc/cmp; Carbo-Etop days-1; Etop 2&3-Dr.B   No orders of the defined types were placed in this encounter.  All questions were answered. The patient knows to call the clinic with any problems, questions or concerns.      Cammie Sickle, MD 06/23/2018 1:04 PM

## 2018-06-23 NOTE — Progress Notes (Signed)
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 female who presents to chemo care clinic today for initial meeting in preparation for starting chemotherapy. I introduced the chemo care clinic and we discussed that the role of the clinic is to assist those who are at an increased risk of emergency room visits and/or complications during the course of chemotherapy treatment. We discussed that the increased risk takes into account factors such as age, performance status, and co-morbidities. We also discussed that for some, this might include barriers to care such as not having a primary care provider, lack of insurance/transportation, or not being able to afford medications. We discussed that the goal of the program is to help prevent unplanned ER visits and help reduce complications during chemotherapy. We do this by discussing specific risk factors to each individual and identifying ways that we can help improve these risk factors and reduce barriers to care.   Hematology/Oncology History:  Oncology History   # SEP 2016-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 NOV 15th CT NED  # DEC-JAN 2020-CT/PET-Left hilar/mediastinal shift large cell neuroendocrine; Limited stage.TxN2-Stage III  # 18th Feb 2020- carbo-Etop with RT  # COPD on 02 at night; hx of smoking.    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)    Initial Diagnosis    Malignant neoplasm of hilus of left lung ([place])    06/23/2018 -  Chemotherapy    The patient had palonosetron (ALOXI) injection 0.25 mg, 0.25 mg, Intravenous,  Once, 1 of 4 cycles CARBOplatin (PARAPLATIN) 410 mg in sodium chloride 0.9 % 250 mL chemo infusion, 410 mg (100 % of original dose 412 mg), Intravenous,  Once, 1 of 4 cycles Dose modification:   (original dose 412 mg, Cycle 1) etoposide (VEPESID) 220 mg in sodium chloride 0.9 % 600 mL chemo infusion, 100 mg/m2 = 220 mg, Intravenous,  Once, 1 of 4 cycles  for chemotherapy treatment.      Cancer of lower lobe of right lung (HCC)     Allergies  Allergen Reactions  . Amoxicillin Hives and Itching    Did it involve swelling of the face/tongue/throat, SOB, or low BP? No Did it involve sudden or severe rash/hives, skin peeling, or any reaction on the inside of your mouth or nose? No Did you need to seek medical attention at a hospital or doctor's office? No When did it last happen?2018 If all above answers are "NO", may proceed with cephalosporin use.        Past Medical History:  Diagnosis Date  . Abdominal aortic aneurysm (AAA) 3.0 cm to 5.0 cm in diameter in female [facility]) 05/2018   being followed by dr. Delana Meyer. will reultrasound in 1 year  .  Anxiety   . Asthma   . Barrett's esophagus   . COPD (chronic obstructive pulmonary disease) ([place])   . Depression   . GERD (gastroesophageal reflux disease)   . History of peptic ulcer disease   . Hypercholesterolemia   . Low oxygen saturation    2l hs  . Osteopenia   . Panic disorder   . Personal history of radiation therapy   . Personal history of tobacco use, presenting hazards to health 01/16/2015  . Requires supplemental oxygen 05/2018    supposed to use 2L NP at night but she currently does not.  encouraged patient to resume this  . Sleep apnea    snores significantly but has never been tested for OSA  . SOB (shortness of breath)   . Squamous cell carcinoma of right lung ([place]) 2015     Past Surgical History:  Procedure Laterality Date  . CERVICAL CONE BIOPSY  1990  . CHOLECYSTECTOMY  1980's  . colonoscopy  2014  . COLONOSCOPY  07/21/2012  . COLONOSCOPY WITH PROPOFOL N/A 05/12/2018   Procedure: COLONOSCOPY WITH PROPOFOL;  Surgeon: Lollie Sails, MD;  Location: [facility] ENDOSCOPY;  Service: Endoscopy;  Laterality: N/A;  . ELBOW FRACTURE SURGERY Right 2009  . ENDOBRONCHIAL ULTRASOUND N/A 02/07/2015   Procedure: ENDOBRONCHIAL ULTRASOUND;  Surgeon: Flora Lipps, MD;  Location: [facility] ORS;  Service: Cardiopulmonary;  Laterality: N/A;  . ENDOBRONCHIAL ULTRASOUND Left 06/05/2018   Procedure: ENDOBRONCHIAL ULTRASOUND;  Surgeon: Flora Lipps, MD;  Location: [facility] ORS;  Service: Cardiopulmonary;  Laterality: Left;  . ESOPHAGOGASTRODUODENOSCOPY (EGD) WITH PROPOFOL N/A 02/26/2016   Procedure: ESOPHAGOGASTRODUODENOSCOPY (EGD) WITH PROPOFOL;  Surgeon: Lollie Sails, MD;  Location: [facility] ENDOSCOPY;  Service: Endoscopy;  Laterality: N/A;  COPD, Sleep Apnea  . ESOPHAGOGASTRODUODENOSCOPY (EGD) WITH PROPOFOL N/A 05/12/2018   Procedure: ESOPHAGOGASTRODUODENOSCOPY (EGD) WITH PROPOFOL;  Surgeon: Lollie Sails, MD;  Location: [facility] ENDOSCOPY;  Service: Endoscopy;  Laterality: N/A;  . EYE SURGERY Bilateral    cataract extractions  . FRACTURE SURGERY  2005   elbow repair  . PORTA CATH INSERTION N/A 06/17/2018   Procedure: PORTA CATH INSERTION;  Surgeon: Katha Cabal, MD;  Location: [facility] CV LAB;  Service: Cardiovascular;  Laterality: N/A;  . TONSILLECTOMY  1979  . TUBAL LIGATION  1970's  . UPPER GI ENDOSCOPY      Social History   Socioeconomic History  . Marital status: Widowed    Spouse name: Not on file  . Number of  children: 3  . Years of education: Not on file  . Highest education level: 12th grade  Occupational History  . 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 Financial resource strain: Not hard at all  . Food insecurity:    Worry: Never true    Inability: Never true  . Transportation needs:    Medical: No    Non-medical: No  Tobacco Use  . Smoking status: Former Smoker    Packs/day: 1.00    Years: 55.00    Pack years: 55.00    Types: Cigarettes    Last attempt to quit: 11/02/2017    Years since quitting: 0.6  . Smokeless tobacco: Never Used  . Tobacco comment: used to use E-cig  Substance and Sexual Activity  . Alcohol use: Yes    Alcohol/week: 0.0 standard drinks    Comment: occasionally- wine  . Drug use: No  . Sexual activity: Not Currently  Lifestyle  . Physical activity:  Days per week: Not on file    Minutes per session: Not on file  . Stress: Not at all  Relationships  . Social connections:    Talks on phone: Not on file    Gets together: Not on file    Attends religious service: Not on file    Active member of club or organization: Not on file    Attends meetings of clubs or organizations: Not on file    Relationship status: Not on file  . Intimate partner violence:    Fear of current or ex partner: No    Emotionally abused: No    Physically abused: No    Forced sexual activity: No  Other Topics Concern  . Not on file  Social History Narrative   Grandson lives with patient. Is available to help her as needed.    Family History  Problem Relation Age of Onset  . Colon cancer Mother        colon  . Diabetes Mother   . Arthritis Mother   . Glaucoma Mother   . Stomach cancer Father        stomach  . Throat cancer Brother        throat  . Breast cancer Sister        breast  . Diabetes Sister   . Cirrhosis Sister   . Cancer Paternal Aunt   . Parkinson's disease Sister   . Heart attack Sister   . Ovarian  cancer Sister     Current Outpatient Medications  Medication Sig Dispense Refill  . albuterol (PROAIR HFA) 108 (90 Base) MCG/ACT inhaler Inhale 2 puffs into the lungs every 4 (four) hours as needed for wheezing or shortness of breath. 1 each 2  . buPROPion (WELLBUTRIN XL) 150 MG 24 hr tablet Take 1 tablet (150 mg total) by mouth daily. 90 tablet 1  . busPIRone (BUSPAR) 30 MG tablet TAKE 1 TABLET BY MOUTH TWICE DAILY (Patient taking differently: Take 30 mg by mouth 2 (two) times daily. ) 60 tablet 11  . CALCIUM CARBONATE-VIT D-MIN PO Take 1 tablet by mouth 2 (two) times daily.     . clotrimazole-betamethasone (LOTRISONE) cream 1 (one) Cream Cream Apply twice daily to affected area.  Not for Internal use. (Patient taking differently: Apply 1 application topically daily as needed (rash). 1 (one) Cream Cream Apply twice daily to affected area.  Not for Internal use.) 45 g 1  . escitalopram (LEXAPRO) 20 MG tablet TAKE 1 TABLET BY MOUTH ONCE DAILY (Patient taking differently: Take 20 mg by mouth daily. ) 90 tablet 1  . Fluticasone-Umeclidin-Vilant (TRELEGY ELLIPTA) 100-62.5-25 MCG/INH AEPB Inhale 1 puff into the lungs daily. 180 each 3  . lidocaine-prilocaine (EMLA) cream Apply 1 application topically as needed. 30 g 0  . Magnesium 250 MG TABS Take 250 mg by mouth daily.    . Omega-3 Fatty Acids (FISH OIL PO) Take 800 mg by mouth 2 (two) times daily.     Marland Kitchen omeprazole (PRILOSEC) 40 MG capsule Take 1 capsule (40 mg total) by mouth daily. 90 capsule 1  . ondansetron (ZOFRAN) 8 MG tablet Take 1 tablet (8 mg total) by mouth 3 (three) times daily as needed for nausea or vomiting. 30 tablet 2  . polyethylene glycol powder (GLYCOLAX/MIRALAX) powder TAKE 17 GRAMS OF POWDER MIXED IN 8 OUNCES OF WATER BY MOUTH ONCE A DAY. (Patient taking differently: Take 1 Container by mouth daily. TAKE 17 GRAMS OF POWDER MIXED IN 8 OUNCES  OF WATER BY MOUTH ONCE A DAY.) 255 g 3  . pravastatin (PRAVACHOL) 20 MG tablet TAKE 1 TABLET  BY MOUTH AT BEDTIME (Patient taking differently: Take 20 mg by mouth at bedtime. ) 90 tablet 1  . prochlorperazine (COMPAZINE) 10 MG tablet Take 1 tablet (10 mg total) by mouth every 6 (six) hours as needed for nausea or vomiting. 30 tablet 2  . Respiratory Therapy Supplies (FLUTTER) DEVI 1 each by Does not apply route QID. 1 each 0  . rOPINIRole (REQUIP) 3 MG tablet TAKE 1 TABLET BY MOUTH AT BEDTIME (Patient taking differently: Take 3 mg by mouth at bedtime. ) 90 tablet 1   No current facility-administered medications for this visit.    Facility-Administered Medications Ordered in Other Visits  Medication Dose Route Frequency Provider Last Rate Last Dose  . etoposide (VEPESID) 220 mg in sodium chloride 0.9 % 1,000 mL chemo infusion  100 mg/m2 (Treatment Plan Recorded) Intravenous Once Cammie Sickle, MD 999 mL/hr at 06/23/18 1221       PERFORMANCE STATUS (ECOG) : 0 - Asymptomatic  Review of Systems As noted above. Otherwise, a complete review of systems is negative.  Physical Exam General: NAD, frail appearing, thin Cardiovascular: regular rate and rhythm Pulmonary: clear ant fields Abdomen: soft, nontender, + bowel sounds GU: no suprapubic tenderness Extremities: no edema, no joint deformities Skin: no rashes Neurological: Weakness but otherwise nonfocal   Assessment and Plan:    1. 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 Rogue Bussing.   2. High Risk for ER/Hospitalization during Chemotherapy: We discussed the role of the chemo care clinic and identified patient specific risk factors. I discussed that patient was identified as high risk primarily based on: comorbidities. We also discussed the role of the Symptom Management and Palliative Care Clinics at [facility] and methods of contacting clinic/provider. She denies needing specific assistance at this time.  Current PCP: Mar Daring, [facility] Admissions: 0  ED  Visits: 1  Has Medicaid: No  Has Medicare: Yes  In relationship: No  Has Anemia: No  Has asthma: Yes  Has atrial fibrillation: No  Has CVD: No  Has chronic kidney disease: No  Has Chronic Obstructive Pulmonary Disease: Yes  Has Congestive Heart Failure: No  Has Connective Tissue Disorder: No  Has Depression: Yes  Has Diabetes: No  Has liver disease: No  Has Peripheral Vascular Disease: Yes     3. Social Determinants of Health:   Housing - Patient lives at home with her grandson  Food - Denies any concerns  Transportation - Drives herself. She describes herself as independent and wants to care for herself as long as she can.   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  Family/Community Support - Has three daughters and feels she has good family/community support. Her neighbor is also a patient at the [facility].   Physical Activity -  She reports being independent with all care in the home.   4. Co-morbidities Complicating Care: COPD seems to be well controlled. Patient has no recent hospitalizations for acute exacerbation. She no longer smokes tobacco. She has been seen by pulmonary. She has a PCP, who she last saw in 04/2018. Patient says she has all of her prescribed medications in the home and takes them as directed.  No questions or concerns today.     Patient expressed understanding and  was in agreement with this plan. She also understands that She can call clinic at any time with any questions, concerns, or complaints.   A total of (10) minutes of face-to-face time was spent with this patient with greater than 50% of that time in counseling and care-coordination.   Signed by: Altha Harm, PhD, DNP, NP-C, [facility] [phone] (Work Cell)

## 2018-06-23 NOTE — Consult Note (Signed)
NEW PATIENT EVALUATION  Name: Cathy Watts  MRN: [id number]  Date:   06/23/2018     DOB: October 30, 1945   This 73 y.o. 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  REFERRING PHYSICIAN: Cammie Sickle, *  CHIEF COMPLAINT:  Chief Complaint  Patient presents with  . Lung Cancer    Initial Eval    DIAGNOSIS: The encounter diagnosis was Malignant neoplasm of hilus of left lung ([place]).   PREVIOUS INVESTIGATIONS:  PET scan and CT scans reviewed Pathology reports reviewed Clinical notes reviewed  HPI: patient is a 73 year old female well-known to department having been treated over 3 years prior to her right lower lobe for a stage I a squamous cell carcinoma. In January 2020 she was noted to have progression of mass in the left hilar region. Fine-needle aspiration CT-guided favors high-grade non-small cell cancer consistent with neuroendocrine features. Patient symptomatically is doing fairly well has a nonproductive cough no weight loss dysphasia or bone pain.initial CT scan showed similar appearance to her right hilum consistent with previous radiation treatment was also new left hilar adenopathy. PET CT was performed showing AP window and left hilar nodal metastasis suggestive of recurrent or metastatic disease. Right chest favor radiation changes. This is what prompted CT-guided biopsy. Patient is been seen by medical oncology and plan is for concurrent chemoradiation. She is seen today for radiation oncology opinion.  PLANNED TREATMENT REGIMEN: concurrent chemoradiation  PAST MEDICAL HISTORY:  has a past medical history of Abdominal aortic aneurysm (AAA) 3.0 cm to 5.0 cm in diameter in female [facility]) (05/2018), Anxiety, Asthma, Barrett's esophagus, COPD (chronic obstructive pulmonary disease) ([place]),  Depression, GERD (gastroesophageal reflux disease), History of peptic ulcer disease, Hypercholesterolemia, Low oxygen saturation, Osteopenia, Panic disorder, Personal history of radiation therapy, Personal history of tobacco use, presenting hazards to health (01/16/2015), Requires supplemental oxygen (05/2018), Sleep apnea, SOB (shortness of breath), and Squamous cell carcinoma of right lung ([place]) (2015).    PAST SURGICAL HISTORY:  Past Surgical History:  Procedure Laterality Date  . CERVICAL CONE BIOPSY  1990  . CHOLECYSTECTOMY  1980's  . colonoscopy  2014  . COLONOSCOPY  07/21/2012  . COLONOSCOPY WITH PROPOFOL N/A 05/12/2018   Procedure: COLONOSCOPY WITH PROPOFOL;  Surgeon: Lollie Sails, MD;  Location: [facility] ENDOSCOPY;  Service: Endoscopy;  Laterality: N/A;  . ELBOW FRACTURE SURGERY Right 2009  . ENDOBRONCHIAL ULTRASOUND N/A 02/07/2015   Procedure: ENDOBRONCHIAL ULTRASOUND;  Surgeon: Flora Lipps, MD;  Location: [facility] ORS;  Service: Cardiopulmonary;  Laterality: N/A;  . ENDOBRONCHIAL ULTRASOUND Left 06/05/2018   Procedure: ENDOBRONCHIAL ULTRASOUND;  Surgeon: Flora Lipps, MD;  Location: [facility] ORS;  Service: Cardiopulmonary;  Laterality: Left;  . ESOPHAGOGASTRODUODENOSCOPY (EGD) WITH PROPOFOL N/A 02/26/2016   Procedure: ESOPHAGOGASTRODUODENOSCOPY (EGD) WITH PROPOFOL;  Surgeon: Lollie Sails, MD;  Location: [facility] ENDOSCOPY;  Service: Endoscopy;  Laterality: N/A;  COPD, Sleep Apnea  . ESOPHAGOGASTRODUODENOSCOPY (EGD) WITH PROPOFOL N/A 05/12/2018   Procedure: ESOPHAGOGASTRODUODENOSCOPY (EGD) WITH PROPOFOL;  Surgeon: Lollie Sails, MD;  Location: [facility] ENDOSCOPY;  Service: Endoscopy;  Laterality: N/A;  . EYE SURGERY Bilateral    cataract extractions  . FRACTURE SURGERY  2005   elbow repair  . PORTA CATH INSERTION N/A 06/17/2018   Procedure: PORTA CATH INSERTION;  Surgeon: Katha Cabal, MD;  Location: [facility] LAB;  Service:  Cardiovascular;  Laterality: N/A;  . TONSILLECTOMY   1979  . TUBAL LIGATION  1970's  . UPPER GI ENDOSCOPY      FAMILY HISTORY: family history includes Arthritis in her mother; Breast cancer in her sister; Cancer in her paternal aunt; Cirrhosis in her sister; Colon cancer in her mother; Diabetes in her mother and sister; Glaucoma in her mother; Heart attack in her sister; Ovarian cancer in her sister; Parkinson's disease in her sister; Stomach cancer in her father; Throat cancer in her brother.  SOCIAL HISTORY:  reports that she quit smoking about 7 months ago. Her smoking use included cigarettes. She has a 55.00 pack-year smoking history. She has never used smokeless tobacco. She reports current alcohol use. She reports that she does not use drugs.  ALLERGIES: Amoxicillin  MEDICATIONS:  Current Outpatient Medications  Medication Sig Dispense Refill  . albuterol (PROAIR HFA) 108 (90 Base) MCG/ACT inhaler Inhale 2 puffs into the lungs every 4 (four) hours as needed for wheezing or shortness of breath. 1 each 2  . buPROPion (WELLBUTRIN XL) 150 MG 24 hr tablet Take 1 tablet (150 mg total) by mouth daily. 90 tablet 1  . busPIRone (BUSPAR) 30 MG tablet TAKE 1 TABLET BY MOUTH TWICE DAILY (Patient taking differently: Take 30 mg by mouth 2 (two) times daily. ) 60 tablet 11  . CALCIUM CARBONATE-VIT D-MIN PO Take 1 tablet by mouth 2 (two) times daily.     . clotrimazole-betamethasone (LOTRISONE) cream 1 (one) Cream Cream Apply twice daily to affected area.  Not for Internal use. (Patient taking differently: Apply 1 application topically daily as needed (rash). 1 (one) Cream Cream Apply twice daily to affected area.  Not for Internal use.) 45 g 1  . escitalopram (LEXAPRO) 20 MG tablet TAKE 1 TABLET BY MOUTH ONCE DAILY (Patient taking differently: Take 20 mg by mouth daily. ) 90 tablet 1  . Fluticasone-Umeclidin-Vilant (TRELEGY ELLIPTA) 100-62.5-25 MCG/INH AEPB Inhale 1 puff into the lungs daily. 180 each 3  . lidocaine-prilocaine (EMLA) cream Apply 1  application topically as needed. 30 g 0  . Magnesium 250 MG TABS Take 250 mg by mouth daily.    . Omega-3 Fatty Acids (FISH OIL PO) Take 800 mg by mouth 2 (two) times daily.     Marland Kitchen omeprazole (PRILOSEC) 40 MG capsule Take 1 capsule (40 mg total) by mouth daily. 90 capsule 1  . ondansetron (ZOFRAN) 8 MG tablet Take 1 tablet (8 mg total) by mouth 3 (three) times daily as needed for nausea or vomiting. 30 tablet 2  . polyethylene glycol powder (GLYCOLAX/MIRALAX) powder TAKE 17 GRAMS OF POWDER MIXED IN 8 OUNCES OF WATER BY MOUTH ONCE A DAY. (Patient taking differently: Take 1 Container by mouth daily. TAKE 17 GRAMS OF POWDER MIXED IN 8 OUNCES OF WATER BY MOUTH ONCE A DAY.) 255 g 3  . pravastatin (PRAVACHOL) 20 MG tablet TAKE 1 TABLET BY MOUTH AT BEDTIME (Patient taking differently: Take 20 mg by mouth at bedtime. ) 90 tablet 1  . prochlorperazine (COMPAZINE) 10 MG tablet Take 1 tablet (10 mg total) by mouth every 6 (six) hours as needed for nausea or vomiting. 30 tablet 2  . Respiratory Therapy Supplies (FLUTTER) DEVI 1 each by Does not apply route QID. 1 each 0  . rOPINIRole (REQUIP) 3 MG tablet TAKE 1 TABLET BY MOUTH AT BEDTIME (Patient taking differently: Take 3 mg by mouth at bedtime. ) 90 tablet 1   No current facility-administered medications for this encounter.  Facility-Administered Medications Ordered in Other Encounters  Medication Dose Route Frequency Provider Last Rate Last Dose  . etoposide (VEPESID) 220 mg in sodium chloride 0.9 % 1,000 mL chemo infusion  100 mg/m2 (Treatment Plan Recorded) Intravenous Once Cammie Sickle, MD 999 mL/hr at 06/23/18 1221      ECOG PERFORMANCE STATUS:  0 - Asymptomatic  REVIEW OF SYSTEMS:  Patient denies any weight loss, fatigue, weakness, fever, chills or night sweats. Patient denies any loss of vision, blurred vision. Patient denies any ringing  of the ears or hearing loss. No irregular heartbeat. Patient denies heart murmur or history of  fainting. Patient denies any chest pain or pain radiating to her upper extremities. Patient denies any shortness of breath, difficulty breathing at night, cough or hemoptysis. Patient denies any swelling in the lower legs. Patient denies any nausea vomiting, vomiting of blood, or coffee ground material in the vomitus. Patient denies any stomach pain. Patient states has had normal bowel movements no significant constipation or diarrhea. Patient denies any dysuria, hematuria or significant nocturia. Patient denies any problems walking, swelling in the joints or loss of balance. Patient denies any skin changes, loss of hair or loss of weight. Patient denies any excessive worrying or anxiety or significant depression. Patient denies any problems with insomnia. Patient denies excessive thirst, polyuria, polydipsia. Patient denies any swollen glands, patient denies easy bruising or easy bleeding. Patient denies any recent infections, allergies or URI. Patient "s visual fields have not changed significantly in recent time.    PHYSICAL EXAM: There were no vitals taken for this visit. Well-developed well-nourished patient in NAD. HEENT reveals PERLA, EOMI, discs not visualized.  Oral cavity is clear. No oral mucosal lesions are identified. Neck is clear without evidence of cervical or supraclavicular adenopathy. Lungs are clear to A&P. Cardiac examination is essentially unremarkable with regular rate and rhythm without murmur rub or thrill. Abdomen is benign with no organomegaly or masses noted. Motor sensory and DTR levels are equal and symmetric in the upper and lower extremities. Cranial nerves II through XII are grossly intact. Proprioception is intact. No peripheral adenopathy or edema is identified. No motor or sensory levels are noted. Crude visual fields are within normal range.  LABORATORY DATA: pathology reports reviewed    RADIOLOGY RESULTS:CT scans and PET/CT scans reviewed   IMPRESSION: stage III  neuroendocrine tumor of the left lung hilum in 73 year old female previous sutures to right lower lobe for stage I squamous cell carcinoma  PLAN: this time I to go ahead with concurrent chemoradiation. Would plan on delivering 6600 cGy over 6-7 weeks. Would use PET/CT fusion study for treatment planning. Risks and benefits of treatment including increased cough fatigue possible radiation esophagitis alteration of blood counts and skin reaction all were discussed with the patient. She seems to comprehend my treatment plan well. I have personally set up and ordered CT simulation for later this week and will coordinate her chemotherapy with our radiation treatment schedule.There will be extra effort by both professional staff as well as technical staff to coordinate and manage concurrent chemoradiation and ensuing side effects duringher treatments.  I would like to take this opportunity to thank you for allowing me to participate in the care of your patient.Noreene Filbert, MD

## 2018-06-23 NOTE — Assessment & Plan Note (Addendum)
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  TxN2-Stage III; proceed with carbo-Etop q 3 w- with concurrent RT.   # proceed with cycle #1 of carbo-etop today. Labs today reviewed;  acceptable for treatment today.  We will decrease the dose of carboplatin to AUC 4-given the upcoming radiation.. Plan starting RT when feasible.   #COPD stable.  Continue inhalers.  # DISPOSITION: # chemo today- # in 10 days- labs- cbc/bmp.  # follow up in 3 weeks/MD- labs- cbc/cmp; Carbo-Etop days-1; Etop 2&3-Dr.B

## 2018-06-24 ENCOUNTER — Telehealth: Payer: Self-pay

## 2018-06-24 ENCOUNTER — Inpatient Hospital Stay: Payer: Medicare Other

## 2018-06-24 ENCOUNTER — Ambulatory Visit
Admission: RE | Admit: 2018-06-24 | Discharge: 2018-06-24 | Disposition: A | Discharge status: Home / Self Care | Payer: Medicare Other | Source: Ambulatory Visit | Attending: Physician Assistant | Admitting: Physician Assistant

## 2018-06-24 VITALS — BP 130/78 | HR 85 | Temp 98.7°F | Resp 18

## 2018-06-24 DIAGNOSIS — Z780 Asymptomatic menopausal state: Secondary | ICD-10-CM

## 2018-06-24 DIAGNOSIS — M810 Age-related osteoporosis without current pathological fracture: Secondary | ICD-10-CM | POA: Insufficient documentation

## 2018-06-24 DIAGNOSIS — Z5111 Encounter for antineoplastic chemotherapy: Secondary | ICD-10-CM | POA: Diagnosis not present

## 2018-06-24 DIAGNOSIS — C3402 Malignant neoplasm of left main bronchus: Secondary | ICD-10-CM

## 2018-06-24 MED ORDER — SODIUM CHLORIDE 0.9% FLUSH
10.0000 mL | INTRAVENOUS | Status: DC | PRN
  Administered 2018-06-24: 10 mL via INTRAVENOUS
  Filled 2018-06-24: qty 10

## 2018-06-24 MED ORDER — DEXAMETHASONE SODIUM PHOSPHATE 10 MG/ML IJ SOLN
10.0000 mg | Freq: Once | INTRAMUSCULAR | Status: AC
  Administered 2018-06-24: 10 mg via INTRAVENOUS
  Filled 2018-06-24: qty 1

## 2018-06-24 MED ORDER — SODIUM CHLORIDE 0.9 % IV SOLN
Freq: Once | INTRAVENOUS | Status: AC
  Administered 2018-06-24: 11:00:00 via INTRAVENOUS
  Filled 2018-06-24: qty 250

## 2018-06-24 MED ORDER — SODIUM CHLORIDE 0.9 % IV SOLN
100.0000 mg/m2 | Freq: Once | INTRAVENOUS | Status: AC
  Administered 2018-06-24: 220 mg via INTRAVENOUS
  Filled 2018-06-24: qty 11

## 2018-06-24 MED ORDER — HEPARIN SOD (PORK) LOCK FLUSH 100 UNIT/ML IV SOLN
500.0000 [IU] | Freq: Once | INTRAVENOUS | Status: AC
  Administered 2018-06-24: 500 [IU] via INTRAVENOUS

## 2018-06-24 NOTE — Telephone Encounter (Signed)
-----   Message from Mar Daring, [place] sent at 06/24/2018 11:32 AM EST ----- Bone density does show osteoporosis. Are you wanting to discuss treatments for osteoporosis? If so please come in to discuss the options.

## 2018-06-24 NOTE — Telephone Encounter (Signed)
Viewed by [name] on 06/24/2018 11:33 AM

## 2018-06-25 ENCOUNTER — Inpatient Hospital Stay: Payer: Medicare Other

## 2018-06-25 ENCOUNTER — Ambulatory Visit
Admission: RE | Admit: 2018-06-25 | Discharge: 2018-06-25 | Disposition: A | Discharge status: Home / Self Care | Payer: Medicare Other | Source: Ambulatory Visit | Attending: Radiation Oncology | Admitting: Radiation Oncology

## 2018-06-25 ENCOUNTER — Other Ambulatory Visit: Payer: Self-pay | Admitting: Internal Medicine

## 2018-06-25 VITALS — BP 159/88 | HR 69 | Temp 97.0°F | Resp 20

## 2018-06-25 DIAGNOSIS — C3431 Malignant neoplasm of lower lobe, right bronchus or lung: Secondary | ICD-10-CM | POA: Diagnosis not present

## 2018-06-25 DIAGNOSIS — Z5111 Encounter for antineoplastic chemotherapy: Secondary | ICD-10-CM | POA: Diagnosis not present

## 2018-06-25 DIAGNOSIS — Z510 Encounter for antineoplastic radiation therapy: Secondary | ICD-10-CM | POA: Diagnosis not present

## 2018-06-25 DIAGNOSIS — C3402 Malignant neoplasm of left main bronchus: Secondary | ICD-10-CM | POA: Diagnosis not present

## 2018-06-25 MED ORDER — HEPARIN SOD (PORK) LOCK FLUSH 100 UNIT/ML IV SOLN
500.0000 [IU] | Freq: Once | INTRAVENOUS | Status: AC | PRN
  Administered 2018-06-25: 500 [IU]
  Filled 2018-06-25: qty 5

## 2018-06-25 MED ORDER — SODIUM CHLORIDE 0.9% FLUSH
10.0000 mL | INTRAVENOUS | Status: DC | PRN
  Administered 2018-06-25: 10 mL
  Filled 2018-06-25: qty 10

## 2018-06-25 MED ORDER — SODIUM CHLORIDE 0.9 % IV SOLN
100.0000 mg/m2 | Freq: Once | INTRAVENOUS | Status: AC
  Administered 2018-06-25: 220 mg via INTRAVENOUS
  Filled 2018-06-25: qty 11

## 2018-06-25 MED ORDER — SODIUM CHLORIDE 0.9 % IV SOLN
Freq: Once | INTRAVENOUS | Status: AC
  Administered 2018-06-25: 11:00:00 via INTRAVENOUS
  Filled 2018-06-25: qty 250

## 2018-06-25 MED ORDER — DEXAMETHASONE SODIUM PHOSPHATE 10 MG/ML IJ SOLN
10.0000 mg | Freq: Once | INTRAMUSCULAR | Status: AC
  Administered 2018-06-25: 10 mg via INTRAVENOUS
  Filled 2018-06-25: qty 1

## 2018-06-29 DIAGNOSIS — Z510 Encounter for antineoplastic radiation therapy: Secondary | ICD-10-CM | POA: Diagnosis not present

## 2018-07-03 ENCOUNTER — Other Ambulatory Visit: Payer: Self-pay | Admitting: *Deleted

## 2018-07-03 ENCOUNTER — Inpatient Hospital Stay: Payer: Medicare Other

## 2018-07-03 DIAGNOSIS — C3402 Malignant neoplasm of left main bronchus: Secondary | ICD-10-CM

## 2018-07-03 DIAGNOSIS — Z5111 Encounter for antineoplastic chemotherapy: Secondary | ICD-10-CM | POA: Diagnosis not present

## 2018-07-03 LAB — CBC WITH DIFFERENTIAL/PLATELET
Abs Immature Granulocytes: 0.01 10*3/uL (ref 0.00–0.07)
BASOS ABS: 0.1 10*3/uL (ref 0.0–0.1)
BASOS PCT: 2 %
Eosinophils Absolute: 0.1 10*3/uL (ref 0.0–0.5)
Eosinophils Relative: 3 %
HCT: 38.1 % (ref 36.0–46.0)
Hemoglobin: 12.3 g/dL (ref 12.0–15.0)
Immature Granulocytes: 0 %
Lymphocytes Relative: 26 %
Lymphs Abs: 0.9 10*3/uL (ref 0.7–4.0)
MCH: 29.8 pg (ref 26.0–34.0)
MCHC: 32.3 g/dL (ref 30.0–36.0)
MCV: 92.3 fL (ref 80.0–100.0)
Monocytes Absolute: 0.3 10*3/uL (ref 0.1–1.0)
Monocytes Relative: 10 %
NRBC: 0 % (ref 0.0–0.2)
Neutro Abs: 2 10*3/uL (ref 1.7–7.7)
Neutrophils Relative %: 59 %
PLATELETS: 171 10*3/uL (ref 150–400)
RBC: 4.13 MIL/uL (ref 3.87–5.11)
RDW: 12.4 % (ref 11.5–15.5)
WBC: 3.3 10*3/uL — AB (ref 4.0–10.5)

## 2018-07-03 LAB — BASIC METABOLIC PANEL
Anion gap: 7 (ref 5–15)
BUN: 15 mg/dL (ref 8–23)
CO2: 26 mmol/L (ref 22–32)
CREATININE: 0.74 mg/dL (ref 0.44–1.00)
Calcium: 8.9 mg/dL (ref 8.9–10.3)
Chloride: 103 mmol/L (ref 98–111)
GFR calc Af Amer: >60 mL/min (ref >60)
GFR calc non Af Amer: >60 mL/min (ref >60)
Glucose, Bld: 104 mg/dL — ABNORMAL HIGH (ref 70–99)
Potassium: 4.3 mmol/L (ref 3.5–5.1)
SODIUM: 136 mmol/L (ref 135–145)

## 2018-07-06 ENCOUNTER — Ambulatory Visit
Admission: RE | Admit: 2018-07-06 | Discharge: 2018-07-06 | Disposition: A | Discharge status: Home / Self Care | Payer: Medicare Other | Source: Ambulatory Visit | Attending: Radiation Oncology | Admitting: Radiation Oncology

## 2018-07-06 DIAGNOSIS — C3402 Malignant neoplasm of left main bronchus: Secondary | ICD-10-CM | POA: Insufficient documentation

## 2018-07-06 DIAGNOSIS — Z510 Encounter for antineoplastic radiation therapy: Secondary | ICD-10-CM | POA: Insufficient documentation

## 2018-07-06 DIAGNOSIS — C3431 Malignant neoplasm of lower lobe, right bronchus or lung: Secondary | ICD-10-CM | POA: Insufficient documentation

## 2018-07-07 ENCOUNTER — Ambulatory Visit
Admission: RE | Admit: 2018-07-07 | Discharge: 2018-07-07 | Disposition: A | Discharge status: Home / Self Care | Payer: Medicare Other | Source: Ambulatory Visit | Attending: Radiation Oncology | Admitting: Radiation Oncology

## 2018-07-07 DIAGNOSIS — Z510 Encounter for antineoplastic radiation therapy: Secondary | ICD-10-CM | POA: Diagnosis present

## 2018-07-07 DIAGNOSIS — C3431 Malignant neoplasm of lower lobe, right bronchus or lung: Secondary | ICD-10-CM | POA: Diagnosis not present

## 2018-07-07 DIAGNOSIS — C3402 Malignant neoplasm of left main bronchus: Secondary | ICD-10-CM | POA: Diagnosis not present

## 2018-07-08 ENCOUNTER — Ambulatory Visit
Admission: RE | Admit: 2018-07-08 | Discharge: 2018-07-08 | Disposition: A | Discharge status: Home / Self Care | Payer: Medicare Other | Source: Ambulatory Visit | Attending: Radiation Oncology | Admitting: Radiation Oncology

## 2018-07-08 DIAGNOSIS — Z510 Encounter for antineoplastic radiation therapy: Secondary | ICD-10-CM | POA: Diagnosis not present

## 2018-07-09 ENCOUNTER — Ambulatory Visit
Admission: RE | Admit: 2018-07-09 | Discharge: 2018-07-09 | Disposition: A | Discharge status: Home / Self Care | Payer: Medicare Other | Source: Ambulatory Visit | Attending: Radiation Oncology | Admitting: Radiation Oncology

## 2018-07-09 DIAGNOSIS — Z510 Encounter for antineoplastic radiation therapy: Secondary | ICD-10-CM | POA: Diagnosis not present

## 2018-07-10 ENCOUNTER — Ambulatory Visit
Admission: RE | Admit: 2018-07-10 | Discharge: 2018-07-10 | Disposition: A | Discharge status: Home / Self Care | Payer: Medicare Other | Source: Ambulatory Visit | Attending: Radiation Oncology | Admitting: Radiation Oncology

## 2018-07-10 DIAGNOSIS — Z510 Encounter for antineoplastic radiation therapy: Secondary | ICD-10-CM | POA: Diagnosis not present

## 2018-07-13 ENCOUNTER — Ambulatory Visit
Admission: RE | Admit: 2018-07-13 | Discharge: 2018-07-13 | Disposition: A | Discharge status: Home / Self Care | Payer: Medicare Other | Source: Ambulatory Visit | Attending: Radiation Oncology | Admitting: Radiation Oncology

## 2018-07-13 DIAGNOSIS — Z510 Encounter for antineoplastic radiation therapy: Secondary | ICD-10-CM | POA: Diagnosis not present

## 2018-07-14 ENCOUNTER — Ambulatory Visit
Admission: RE | Admit: 2018-07-14 | Discharge: 2018-07-14 | Disposition: A | Discharge status: Home / Self Care | Payer: Medicare Other | Source: Ambulatory Visit | Attending: Radiation Oncology | Admitting: Radiation Oncology

## 2018-07-14 ENCOUNTER — Inpatient Hospital Stay (HOSPITAL_BASED_OUTPATIENT_CLINIC_OR_DEPARTMENT_OTHER): Payer: Medicare Other | Admitting: Internal Medicine

## 2018-07-14 ENCOUNTER — Encounter: Payer: Self-pay | Admitting: Internal Medicine

## 2018-07-14 ENCOUNTER — Other Ambulatory Visit: Payer: Self-pay

## 2018-07-14 ENCOUNTER — Inpatient Hospital Stay: Payer: Medicare Other

## 2018-07-14 ENCOUNTER — Inpatient Hospital Stay: Payer: Medicare Other | Attending: Internal Medicine

## 2018-07-14 VITALS — BP 151/92 | HR 89 | Temp 97.6°F | Resp 16 | Wt 220.0 lb

## 2018-07-14 DIAGNOSIS — J449 Chronic obstructive pulmonary disease, unspecified: Secondary | ICD-10-CM | POA: Insufficient documentation

## 2018-07-14 DIAGNOSIS — Z800 Family history of malignant neoplasm of digestive organs: Secondary | ICD-10-CM | POA: Diagnosis not present

## 2018-07-14 DIAGNOSIS — C7A8 Other malignant neuroendocrine tumors: Secondary | ICD-10-CM | POA: Diagnosis present

## 2018-07-14 DIAGNOSIS — Z809 Family history of malignant neoplasm, unspecified: Secondary | ICD-10-CM | POA: Diagnosis not present

## 2018-07-14 DIAGNOSIS — Z8041 Family history of malignant neoplasm of ovary: Secondary | ICD-10-CM | POA: Diagnosis not present

## 2018-07-14 DIAGNOSIS — C3402 Malignant neoplasm of left main bronchus: Secondary | ICD-10-CM

## 2018-07-14 DIAGNOSIS — Z803 Family history of malignant neoplasm of breast: Secondary | ICD-10-CM | POA: Insufficient documentation

## 2018-07-14 DIAGNOSIS — Z5111 Encounter for antineoplastic chemotherapy: Secondary | ICD-10-CM | POA: Diagnosis present

## 2018-07-14 DIAGNOSIS — Z510 Encounter for antineoplastic radiation therapy: Secondary | ICD-10-CM | POA: Diagnosis not present

## 2018-07-14 DIAGNOSIS — Z87891 Personal history of nicotine dependence: Secondary | ICD-10-CM | POA: Diagnosis not present

## 2018-07-14 LAB — COMPREHENSIVE METABOLIC PANEL
ALK PHOS: 69 U/L (ref 38–126)
ALT: 17 U/L (ref 0–44)
AST: 17 U/L (ref 15–41)
Albumin: 3.7 g/dL (ref 3.5–5.0)
Anion gap: 7 (ref 5–15)
BUN: 20 mg/dL (ref 8–23)
CALCIUM: 9.1 mg/dL (ref 8.9–10.3)
CHLORIDE: 102 mmol/L (ref 98–111)
CO2: 26 mmol/L (ref 22–32)
Creatinine, Ser: 0.79 mg/dL (ref 0.44–1.00)
GFR calc Af Amer: >60 mL/min (ref >60)
GFR calc non Af Amer: >60 mL/min (ref >60)
Glucose, Bld: 106 mg/dL — ABNORMAL HIGH (ref 70–99)
Potassium: 4.2 mmol/L (ref 3.5–5.1)
Sodium: 135 mmol/L (ref 135–145)
Total Bilirubin: 0.5 mg/dL (ref 0.3–1.2)
Total Protein: 6.8 g/dL (ref 6.5–8.1)

## 2018-07-14 LAB — CBC WITH DIFFERENTIAL/PLATELET
Abs Immature Granulocytes: 0.13 10*3/uL — ABNORMAL HIGH (ref 0.00–0.07)
Basophils Absolute: 0 10*3/uL (ref 0.0–0.1)
Basophils Relative: 1 %
Eosinophils Absolute: 0.1 10*3/uL (ref 0.0–0.5)
Eosinophils Relative: 2 %
HCT: 37.5 % (ref 36.0–46.0)
Hemoglobin: 12.5 g/dL (ref 12.0–15.0)
Immature Granulocytes: 5 %
Lymphocytes Relative: 39 %
Lymphs Abs: 1.1 10*3/uL (ref 0.7–4.0)
MCH: 30.6 pg (ref 26.0–34.0)
MCHC: 33.3 g/dL (ref 30.0–36.0)
MCV: 91.9 fL (ref 80.0–100.0)
Monocytes Absolute: 0.8 10*3/uL (ref 0.1–1.0)
Monocytes Relative: 27 %
NEUTROS ABS: 0.7 10*3/uL — AB (ref 1.7–7.7)
Neutrophils Relative %: 26 %
Platelets: 253 10*3/uL (ref 150–400)
RBC: 4.08 MIL/uL (ref 3.87–5.11)
RDW: 13.7 % (ref 11.5–15.5)
WBC: 2.8 10*3/uL — ABNORMAL LOW (ref 4.0–10.5)
nRBC: 0 % (ref 0.0–0.2)

## 2018-07-14 MED ORDER — SODIUM CHLORIDE 0.9% FLUSH
10.0000 mL | INTRAVENOUS | Status: DC | PRN
  Administered 2018-07-14: 10 mL via INTRAVENOUS
  Filled 2018-07-14: qty 10

## 2018-07-14 MED ORDER — HEPARIN SOD (PORK) LOCK FLUSH 100 UNIT/ML IV SOLN
500.0000 [IU] | Freq: Once | INTRAVENOUS | Status: AC
  Administered 2018-07-14: 500 [IU] via INTRAVENOUS

## 2018-07-14 NOTE — Assessment & Plan Note (Addendum)
#  Left hilar- large cell neuroendocrine; limited stage.  TxN2-Stage III; currently on carbo-Etop q 3 w- with concurrent RT.   #Hold cycle #2 of carbo etoposide because of ANC 0.7.  We will plan chemotherapy in 1 week.  Discussed neutropenic precautions.  #COPD-stable continue inhalers.  # DISPOSITION: # HOLD chemo today-low ANC; de-acess # 1 week- labs- cbc/  Carbo-Etop days-1; Etop 2&3- # follow up in 4 weeks/MD-X- labs- cbc/cmp; Carbo-Etop days-1; Etop 2&3-Dr.B

## 2018-07-14 NOTE — Progress Notes (Signed)
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 NOV 15th CT NED  # DEC-JAN 2020-CT/PET-Left hilar/mediastinal shift large cell neuroendocrine; Limited stage.TxN2-Stage III  # 18th Feb 2020- carbo-Etop with RT  # COPD on 02 at night; hx of smoking.  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)    Initial Diagnosis    Malignant neoplasm of hilus of left lung ([place])    06/23/2018 -  Chemotherapy    The patient had palonosetron (ALOXI) injection 0.25 mg, 0.25 mg, Intravenous,  Once, 1 of 4 cycles Administration: 0.25 mg (06/23/2018) CARBOplatin (PARAPLATIN) 410 mg in sodium chloride 0.9 % 250 mL chemo infusion, 410 mg (100 % of original dose 412 mg), Intravenous,  Once, 1 of 4 cycles Dose modification:   (original dose 412 mg, Cycle 1) Administration: 410 mg (06/23/2018) etoposide (VEPESID) 220 mg in sodium chloride 0.9 % 1,000 mL chemo infusion, 100 mg/m2 = 220 mg, Intravenous,  Once, 1 of 4 cycles Administration: 220 mg (06/23/2018), 220 mg (06/24/2018), 220 mg (06/25/2018)  for chemotherapy treatment.      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 NOV 15th CT NED  # DEC-JAN 2020-CT/PET-Left hilar/mediastinal shift large cell neuroendocrine; Limited stage.TxN2-Stage III  # 18th Feb 2020- carbo-Etop with RT  # COPD on 02 at night; hx of smoking.  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)    Initial Diagnosis    Malignant neoplasm of hilus of left lung ([place])    06/23/2018 -  Chemotherapy    The patient had palonosetron (ALOXI) injection 0.25 mg, 0.25 mg, Intravenous,  Once, 1 of 4 cycles Administration: 0.25 mg (06/23/2018) CARBOplatin (PARAPLATIN) 410 mg in sodium chloride 0.9 % 250 mL chemo infusion, 410 mg (100 % of original dose 412 mg), Intravenous,  Once, 1 of 4 cycles Dose modification:   (original dose 412 mg, Cycle 1) Administration: 410 mg (06/23/2018) etoposide (VEPESID) 220 mg in sodium chloride 0.9 % 1,000 mL chemo infusion, 100 mg/m2 = 220 mg, Intravenous,  Once, 1 of 4 cycles Administration: 220 mg (06/23/2018), 220 mg (06/24/2018), 220 mg (06/25/2018)  for chemotherapy treatment.      Cancer of  lower lobe of right lung [facility])     INTERVAL HISTORY:  Cathy Watts 73 y.o.  female pleasant patient above history of left hilar large cell neuroendocrine-/limited stage-currently on concurrent chemoradiation is here for follow-up.  Patient denies any significant nausea vomiting after first cycle of chemotherapy.  Complains of hair loss.  Complains of mild fatigue.  No skin rash.  No fever no chills.  Review of Systems  Constitutional: Positive for  malaise/fatigue. Negative for chills, diaphoresis, fever and weight loss.  HENT: Negative for nosebleeds and sore throat.   Eyes: Negative for double vision.  Respiratory: Positive for cough and shortness of breath. Negative for hemoptysis, sputum production and wheezing.   Cardiovascular: Negative for chest pain, palpitations, orthopnea and leg swelling.  Gastrointestinal: Negative for abdominal pain, blood in stool, constipation, diarrhea, heartburn, melena, nausea and vomiting.  Genitourinary: Negative for dysuria, frequency and urgency.  Musculoskeletal: Negative for back pain and joint pain.  Skin: Negative.  Negative for itching and rash.  Neurological: Negative for dizziness, tingling, focal weakness, weakness and headaches.  Endo/Heme/Allergies: Does not bruise/bleed easily.  Psychiatric/Behavioral: Negative for depression. The patient is not nervous/anxious and does not have insomnia.       PAST MEDICAL HISTORY :  Past Medical History:  Diagnosis Date  . Abdominal aortic aneurysm (AAA) 3.0 cm to 5.0 cm in diameter in female [facility]) 05/2018   being followed by dr. Delana Meyer. will reultrasound in 1 year  . Anxiety   . Asthma   . Barrett's esophagus   . COPD (chronic obstructive pulmonary disease) ([place])   . Depression   . GERD (gastroesophageal reflux disease)   . History of peptic ulcer disease   . Hypercholesterolemia   . Low oxygen saturation    2l hs  . Osteopenia   . Panic disorder   . Personal history of radiation therapy   . Personal history of tobacco use, presenting hazards to health 01/16/2015  . Requires supplemental oxygen 05/2018   supposed to use 2L NP at night but she currently does not.  encouraged patient to resume this  . Sleep apnea    snores significantly but has never been tested for OSA  . SOB (shortness of breath)   . 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  CERVICAL CONE BIOPSY  1990  . CHOLECYSTECTOMY  1980's  . colonoscopy  2014  . COLONOSCOPY  07/21/2012  . COLONOSCOPY WITH PROPOFOL N/A 05/12/2018   Procedure: COLONOSCOPY WITH PROPOFOL;  Surgeon: Lollie Sails, MD;  Location: [facility] ENDOSCOPY;  Service: Endoscopy;  Laterality: N/A;  . ELBOW FRACTURE SURGERY Right 2009  . ENDOBRONCHIAL ULTRASOUND N/A 02/07/2015   Procedure: ENDOBRONCHIAL ULTRASOUND;  Surgeon: Flora Lipps, MD;  Location: [facility] ORS;  Service: Cardiopulmonary;  Laterality: N/A;  . ENDOBRONCHIAL ULTRASOUND Left 06/05/2018   Procedure: ENDOBRONCHIAL ULTRASOUND;  Surgeon: Flora Lipps, MD;  Location: [facility] ORS;  Service: Cardiopulmonary;  Laterality: Left;  . ESOPHAGOGASTRODUODENOSCOPY (EGD) WITH PROPOFOL N/A 02/26/2016   Procedure: ESOPHAGOGASTRODUODENOSCOPY (EGD) WITH PROPOFOL;  Surgeon: Lollie Sails, MD;  Location: [facility] ENDOSCOPY;  Service: Endoscopy;  Laterality: N/A;  COPD, Sleep Apnea  . ESOPHAGOGASTRODUODENOSCOPY (EGD) WITH PROPOFOL N/A 05/12/2018   Procedure: ESOPHAGOGASTRODUODENOSCOPY (EGD) WITH PROPOFOL;  Surgeon: Lollie Sails, MD;  Location: [facility] ENDOSCOPY;  Service: Endoscopy;  Laterality: N/A;  . EYE SURGERY Bilateral    cataract extractions  . FRACTURE SURGERY  2005   elbow repair  . PORTA CATH INSERTION N/A 06/17/2018   Procedure: PORTA CATH INSERTION;  Surgeon: Katha Cabal, MD;  Location: [place] CV LAB;  Service: Cardiovascular;  Laterality: N/A;  . TONSILLECTOMY  1979  . TUBAL LIGATION  1970's  . UPPER GI ENDOSCOPY      FAMILY HISTORY :   Family History  Problem Relation Age of Onset  . Colon cancer Mother        colon  . Diabetes Mother   . Arthritis Mother   . Glaucoma Mother   . Stomach cancer Father        stomach  . Throat cancer Brother        throat  . Breast cancer Sister        breast  . Diabetes Sister   . Cirrhosis Sister   . Cancer Paternal Aunt   . Parkinson's disease Sister   . Heart attack Sister   . Ovarian  cancer Sister     SOCIAL HISTORY:   Social History   Tobacco Use  . Smoking status: Former Smoker    Packs/day: 1.00    Years: 55.00    Pack years: 55.00    Types: Cigarettes    Last attempt to quit: 11/02/2017    Years since quitting: 0.6  . Smokeless tobacco: Never Used  . Tobacco comment: used to use E-cig  Substance Use Topics  . Alcohol use: Yes    Alcohol/week: 0.0 standard drinks    Comment: occasionally- wine  . Drug use: No    ALLERGIES:  is allergic to amoxicillin.  MEDICATIONS:  Current Outpatient Medications  Medication Sig Dispense Refill  . albuterol (PROAIR HFA) 108 (90 Base) MCG/ACT inhaler Inhale 2 puffs into the lungs every 4 (four) hours as needed for wheezing or shortness of breath. 1 each 2  . buPROPion (WELLBUTRIN XL) 150 MG 24 hr tablet Take 1 tablet (150 mg total) by mouth daily. 90 tablet 1  . busPIRone (BUSPAR) 30 MG tablet TAKE 1 TABLET BY MOUTH TWICE DAILY (Patient taking differently: Take 30 mg by mouth 2 (two) times daily. ) 60 tablet 11  . CALCIUM CARBONATE-VIT D-MIN PO Take 1 tablet by mouth 2 (two) times daily.     . clotrimazole-betamethasone (LOTRISONE) cream 1 (one) Cream Cream Apply twice daily to affected area.  Not for Internal use. (Patient taking differently: Apply 1 application topically daily as needed (rash). 1 (one) Cream Cream Apply twice daily to affected area.  Not for Internal use.) 45 g 1  . escitalopram (LEXAPRO) 20 MG tablet TAKE 1 TABLET BY MOUTH ONCE DAILY (Patient taking differently: Take 20 mg by mouth daily. ) 90 tablet 1  . Fluticasone-Umeclidin-Vilant (TRELEGY ELLIPTA) 100-62.5-25 MCG/INH AEPB Inhale 1 puff into the lungs daily. 180 each 3  . lidocaine-prilocaine (EMLA) cream Apply 1 application topically as needed. 30 g 0  . Magnesium 250 MG TABS Take 250 mg by mouth daily.    . Omega-3 Fatty Acids (FISH OIL PO) Take 800 mg by mouth 2 (two) times daily.     Marland Kitchen omeprazole (PRILOSEC) 40 MG capsule Take 1 capsule (40 mg  total) by mouth daily. 90 capsule 1  . ondansetron (ZOFRAN) 8 MG tablet Take 1 tablet (8 mg total) by mouth 3 (three) times daily as needed for nausea or vomiting. 30 tablet 2  . polyethylene glycol powder (GLYCOLAX/MIRALAX) powder TAKE 17 GRAMS OF POWDER MIXED IN 8 OUNCES OF WATER BY MOUTH ONCE  A DAY. (Patient taking differently: Take 1 Container by mouth daily. TAKE 17 GRAMS OF POWDER MIXED IN 8 OUNCES OF WATER BY MOUTH ONCE A DAY.) 255 g 3  . pravastatin (PRAVACHOL) 20 MG tablet TAKE 1 TABLET BY MOUTH AT BEDTIME (Patient taking differently: Take 20 mg by mouth at bedtime. ) 90 tablet 1  . prochlorperazine (COMPAZINE) 10 MG tablet Take 1 tablet (10 mg total) by mouth every 6 (six) hours as needed for nausea or vomiting. 30 tablet 2  . Respiratory Therapy Supplies (FLUTTER) DEVI 1 each by Does not apply route QID. 1 each 0  . rOPINIRole (REQUIP) 3 MG tablet TAKE 1 TABLET BY MOUTH AT BEDTIME (Patient taking differently: Take 3 mg by mouth at bedtime. ) 90 tablet 1   No current facility-administered medications for this visit.    Facility-Administered Medications Ordered in Other Visits  Medication Dose Route Frequency Provider Last Rate Last Dose  . sodium chloride flush (NS) 0.9 % injection 10 mL  10 mL Intravenous PRN Cammie Sickle, MD   10 mL at 07/14/18 0908    PHYSICAL EXAMINATION: ECOG PERFORMANCE STATUS: 1 - Symptomatic but completely ambulatory  BP (!) 151/92 (BP Location: Left Arm, Patient Position: Sitting, Cuff Size: Normal)   Pulse 89   Temp 97.6 F (36.4 C) (Tympanic)   Resp 16   Wt 220 lb (99.8 kg)   BMI 31.57 kg/m   Filed Weights   07/14/18 0932  Weight: 220 lb (99.8 kg)   Physical Exam  Constitutional: She is oriented to person, place, and time and well-developed, well-nourished, and in no distress.  She is alone.  Not on oxygen.  She is walking herself.  HENT:  Head: Normocephalic and atraumatic.  Mouth/Throat: Oropharynx is clear and moist. No  oropharyngeal exudate.  Eyes: Pupils are equal, round, and reactive to light.  Neck: Normal range of motion. Neck supple.  Cardiovascular: Normal rate and regular rhythm.  Pulmonary/Chest: No respiratory distress. She has no wheezes.  Decreased air entry bilaterally.  Abdominal: Soft. Bowel sounds are normal. She exhibits no distension and no mass. There is no abdominal tenderness. There is no rebound and no guarding.  Musculoskeletal: Normal range of motion.        General: No tenderness or edema.  Neurological: She is alert and oriented to person, place, and time.  Skin: Skin is warm.  Psychiatric: Affect normal.      LABORATORY DATA:  I have reviewed the data as listed    Component Value Date/Time   NA 135 07/14/2018 0900   NA 140 05/01/2018 1116   K 4.2 07/14/2018 0900   CL 102 07/14/2018 0900   CO2 26 07/14/2018 0900   GLUCOSE 106 (H) 07/14/2018 0900   BUN 20 07/14/2018 0900   BUN 15 05/01/2018 1116   CREATININE 0.79 07/14/2018 0900   CALCIUM 9.1 07/14/2018 0900   PROT 6.8 07/14/2018 0900   PROT 6.7 05/01/2018 1116   ALBUMIN 3.7 07/14/2018 0900   ALBUMIN 4.2 05/01/2018 1116   AST 17 07/14/2018 0900   ALT 17 07/14/2018 0900   ALKPHOS 69 07/14/2018 0900   BILITOT 0.5 07/14/2018 0900   BILITOT 0.3 05/01/2018 1116   GFRNONAA >60 07/14/2018 0900   GFRAA >60 07/14/2018 0900    No results found for: SPEP, UPEP  Lab Results  Component Value Date   WBC 2.8 (L) 07/14/2018   NEUTROABS 0.7 (L) 07/14/2018   HGB 12.5 07/14/2018   HCT 37.5 07/14/2018  MCV 91.9 07/14/2018   PLT 253 07/14/2018      Chemistry      Component Value Date/Time   NA 135 07/14/2018 0900   NA 140 05/01/2018 1116   K 4.2 07/14/2018 0900   CL 102 07/14/2018 0900   CO2 26 07/14/2018 0900   BUN 20 07/14/2018 0900   BUN 15 05/01/2018 1116   CREATININE 0.79 07/14/2018 0900      Component Value Date/Time   CALCIUM 9.1 07/14/2018 0900   ALKPHOS 69 07/14/2018 0900   AST 17 07/14/2018 0900    ALT 17 07/14/2018 0900   BILITOT 0.5 07/14/2018 0900   BILITOT 0.3 05/01/2018 1116       RADIOGRAPHIC STUDIES: I have personally reviewed the radiological images as listed and agreed with the findings in the report. No results found.   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  TxN2-Stage III; currently on carbo-Etop q 3 w- with concurrent RT.   #Hold cycle #2 of carbo etoposide because of ANC 0.7.  We will plan chemotherapy in 1 week.  Discussed neutropenic precautions.  #COPD-stable continue inhalers.  # DISPOSITION: # HOLD chemo today-low ANC; de-acess # 1 week- labs- cbc/  Carbo-Etop days-1; Etop 2&3- # follow up in 4 weeks/MD-X- labs- cbc/cmp; Carbo-Etop days-1; Etop 2&3-Dr.B   No orders of the defined types were placed in this encounter.  All questions were answered. The patient knows to call the clinic with any problems, questions or concerns.      Cammie Sickle, MD 07/14/2018 9:50 AM

## 2018-07-15 ENCOUNTER — Ambulatory Visit
Admission: RE | Admit: 2018-07-15 | Discharge: 2018-07-15 | Disposition: A | Discharge status: Home / Self Care | Payer: Medicare Other | Source: Ambulatory Visit | Attending: Radiation Oncology | Admitting: Radiation Oncology

## 2018-07-15 DIAGNOSIS — Z510 Encounter for antineoplastic radiation therapy: Secondary | ICD-10-CM | POA: Diagnosis not present

## 2018-07-16 ENCOUNTER — Other Ambulatory Visit: Payer: Self-pay

## 2018-07-16 ENCOUNTER — Ambulatory Visit
Admission: RE | Admit: 2018-07-16 | Discharge: 2018-07-16 | Disposition: A | Discharge status: Home / Self Care | Payer: Medicare Other | Source: Ambulatory Visit | Attending: Radiation Oncology | Admitting: Radiation Oncology

## 2018-07-16 DIAGNOSIS — Z510 Encounter for antineoplastic radiation therapy: Secondary | ICD-10-CM | POA: Diagnosis not present

## 2018-07-17 ENCOUNTER — Other Ambulatory Visit: Payer: Self-pay

## 2018-07-17 ENCOUNTER — Ambulatory Visit
Admission: RE | Admit: 2018-07-17 | Discharge: 2018-07-17 | Disposition: A | Discharge status: Home / Self Care | Payer: Medicare Other | Source: Ambulatory Visit | Attending: Radiation Oncology | Admitting: Radiation Oncology

## 2018-07-17 DIAGNOSIS — Z510 Encounter for antineoplastic radiation therapy: Secondary | ICD-10-CM | POA: Diagnosis not present

## 2018-07-20 ENCOUNTER — Other Ambulatory Visit: Payer: Self-pay

## 2018-07-20 ENCOUNTER — Ambulatory Visit
Admission: RE | Admit: 2018-07-20 | Discharge: 2018-07-20 | Disposition: A | Discharge status: Home / Self Care | Payer: Medicare Other | Source: Ambulatory Visit | Attending: Radiation Oncology | Admitting: Radiation Oncology

## 2018-07-20 DIAGNOSIS — Z510 Encounter for antineoplastic radiation therapy: Secondary | ICD-10-CM | POA: Diagnosis not present

## 2018-07-21 ENCOUNTER — Inpatient Hospital Stay: Payer: Medicare Other | Admitting: *Deleted

## 2018-07-21 ENCOUNTER — Other Ambulatory Visit: Payer: Self-pay

## 2018-07-21 ENCOUNTER — Ambulatory Visit
Admission: RE | Admit: 2018-07-21 | Discharge: 2018-07-21 | Disposition: A | Discharge status: Home / Self Care | Payer: Medicare Other | Source: Ambulatory Visit | Attending: Radiation Oncology | Admitting: Radiation Oncology

## 2018-07-21 ENCOUNTER — Inpatient Hospital Stay: Payer: Medicare Other

## 2018-07-21 VITALS — BP 144/92 | HR 85 | Temp 96.9°F | Resp 17 | Wt 221.6 lb

## 2018-07-21 DIAGNOSIS — C3402 Malignant neoplasm of left main bronchus: Secondary | ICD-10-CM

## 2018-07-21 DIAGNOSIS — Z95828 Presence of other vascular implants and grafts: Secondary | ICD-10-CM

## 2018-07-21 DIAGNOSIS — Z510 Encounter for antineoplastic radiation therapy: Secondary | ICD-10-CM | POA: Diagnosis not present

## 2018-07-21 DIAGNOSIS — Z5111 Encounter for antineoplastic chemotherapy: Secondary | ICD-10-CM | POA: Diagnosis not present

## 2018-07-21 LAB — CBC WITH DIFFERENTIAL/PLATELET
Abs Immature Granulocytes: 0.11 10*3/uL — ABNORMAL HIGH (ref 0.00–0.07)
Basophils Absolute: 0.1 10*3/uL (ref 0.0–0.1)
Basophils Relative: 1 %
Eosinophils Absolute: 0.1 10*3/uL (ref 0.0–0.5)
Eosinophils Relative: 2 %
HCT: 38.3 % (ref 36.0–46.0)
Hemoglobin: 12.2 g/dL (ref 12.0–15.0)
Immature Granulocytes: 2 %
Lymphocytes Relative: 19 %
Lymphs Abs: 1.1 10*3/uL (ref 0.7–4.0)
MCH: 29.3 pg (ref 26.0–34.0)
MCHC: 31.9 g/dL (ref 30.0–36.0)
MCV: 92.1 fL (ref 80.0–100.0)
Monocytes Absolute: 1 10*3/uL (ref 0.1–1.0)
Monocytes Relative: 18 %
NEUTROS ABS: 3.3 10*3/uL (ref 1.7–7.7)
Neutrophils Relative %: 58 %
Platelets: 225 10*3/uL (ref 150–400)
RBC: 4.16 MIL/uL (ref 3.87–5.11)
RDW: 13.9 % (ref 11.5–15.5)
WBC: 5.7 10*3/uL (ref 4.0–10.5)
nRBC: 0 % (ref 0.0–0.2)

## 2018-07-21 LAB — COMPREHENSIVE METABOLIC PANEL
ALT: 16 U/L (ref 0–44)
AST: 17 U/L (ref 15–41)
Albumin: 3.6 g/dL (ref 3.5–5.0)
Alkaline Phosphatase: 72 U/L (ref 38–126)
Anion gap: 6 (ref 5–15)
BUN: 13 mg/dL (ref 8–23)
CO2: 26 mmol/L (ref 22–32)
Calcium: 8.8 mg/dL — ABNORMAL LOW (ref 8.9–10.3)
Chloride: 103 mmol/L (ref 98–111)
Creatinine, Ser: 0.76 mg/dL (ref 0.44–1.00)
GFR calc non Af Amer: >60 mL/min (ref >60)
Glucose, Bld: 113 mg/dL — ABNORMAL HIGH (ref 70–99)
Potassium: 4.1 mmol/L (ref 3.5–5.1)
SODIUM: 135 mmol/L (ref 135–145)
Total Bilirubin: 0.4 mg/dL (ref 0.3–1.2)
Total Protein: 6.6 g/dL (ref 6.5–8.1)

## 2018-07-21 MED ORDER — SODIUM CHLORIDE 0.9% FLUSH
10.0000 mL | Freq: Once | INTRAVENOUS | Status: AC
  Administered 2018-07-21: 10 mL via INTRAVENOUS
  Filled 2018-07-21: qty 10

## 2018-07-21 MED ORDER — SODIUM CHLORIDE 0.9 % IV SOLN
100.0000 mg/m2 | Freq: Once | INTRAVENOUS | Status: AC
  Administered 2018-07-21: 220 mg via INTRAVENOUS
  Filled 2018-07-21: qty 11

## 2018-07-21 MED ORDER — PALONOSETRON HCL INJECTION 0.25 MG/5ML
0.2500 mg | Freq: Once | INTRAVENOUS | Status: AC
  Administered 2018-07-21: 0.25 mg via INTRAVENOUS
  Filled 2018-07-21: qty 5

## 2018-07-21 MED ORDER — SODIUM CHLORIDE 0.9 % IV SOLN
412.0000 mg | Freq: Once | INTRAVENOUS | Status: AC
  Administered 2018-07-21: 410 mg via INTRAVENOUS
  Filled 2018-07-21: qty 41

## 2018-07-21 MED ORDER — HEPARIN SOD (PORK) LOCK FLUSH 100 UNIT/ML IV SOLN
500.0000 [IU] | Freq: Once | INTRAVENOUS | Status: AC | PRN
  Administered 2018-07-21: 500 [IU]
  Filled 2018-07-21: qty 5

## 2018-07-21 MED ORDER — DEXAMETHASONE SODIUM PHOSPHATE 10 MG/ML IJ SOLN
10.0000 mg | Freq: Once | INTRAMUSCULAR | Status: AC
  Administered 2018-07-21: 10 mg via INTRAVENOUS
  Filled 2018-07-21: qty 1

## 2018-07-21 MED ORDER — SODIUM CHLORIDE 0.9 % IV SOLN
Freq: Once | INTRAVENOUS | Status: AC
  Administered 2018-07-21: 09:00:00 via INTRAVENOUS
  Filled 2018-07-21: qty 250

## 2018-07-22 ENCOUNTER — Other Ambulatory Visit: Payer: Self-pay

## 2018-07-22 ENCOUNTER — Inpatient Hospital Stay: Payer: Medicare Other

## 2018-07-22 ENCOUNTER — Ambulatory Visit
Admission: RE | Admit: 2018-07-22 | Discharge: 2018-07-22 | Disposition: A | Discharge status: Home / Self Care | Payer: Medicare Other | Source: Ambulatory Visit | Attending: Radiation Oncology | Admitting: Radiation Oncology

## 2018-07-22 VITALS — BP 162/90 | HR 90 | Temp 96.6°F | Resp 18 | Wt 224.0 lb

## 2018-07-22 DIAGNOSIS — Z510 Encounter for antineoplastic radiation therapy: Secondary | ICD-10-CM | POA: Diagnosis not present

## 2018-07-22 DIAGNOSIS — C3402 Malignant neoplasm of left main bronchus: Secondary | ICD-10-CM

## 2018-07-22 DIAGNOSIS — Z5111 Encounter for antineoplastic chemotherapy: Secondary | ICD-10-CM | POA: Diagnosis not present

## 2018-07-22 MED ORDER — HEPARIN SOD (PORK) LOCK FLUSH 100 UNIT/ML IV SOLN
500.0000 [IU] | Freq: Once | INTRAVENOUS | Status: AC | PRN
  Administered 2018-07-22: 500 [IU]
  Filled 2018-07-22: qty 5

## 2018-07-22 MED ORDER — DEXAMETHASONE SODIUM PHOSPHATE 10 MG/ML IJ SOLN
10.0000 mg | Freq: Once | INTRAMUSCULAR | Status: AC
  Administered 2018-07-22: 10 mg via INTRAVENOUS
  Filled 2018-07-22: qty 1

## 2018-07-22 MED ORDER — SODIUM CHLORIDE 0.9 % IV SOLN
Freq: Once | INTRAVENOUS | Status: AC
  Administered 2018-07-22: 11:00:00 via INTRAVENOUS
  Filled 2018-07-22: qty 250

## 2018-07-22 MED ORDER — SODIUM CHLORIDE 0.9 % IV SOLN
100.0000 mg/m2 | Freq: Once | INTRAVENOUS | Status: AC
  Administered 2018-07-22: 220 mg via INTRAVENOUS
  Filled 2018-07-22: qty 11

## 2018-07-23 ENCOUNTER — Inpatient Hospital Stay: Payer: Medicare Other

## 2018-07-23 ENCOUNTER — Ambulatory Visit
Admission: RE | Admit: 2018-07-23 | Discharge: 2018-07-23 | Disposition: A | Discharge status: Home / Self Care | Payer: Medicare Other | Source: Ambulatory Visit | Attending: Radiation Oncology | Admitting: Radiation Oncology

## 2018-07-23 ENCOUNTER — Other Ambulatory Visit: Payer: Self-pay

## 2018-07-23 VITALS — BP 150/84 | HR 90 | Temp 96.3°F | Resp 18

## 2018-07-23 DIAGNOSIS — C3402 Malignant neoplasm of left main bronchus: Secondary | ICD-10-CM

## 2018-07-23 DIAGNOSIS — Z510 Encounter for antineoplastic radiation therapy: Secondary | ICD-10-CM | POA: Diagnosis not present

## 2018-07-23 DIAGNOSIS — Z5111 Encounter for antineoplastic chemotherapy: Secondary | ICD-10-CM | POA: Diagnosis not present

## 2018-07-23 MED ORDER — HEPARIN SOD (PORK) LOCK FLUSH 100 UNIT/ML IV SOLN
500.0000 [IU] | Freq: Once | INTRAVENOUS | Status: AC | PRN
  Administered 2018-07-23: 500 [IU]
  Filled 2018-07-23: qty 5

## 2018-07-23 MED ORDER — SODIUM CHLORIDE 0.9 % IV SOLN
100.0000 mg/m2 | Freq: Once | INTRAVENOUS | Status: AC
  Administered 2018-07-23: 220 mg via INTRAVENOUS
  Filled 2018-07-23: qty 11

## 2018-07-23 MED ORDER — DEXAMETHASONE SODIUM PHOSPHATE 10 MG/ML IJ SOLN
10.0000 mg | Freq: Once | INTRAMUSCULAR | Status: AC
  Administered 2018-07-23: 10 mg via INTRAVENOUS
  Filled 2018-07-23: qty 1

## 2018-07-23 MED ORDER — SODIUM CHLORIDE 0.9 % IV SOLN
Freq: Once | INTRAVENOUS | Status: AC
  Administered 2018-07-23: 11:00:00 via INTRAVENOUS
  Filled 2018-07-23: qty 250

## 2018-07-24 ENCOUNTER — Ambulatory Visit
Admission: RE | Admit: 2018-07-24 | Discharge: 2018-07-24 | Disposition: A | Discharge status: Home / Self Care | Payer: Medicare Other | Source: Ambulatory Visit | Attending: Radiation Oncology | Admitting: Radiation Oncology

## 2018-07-24 ENCOUNTER — Other Ambulatory Visit: Payer: Self-pay

## 2018-07-24 DIAGNOSIS — Z510 Encounter for antineoplastic radiation therapy: Secondary | ICD-10-CM | POA: Diagnosis not present

## 2018-07-26 ENCOUNTER — Other Ambulatory Visit: Payer: Self-pay

## 2018-07-27 ENCOUNTER — Ambulatory Visit
Admission: RE | Admit: 2018-07-27 | Discharge: 2018-07-27 | Disposition: A | Discharge status: Home / Self Care | Payer: Medicare Other | Source: Ambulatory Visit | Attending: Radiation Oncology | Admitting: Radiation Oncology

## 2018-07-27 ENCOUNTER — Other Ambulatory Visit: Payer: Self-pay | Admitting: *Deleted

## 2018-07-27 ENCOUNTER — Other Ambulatory Visit: Payer: Self-pay

## 2018-07-27 DIAGNOSIS — Z510 Encounter for antineoplastic radiation therapy: Secondary | ICD-10-CM | POA: Diagnosis not present

## 2018-07-27 MED ORDER — SUCRALFATE 1 G PO TABS: 1.0000 g | ORAL_TABLET | Freq: Three times a day (TID) | ORAL | 3 refills | Status: DC

## 2018-07-28 ENCOUNTER — Other Ambulatory Visit: Payer: Self-pay

## 2018-07-28 ENCOUNTER — Ambulatory Visit
Admission: RE | Admit: 2018-07-28 | Discharge: 2018-07-28 | Disposition: A | Discharge status: Home / Self Care | Payer: Medicare Other | Source: Ambulatory Visit | Attending: Radiation Oncology | Admitting: Radiation Oncology

## 2018-07-28 DIAGNOSIS — Z510 Encounter for antineoplastic radiation therapy: Secondary | ICD-10-CM | POA: Diagnosis not present

## 2018-07-29 ENCOUNTER — Other Ambulatory Visit: Payer: Self-pay

## 2018-07-29 ENCOUNTER — Ambulatory Visit
Admission: RE | Admit: 2018-07-29 | Discharge: 2018-07-29 | Disposition: A | Discharge status: Home / Self Care | Payer: Medicare Other | Source: Ambulatory Visit | Attending: Radiation Oncology | Admitting: Radiation Oncology

## 2018-07-29 DIAGNOSIS — Z510 Encounter for antineoplastic radiation therapy: Secondary | ICD-10-CM | POA: Diagnosis not present

## 2018-07-30 ENCOUNTER — Other Ambulatory Visit: Payer: Self-pay

## 2018-07-30 ENCOUNTER — Ambulatory Visit
Admission: RE | Admit: 2018-07-30 | Discharge: 2018-07-30 | Disposition: A | Discharge status: Home / Self Care | Payer: Medicare Other | Source: Ambulatory Visit | Attending: Radiation Oncology | Admitting: Radiation Oncology

## 2018-07-30 DIAGNOSIS — Z510 Encounter for antineoplastic radiation therapy: Secondary | ICD-10-CM | POA: Diagnosis not present

## 2018-07-31 ENCOUNTER — Other Ambulatory Visit: Payer: Self-pay

## 2018-07-31 ENCOUNTER — Ambulatory Visit
Admission: RE | Admit: 2018-07-31 | Discharge: 2018-07-31 | Disposition: A | Discharge status: Home / Self Care | Payer: Medicare Other | Source: Ambulatory Visit | Attending: Radiation Oncology | Admitting: Radiation Oncology

## 2018-07-31 DIAGNOSIS — Z510 Encounter for antineoplastic radiation therapy: Secondary | ICD-10-CM | POA: Diagnosis not present

## 2018-08-03 ENCOUNTER — Other Ambulatory Visit: Payer: Self-pay

## 2018-08-03 ENCOUNTER — Ambulatory Visit
Admission: RE | Admit: 2018-08-03 | Discharge: 2018-08-03 | Disposition: A | Discharge status: Home / Self Care | Payer: Medicare Other | Source: Ambulatory Visit | Attending: Radiation Oncology | Admitting: Radiation Oncology

## 2018-08-03 DIAGNOSIS — Z510 Encounter for antineoplastic radiation therapy: Secondary | ICD-10-CM | POA: Diagnosis not present

## 2018-08-04 ENCOUNTER — Ambulatory Visit
Admission: RE | Admit: 2018-08-04 | Discharge: 2018-08-04 | Disposition: A | Discharge status: Home / Self Care | Payer: Medicare Other | Source: Ambulatory Visit | Attending: Radiation Oncology | Admitting: Radiation Oncology

## 2018-08-04 ENCOUNTER — Other Ambulatory Visit: Payer: Self-pay

## 2018-08-04 DIAGNOSIS — Z510 Encounter for antineoplastic radiation therapy: Secondary | ICD-10-CM | POA: Diagnosis not present

## 2018-08-05 ENCOUNTER — Ambulatory Visit
Admission: RE | Admit: 2018-08-05 | Discharge: 2018-08-05 | Disposition: A | Discharge status: Home / Self Care | Payer: Medicare Other | Source: Ambulatory Visit | Attending: Radiation Oncology | Admitting: Radiation Oncology

## 2018-08-05 ENCOUNTER — Other Ambulatory Visit: Payer: Self-pay

## 2018-08-05 DIAGNOSIS — C3402 Malignant neoplasm of left main bronchus: Secondary | ICD-10-CM | POA: Diagnosis not present

## 2018-08-05 DIAGNOSIS — C3431 Malignant neoplasm of lower lobe, right bronchus or lung: Secondary | ICD-10-CM | POA: Diagnosis not present

## 2018-08-05 DIAGNOSIS — Z510 Encounter for antineoplastic radiation therapy: Secondary | ICD-10-CM | POA: Insufficient documentation

## 2018-08-06 ENCOUNTER — Ambulatory Visit
Admission: RE | Admit: 2018-08-06 | Discharge: 2018-08-06 | Disposition: A | Discharge status: Home / Self Care | Payer: Medicare Other | Source: Ambulatory Visit | Attending: Radiation Oncology | Admitting: Radiation Oncology

## 2018-08-06 ENCOUNTER — Other Ambulatory Visit: Payer: Self-pay

## 2018-08-06 DIAGNOSIS — Z510 Encounter for antineoplastic radiation therapy: Secondary | ICD-10-CM | POA: Diagnosis not present

## 2018-08-07 ENCOUNTER — Ambulatory Visit
Admission: RE | Admit: 2018-08-07 | Discharge: 2018-08-07 | Disposition: A | Discharge status: Home / Self Care | Payer: Medicare Other | Source: Ambulatory Visit | Attending: Radiation Oncology | Admitting: Radiation Oncology

## 2018-08-07 ENCOUNTER — Other Ambulatory Visit: Payer: Self-pay

## 2018-08-07 DIAGNOSIS — Z510 Encounter for antineoplastic radiation therapy: Secondary | ICD-10-CM | POA: Diagnosis not present

## 2018-08-09 ENCOUNTER — Other Ambulatory Visit: Payer: Self-pay | Admitting: *Deleted

## 2018-08-09 DIAGNOSIS — C3402 Malignant neoplasm of left main bronchus: Secondary | ICD-10-CM

## 2018-08-10 ENCOUNTER — Ambulatory Visit
Admission: RE | Admit: 2018-08-10 | Discharge: 2018-08-10 | Disposition: A | Discharge status: Home / Self Care | Payer: Medicare Other | Source: Ambulatory Visit | Attending: Radiation Oncology | Admitting: Radiation Oncology

## 2018-08-10 ENCOUNTER — Other Ambulatory Visit: Payer: Self-pay

## 2018-08-10 DIAGNOSIS — Z510 Encounter for antineoplastic radiation therapy: Secondary | ICD-10-CM | POA: Diagnosis not present

## 2018-08-11 ENCOUNTER — Ambulatory Visit
Admission: RE | Admit: 2018-08-11 | Discharge: 2018-08-11 | Disposition: A | Discharge status: Home / Self Care | Payer: Medicare Other | Source: Ambulatory Visit | Attending: Radiation Oncology | Admitting: Radiation Oncology

## 2018-08-11 ENCOUNTER — Inpatient Hospital Stay (HOSPITAL_BASED_OUTPATIENT_CLINIC_OR_DEPARTMENT_OTHER): Payer: Medicare Other | Admitting: Oncology

## 2018-08-11 ENCOUNTER — Inpatient Hospital Stay: Payer: Medicare Other

## 2018-08-11 ENCOUNTER — Inpatient Hospital Stay: Payer: Medicare Other | Attending: Internal Medicine

## 2018-08-11 ENCOUNTER — Encounter: Payer: Self-pay | Admitting: Oncology

## 2018-08-11 ENCOUNTER — Other Ambulatory Visit: Payer: Self-pay

## 2018-08-11 VITALS — BP 126/83 | HR 62 | Temp 96.9°F | Resp 18 | Ht 70.0 in | Wt 229.0 lb

## 2018-08-11 DIAGNOSIS — T451X5A Adverse effect of antineoplastic and immunosuppressive drugs, initial encounter: Secondary | ICD-10-CM

## 2018-08-11 DIAGNOSIS — Z808 Family history of malignant neoplasm of other organs or systems: Secondary | ICD-10-CM | POA: Insufficient documentation

## 2018-08-11 DIAGNOSIS — Z510 Encounter for antineoplastic radiation therapy: Secondary | ICD-10-CM | POA: Diagnosis not present

## 2018-08-11 DIAGNOSIS — D701 Agranulocytosis secondary to cancer chemotherapy: Secondary | ICD-10-CM | POA: Insufficient documentation

## 2018-08-11 DIAGNOSIS — J449 Chronic obstructive pulmonary disease, unspecified: Secondary | ICD-10-CM | POA: Insufficient documentation

## 2018-08-11 DIAGNOSIS — Z8041 Family history of malignant neoplasm of ovary: Secondary | ICD-10-CM | POA: Diagnosis not present

## 2018-08-11 DIAGNOSIS — Z5111 Encounter for antineoplastic chemotherapy: Secondary | ICD-10-CM | POA: Diagnosis present

## 2018-08-11 DIAGNOSIS — C7A8 Other malignant neuroendocrine tumors: Secondary | ICD-10-CM | POA: Diagnosis present

## 2018-08-11 DIAGNOSIS — C3402 Malignant neoplasm of left main bronchus: Secondary | ICD-10-CM

## 2018-08-11 DIAGNOSIS — Z800 Family history of malignant neoplasm of digestive organs: Secondary | ICD-10-CM | POA: Diagnosis not present

## 2018-08-11 DIAGNOSIS — Z87891 Personal history of nicotine dependence: Secondary | ICD-10-CM | POA: Diagnosis not present

## 2018-08-11 DIAGNOSIS — Z803 Family history of malignant neoplasm of breast: Secondary | ICD-10-CM | POA: Insufficient documentation

## 2018-08-11 LAB — COMPREHENSIVE METABOLIC PANEL
ALT: 18 U/L (ref 0–44)
AST: 20 U/L (ref 15–41)
Albumin: 3.7 g/dL (ref 3.5–5.0)
Alkaline Phosphatase: 67 U/L (ref 38–126)
Anion gap: 8 (ref 5–15)
BUN: 15 mg/dL (ref 8–23)
CO2: 23 mmol/L (ref 22–32)
Calcium: 8.9 mg/dL (ref 8.9–10.3)
Chloride: 103 mmol/L (ref 98–111)
Creatinine, Ser: 0.76 mg/dL (ref 0.44–1.00)
GFR calc Af Amer: >60 mL/min (ref >60)
GFR calc non Af Amer: >60 mL/min (ref >60)
Glucose, Bld: 115 mg/dL — ABNORMAL HIGH (ref 70–99)
Potassium: 3.8 mmol/L (ref 3.5–5.1)
Sodium: 134 mmol/L — ABNORMAL LOW (ref 135–145)
Total Bilirubin: 0.3 mg/dL (ref 0.3–1.2)
Total Protein: 6.3 g/dL — ABNORMAL LOW (ref 6.5–8.1)

## 2018-08-11 LAB — CBC WITH DIFFERENTIAL/PLATELET
Abs Immature Granulocytes: 0.19 10*3/uL — ABNORMAL HIGH (ref 0.00–0.07)
Basophils Absolute: 0 10*3/uL (ref 0.0–0.1)
Basophils Relative: 1 %
Eosinophils Absolute: 0.1 10*3/uL (ref 0.0–0.5)
Eosinophils Relative: 2 %
HCT: 35.2 % — ABNORMAL LOW (ref 36.0–46.0)
Hemoglobin: 11.7 g/dL — ABNORMAL LOW (ref 12.0–15.0)
Immature Granulocytes: 6 %
Lymphocytes Relative: 23 %
Lymphs Abs: 0.7 10*3/uL (ref 0.7–4.0)
MCH: 30.7 pg (ref 26.0–34.0)
MCHC: 33.2 g/dL (ref 30.0–36.0)
MCV: 92.4 fL (ref 80.0–100.0)
Monocytes Absolute: 0.9 10*3/uL (ref 0.1–1.0)
Monocytes Relative: 31 %
Neutro Abs: 1.1 10*3/uL — ABNORMAL LOW (ref 1.7–7.7)
Neutrophils Relative %: 37 %
Platelets: 354 10*3/uL (ref 150–400)
RBC: 3.81 MIL/uL — ABNORMAL LOW (ref 3.87–5.11)
RDW: 14.8 % (ref 11.5–15.5)
Smear Review: ADEQUATE
WBC: 3 10*3/uL — ABNORMAL LOW (ref 4.0–10.5)
nRBC: 1 % — ABNORMAL HIGH (ref 0.0–0.2)

## 2018-08-11 MED ORDER — HEPARIN SOD (PORK) LOCK FLUSH 100 UNIT/ML IV SOLN
500.0000 [IU] | Freq: Once | INTRAVENOUS | Status: AC
  Administered 2018-08-11: 500 [IU] via INTRAVENOUS

## 2018-08-11 MED ORDER — SODIUM CHLORIDE 0.9% FLUSH
10.0000 mL | Freq: Once | INTRAVENOUS | Status: AC
  Administered 2018-08-11: 10 mL via INTRAVENOUS
  Filled 2018-08-11: qty 10

## 2018-08-11 NOTE — Progress Notes (Signed)
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 NOV 15th CT NED  # DEC-JAN 2020-CT/PET-Left hilar/mediastinal shift large cell neuroendocrine; Limited stage.TxN2-Stage III  # 18th Feb 2020- carbo-Etop with RT  # COPD on 02 at night; hx of smoking.  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)    Initial Diagnosis    Malignant neoplasm of hilus of left lung ([place])    06/23/2018 -  Chemotherapy    The patient had palonosetron (ALOXI) injection 0.25 mg, 0.25 mg, Intravenous,  Once, 2 of 4 cycles Administration: 0.25 mg (06/23/2018), 0.25 mg (07/21/2018) CARBOplatin (PARAPLATIN) 410 mg in sodium chloride 0.9 % 250 mL chemo infusion, 410 mg (100 % of original dose 412 mg), Intravenous,  Once, 2 of 4 cycles Dose modification:   (original dose 412 mg, Cycle 1) Administration: 410 mg (06/23/2018), 410 mg (07/21/2018) etoposide (VEPESID) 220 mg in sodium chloride 0.9 % 1,000 mL chemo infusion, 100 mg/m2 = 220 mg, Intravenous,  Once, 2 of 4 cycles Administration: 220 mg (06/23/2018), 220 mg (06/24/2018), 220  mg (06/25/2018), 220 mg (07/21/2018), 220 mg (07/22/2018), 220 mg (07/23/2018)  for chemotherapy treatment.      Cancer of lower lobe of right lung (HCC)     Interval history-overall she feels well and denies any symptoms of fever cough cold sore throat or runny nose.  She has mild fatigue but denies other symptoms such as nausea or vomiting  ECOG PS- 1 Pain scale- 0 Opioid associated constipation- no  Review of systems- Review of Systems  Constitutional: Positive for malaise/fatigue. Negative for chills, fever and weight loss.  HENT: Negative for congestion, ear discharge and nosebleeds.   Eyes: Negative for blurred vision.  Respiratory: Negative for cough, hemoptysis, sputum production, shortness of breath and wheezing.   Cardiovascular: Negative for chest pain, palpitations, orthopnea and claudication.  Gastrointestinal: Negative for abdominal pain, blood in stool, constipation, diarrhea, heartburn, melena, nausea and vomiting.  Genitourinary: Negative for dysuria, flank pain, frequency, hematuria and urgency.  Musculoskeletal: Negative for back pain, joint pain and myalgias.  Skin: Negative for rash.  Neurological: Negative for dizziness, tingling, focal weakness, seizures, weakness and headaches.  Endo/Heme/Allergies: Does not bruise/bleed easily.  Psychiatric/Behavioral: Negative for depression and suicidal ideas. The patient does not have insomnia.        Allergies  Allergen Reactions  . Amoxicillin Hives and Itching    Did it involve swelling of the face/tongue/throat, SOB, or low BP? No Did it involve sudden or severe rash/hives, skin peeling, or any reaction on the inside of your mouth or nose? No Did you need to seek medical attention at a hospital or  doctor's office? No When did it last happen?2018 If all above answers are "NO", may proceed with cephalosporin use.        Past Medical History:  Diagnosis Date  . Abdominal aortic aneurysm (AAA) 3.0 cm to 5.0  cm in diameter in female [facility]) 05/2018   being followed by dr. Delana Meyer. will reultrasound in 1 year  . Anxiety   . Asthma   . Barrett's esophagus   . COPD (chronic obstructive pulmonary disease) ([place])   . Depression   . GERD (gastroesophageal reflux disease)   . History of peptic ulcer disease   . Hypercholesterolemia   . Low oxygen saturation    2l hs  . Osteopenia   . Panic disorder   . Personal history of radiation therapy   . Personal history of tobacco use, presenting hazards to health 01/16/2015  . Requires supplemental oxygen 05/2018   supposed to use 2L NP at night but she currently does not.  encouraged patient to resume this  . Sleep apnea    snores significantly but has never been tested for OSA  . SOB (shortness of breath)   . Squamous cell carcinoma of right lung ([place]) 2015     Past Surgical History:  Procedure Laterality Date  . CERVICAL CONE BIOPSY  1990  . CHOLECYSTECTOMY  1980's  . colonoscopy  2014  . COLONOSCOPY  07/21/2012  . COLONOSCOPY WITH PROPOFOL N/A 05/12/2018   Procedure: COLONOSCOPY WITH PROPOFOL;  Surgeon: Lollie Sails, MD;  Location: [facility] ENDOSCOPY;  Service: Endoscopy;  Laterality: N/A;  . ELBOW FRACTURE SURGERY Right 2009  . ENDOBRONCHIAL ULTRASOUND N/A 02/07/2015   Procedure: ENDOBRONCHIAL ULTRASOUND;  Surgeon: Flora Lipps, MD;  Location: [facility] ORS;  Service: Cardiopulmonary;  Laterality: N/A;  . ENDOBRONCHIAL ULTRASOUND Left 06/05/2018   Procedure: ENDOBRONCHIAL ULTRASOUND;  Surgeon: Flora Lipps, MD;  Location: [facility] ORS;  Service: Cardiopulmonary;  Laterality: Left;  . ESOPHAGOGASTRODUODENOSCOPY (EGD) WITH PROPOFOL N/A 02/26/2016   Procedure: ESOPHAGOGASTRODUODENOSCOPY (EGD) WITH PROPOFOL;  Surgeon: Lollie Sails, MD;  Location: [facility] ENDOSCOPY;  Service: Endoscopy;  Laterality: N/A;  COPD, Sleep Apnea  . ESOPHAGOGASTRODUODENOSCOPY (EGD) WITH PROPOFOL N/A 05/12/2018   Procedure: ESOPHAGOGASTRODUODENOSCOPY (EGD) WITH PROPOFOL;  Surgeon:  Lollie Sails, MD;  Location: [facility] ENDOSCOPY;  Service: Endoscopy;  Laterality: N/A;  . EYE SURGERY Bilateral    cataract extractions  . FRACTURE SURGERY  2005   elbow repair  . PORTA CATH INSERTION N/A 06/17/2018   Procedure: PORTA CATH INSERTION;  Surgeon: Katha Cabal, MD;  Location: [place] CV LAB;  Service: Cardiovascular;  Laterality: N/A;  . TONSILLECTOMY  1979  . TUBAL LIGATION  1970's  . UPPER GI ENDOSCOPY      Social History   Socioeconomic History  . Marital status: Widowed    Spouse name: Not on file  . Number of children: 3  . Years of education: Not on file  . Highest education level: 12th grade  Occupational History  . 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 Financial resource strain: Not hard at all  . Food insecurity:    Worry: Never true    Inability: Never true  . Transportation needs:    Medical: No    Non-medical: No  Tobacco Use  . Smoking status: Former Smoker    Packs/day: 1.00    Years: 55.00    Pack years: 55.00    Types: Cigarettes    Last attempt to quit: 11/02/2017  Years since quitting: 0.7  . Smokeless tobacco: Never Used  . Tobacco comment: used to use E-cig  Substance and Sexual Activity  . Alcohol use: Yes    Alcohol/week: 0.0 standard drinks    Comment: occasionally- wine  . Drug use: No  . Sexual activity: Not Currently  Lifestyle  . Physical activity:    Days per week: Not on file    Minutes per session: Not on file  . Stress: Not at all  Relationships  . Social connections:    Talks on phone: Not on file    Gets together: Not on file    Attends religious service: Not on file    Active member of club or organization: Not on file    Attends meetings of clubs or organizations: Not on file    Relationship status: Not on file  . Intimate partner violence:    Fear of current or ex partner: No    Emotionally abused: No    Physically abused: No    Forced sexual  activity: No  Other Topics Concern  . Not on file  Social History Narrative   Grandson lives with patient. Is available to help her as needed.    Family History  Problem Relation Age of Onset  . Colon cancer Mother        colon  . Diabetes Mother   . Arthritis Mother   . Glaucoma Mother   . Stomach cancer Father        stomach  . Throat cancer Brother        throat  . Breast cancer Sister        breast  . Diabetes Sister   . Cirrhosis Sister   . Cancer Paternal Aunt   . Parkinson's disease Sister   . Heart attack Sister   . Ovarian cancer Sister      Current Outpatient Medications:  .  albuterol (PROAIR HFA) 108 (90 Base) MCG/ACT inhaler, Inhale 2 puffs into the lungs every 4 (four) hours as needed for wheezing or shortness of breath., Disp: 1 each, Rfl: 2 .  buPROPion (WELLBUTRIN XL) 150 MG 24 hr tablet, Take 1 tablet (150 mg total) by mouth daily., Disp: 90 tablet, Rfl: 1 .  busPIRone (BUSPAR) 30 MG tablet, TAKE 1 TABLET BY MOUTH TWICE DAILY (Patient taking differently: Take 30 mg by mouth 2 (two) times daily. ), Disp: 60 tablet, Rfl: 11 .  CALCIUM CARBONATE-VIT D-MIN PO, Take 1 tablet by mouth 2 (two) times daily. , Disp: , Rfl:  .  clotrimazole-betamethasone (LOTRISONE) cream, 1 (one) Cream Cream Apply twice daily to affected area.  Not for Internal use. (Patient taking differently: Apply 1 application topically daily as needed (rash). 1 (one) Cream Cream Apply twice daily to affected area.  Not for Internal use.), Disp: 45 g, Rfl: 1 .  escitalopram (LEXAPRO) 20 MG tablet, TAKE 1 TABLET BY MOUTH ONCE DAILY (Patient taking differently: Take 20 mg by mouth daily. ), Disp: 90 tablet, Rfl: 1 .  Fluticasone-Umeclidin-Vilant (TRELEGY ELLIPTA) 100-62.5-25 MCG/INH AEPB, Inhale 1 puff into the lungs daily., Disp: 180 each, Rfl: 3 .  lidocaine-prilocaine (EMLA) cream, Apply 1 application topically as needed., Disp: 30 g, Rfl: 0 .  Magnesium 250 MG TABS, Take 250 mg by mouth daily.,  Disp: , Rfl:  .  Omega-3 Fatty Acids (FISH OIL PO), Take 800 mg by mouth 2 (two) times daily. , Disp: , Rfl:  .  omeprazole (PRILOSEC) 40 MG capsule, Take 1  capsule (40 mg total) by mouth daily., Disp: 90 capsule, Rfl: 1 .  ondansetron (ZOFRAN) 8 MG tablet, Take 1 tablet (8 mg total) by mouth 3 (three) times daily as needed for nausea or vomiting., Disp: 30 tablet, Rfl: 2 .  polyethylene glycol powder (GLYCOLAX/MIRALAX) powder, TAKE 17 GRAMS OF POWDER MIXED IN 8 OUNCES OF WATER BY MOUTH ONCE A DAY. (Patient taking differently: Take 1 Container by mouth daily. TAKE 17 GRAMS OF POWDER MIXED IN 8 OUNCES OF WATER BY MOUTH ONCE A DAY.), Disp: 255 g, Rfl: 3 .  pravastatin (PRAVACHOL) 20 MG tablet, TAKE 1 TABLET BY MOUTH AT BEDTIME (Patient taking differently: Take 20 mg by mouth at bedtime. ), Disp: 90 tablet, Rfl: 1 .  prochlorperazine (COMPAZINE) 10 MG tablet, Take 1 tablet (10 mg total) by mouth every 6 (six) hours as needed for nausea or vomiting., Disp: 30 tablet, Rfl: 2 .  Respiratory Therapy Supplies (FLUTTER) DEVI, 1 each by Does not apply route QID., Disp: 1 each, Rfl: 0 .  rOPINIRole (REQUIP) 3 MG tablet, TAKE 1 TABLET BY MOUTH AT BEDTIME (Patient taking differently: Take 3 mg by mouth at bedtime. ), Disp: 90 tablet, Rfl: 1 .  sucralfate (CARAFATE) 1 g tablet, Take 1 tablet (1 g total) by mouth 3 (three) times daily. Dissolve in 3-4 tbsp warm water, swish and swallow., Disp: 90 tablet, Rfl: 3 No current facility-administered medications for this visit.   Facility-Administered Medications Ordered in Other Visits:  .  sodium chloride flush (NS) 0.9 % injection 10 mL, 10 mL, Intravenous, Once, Cammie Sickle, MD  Physical exam:  Vitals:   08/11/18 0930  BP: 126/83  Pulse: 62  Resp: 18  Temp: (!) 96.9 F (36.1 C)  TempSrc: Oral  Weight: 229 lb (103.9 kg)  Height: 5\' 10"  (1.778 m)   Physical Exam Constitutional:      General: She is not in acute distress. HENT:     Head:  Normocephalic and atraumatic.  Eyes:     Pupils: Pupils are equal, round, and reactive to light.  Neck:     Musculoskeletal: Normal range of motion.  Cardiovascular:     Rate and Rhythm: Normal rate and regular rhythm.     Heart sounds: Normal heart sounds.  Pulmonary:     Effort: Pulmonary effort is normal.     Breath sounds: Normal breath sounds.  Abdominal:     General: Bowel sounds are normal.     Palpations: Abdomen is soft.  Skin:    General: Skin is warm and dry.  Neurological:     Mental Status: She is alert and oriented to person, place, and time.      CMP Latest Ref Rng & Units 07/21/2018  Glucose 70 - 99 mg/dL 113(H)  BUN 8 - 23 mg/dL 13  Creatinine 0.44 - 1.00 mg/dL 0.76  Sodium 135 - 145 mmol/L 135  Potassium 3.5 - 5.1 mmol/L 4.1  Chloride 98 - 111 mmol/L 103  CO2 22 - 32 mmol/L 26  Calcium 8.9 - 10.3 mg/dL 8.8(L)  Total Protein 6.5 - 8.1 g/dL 6.6  Total Bilirubin 0.3 - 1.2 mg/dL 0.4  Alkaline Phos 38 - 126 U/L 72  AST 15 - 41 U/L 17  ALT 0 - 44 U/L 16   CBC Latest Ref Rng & Units 07/21/2018  WBC 4.0 - 10.5 K/uL 5.7  Hemoglobin 12.0 - 15.0 g/dL 12.2  Hematocrit 36.0 - 46.0 % 38.3  Platelets 150 - 400 K/uL 225  Assessment and plan- Patient is a 73 y.o. 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  She is here for on treatment assessment prior to cycle 3 of carboplatin and etoposide chemotherapy  1.  She has mild leukopenia today with a white count of 3 and an ANC of 1.1.  She is still currently undergoing concurrent radiation and therefore unable to get Neulasta along with carboplatin and etoposide.  Discussed that if we give her chemotherapy today for neutropenia could potentially worsen over the next week to 10 days.  Given the ongoing COVID pandemic- I would like to avoid any potential side effects such as neutropenic fever of hospitalization because of that.  I will therefore hold her chemotherapy today.  She will  directly proceed for chemotherapy next week carboplatin and etoposide on day 1 followed by etoposide on day 2 and day 3.    2. Patient has had to get her chemotherapy every 4 weeks instead of every 3 weeks for cycle 2 and cycle 3.  I will therefore schedule her to see Dr. Rogue Bussing 5 weeks from now for cycle 4 of carboplatin and etoposide which will be her last cycle.   Visit Diagnosis 1. Encounter for antineoplastic chemotherapy   2. Malignant neoplasm of hilus of left lung ([place])   3. Chemotherapy induced neutropenia (HCC)      Dr. Randa Evens, MD, MPH [facility] [phone] 08/11/2018 10:12 AM

## 2018-08-11 NOTE — Progress Notes (Signed)
She got carafate for esophagis discomfort and now it is better

## 2018-08-12 ENCOUNTER — Other Ambulatory Visit: Payer: Self-pay

## 2018-08-12 ENCOUNTER — Ambulatory Visit
Admission: RE | Admit: 2018-08-12 | Discharge: 2018-08-12 | Disposition: A | Discharge status: Home / Self Care | Payer: Medicare Other | Source: Ambulatory Visit | Attending: Radiation Oncology | Admitting: Radiation Oncology

## 2018-08-12 ENCOUNTER — Inpatient Hospital Stay: Payer: Medicare Other

## 2018-08-12 DIAGNOSIS — Z510 Encounter for antineoplastic radiation therapy: Secondary | ICD-10-CM | POA: Diagnosis not present

## 2018-08-13 ENCOUNTER — Other Ambulatory Visit: Payer: Self-pay

## 2018-08-13 ENCOUNTER — Ambulatory Visit
Admission: RE | Admit: 2018-08-13 | Discharge: 2018-08-13 | Disposition: A | Discharge status: Home / Self Care | Payer: Medicare Other | Source: Ambulatory Visit | Attending: Radiation Oncology | Admitting: Radiation Oncology

## 2018-08-13 ENCOUNTER — Ambulatory Visit: Payer: Medicare Other

## 2018-08-13 DIAGNOSIS — Z510 Encounter for antineoplastic radiation therapy: Secondary | ICD-10-CM | POA: Diagnosis not present

## 2018-08-14 ENCOUNTER — Ambulatory Visit
Admission: RE | Admit: 2018-08-14 | Discharge: 2018-08-14 | Disposition: A | Discharge status: Home / Self Care | Payer: Medicare Other | Source: Ambulatory Visit | Attending: Radiation Oncology | Admitting: Radiation Oncology

## 2018-08-14 ENCOUNTER — Other Ambulatory Visit: Payer: Self-pay

## 2018-08-14 DIAGNOSIS — Z510 Encounter for antineoplastic radiation therapy: Secondary | ICD-10-CM | POA: Diagnosis not present

## 2018-08-15 ENCOUNTER — Other Ambulatory Visit: Payer: Self-pay | Admitting: Physician Assistant

## 2018-08-15 DIAGNOSIS — G2581 Restless legs syndrome: Secondary | ICD-10-CM

## 2018-08-15 DIAGNOSIS — E7800 Pure hypercholesterolemia, unspecified: Secondary | ICD-10-CM

## 2018-08-15 DIAGNOSIS — F3341 Major depressive disorder, recurrent, in partial remission: Secondary | ICD-10-CM

## 2018-08-16 ENCOUNTER — Other Ambulatory Visit: Payer: Self-pay

## 2018-08-17 ENCOUNTER — Other Ambulatory Visit: Payer: Self-pay

## 2018-08-17 ENCOUNTER — Ambulatory Visit
Admission: RE | Admit: 2018-08-17 | Discharge: 2018-08-17 | Disposition: A | Discharge status: Home / Self Care | Payer: Medicare Other | Source: Ambulatory Visit | Attending: Radiation Oncology | Admitting: Radiation Oncology

## 2018-08-17 DIAGNOSIS — Z510 Encounter for antineoplastic radiation therapy: Secondary | ICD-10-CM | POA: Diagnosis not present

## 2018-08-18 ENCOUNTER — Ambulatory Visit
Admission: RE | Admit: 2018-08-18 | Discharge: 2018-08-18 | Disposition: A | Discharge status: Home / Self Care | Payer: Medicare Other | Source: Ambulatory Visit | Attending: Radiation Oncology | Admitting: Radiation Oncology

## 2018-08-18 ENCOUNTER — Other Ambulatory Visit: Payer: Self-pay

## 2018-08-18 ENCOUNTER — Other Ambulatory Visit: Payer: Self-pay | Admitting: *Deleted

## 2018-08-18 ENCOUNTER — Inpatient Hospital Stay: Payer: Medicare Other

## 2018-08-18 VITALS — BP 123/83 | HR 90 | Temp 96.9°F | Resp 18 | Wt 223.2 lb

## 2018-08-18 DIAGNOSIS — C3402 Malignant neoplasm of left main bronchus: Secondary | ICD-10-CM

## 2018-08-18 DIAGNOSIS — Z510 Encounter for antineoplastic radiation therapy: Secondary | ICD-10-CM | POA: Diagnosis not present

## 2018-08-18 DIAGNOSIS — Z5111 Encounter for antineoplastic chemotherapy: Secondary | ICD-10-CM | POA: Diagnosis not present

## 2018-08-18 LAB — CBC WITH DIFFERENTIAL/PLATELET
Abs Immature Granulocytes: 0.1 10*3/uL — ABNORMAL HIGH (ref 0.00–0.07)
Basophils Absolute: 0.1 10*3/uL (ref 0.0–0.1)
Basophils Relative: 1 %
Eosinophils Absolute: 0.1 10*3/uL (ref 0.0–0.5)
Eosinophils Relative: 2 %
HCT: 36.6 % (ref 36.0–46.0)
Hemoglobin: 11.9 g/dL — ABNORMAL LOW (ref 12.0–15.0)
Immature Granulocytes: 2 %
Lymphocytes Relative: 14 %
Lymphs Abs: 0.7 10*3/uL (ref 0.7–4.0)
MCH: 30.1 pg (ref 26.0–34.0)
MCHC: 32.5 g/dL (ref 30.0–36.0)
MCV: 92.4 fL (ref 80.0–100.0)
Monocytes Absolute: 0.9 10*3/uL (ref 0.1–1.0)
Monocytes Relative: 19 %
Neutro Abs: 3.1 10*3/uL (ref 1.7–7.7)
Neutrophils Relative %: 62 %
Platelets: 261 10*3/uL (ref 150–400)
RBC: 3.96 MIL/uL (ref 3.87–5.11)
RDW: 15.9 % — ABNORMAL HIGH (ref 11.5–15.5)
WBC: 5 10*3/uL (ref 4.0–10.5)
nRBC: 0 % (ref 0.0–0.2)

## 2018-08-18 LAB — BASIC METABOLIC PANEL
Anion gap: 6 (ref 5–15)
BUN: 23 mg/dL (ref 8–23)
CO2: 25 mmol/L (ref 22–32)
Calcium: 9.2 mg/dL (ref 8.9–10.3)
Chloride: 104 mmol/L (ref 98–111)
Creatinine, Ser: 0.74 mg/dL (ref 0.44–1.00)
GFR calc Af Amer: >60 mL/min (ref >60)
GFR calc non Af Amer: >60 mL/min (ref >60)
Glucose, Bld: 102 mg/dL — ABNORMAL HIGH (ref 70–99)
Potassium: 4.2 mmol/L (ref 3.5–5.1)
Sodium: 135 mmol/L (ref 135–145)

## 2018-08-18 MED ORDER — SODIUM CHLORIDE 0.9 % IV SOLN
412.0000 mg | Freq: Once | INTRAVENOUS | Status: AC
  Administered 2018-08-18: 410 mg via INTRAVENOUS
  Filled 2018-08-18: qty 41

## 2018-08-18 MED ORDER — DEXAMETHASONE SODIUM PHOSPHATE 10 MG/ML IJ SOLN
10.0000 mg | Freq: Once | INTRAMUSCULAR | Status: AC
  Administered 2018-08-18: 10:00:00 10 mg via INTRAVENOUS
  Filled 2018-08-18: qty 1

## 2018-08-18 MED ORDER — PALONOSETRON HCL INJECTION 0.25 MG/5ML
0.2500 mg | Freq: Once | INTRAVENOUS | Status: AC
  Administered 2018-08-18: 10:00:00 0.25 mg via INTRAVENOUS
  Filled 2018-08-18: qty 5

## 2018-08-18 MED ORDER — SODIUM CHLORIDE 0.9 % IV SOLN
100.0000 mg/m2 | Freq: Once | INTRAVENOUS | Status: AC
  Administered 2018-08-18: 220 mg via INTRAVENOUS
  Filled 2018-08-18: qty 11

## 2018-08-18 MED ORDER — HEPARIN SOD (PORK) LOCK FLUSH 100 UNIT/ML IV SOLN
500.0000 [IU] | Freq: Once | INTRAVENOUS | Status: AC
  Administered 2018-08-18: 500 [IU] via INTRAVENOUS
  Filled 2018-08-18: qty 5

## 2018-08-18 MED ORDER — SODIUM CHLORIDE 0.9% FLUSH
10.0000 mL | INTRAVENOUS | Status: DC | PRN
  Administered 2018-08-18: 10 mL via INTRAVENOUS
  Filled 2018-08-18: qty 10

## 2018-08-18 MED ORDER — SODIUM CHLORIDE 0.9 % IV SOLN
Freq: Once | INTRAVENOUS | Status: AC
  Administered 2018-08-18: 10:00:00 via INTRAVENOUS
  Filled 2018-08-18: qty 250

## 2018-08-18 MED ORDER — SODIUM CHLORIDE 0.9 % IV SOLN: 100.0000 mg/m2 | Freq: Once | INTRAVENOUS | Status: DC

## 2018-08-19 ENCOUNTER — Other Ambulatory Visit: Payer: Self-pay

## 2018-08-19 ENCOUNTER — Inpatient Hospital Stay: Payer: Medicare Other

## 2018-08-19 ENCOUNTER — Ambulatory Visit
Admission: RE | Admit: 2018-08-19 | Discharge: 2018-08-19 | Disposition: A | Discharge status: Home / Self Care | Payer: Medicare Other | Source: Ambulatory Visit | Attending: Radiation Oncology | Admitting: Radiation Oncology

## 2018-08-19 VITALS — BP 130/85 | HR 84 | Temp 97.8°F | Resp 18

## 2018-08-19 DIAGNOSIS — Z510 Encounter for antineoplastic radiation therapy: Secondary | ICD-10-CM | POA: Diagnosis not present

## 2018-08-19 DIAGNOSIS — Z5111 Encounter for antineoplastic chemotherapy: Secondary | ICD-10-CM | POA: Diagnosis not present

## 2018-08-19 DIAGNOSIS — C3402 Malignant neoplasm of left main bronchus: Secondary | ICD-10-CM

## 2018-08-19 MED ORDER — DEXAMETHASONE SODIUM PHOSPHATE 10 MG/ML IJ SOLN
10.0000 mg | Freq: Once | INTRAMUSCULAR | Status: AC
  Administered 2018-08-19: 10 mg via INTRAVENOUS
  Filled 2018-08-19: qty 1

## 2018-08-19 MED ORDER — HEPARIN SOD (PORK) LOCK FLUSH 100 UNIT/ML IV SOLN
500.0000 [IU] | Freq: Once | INTRAVENOUS | Status: AC | PRN
  Administered 2018-08-19: 13:00:00 500 [IU]
  Filled 2018-08-19: qty 5

## 2018-08-19 MED ORDER — SODIUM CHLORIDE 0.9 % IV SOLN
100.0000 mg/m2 | Freq: Once | INTRAVENOUS | Status: AC
  Administered 2018-08-19: 220 mg via INTRAVENOUS
  Filled 2018-08-19: qty 11

## 2018-08-19 MED ORDER — SODIUM CHLORIDE 0.9 % IV SOLN
Freq: Once | INTRAVENOUS | Status: AC
  Administered 2018-08-19: 11:00:00 via INTRAVENOUS
  Filled 2018-08-19: qty 250

## 2018-08-20 ENCOUNTER — Other Ambulatory Visit: Payer: Self-pay

## 2018-08-20 ENCOUNTER — Ambulatory Visit
Admission: RE | Admit: 2018-08-20 | Discharge: 2018-08-20 | Disposition: A | Discharge status: Home / Self Care | Payer: Medicare Other | Source: Ambulatory Visit | Attending: Radiation Oncology | Admitting: Radiation Oncology

## 2018-08-20 ENCOUNTER — Inpatient Hospital Stay: Payer: Medicare Other

## 2018-08-20 VITALS — BP 148/83 | HR 96 | Temp 97.4°F | Resp 20

## 2018-08-20 DIAGNOSIS — C3402 Malignant neoplasm of left main bronchus: Secondary | ICD-10-CM

## 2018-08-20 DIAGNOSIS — Z5111 Encounter for antineoplastic chemotherapy: Secondary | ICD-10-CM | POA: Diagnosis not present

## 2018-08-20 DIAGNOSIS — Z510 Encounter for antineoplastic radiation therapy: Secondary | ICD-10-CM | POA: Diagnosis not present

## 2018-08-20 MED ORDER — SODIUM CHLORIDE 0.9 % IV SOLN
100.0000 mg/m2 | Freq: Once | INTRAVENOUS | Status: AC
  Administered 2018-08-20: 220 mg via INTRAVENOUS
  Filled 2018-08-20: qty 11

## 2018-08-20 MED ORDER — DEXAMETHASONE SODIUM PHOSPHATE 10 MG/ML IJ SOLN
10.0000 mg | Freq: Once | INTRAMUSCULAR | Status: AC
  Administered 2018-08-20: 10:00:00 10 mg via INTRAVENOUS
  Filled 2018-08-20: qty 1

## 2018-08-20 MED ORDER — SODIUM CHLORIDE 0.9% FLUSH
10.0000 mL | INTRAVENOUS | Status: DC | PRN
  Administered 2018-08-20: 10:00:00 10 mL
  Filled 2018-08-20: qty 10

## 2018-08-20 MED ORDER — HEPARIN SOD (PORK) LOCK FLUSH 100 UNIT/ML IV SOLN
500.0000 [IU] | Freq: Once | INTRAVENOUS | Status: AC | PRN
  Administered 2018-08-20: 12:00:00 500 [IU]

## 2018-08-20 MED ORDER — SODIUM CHLORIDE 0.9 % IV SOLN
Freq: Once | INTRAVENOUS | Status: AC
  Administered 2018-08-20: 10:00:00 via INTRAVENOUS
  Filled 2018-08-20: qty 250

## 2018-09-14 ENCOUNTER — Other Ambulatory Visit: Payer: Self-pay

## 2018-09-14 ENCOUNTER — Other Ambulatory Visit: Payer: Self-pay | Admitting: *Deleted

## 2018-09-14 DIAGNOSIS — C3431 Malignant neoplasm of lower lobe, right bronchus or lung: Secondary | ICD-10-CM

## 2018-09-14 DIAGNOSIS — C3402 Malignant neoplasm of left main bronchus: Secondary | ICD-10-CM

## 2018-09-15 ENCOUNTER — Inpatient Hospital Stay (HOSPITAL_BASED_OUTPATIENT_CLINIC_OR_DEPARTMENT_OTHER): Payer: Medicare Other | Admitting: Internal Medicine

## 2018-09-15 ENCOUNTER — Inpatient Hospital Stay: Payer: Medicare Other

## 2018-09-15 ENCOUNTER — Other Ambulatory Visit: Payer: Self-pay

## 2018-09-15 ENCOUNTER — Other Ambulatory Visit: Payer: Medicare Other

## 2018-09-15 ENCOUNTER — Encounter: Payer: Self-pay | Admitting: *Deleted

## 2018-09-15 ENCOUNTER — Inpatient Hospital Stay: Payer: Medicare Other | Attending: Internal Medicine

## 2018-09-15 VITALS — BP 147/95 | HR 91 | Temp 97.7°F | Resp 20 | Ht 70.0 in | Wt 227.0 lb

## 2018-09-15 DIAGNOSIS — C3431 Malignant neoplasm of lower lobe, right bronchus or lung: Secondary | ICD-10-CM

## 2018-09-15 DIAGNOSIS — Z923 Personal history of irradiation: Secondary | ICD-10-CM | POA: Diagnosis not present

## 2018-09-15 DIAGNOSIS — Z808 Family history of malignant neoplasm of other organs or systems: Secondary | ICD-10-CM

## 2018-09-15 DIAGNOSIS — Z95828 Presence of other vascular implants and grafts: Secondary | ICD-10-CM

## 2018-09-15 DIAGNOSIS — C3402 Malignant neoplasm of left main bronchus: Secondary | ICD-10-CM

## 2018-09-15 DIAGNOSIS — Z800 Family history of malignant neoplasm of digestive organs: Secondary | ICD-10-CM

## 2018-09-15 DIAGNOSIS — J449 Chronic obstructive pulmonary disease, unspecified: Secondary | ICD-10-CM | POA: Diagnosis not present

## 2018-09-15 DIAGNOSIS — Z79899 Other long term (current) drug therapy: Secondary | ICD-10-CM

## 2018-09-15 DIAGNOSIS — Z5111 Encounter for antineoplastic chemotherapy: Secondary | ICD-10-CM | POA: Insufficient documentation

## 2018-09-15 DIAGNOSIS — C7A8 Other malignant neuroendocrine tumors: Secondary | ICD-10-CM

## 2018-09-15 DIAGNOSIS — Z8041 Family history of malignant neoplasm of ovary: Secondary | ICD-10-CM

## 2018-09-15 DIAGNOSIS — Z87891 Personal history of nicotine dependence: Secondary | ICD-10-CM

## 2018-09-15 DIAGNOSIS — Z803 Family history of malignant neoplasm of breast: Secondary | ICD-10-CM

## 2018-09-15 LAB — CBC WITH DIFFERENTIAL/PLATELET
Abs Immature Granulocytes: 0.13 10*3/uL — ABNORMAL HIGH (ref 0.00–0.07)
Basophils Absolute: 0.1 10*3/uL (ref 0.0–0.1)
Basophils Relative: 1 %
Eosinophils Absolute: 0.1 10*3/uL (ref 0.0–0.5)
Eosinophils Relative: 2 %
HCT: 35.2 % — ABNORMAL LOW (ref 36.0–46.0)
Hemoglobin: 11.4 g/dL — ABNORMAL LOW (ref 12.0–15.0)
Immature Granulocytes: 2 %
Lymphocytes Relative: 18 %
Lymphs Abs: 1 10*3/uL (ref 0.7–4.0)
MCH: 30.4 pg (ref 26.0–34.0)
MCHC: 32.4 g/dL (ref 30.0–36.0)
MCV: 93.9 fL (ref 80.0–100.0)
Monocytes Absolute: 1.1 10*3/uL — ABNORMAL HIGH (ref 0.1–1.0)
Monocytes Relative: 19 %
Neutro Abs: 3.3 10*3/uL (ref 1.7–7.7)
Neutrophils Relative %: 58 %
Platelets: 232 10*3/uL (ref 150–400)
RBC: 3.75 MIL/uL — ABNORMAL LOW (ref 3.87–5.11)
RDW: 17.1 % — ABNORMAL HIGH (ref 11.5–15.5)
WBC: 5.6 10*3/uL (ref 4.0–10.5)
nRBC: 0 % (ref 0.0–0.2)

## 2018-09-15 LAB — COMPREHENSIVE METABOLIC PANEL
ALT: 25 U/L (ref 0–44)
AST: 22 U/L (ref 15–41)
Albumin: 3.8 g/dL (ref 3.5–5.0)
Alkaline Phosphatase: 71 U/L (ref 38–126)
Anion gap: 8 (ref 5–15)
BUN: 15 mg/dL (ref 8–23)
CO2: 27 mmol/L (ref 22–32)
Calcium: 9 mg/dL (ref 8.9–10.3)
Chloride: 101 mmol/L (ref 98–111)
Creatinine, Ser: 0.79 mg/dL (ref 0.44–1.00)
GFR calc Af Amer: >60 mL/min (ref >60)
GFR calc non Af Amer: >60 mL/min (ref >60)
Glucose, Bld: 109 mg/dL — ABNORMAL HIGH (ref 70–99)
Potassium: 4.3 mmol/L (ref 3.5–5.1)
Sodium: 136 mmol/L (ref 135–145)
Total Bilirubin: 0.4 mg/dL (ref 0.3–1.2)
Total Protein: 6.9 g/dL (ref 6.5–8.1)

## 2018-09-15 MED ORDER — DEXAMETHASONE SODIUM PHOSPHATE 10 MG/ML IJ SOLN
10.0000 mg | Freq: Once | INTRAMUSCULAR | Status: AC
  Administered 2018-09-15: 10 mg via INTRAVENOUS
  Filled 2018-09-15: qty 1

## 2018-09-15 MED ORDER — SODIUM CHLORIDE 0.9% FLUSH
10.0000 mL | Freq: Once | INTRAVENOUS | Status: AC
  Administered 2018-09-15: 10 mL via INTRAVENOUS
  Filled 2018-09-15: qty 10

## 2018-09-15 MED ORDER — SODIUM CHLORIDE 0.9 % IV SOLN
100.0000 mg/m2 | Freq: Once | INTRAVENOUS | Status: AC
  Administered 2018-09-15: 220 mg via INTRAVENOUS
  Filled 2018-09-15: qty 11

## 2018-09-15 MED ORDER — HEPARIN SOD (PORK) LOCK FLUSH 100 UNIT/ML IV SOLN
500.0000 [IU] | Freq: Once | INTRAVENOUS | Status: AC | PRN
  Administered 2018-09-15: 500 [IU]
  Filled 2018-09-15: qty 5

## 2018-09-15 MED ORDER — SODIUM CHLORIDE 0.9 % IV SOLN
515.0000 mg | Freq: Once | INTRAVENOUS | Status: AC
  Administered 2018-09-15: 520 mg via INTRAVENOUS
  Filled 2018-09-15: qty 52

## 2018-09-15 MED ORDER — SODIUM CHLORIDE 0.9 % IV SOLN
Freq: Once | INTRAVENOUS | Status: AC
  Administered 2018-09-15: 10:00:00 via INTRAVENOUS
  Filled 2018-09-15: qty 250

## 2018-09-15 MED ORDER — PALONOSETRON HCL INJECTION 0.25 MG/5ML
0.2500 mg | Freq: Once | INTRAVENOUS | Status: AC
  Administered 2018-09-15: 0.25 mg via INTRAVENOUS
  Filled 2018-09-15: qty 5

## 2018-09-15 NOTE — Progress Notes (Signed)
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 NOV 15th CT NED  # DEC-JAN 2020-CT/PET-Left hilar/mediastinal shift large cell neuroendocrine; Limited stage.TxN2-Stage III  # 18th Feb 2020- carbo-Etop with RT [finished RT April 16th]; #4 cycle carbo-etop on 09/15/2018.   # COPD on 02 at night; hx of smoking.  --------------------------------------------------    DIAGNOSIS: Lung ca  STAGE:    I     ;GOALS: cure  CURRENT/MOST RECENT THERAPY: surveillaince      Malignant neoplasm of hilus of left lung ([place])    Cancer of lower lobe of right lung [facility])       [place] OFFICE PROGRESS NOTE  Patient Care Team: Mar Daring, [name], [name], RN as Registered Nurse  Cancer Staging Cancer of lower lobe of right lung [facility]) Staging form: Lung, AJCC 7th Edition - Clinical: No stage assigned - Unsigned    Oncology History   # SEP 2016-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 NOV 15th CT NED  # DEC-JAN 2020-CT/PET-Left hilar/mediastinal shift large cell neuroendocrine; Limited stage.TxN2-Stage III  # 18th Feb 2020- carbo-Etop with RT [finished RT April 16th]; #4 cycle carbo-etop on 09/15/2018.   # COPD on 02 at night; hx of smoking.  --------------------------------------------------    DIAGNOSIS: Lung ca  STAGE:    I      ;GOALS: cure  CURRENT/MOST RECENT THERAPY: surveillaince      Malignant neoplasm of hilus of left lung ([place])    Cancer of lower lobe of right lung ([place])     INTERVAL HISTORY:  Cathy Watts 73 y.o.  female pleasant patient above history of left hilar large cell neuroendocrine-/limited stage-currently on concurrent chemoradiation is here for follow-up.  Patient finished radiation approximately 4 weeks ago in the middle of April.  She denies any nausea vomiting.  Denies any chest pain or cough or shortness of breath.  No fever chills.  No headaches.   Review of Systems  Constitutional: Positive for malaise/fatigue. Negative for chills, diaphoresis, fever and weight loss.  HENT: Negative for nosebleeds and sore throat.   Eyes: Negative for double vision.  Respiratory: Positive for cough and shortness of breath. Negative for hemoptysis, sputum production and wheezing.   Cardiovascular: Negative for chest pain, palpitations, orthopnea and leg swelling.  Gastrointestinal: Negative for abdominal pain, blood in stool, constipation, diarrhea, heartburn, melena, nausea and vomiting.  Genitourinary: Negative for dysuria, frequency and urgency.  Musculoskeletal: Negative for back pain and joint pain.  Skin: Negative.  Negative for itching and rash.  Neurological: Negative for dizziness, tingling, focal weakness, weakness and headaches.  Endo/Heme/Allergies: Does not bruise/bleed easily.  Psychiatric/Behavioral: Negative for depression. The patient is not nervous/anxious and does not have insomnia.       PAST MEDICAL HISTORY :  Past Medical History:  Diagnosis Date  . Abdominal aortic aneurysm (AAA) 3.0 cm to 5.0 cm in diameter in female (  [place]) 05/2018   being followed by dr. Delana Meyer. will reultrasound in 1 year  . Anxiety   . Asthma   . Barrett's esophagus   . COPD (chronic obstructive pulmonary disease) ([place])   . Depression   . GERD (gastroesophageal reflux disease)   .  History of peptic ulcer disease   . Hypercholesterolemia   . Low oxygen saturation    2l hs  . Osteopenia   . Panic disorder   . Personal history of radiation therapy   . Personal history of tobacco use, presenting hazards to health 01/16/2015  . Requires supplemental oxygen 05/2018   supposed to use 2L NP at night but she currently does not.  encouraged patient to resume this  . Sleep apnea    snores significantly but has never been tested for OSA  . SOB (shortness of breath)   . Squamous cell carcinoma of right lung ([place]) 2015    PAST SURGICAL HISTORY :   Past Surgical History:  Procedure Laterality Date  . CERVICAL CONE BIOPSY  1990  . CHOLECYSTECTOMY  1980's  . colonoscopy  2014  . COLONOSCOPY  07/21/2012  . COLONOSCOPY WITH PROPOFOL N/A 05/12/2018   Procedure: COLONOSCOPY WITH PROPOFOL;  Surgeon: Lollie Sails, MD;  Location: [facility] ENDOSCOPY;  Service: Endoscopy;  Laterality: N/A;  . ELBOW FRACTURE SURGERY Right 2009  . ENDOBRONCHIAL ULTRASOUND N/A 02/07/2015   Procedure: ENDOBRONCHIAL ULTRASOUND;  Surgeon: Flora Lipps, MD;  Location: [facility] ORS;  Service: Cardiopulmonary;  Laterality: N/A;  . ENDOBRONCHIAL ULTRASOUND Left 06/05/2018   Procedure: ENDOBRONCHIAL ULTRASOUND;  Surgeon: Flora Lipps, MD;  Location: [facility] ORS;  Service: Cardiopulmonary;  Laterality: Left;  . ESOPHAGOGASTRODUODENOSCOPY (EGD) WITH PROPOFOL N/A 02/26/2016   Procedure: ESOPHAGOGASTRODUODENOSCOPY (EGD) WITH PROPOFOL;  Surgeon: Lollie Sails, MD;  Location: [facility] ENDOSCOPY;  Service: Endoscopy;  Laterality: N/A;  COPD, Sleep Apnea  . ESOPHAGOGASTRODUODENOSCOPY (EGD) WITH PROPOFOL N/A 05/12/2018   Procedure: ESOPHAGOGASTRODUODENOSCOPY (EGD) WITH PROPOFOL;  Surgeon: Lollie Sails, MD;  Location[facility] ENDOSCOPY;  Service: Endoscopy;  Laterality: N/A;  . EYE SURGERY Bilateral    cataract extractions  . FRACTURE SURGERY  2005   elbow repair  . PORTA CATH INSERTION N/A 06/17/2018   Procedure: PORTA CATH  INSERTION;  Surgeon: Katha Cabal, MD;  Location: [place] CV LAB;  Service: Cardiovascular;  Laterality: N/A;  . TONSILLECTOMY  1979  . TUBAL LIGATION  1970's  . UPPER GI ENDOSCOPY      FAMILY HISTORY :   Family History  Problem Relation Age of Onset  . Colon cancer Mother        colon  . Diabetes Mother   . Arthritis Mother   . Glaucoma Mother   . Stomach cancer Father        stomach  . Throat cancer Brother        throat  . Breast cancer Sister        breast  . Diabetes Sister   . Cirrhosis Sister   . Cancer Paternal Aunt   . Parkinson's disease Sister   . Heart attack Sister   . Ovarian cancer Sister     SOCIAL HISTORY:   Social History   Tobacco Use  . Smoking status: Former Smoker    Packs/day: 1.00    Years: 55.00    Pack years: 55.00    Types: Cigarettes    Last attempt to quit: 11/02/2017    Years since quitting: 0.8  . Smokeless tobacco: Never Used  .  Tobacco comment: used to use E-cig  Substance Use Topics  . Alcohol use: Yes    Alcohol/week: 0.0 standard drinks    Comment: occasionally- wine  . Drug use: No    ALLERGIES:  is allergic to amoxicillin.  MEDICATIONS:  Current Outpatient Medications  Medication Sig Dispense Refill  . albuterol (PROAIR HFA) 108 (90 Base) MCG/ACT inhaler Inhale 2 puffs into the lungs every 4 (four) hours as needed for wheezing or shortness of breath. 1 each 2  . buPROPion (WELLBUTRIN XL) 150 MG 24 hr tablet Take 1 tablet by mouth once daily 90 tablet 1  . busPIRone (BUSPAR) 30 MG tablet TAKE 1 TABLET BY MOUTH TWICE DAILY (Patient taking differently: Take 30 mg by mouth 2 (two) times daily. ) 60 tablet 11  . CALCIUM CARBONATE-VIT D-MIN PO Take 1 tablet by mouth 2 (two) times daily.     . clotrimazole-betamethasone (LOTRISONE) cream 1 (one) Cream Cream Apply twice daily to affected area.  Not for Internal use. (Patient taking differently: Apply 1 application topically daily as needed (rash). 1 (one) Cream Cream  Apply twice daily to affected area.  Not for Internal use.) 45 g 1  . escitalopram (LEXAPRO) 20 MG tablet TAKE 1 TABLET BY MOUTH ONCE DAILY (Patient taking differently: Take 20 mg by mouth daily. ) 90 tablet 1  . Fluticasone-Umeclidin-Vilant (TRELEGY ELLIPTA) 100-62.5-25 MCG/INH AEPB Inhale 1 puff into the lungs daily. 180 each 3  . lidocaine-prilocaine (EMLA) cream Apply 1 application topically as needed. 30 g 0  . Magnesium 250 MG TABS Take 250 mg by mouth daily.    . Omega-3 Fatty Acids (FISH OIL PO) Take 800 mg by mouth 2 (two) times daily.     Marland Kitchen omeprazole (PRILOSEC) 40 MG capsule Take 1 capsule (40 mg total) by mouth daily. 90 capsule 1  . ondansetron (ZOFRAN) 8 MG tablet Take 1 tablet (8 mg total) by mouth 3 (three) times daily as needed for nausea or vomiting. (Patient not taking: Reported on 08/11/2018) 30 tablet 2  . polyethylene glycol powder (GLYCOLAX/MIRALAX) powder TAKE 17 GRAMS OF POWDER MIXED IN 8 OUNCES OF WATER BY MOUTH ONCE A DAY. (Patient taking differently: Take 1 Container by mouth daily. TAKE 17 GRAMS OF POWDER MIXED IN 8 OUNCES OF WATER BY MOUTH ONCE A DAY.) 255 g 3  . pravastatin (PRAVACHOL) 20 MG tablet TAKE 1 TABLET BY MOUTH AT BEDTIME 90 tablet 1  . prochlorperazine (COMPAZINE) 10 MG tablet Take 1 tablet (10 mg total) by mouth every 6 (six) hours as needed for nausea or vomiting. (Patient not taking: Reported on 08/11/2018) 30 tablet 2  . Respiratory Therapy Supplies (FLUTTER) DEVI 1 each by Does not apply route QID. 1 each 0  . rOPINIRole (REQUIP) 3 MG tablet TAKE 1 TABLET BY MOUTH AT BEDTIME 90 tablet 1  . sucralfate (CARAFATE) 1 g tablet Take 1 tablet (1 g total) by mouth 3 (three) times daily. Dissolve in 3-4 tbsp warm water, swish and swallow. 90 tablet 3   No current facility-administered medications for this visit.    Facility-Administered Medications Ordered in Other Visits  Medication Dose Route Frequency Provider Last Rate Last Dose  . CARBOplatin (PARAPLATIN) 520  mg in sodium chloride 0.9 % 250 mL chemo infusion  520 mg Intravenous Once Charlaine Dalton R, MD      . dexamethasone (DECADRON) injection 10 mg  10 mg Intravenous Once Charlaine Dalton R, MD      . etoposide (VEPESID) 220 mg in  sodium chloride 0.9 % 1,000 mL chemo infusion  100 mg/m2 (Treatment Plan Recorded) Intravenous Once Charlaine Dalton R, MD      . heparin lock flush 100 unit/mL  500 Units Intracatheter Once PRN Cammie Sickle, MD      . palonosetron (ALOXI) injection 0.25 mg  0.25 mg Intravenous Once Cammie Sickle, MD        PHYSICAL EXAMINATION: ECOG PERFORMANCE STATUS: 1 - Symptomatic but completely ambulatory  BP (!) 147/95   Pulse 91   Temp 97.7 F (36.5 C) (Tympanic)   Resp 20   Ht 5\' 10"  (1.778 m)   Wt 227 lb (103 kg)   BMI 32.57 kg/m   Filed Weights   09/15/18 0908  Weight: 227 lb (103 kg)   Physical Exam  Constitutional: She is oriented to person, place, and time and well-developed, well-nourished, and in no distress.  She is alone.  Not on oxygen.  She is walking herself.  HENT:  Head: Normocephalic and atraumatic.  Mouth/Throat: Oropharynx is clear and moist. No oropharyngeal exudate.  Eyes: Pupils are equal, round, and reactive to light.  Neck: Normal range of motion. Neck supple.  Cardiovascular: Normal rate and regular rhythm.  Pulmonary/Chest: No respiratory distress. She has no wheezes.  Decreased air entry bilaterally.  Abdominal: Soft. Bowel sounds are normal. She exhibits no distension and no mass. There is no abdominal tenderness. There is no rebound and no guarding.  Musculoskeletal: Normal range of motion.        General: No tenderness or edema.  Neurological: She is alert and oriented to person, place, and time.  Skin: Skin is warm.  Psychiatric: Affect normal.      LABORATORY DATA:  I have reviewed the data as listed    Component Value Date/Time   NA 136 09/15/2018 0851   NA 140 05/01/2018 1116   K 4.3  09/15/2018 0851   CL 101 09/15/2018 0851   CO2 27 09/15/2018 0851   GLUCOSE 109 (H) 09/15/2018 0851   BUN 15 09/15/2018 0851   BUN 15 05/01/2018 1116   CREATININE 0.79 09/15/2018 0851   CALCIUM 9.0 09/15/2018 0851   PROT 6.9 09/15/2018 0851   PROT 6.7 05/01/2018 1116   ALBUMIN 3.8 09/15/2018 0851   ALBUMIN 4.2 05/01/2018 1116   AST 22 09/15/2018 0851   ALT 25 09/15/2018 0851   ALKPHOS 71 09/15/2018 0851   BILITOT 0.4 09/15/2018 0851   BILITOT 0.3 05/01/2018 1116   GFRNONAA >60 09/15/2018 0851   GFRAA >60 09/15/2018 0851    No results found for: SPEP, UPEP  Lab Results  Component Value Date   WBC 5.6 09/15/2018   NEUTROABS 3.3 09/15/2018   HGB 11.4 (L) 09/15/2018   HCT 35.2 (L) 09/15/2018   MCV 93.9 09/15/2018   PLT 232 09/15/2018      Chemistry      Component Value Date/Time   NA 136 09/15/2018 0851   NA 140 05/01/2018 1116   K 4.3 09/15/2018 0851   CL 101 09/15/2018 0851   CO2 27 09/15/2018 0851   BUN 15 09/15/2018 0851   BUN 15 05/01/2018 1116   CREATININE 0.79 09/15/2018 0851      Component Value Date/Time   CALCIUM 9.0 09/15/2018 0851   ALKPHOS 71 09/15/2018 0851   AST 22 09/15/2018 0851   ALT 25 09/15/2018 0851   BILITOT 0.4 09/15/2018 0851   BILITOT 0.3 05/01/2018 1116       RADIOGRAPHIC STUDIES: I have  personally reviewed the radiological images as listed and agreed with the findings in the report. No results found.   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  TxN2-Stage III; currently on carbo-Etop q 3 w- with concurrent RT.   # Proceed with cycle #4 of carbo etoposide. Labs today reviewed;  acceptable for treatment today.  We will plan to get a CT scan after this cycle in approximately 2 months from now.  #COPD-continue inhalers.Stable.   # DISPOSITION: # Proceed with chemo this week.  # follow up in 2 month/MD-labs- cbc/cmp; CT chest prior- Dr.B   Orders  Placed This Encounter  Procedures  . CT CHEST W CONTRAST    Standing Status:   Future    Standing Expiration Date:   09/15/2019    Order Specific Question:   If indicated for the ordered procedure, I authorize the administration of contrast media per Radiology protocol    Answer:   Yes    Order Specific Question:   Preferred imaging location?    Answer:   [facility]    Order Specific Question:   Radiology Contrast Protocol - do NOT remove file path    Answer:   [email or web address]    Order Specific Question:   ** REASON FOR EXAM (FREE TEXT)    Answer:   lung cancer  . CBC with Differential    Standing Status:   Future    Standing Expiration Date:   09/15/2019  . Comprehensive metabolic panel    Standing Status:   Future    Standing Expiration Date:   09/15/2019   All questions were answered. The patient knows to call the clinic with any problems, questions or concerns.      Cammie Sickle, MD 09/15/2018 9:46 AM

## 2018-09-15 NOTE — Assessment & Plan Note (Addendum)
#  Left hilar- large cell neuroendocrine; limited stage.  TxN2-Stage III; currently on carbo-Etop q 3 w- with concurrent RT.   # Proceed with cycle #4 of carbo etoposide. Labs today reviewed;  acceptable for treatment today.  We will plan to get a CT scan after this cycle in approximately 2 months from now.  #COPD-continue inhalers.Stable.   # DISPOSITION: # Proceed with chemo this week.  # follow up in 2 month/MD-labs- cbc/cmp; CT chest prior- Dr.B

## 2018-09-16 ENCOUNTER — Inpatient Hospital Stay: Payer: Medicare Other

## 2018-09-16 ENCOUNTER — Other Ambulatory Visit: Payer: Self-pay

## 2018-09-16 VITALS — BP 135/79 | HR 80 | Temp 97.8°F | Resp 20

## 2018-09-16 DIAGNOSIS — C3402 Malignant neoplasm of left main bronchus: Secondary | ICD-10-CM

## 2018-09-16 DIAGNOSIS — Z5111 Encounter for antineoplastic chemotherapy: Secondary | ICD-10-CM | POA: Diagnosis not present

## 2018-09-16 MED ORDER — SODIUM CHLORIDE 0.9 % IV SOLN
Freq: Once | INTRAVENOUS | Status: AC
  Administered 2018-09-16: 10:00:00 via INTRAVENOUS
  Filled 2018-09-16: qty 250

## 2018-09-16 MED ORDER — SODIUM CHLORIDE 0.9 % IV SOLN
100.0000 mg/m2 | Freq: Once | INTRAVENOUS | Status: AC
  Administered 2018-09-16: 220 mg via INTRAVENOUS
  Filled 2018-09-16: qty 11

## 2018-09-16 MED ORDER — DEXAMETHASONE SODIUM PHOSPHATE 10 MG/ML IJ SOLN
10.0000 mg | Freq: Once | INTRAMUSCULAR | Status: AC
  Administered 2018-09-16: 10 mg via INTRAVENOUS
  Filled 2018-09-16: qty 1

## 2018-09-16 MED ORDER — HEPARIN SOD (PORK) LOCK FLUSH 100 UNIT/ML IV SOLN
500.0000 [IU] | Freq: Once | INTRAVENOUS | Status: AC | PRN
  Administered 2018-09-16: 12:00:00 500 [IU]
  Filled 2018-09-16: qty 5

## 2018-09-17 ENCOUNTER — Other Ambulatory Visit: Payer: Self-pay

## 2018-09-17 ENCOUNTER — Inpatient Hospital Stay: Payer: Medicare Other

## 2018-09-17 VITALS — BP 156/88 | HR 83 | Temp 98.0°F | Resp 20

## 2018-09-17 DIAGNOSIS — C3402 Malignant neoplasm of left main bronchus: Secondary | ICD-10-CM

## 2018-09-17 DIAGNOSIS — Z5111 Encounter for antineoplastic chemotherapy: Secondary | ICD-10-CM | POA: Diagnosis not present

## 2018-09-17 MED ORDER — SODIUM CHLORIDE 0.9 % IV SOLN
Freq: Once | INTRAVENOUS | Status: AC
  Administered 2018-09-17: 10:00:00 via INTRAVENOUS
  Filled 2018-09-17: qty 250

## 2018-09-17 MED ORDER — SODIUM CHLORIDE 0.9% FLUSH
10.0000 mL | INTRAVENOUS | Status: DC | PRN
  Administered 2018-09-17: 10 mL
  Filled 2018-09-17: qty 10

## 2018-09-17 MED ORDER — DEXAMETHASONE SODIUM PHOSPHATE 10 MG/ML IJ SOLN
10.0000 mg | Freq: Once | INTRAMUSCULAR | Status: AC
  Administered 2018-09-17: 10 mg via INTRAVENOUS
  Filled 2018-09-17: qty 1

## 2018-09-17 MED ORDER — SODIUM CHLORIDE 0.9 % IV SOLN
100.0000 mg/m2 | Freq: Once | INTRAVENOUS | Status: AC
  Administered 2018-09-17: 220 mg via INTRAVENOUS
  Filled 2018-09-17: qty 11

## 2018-09-17 MED ORDER — HEPARIN SOD (PORK) LOCK FLUSH 100 UNIT/ML IV SOLN
500.0000 [IU] | Freq: Once | INTRAVENOUS | Status: AC | PRN
  Administered 2018-09-17: 500 [IU]
  Filled 2018-09-17: qty 5

## 2018-09-30 ENCOUNTER — Encounter: Payer: Self-pay | Admitting: Radiation Oncology

## 2018-09-30 ENCOUNTER — Other Ambulatory Visit: Payer: Self-pay

## 2018-09-30 ENCOUNTER — Ambulatory Visit
Admission: RE | Admit: 2018-09-30 | Discharge: 2018-09-30 | Disposition: A | Discharge status: Home / Self Care | Payer: Medicare Other | Source: Ambulatory Visit | Attending: Radiation Oncology | Admitting: Radiation Oncology

## 2018-09-30 DIAGNOSIS — C7A8 Other malignant neuroendocrine tumors: Secondary | ICD-10-CM | POA: Insufficient documentation

## 2018-09-30 DIAGNOSIS — C3402 Malignant neoplasm of left main bronchus: Secondary | ICD-10-CM

## 2018-09-30 DIAGNOSIS — Z923 Personal history of irradiation: Secondary | ICD-10-CM | POA: Diagnosis not present

## 2018-09-30 DIAGNOSIS — Z9221 Personal history of antineoplastic chemotherapy: Secondary | ICD-10-CM | POA: Insufficient documentation

## 2018-09-30 NOTE — Progress Notes (Signed)
Radiation Oncology Follow up Note  Name: Cathy Watts   Date:   09/30/2018 MRN:  [id number] DOB: Mar 06, 1946    This 73 y.o. 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  REFERRING PROVIDER: Florian Buff*  HPI: Patient is a 73 year old female now at 1 month having completed concurrent chemoradiation therapy for stage III (TX N2 M0) large cell neuroendocrine tumor limited stage.  Patient had received etoposide with concurrent radiation.  She has completed [place] etoposide and is doing well she specifically denies cough hemoptysis or chest tightness.  P.o. intake is good..  COMPLICATIONS OF TREATMENT: none  FOLLOW UP COMPLIANCE: keeps appointments   PHYSICAL EXAM:  BP (!) (P) 152/91 (Patient Position: Sitting)   Pulse (!) (P) 101   Temp (!) (P) 96.9 F (36.1 C) (Tympanic)   Wt (P) 231 lb 0.7 oz (104.8 kg)   BMI (P) 33.15 kg/m  Well-developed well-nourished patient in NAD. HEENT reveals PERLA, EOMI, discs not visualized.  Oral cavity is clear. No oral mucosal lesions are identified. Neck is clear without evidence of cervical or supraclavicular adenopathy. Lungs are clear to A&P. Cardiac examination is essentially unremarkable with regular rate and rhythm without murmur rub or thrill. Abdomen is benign with no organomegaly or masses noted. Motor sensory and DTR levels are equal and symmetric in the upper and lower extremities. Cranial nerves II through XII are grossly intact. Proprioception is intact. No peripheral adenopathy or edema is identified. No motor or sensory levels are noted. Crude visual fields are within normal range.  RADIOLOGY RESULTS: CT scan has been scheduled for July  PLAN: Present time patient is doing well 1 month out from concurrent chemoradiation.  I am pleased with her overall progress.  I have asked to see her back in August after her CT  scan is performed.  Patient knows to call at anytime with any concerns.  I am pleased with her excellent quality of life at this time.  I would like to take this opportunity to thank you for allowing me to participate in the care of your patient.Noreene Filbert, MD

## 2018-10-02 ENCOUNTER — Other Ambulatory Visit: Payer: Self-pay | Admitting: Physician Assistant

## 2018-10-02 DIAGNOSIS — K2100 Gastro-esophageal reflux disease with esophagitis, without bleeding: Secondary | ICD-10-CM

## 2018-10-21 NOTE — Progress Notes (Signed)
Patient: Cathy Watts Female    DOB: 30-Aug-1945   73 y.o.   MRN: [id number] Visit Date: 10/22/2018  Today's Provider: Mar Daring, PA-C   Chief Complaint  Patient presents with  . Gastroesophageal Reflux  . COPD  . Depression  . Menopause  . Cancer    Lung  . Rash   Subjective:     Rash The current episode started in the past 7 days. The affected locations include the face. The rash is characterized by redness, dryness and itchiness. She was exposed to nothing. Pertinent negatives include no fatigue, fever, shortness of breath or vomiting.     Gastroesophageal reflux disease with esophagitis From 05/01/2018-increased omeprazole to 40mg  once daily from 20mg  once daily. Call if no improvements. May require BID dosing.   Cancer of lower lobe of right lung ([place]) From 05/01/2018-Most recent CT scan shows a new hilar lymphadenopathy. Will f/u with Dr. Rogue Bussing on 05/14/18 to discuss next steps.   COPD, moderate ([place]) From 05/01/2018-no changes.  Avitaminosis D From 05/01/2018-labs checked, no changes.   Recurrent major depressive disorder, in partial remission ([place]) From 05/01/2018-Continue lexapro 20mg . Added wellbutrin 150mg  XR and Buspar 30mg  BID also to be continued.  Postmenopausal estrogen deficiency From 05/01/2018-DG Bone Density obtained. Showing osteoporosis.  Abdominal aortic aneurysm (AAA) 3.0 cm to 5.0 cm in diameter in female [facility]) From 05/01/2018-Followed by Dr. Delana Meyer. Due for repeat imaging in March 2020.    Allergies  Allergen Reactions  . Amoxicillin Hives and Itching    Did it involve swelling of the face/tongue/throat, SOB, or low BP? No Did it involve sudden or severe rash/hives, skin peeling, or any reaction on the inside of your mouth or nose? No Did you need to seek medical attention at a hospital or doctor's office? No When did it last happen?2018 If all above answers are "NO", may proceed with cephalosporin use.         Current Outpatient Medications:  .  albuterol (PROAIR HFA) 108 (90 Base) MCG/ACT inhaler, Inhale 2 puffs into the lungs every 4 (four) hours as needed for wheezing or shortness of breath., Disp: 1 each, Rfl: 2 .  buPROPion (WELLBUTRIN XL) 150 MG 24 hr tablet, Take 1 tablet by mouth once daily, Disp: 90 tablet, Rfl: 1 .  busPIRone (BUSPAR) 30 MG tablet, TAKE 1 TABLET BY MOUTH TWICE DAILY (Patient taking differently: Take 30 mg by mouth 2 (two) times daily. ), Disp: 60 tablet, Rfl: 11 .  CALCIUM CARBONATE-VIT D-MIN PO, Take 1 tablet by mouth 2 (two) times daily. , Disp: , Rfl:  .  clotrimazole-betamethasone (LOTRISONE) cream, 1 (one) Cream Cream Apply twice daily to affected area.  Not for Internal use. (Patient taking differently: Apply 1 application topically daily as needed (rash). 1 (one) Cream Cream Apply twice daily to affected area.  Not for Internal use.), Disp: 45 g, Rfl: 1 .  escitalopram (LEXAPRO) 20 MG tablet, TAKE 1 TABLET BY MOUTH ONCE DAILY (Patient taking differently: Take 20 mg by mouth daily. ), Disp: 90 tablet, Rfl: 1 .  Fluticasone-Umeclidin-Vilant (TRELEGY ELLIPTA) 100-62.5-25 MCG/INH AEPB, Inhale 1 puff into the lungs daily., Disp: 180 each, Rfl: 3 .  lidocaine-prilocaine (EMLA) cream, Apply 1 application topically as needed., Disp: 30 g, Rfl: 0 .  Magnesium 250 MG TABS, Take 250 mg by mouth daily., Disp: , Rfl:  .  Omega-3 Fatty Acids (FISH OIL PO), Take 800 mg by mouth 2 (two) times daily. , Disp: ,  Rfl:  .  omeprazole (PRILOSEC) 40 MG capsule, Take 1 capsule by mouth once daily, Disp: 90 capsule, Rfl: 1 .  ondansetron (ZOFRAN) 8 MG tablet, Take 1 tablet (8 mg total) by mouth 3 (three) times daily as needed for nausea or vomiting., Disp: 30 tablet, Rfl: 2 .  polyethylene glycol powder (GLYCOLAX/MIRALAX) powder, TAKE 17 GRAMS OF POWDER MIXED IN 8 OUNCES OF WATER BY MOUTH ONCE A DAY. (Patient taking differently: Take 1 Container by mouth daily. TAKE 17 GRAMS OF POWDER  MIXED IN 8 OUNCES OF WATER BY MOUTH ONCE A DAY.), Disp: 255 g, Rfl: 3 .  pravastatin (PRAVACHOL) 20 MG tablet, TAKE 1 TABLET BY MOUTH AT BEDTIME, Disp: 90 tablet, Rfl: 1 .  prochlorperazine (COMPAZINE) 10 MG tablet, Take 1 tablet (10 mg total) by mouth every 6 (six) hours as needed for nausea or vomiting., Disp: 30 tablet, Rfl: 2 .  Respiratory Therapy Supplies (FLUTTER) DEVI, 1 each by Does not apply route QID., Disp: 1 each, Rfl: 0 .  rOPINIRole (REQUIP) 3 MG tablet, TAKE 1 TABLET BY MOUTH AT BEDTIME, Disp: 90 tablet, Rfl: 1 .  sucralfate (CARAFATE) 1 g tablet, Take 1 tablet (1 g total) by mouth 3 (three) times daily. Dissolve in 3-4 tbsp warm water, swish and swallow., Disp: 90 tablet, Rfl: 3  Review of Systems  Constitutional: Negative for appetite change, chills, fatigue and fever.  Respiratory: Negative for chest tightness and shortness of breath.   Cardiovascular: Positive for leg swelling. Negative for chest pain and palpitations.  Gastrointestinal: Negative for abdominal pain, nausea and vomiting.  Skin: Positive for rash.  Neurological: Negative for dizziness and weakness.    Social History   Tobacco Use  . Smoking status: Former Smoker    Packs/day: 1.00    Years: 55.00    Pack years: 55.00    Types: Cigarettes    Quit date: 11/02/2017    Years since quitting: 0.9  . Smokeless tobacco: Never Used  . Tobacco comment: used to use E-cig  Substance Use Topics  . Alcohol use: Yes    Alcohol/week: 0.0 standard drinks    Comment: occasionally- wine      Objective:   BP (!) 142/94 (BP Location: Right Arm, Patient Position: Sitting, Cuff Size: Normal)   Pulse 92   Temp 98.1 F (36.7 C) (Oral)   Wt 230 lb 6.4 oz (104.5 kg)   SpO2 97%   BMI 33.06 kg/m  Vitals:   10/22/18 1502  BP: (!) 142/94  Pulse: 92  Temp: 98.1 F (36.7 C)  TempSrc: Oral  SpO2: 97%  Weight: 230 lb 6.4 oz (104.5 kg)     Physical Exam Vitals signs reviewed.  Constitutional:      General: She  is not in acute distress.    Appearance: Normal appearance. She is well-developed. She is not ill-appearing or diaphoretic.  Neck:     Musculoskeletal: Normal range of motion and neck supple.     Thyroid: No thyromegaly.     Vascular: No JVD.     Trachea: No tracheal deviation.  Cardiovascular:     Rate and Rhythm: Normal rate and regular rhythm.     Pulses: Normal pulses.     Heart sounds: Normal heart sounds. No murmur. No friction rub. No gallop.   Pulmonary:     Effort: Pulmonary effort is normal. No respiratory distress.     Breath sounds: Rhonchi present. No wheezing or rales.  Musculoskeletal:     Right lower  leg: Edema (1+ non pitting) present.     Left lower leg: Edema (1+ non pitting) present.  Lymphadenopathy:     Cervical: No cervical adenopathy.  Skin:    Capillary Refill: Capillary refill takes less than 2 seconds.       Neurological:     General: No focal deficit present.     Mental Status: She is alert and oriented to person, place, and time. Mental status is at baseline.     Cranial Nerves: No cranial nerve deficit.     Motor: No weakness.     Gait: Gait normal.  Psychiatric:        Mood and Affect: Mood normal.        Behavior: Behavior normal.        Thought Content: Thought content normal.        Judgment: Judgment normal.         Assessment & Plan    1. Localized swelling of lower extremity Worsening swelling of the lower extremities. Labs have been unremarkable. Will start furosemide and potassium as below for fluid. F/u in 4-8 weeks.  - furosemide (LASIX) 20 MG tablet; Take 1 tablet (20 mg total) by mouth daily.  Dispense: 30 tablet; Refill: 3 - potassium chloride SA (K-DUR) 20 MEQ tablet; Take 1 tablet (20 mEq total) by mouth daily.  Dispense: 30 tablet; Refill: 3  2. Eczema, unspecified type Suspect eczema of the forehead, most likely from chemotherapy for her lung cancer and wearing a wig. Will use triamcinolone as below. Call if not improving.   - triamcinolone cream (KENALOG) 0.1 %; Apply 1 application topically 2 (two) times daily.  Dispense: 45 g; Refill: 0     Mar Daring, PA-C  [facility]

## 2018-10-22 ENCOUNTER — Other Ambulatory Visit: Payer: Self-pay

## 2018-10-22 ENCOUNTER — Encounter: Payer: Self-pay | Admitting: Physician Assistant

## 2018-10-22 ENCOUNTER — Ambulatory Visit (INDEPENDENT_AMBULATORY_CARE_PROVIDER_SITE_OTHER): Payer: Medicare Other | Admitting: Physician Assistant

## 2018-10-22 VITALS — BP 142/94 | HR 92 | Temp 98.1°F | Wt 230.4 lb

## 2018-10-22 DIAGNOSIS — L309 Dermatitis, unspecified: Secondary | ICD-10-CM | POA: Diagnosis not present

## 2018-10-22 DIAGNOSIS — M7989 Other specified soft tissue disorders: Secondary | ICD-10-CM

## 2018-10-22 MED ORDER — POTASSIUM CHLORIDE CRYS ER 20 MEQ PO TBCR: 20.0000 meq | EXTENDED_RELEASE_TABLET | Freq: Every day | ORAL | 3 refills | Status: DC

## 2018-10-22 MED ORDER — FUROSEMIDE 20 MG PO TABS: 20.0000 mg | ORAL_TABLET | Freq: Every day | ORAL | 3 refills | Status: DC

## 2018-10-22 MED ORDER — TRIAMCINOLONE ACETONIDE 0.1 % EX CREA: 1.0000 "application " | TOPICAL_CREAM | Freq: Two times a day (BID) | CUTANEOUS | 0 refills | Status: DC

## 2018-10-22 NOTE — Patient Instructions (Signed)
Furosemide tablets What is this medicine? FUROSEMIDE (fyoor [place] se mide) is a diuretic. It helps you make more urine and to lose salt and excess water from your body. This medicine is used to treat high blood pressure, and edema or swelling from heart, kidney, or liver disease. This medicine may be used for other purposes; ask your health care provider or pharmacist if you have questions. COMMON BRAND NAME(S): Active-Medicated Specimen Kit, Delone, Diuscreen, Lasix, RX Specimen Collection Kit, Specimen Collection Kit, URINX Medicated Specimen Collection What should I tell my health care provider before I take this medicine? They need to know if you have any of these conditions: -abnormal blood electrolytes -diarrhea or vomiting -gout -heart disease -kidney disease, small amounts of urine, or difficulty passing urine -liver disease -thyroid disease -an unusual or allergic reaction to furosemide, sulfa drugs, other medicines, foods, dyes, or preservatives -pregnant or trying to get pregnant -breast-feeding How should I use this medicine? Take this medicine by mouth with a glass of water. Follow the directions on the prescription label. You may take this medicine with or without food. If it upsets your stomach, take it with food or milk. Do not take your medicine more often than directed. Remember that you will need to pass more urine after taking this medicine. Do not take your medicine at a time of day that will cause you problems. Do not take at bedtime. Talk to your pediatrician regarding the use of this medicine in children. While this drug may be prescribed for selected conditions, precautions do apply. Overdosage: If you think you have taken too much of this medicine contact a poison control center or emergency room at once. NOTE: This medicine is only for you. Do not share this medicine with others. What if I miss a dose? If you miss a dose, take it as soon as you can. If it is almost time  for your next dose, take only that dose. Do not take double or extra doses. What may interact with this medicine? -aspirin and aspirin-like medicines -certain antibiotics -chloral hydrate -cisplatin -cyclosporine -digoxin -diuretics -laxatives -lithium -medicines for blood pressure -medicines that relax muscles for surgery -methotrexate -NSAIDs, medicines for pain and inflammation like ibuprofen, naproxen, or indomethacin -phenytoin -steroid medicines like prednisone or cortisone -sucralfate -thyroid hormones This list may not describe all possible interactions. Give your health care provider a list of all the medicines, herbs, non-prescription drugs, or dietary supplements you use. Also tell them if you smoke, drink alcohol, or use illegal drugs. Some items may interact with your medicine. What should I watch for while using this medicine? Visit your doctor or health care professional for regular checks on your progress. Check your blood pressure regularly. Ask your doctor or health care professional what your blood pressure should be, and when you should contact him or her. If you are a diabetic, check your blood sugar as directed. You may need to be on a special diet while taking this medicine. Check with your doctor. Also, ask how many glasses of fluid you need to drink a day. You must not get dehydrated. You may get drowsy or dizzy. Do not drive, use machinery, or do anything that needs mental alertness until you know how this drug affects you. Do not stand or sit up quickly, especially if you are an older patient. This reduces the risk of dizzy or fainting spells. Alcohol can make you more drowsy and dizzy. Avoid alcoholic drinks. This medicine can make you more sensitive  how this drug affects you. Do not stand or sit up quickly, especially if you are an older patient. This reduces the risk of dizzy or fainting spells. Alcohol can make you more drowsy and dizzy. Avoid alcoholic drinks.  This medicine can make you more sensitive to the sun. Keep out of the sun. If you cannot avoid being in the sun, wear protective clothing and use sunscreen. Do not use sun lamps or tanning beds/booths.  What side effects may I notice from receiving this  medicine?  Side effects that you should report to your doctor or health care professional as soon as possible:  -blood in urine or stools  -dry mouth  -fever or chills  -hearing loss or ringing in the ears  -irregular heartbeat  -muscle pain or weakness, cramps  -skin rash  -stomach upset, pain, or nausea  -tingling or numbness in the hands or feet  -unusually weak or tired  -vomiting or diarrhea  -yellowing of the eyes or skin  Side effects that usually do not require medical attention (report to your doctor or health care professional if they continue or are bothersome):  -headache  -loss of appetite  -unusual bleeding or bruising  This list may not describe all possible side effects. Call your doctor for medical advice about side effects. You may report side effects to FDA at 1-800-FDA-1088.  Where should I keep my medicine?  Keep out of the reach of children.  Store at room temperature between 15 and 30 degrees C (59 and 86 degrees F). Protect from light. Throw away any unused medicine after the expiration date.  NOTE: This sheet is a summary. It may not cover all possible information. If you have questions about this medicine, talk to your doctor, pharmacist, or health care provider.   2019 Elsevier/Gold Standard (2014-07-13 13:49:50)

## 2018-11-02 ENCOUNTER — Ambulatory Visit: Payer: Self-pay | Admitting: Physician Assistant

## 2018-11-16 ENCOUNTER — Other Ambulatory Visit: Payer: Self-pay

## 2018-11-16 ENCOUNTER — Ambulatory Visit
Admission: RE | Admit: 2018-11-16 | Discharge: 2018-11-16 | Disposition: A | Discharge status: Home / Self Care | Payer: Medicare Other | Source: Ambulatory Visit | Attending: Internal Medicine | Admitting: Internal Medicine

## 2018-11-16 DIAGNOSIS — C3402 Malignant neoplasm of left main bronchus: Secondary | ICD-10-CM | POA: Diagnosis not present

## 2018-11-16 LAB — POCT I-STAT CREATININE: Creatinine, Ser: 0.8 mg/dL (ref 0.44–1.00)

## 2018-11-16 MED ORDER — IOHEXOL 300 MG/ML  SOLN
75.0000 mL | Freq: Once | INTRAMUSCULAR | Status: AC | PRN
  Administered 2018-11-16: 75 mL via INTRAVENOUS

## 2018-11-17 ENCOUNTER — Inpatient Hospital Stay: Payer: Medicare Other | Attending: Internal Medicine

## 2018-11-17 ENCOUNTER — Inpatient Hospital Stay (HOSPITAL_BASED_OUTPATIENT_CLINIC_OR_DEPARTMENT_OTHER): Payer: Medicare Other | Admitting: Internal Medicine

## 2018-11-17 ENCOUNTER — Other Ambulatory Visit: Payer: Self-pay

## 2018-11-17 DIAGNOSIS — Z808 Family history of malignant neoplasm of other organs or systems: Secondary | ICD-10-CM

## 2018-11-17 DIAGNOSIS — Z85110 Personal history of malignant carcinoid tumor of bronchus and lung: Secondary | ICD-10-CM

## 2018-11-17 DIAGNOSIS — Z803 Family history of malignant neoplasm of breast: Secondary | ICD-10-CM

## 2018-11-17 DIAGNOSIS — J449 Chronic obstructive pulmonary disease, unspecified: Secondary | ICD-10-CM

## 2018-11-17 DIAGNOSIS — Z923 Personal history of irradiation: Secondary | ICD-10-CM | POA: Insufficient documentation

## 2018-11-17 DIAGNOSIS — Z9221 Personal history of antineoplastic chemotherapy: Secondary | ICD-10-CM | POA: Diagnosis not present

## 2018-11-17 DIAGNOSIS — D6481 Anemia due to antineoplastic chemotherapy: Secondary | ICD-10-CM

## 2018-11-17 DIAGNOSIS — C3402 Malignant neoplasm of left main bronchus: Secondary | ICD-10-CM

## 2018-11-17 DIAGNOSIS — Z8041 Family history of malignant neoplasm of ovary: Secondary | ICD-10-CM | POA: Insufficient documentation

## 2018-11-17 DIAGNOSIS — Z87891 Personal history of nicotine dependence: Secondary | ICD-10-CM

## 2018-11-17 DIAGNOSIS — Z800 Family history of malignant neoplasm of digestive organs: Secondary | ICD-10-CM

## 2018-11-17 DIAGNOSIS — Z95828 Presence of other vascular implants and grafts: Secondary | ICD-10-CM

## 2018-11-17 LAB — COMPREHENSIVE METABOLIC PANEL
ALT: 15 U/L (ref 0–44)
AST: 15 U/L (ref 15–41)
Albumin: 3.6 g/dL (ref 3.5–5.0)
Alkaline Phosphatase: 87 U/L (ref 38–126)
Anion gap: 11 (ref 5–15)
BUN: 15 mg/dL (ref 8–23)
CO2: 25 mmol/L (ref 22–32)
Calcium: 9.1 mg/dL (ref 8.9–10.3)
Chloride: 98 mmol/L (ref 98–111)
Creatinine, Ser: 0.79 mg/dL (ref 0.44–1.00)
GFR calc Af Amer: >60 mL/min (ref >60)
GFR calc non Af Amer: >60 mL/min (ref >60)
Glucose, Bld: 103 mg/dL — ABNORMAL HIGH (ref 70–99)
Potassium: 4.3 mmol/L (ref 3.5–5.1)
Sodium: 134 mmol/L — ABNORMAL LOW (ref 135–145)
Total Bilirubin: 0.6 mg/dL (ref 0.3–1.2)
Total Protein: 7.3 g/dL (ref 6.5–8.1)

## 2018-11-17 LAB — CBC WITH DIFFERENTIAL/PLATELET
Abs Immature Granulocytes: 0.07 10*3/uL (ref 0.00–0.07)
Basophils Absolute: 0.1 10*3/uL (ref 0.0–0.1)
Basophils Relative: 1 %
Eosinophils Absolute: 0.5 10*3/uL (ref 0.0–0.5)
Eosinophils Relative: 5 %
HCT: 34.9 % — ABNORMAL LOW (ref 36.0–46.0)
Hemoglobin: 11.4 g/dL — ABNORMAL LOW (ref 12.0–15.0)
Immature Granulocytes: 1 %
Lymphocytes Relative: 14 %
Lymphs Abs: 1.2 10*3/uL (ref 0.7–4.0)
MCH: 30.3 pg (ref 26.0–34.0)
MCHC: 32.7 g/dL (ref 30.0–36.0)
MCV: 92.8 fL (ref 80.0–100.0)
Monocytes Absolute: 1.1 10*3/uL — ABNORMAL HIGH (ref 0.1–1.0)
Monocytes Relative: 13 %
Neutro Abs: 5.5 10*3/uL (ref 1.7–7.7)
Neutrophils Relative %: 66 %
Platelets: 282 10*3/uL (ref 150–400)
RBC: 3.76 MIL/uL — ABNORMAL LOW (ref 3.87–5.11)
RDW: 14.7 % (ref 11.5–15.5)
WBC: 8.5 10*3/uL (ref 4.0–10.5)
nRBC: 0 % (ref 0.0–0.2)

## 2018-11-17 MED ORDER — HEPARIN SOD (PORK) LOCK FLUSH 100 UNIT/ML IV SOLN
500.0000 [IU] | Freq: Once | INTRAVENOUS | Status: AC
  Administered 2018-11-17: 11:00:00 500 [IU] via INTRAVENOUS

## 2018-11-17 MED ORDER — SODIUM CHLORIDE 0.9% FLUSH
10.0000 mL | Freq: Once | INTRAVENOUS | Status: AC
  Administered 2018-11-17: 11:00:00 10 mL via INTRAVENOUS
  Filled 2018-11-17: qty 10

## 2018-11-17 NOTE — Assessment & Plan Note (Addendum)
#  Left hilar- large cell neuroendocrine; limited stage.  TxN2-Stage III; s/p carbo-Etop q 3 w- with concurrent RT- finished may 2020. July 13th 2020- Improved left hilar mass; Left hilar changes; no evidence of recurrence.   # continue surveillance; will get scans in 4 months; will order at next visit.   # Mild anemia- 11.5- from chemo-/RT stable.   #COPD-continue inhalers. Stable.   # DISPOSITION: # follow up in 2 month/MD-labs- cbc/cmp/port flush- Dr.B  # I reviewed the blood work- with the patient in detail; also reviewed the imaging independently [as summarized above]; and with the patient in detail.

## 2018-11-17 NOTE — Progress Notes (Signed)
Patient had CT performed on 11/16/18 and does not offer any problems today.

## 2018-11-17 NOTE — Progress Notes (Signed)
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 NOV 15th CT NED  # DEC-JAN 2020-CT/PET-Left hilar/mediastinal shift large cell neuroendocrine; Limited stage.TxN2-Stage III  # 18th Feb 2020- carbo-Etop with RT [finished RT April 16th]; #4 cycle carbo-etop on 09/15/2018.   # COPD on 02 at night; hx of smoking.  --------------------------------------------------    DIAGNOSIS: #  RLL Lung ca-/Stage I; # Left Hilar cancer/stage III  GOALS: cure  CURRENT/MOST RECENT THERAPY: surveillaince    Malignant neoplasm of hilus of left lung ([place])  Cancer of lower lobe of right lung [facility])       [place] OFFICE PROGRESS NOTE  Patient Care Team: Mar Daring, [name], [name], RN as Registered Nurse  Cancer Staging Cancer of lower lobe of right lung [facility]) Staging form: Lung, AJCC 7th Edition - Clinical: No stage assigned - Unsigned    Oncology History Overview Note  # SEP 2016-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 NOV 15th CT NED  # DEC-JAN 2020-CT/PET-Left hilar/mediastinal shift large cell neuroendocrine; Limited stage.TxN2-Stage III  # 18th Feb 2020- carbo-Etop with RT [finished RT April 16th]; #4 cycle carbo-etop on 09/15/2018.   # COPD on 02 at night; hx of smoking.   --------------------------------------------------    DIAGNOSIS: #  RLL Lung ca-/Stage I; # Left Hilar cancer/stage III  GOALS: cure  CURRENT/MOST RECENT THERAPY: surveillaince    Malignant neoplasm of hilus of left lung ([place])  Cancer of lower lobe of right lung ([place])     INTERVAL HISTORY:  Cathy Watts 73 y.o.  female pleasant patient above history of left hilar large cell neuroendocrine-/limited stage-currently on concurrent chemoradiation is here for follow-up/review the results of the CT scan.  Patient appetite is good no weight loss no headaches.  Chronic mild shortness with chronic cough.  Not any worse.  Continue numbness.  Appetite is good.  No weight loss.  Review of Systems  Constitutional: Positive for malaise/fatigue. Negative for chills, diaphoresis, fever and weight loss.  HENT: Negative for nosebleeds and sore throat.   Eyes: Negative for double vision.  Respiratory: Positive for cough and shortness of breath. Negative for hemoptysis, sputum production and wheezing.   Cardiovascular: Negative for chest pain, palpitations, orthopnea and leg swelling.  Gastrointestinal: Negative for abdominal pain, blood in stool, constipation, diarrhea, heartburn, melena, nausea and vomiting.  Genitourinary: Negative for dysuria, frequency and urgency.  Musculoskeletal: Negative for back pain and joint pain.  Skin: Negative.  Negative for itching and rash.  Neurological: Negative for dizziness, tingling, focal weakness, weakness and headaches.  Endo/Heme/Allergies: Does not bruise/bleed easily.  Psychiatric/Behavioral: Negative for depression. The patient is not nervous/anxious and does not have insomnia.       PAST MEDICAL HISTORY :  Past Medical History:  Diagnosis Date  . Abdominal aortic aneurysm (AAA) 3.0 cm to 5.0 cm in diameter in female [facility]) 05/2018   being followed  by dr. Delana Meyer. will reultrasound in 1 year  . Anxiety   . Asthma   . Barrett's esophagus   .  COPD (chronic obstructive pulmonary disease) ([place])   . Depression   . GERD (gastroesophageal reflux disease)   . History of peptic ulcer disease   . Hypercholesterolemia   . Low oxygen saturation    2l hs  . Osteopenia   . Panic disorder   . Personal history of radiation therapy   . Personal history of tobacco use, presenting hazards to health 01/16/2015  . Requires supplemental oxygen 05/2018   supposed to use 2L NP at night but she currently does not.  encouraged patient to resume this  . Sleep apnea    snores significantly but has never been tested for OSA  . SOB (shortness of breath)   . Squamous cell carcinoma of right lung ([place]) 2015    PAST SURGICAL HISTORY :   Past Surgical History:  Procedure Laterality Date  . CERVICAL CONE BIOPSY  1990  . CHOLECYSTECTOMY  1980's  . colonoscopy  2014  . COLONOSCOPY  07/21/2012  . COLONOSCOPY WITH PROPOFOL N/A 05/12/2018   Procedure: COLONOSCOPY WITH PROPOFOL;  Surgeon: Lollie Sails, MD;  Location: [facility] ENDOSCOPY;  Service: Endoscopy;  Laterality: N/A;  . ELBOW FRACTURE SURGERY Right 2009  . ENDOBRONCHIAL ULTRASOUND N/A 02/07/2015   Procedure: ENDOBRONCHIAL ULTRASOUND;  Surgeon: Flora Lipps, MD;  Location: [facility] ORS;  Service: Cardiopulmonary;  Laterality: N/A;  . ENDOBRONCHIAL ULTRASOUND Left 06/05/2018   Procedure: ENDOBRONCHIAL ULTRASOUND;  Surgeon: Flora Lipps, MD;  Location: [facility] ORS;  Service: Cardiopulmonary;  Laterality: Left;  . ESOPHAGOGASTRODUODENOSCOPY (EGD) WITH PROPOFOL N/A 02/26/2016   Procedure: ESOPHAGOGASTRODUODENOSCOPY (EGD) WITH PROPOFOL;  Surgeon: Lollie Sails, MD;  Location: [facility] ENDOSCOPY;  Service: Endoscopy;  Laterality: N/A;  COPD, Sleep Apnea  . ESOPHAGOGASTRODUODENOSCOPY (EGD) WITH PROPOFOL N/A 05/12/2018   Procedure: ESOPHAGOGASTRODUODENOSCOPY (EGD) WITH PROPOFOL;  Surgeon: Lollie Sails, MD;  Location: [facility] ENDOSCOPY;  Service: Endoscopy;  Laterality: N/A;  . EYE SURGERY Bilateral    cataract  extractions  . FRACTURE SURGERY  2005   elbow repair  . PORTA CATH INSERTION N/A 06/17/2018   Procedure: PORTA CATH INSERTION;  Surgeon: Katha Cabal, MD;  Location: [place] CV LAB;  Service: Cardiovascular;  Laterality: N/A;  . TONSILLECTOMY  1979  . TUBAL LIGATION  1970's  . UPPER GI ENDOSCOPY      FAMILY HISTORY :   Family History  Problem Relation Age of Onset  . Colon cancer Mother        colon  . Diabetes Mother   . Arthritis Mother   . Glaucoma Mother   . Stomach cancer Father        stomach  . Throat cancer Brother        throat  . Breast cancer Sister        breast  . Diabetes Sister   . Cirrhosis Sister   . Cancer Paternal Aunt   . Parkinson's disease Sister   . Heart attack Sister   . Ovarian cancer Sister     SOCIAL HISTORY:   Social History   Tobacco Use  . Smoking status: Former Smoker    Packs/day: 1.00    Years: 55.00    Pack years: 55.00    Types: Cigarettes    Quit date: 11/02/2017    Years since quitting: 1.0  . Smokeless tobacco: Never Used  . Tobacco comment: used to use E-cig  Substance  Use Topics  . Alcohol use: Yes    Alcohol/week: 0.0 standard drinks    Comment: occasionally- wine  . Drug use: No    ALLERGIES:  is allergic to amoxicillin.  MEDICATIONS:  Current Outpatient Medications  Medication Sig Dispense Refill  . albuterol (PROAIR HFA) 108 (90 Base) MCG/ACT inhaler Inhale 2 puffs into the lungs every 4 (four) hours as needed for wheezing or shortness of breath. 1 each 2  . buPROPion (WELLBUTRIN XL) 150 MG 24 hr tablet Take 1 tablet by mouth once daily 90 tablet 1  . busPIRone (BUSPAR) 30 MG tablet TAKE 1 TABLET BY MOUTH TWICE DAILY (Patient taking differently: Take 30 mg by mouth 2 (two) times daily. ) 60 tablet 11  . CALCIUM CARBONATE-VIT D-MIN PO Take 1 tablet by mouth 2 (two) times daily.     . clotrimazole-betamethasone (LOTRISONE) cream 1 (one) Cream Cream Apply twice daily to affected area.  Not for Internal  use. (Patient taking differently: Apply 1 application topically daily as needed (rash). 1 (one) Cream Cream Apply twice daily to affected area.  Not for Internal use.) 45 g 1  . escitalopram (LEXAPRO) 20 MG tablet TAKE 1 TABLET BY MOUTH ONCE DAILY (Patient taking differently: Take 20 mg by mouth daily. ) 90 tablet 1  . Fluticasone-Umeclidin-Vilant (TRELEGY ELLIPTA) 100-62.5-25 MCG/INH AEPB Inhale 1 puff into the lungs daily. 180 each 3  . furosemide (LASIX) 20 MG tablet Take 1 tablet (20 mg total) by mouth daily. 30 tablet 3  . lidocaine-prilocaine (EMLA) cream Apply 1 application topically as needed. 30 g 0  . Magnesium 250 MG TABS Take 250 mg by mouth daily.    . Omega-3 Fatty Acids (FISH OIL PO) Take 800 mg by mouth 2 (two) times daily.     Marland Kitchen omeprazole (PRILOSEC) 40 MG capsule Take 1 capsule by mouth once daily 90 capsule 1  . ondansetron (ZOFRAN) 8 MG tablet Take 1 tablet (8 mg total) by mouth 3 (three) times daily as needed for nausea or vomiting. 30 tablet 2  . polyethylene glycol powder (GLYCOLAX/MIRALAX) powder TAKE 17 GRAMS OF POWDER MIXED IN 8 OUNCES OF WATER BY MOUTH ONCE A DAY. (Patient taking differently: Take 1 Container by mouth daily. TAKE 17 GRAMS OF POWDER MIXED IN 8 OUNCES OF WATER BY MOUTH ONCE A DAY.) 255 g 3  . potassium chloride SA (K-DUR) 20 MEQ tablet Take 1 tablet (20 mEq total) by mouth daily. 30 tablet 3  . pravastatin (PRAVACHOL) 20 MG tablet TAKE 1 TABLET BY MOUTH AT BEDTIME 90 tablet 1  . prochlorperazine (COMPAZINE) 10 MG tablet Take 1 tablet (10 mg total) by mouth every 6 (six) hours as needed for nausea or vomiting. 30 tablet 2  . Respiratory Therapy Supplies (FLUTTER) DEVI 1 each by Does not apply route QID. 1 each 0  . rOPINIRole (REQUIP) 3 MG tablet TAKE 1 TABLET BY MOUTH AT BEDTIME 90 tablet 1  . sucralfate (CARAFATE) 1 g tablet Take 1 tablet (1 g total) by mouth 3 (three) times daily. Dissolve in 3-4 tbsp warm water, swish and swallow. 90 tablet 3  .  triamcinolone cream (KENALOG) 0.1 % Apply 1 application topically 2 (two) times daily. 45 g 0   No current facility-administered medications for this visit.     PHYSICAL EXAMINATION: ECOG PERFORMANCE STATUS: 1 - Symptomatic but completely ambulatory  BP 132/79   Pulse 93   Temp 98.9 F (37.2 C)   Resp 20   Wt 228 lb  3.2 oz (103.5 kg)   BMI 32.74 kg/m   Filed Weights   11/17/18 1048  Weight: 228 lb 3.2 oz (103.5 kg)   Physical Exam  Constitutional: She is oriented to person, place, and time and well-developed, well-nourished, and in no distress.  She is alone.  Not on oxygen.  She is walking herself.  HENT:  Head: Normocephalic and atraumatic.  Mouth/Throat: Oropharynx is clear and moist. No oropharyngeal exudate.  Eyes: Pupils are equal, round, and reactive to light.  Neck: Normal range of motion. Neck supple.  Cardiovascular: Normal rate and regular rhythm.  Pulmonary/Chest: No respiratory distress. She has no wheezes.  Decreased air entry bilaterally.  Abdominal: Soft. Bowel sounds are normal. She exhibits no distension and no mass. There is no abdominal tenderness. There is no rebound and no guarding.  Musculoskeletal: Normal range of motion.        General: No tenderness or edema.  Neurological: She is alert and oriented to person, place, and time.  Skin: Skin is warm.  Psychiatric: Affect normal.      LABORATORY DATA:  I have reviewed the data as listed    Component Value Date/Time   NA 134 (L) 11/17/2018 1032   NA 140 05/01/2018 1116   K 4.3 11/17/2018 1032   CL 98 11/17/2018 1032   CO2 25 11/17/2018 1032   GLUCOSE 103 (H) 11/17/2018 1032   BUN 15 11/17/2018 1032   BUN 15 05/01/2018 1116   CREATININE 0.79 11/17/2018 1032   CALCIUM 9.1 11/17/2018 1032   PROT 7.3 11/17/2018 1032   PROT 6.7 05/01/2018 1116   ALBUMIN 3.6 11/17/2018 1032   ALBUMIN 4.2 05/01/2018 1116   AST 15 11/17/2018 1032   ALT 15 11/17/2018 1032   ALKPHOS 87 11/17/2018 1032    BILITOT 0.6 11/17/2018 1032   BILITOT 0.3 05/01/2018 1116   GFRNONAA >60 11/17/2018 1032   GFRAA >60 11/17/2018 1032    No results found for: SPEP, UPEP  Lab Results  Component Value Date   WBC 8.5 11/17/2018   NEUTROABS 5.5 11/17/2018   HGB 11.4 (L) 11/17/2018   HCT 34.9 (L) 11/17/2018   MCV 92.8 11/17/2018   PLT 282 11/17/2018      Chemistry      Component Value Date/Time   NA 134 (L) 11/17/2018 1032   NA 140 05/01/2018 1116   K 4.3 11/17/2018 1032   CL 98 11/17/2018 1032   CO2 25 11/17/2018 1032   BUN 15 11/17/2018 1032   BUN 15 05/01/2018 1116   CREATININE 0.79 11/17/2018 1032      Component Value Date/Time   CALCIUM 9.1 11/17/2018 1032   ALKPHOS 87 11/17/2018 1032   AST 15 11/17/2018 1032   ALT 15 11/17/2018 1032   BILITOT 0.6 11/17/2018 1032   BILITOT 0.3 05/01/2018 1116       RADIOGRAPHIC STUDIES: I have personally reviewed the radiological images as listed and agreed with the findings in the report. Ct Chest W Contrast  Result Date: 11/16/2018 CLINICAL DATA:  Right lower lobe squamous cell lung cancer in 2016. More recently with left hilar large-cell neuroendocrine tumor. EXAM: CT CHEST WITH CONTRAST TECHNIQUE: Multidetector CT imaging of the chest was performed during intravenous contrast administration. CONTRAST:  63mL OMNIPAQUE IOHEXOL 300 MG/ML  SOLN COMPARISON:  04/27/2018 FINDINGS: Cardiovascular: The heart size is normal. No substantial pericardial effusion. Coronary artery calcification is evident. Atherosclerotic calcification is noted in the wall of the thoracic aorta. Right Port-A-Cath tip is positioned  in the distal SVC. Mediastinum/Nodes: No mediastinal lymphadenopathy. There is no hilar lymphadenopathy. The esophagus has normal imaging features. There is no axillary lymphadenopathy. Lungs/Pleura: The central tracheobronchial airways are patent. Centrilobular and paraseptal emphysema evident. Similar appearance of right parahilar scarring compatible  with fibrosis. Interval development of interstitial and airspace opacity in the left parahilar lung, presumably a reflection of evolving scar from radiation therapy. No overtly suspicious pulmonary nodule or mass. No pleural effusion. Upper Abdomen: Tiny low-density lesion in the lateral segment left liver is stable and probably a cyst. Musculoskeletal: No worrisome lytic or sclerotic osseous abnormality.Stable appearance of compression deformity in a midthoracic vertebral body. IMPRESSION: 1. Interval development of patchy areas of interstitial and airspace opacity in the left parahilar lung, presumably reflecting sequelae of radiation therapy. Close attention on follow-up recommended. 2. 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 3.  Aortic Atherosclerois (ICD10-170.0) 4.  Emphysema. (OIZ12-W58.9) Electronically Signed   By: Misty Stanley M.D.   On: 11/16/2018 10:23     ASSESSMENT & PLAN:  Malignant neoplasm of hilus of left lung ([facility]) #Left hilar- large cell neuroendocrine; limited stage.  TxN2-Stage III; s/p carbo-Etop q 3 w- with concurrent RT- finished may 2020. July 13th 2020- Improved left hilar mass; Left hilar changes; no evidence of recurrence.   # continue surveillance; will get scans in 4 months; will order at next visit.   # Mild anemia- 11.5- from chemo-/RT stable.   #COPD-continue inhalers. Stable.   # DISPOSITION: # follow up in 2 month/MD-labs- cbc/cmp/port flush- Dr.B  # I reviewed the blood work- with the patient in detail; also reviewed the imaging independently [as summarized above]; and with the patient in detail.     Orders Placed This Encounter  Procedures  . CBC with Differential    Standing Status:   Future    Standing Expiration Date:   11/17/2019  . Comprehensive metabolic panel    Standing Status:   Future    Standing Expiration Date:   11/17/2019   All questions were answered. The patient knows to call the clinic with any problems,  questions or concerns.      Cammie Sickle, MD 11/17/2018 1:27 PM

## 2018-11-23 ENCOUNTER — Encounter: Payer: Self-pay | Admitting: Radiation Oncology

## 2018-11-23 ENCOUNTER — Other Ambulatory Visit: Payer: Self-pay | Admitting: *Deleted

## 2018-11-23 ENCOUNTER — Ambulatory Visit
Admission: RE | Admit: 2018-11-23 | Discharge: 2018-11-23 | Disposition: A | Discharge status: Home / Self Care | Payer: Medicare Other | Source: Ambulatory Visit | Attending: Radiation Oncology | Admitting: Radiation Oncology

## 2018-11-23 ENCOUNTER — Other Ambulatory Visit: Payer: Self-pay

## 2018-11-23 VITALS — BP 143/88 | HR 95 | Temp 99.1°F | Wt 229.5 lb

## 2018-11-23 DIAGNOSIS — C3412 Malignant neoplasm of upper lobe, left bronchus or lung: Secondary | ICD-10-CM

## 2018-11-23 NOTE — Progress Notes (Signed)
Radiation Oncology Follow up Note  Name: Cathy Watts   Date:   11/23/2018 MRN:  [id number] DOB: 09-07-1945    This 73 y.o. female presents to the clinic today for 3-1/2-year follow-up.  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  Seen today in routine follow-up she is doing well she is asymptomatic specifically denies cough hemoptysis chest tightness recent CT scan was somewhat concerning.  For the interval development of patchy areas of interstitial and airspace opacity in the left perihilar region which will be follow-up with a CT scan in 3 months.  COMPLICATIONS OF TREATMENT: none  FOLLOW UP COMPLIANCE: keeps appointments   PHYSICAL EXAM:  BP (!) 143/88 (BP Location: Right Arm, Patient Position: Sitting)   Pulse 95   Temp 99.1 F (37.3 C) (Tympanic)   Wt 229 lb 8 oz (104.1 kg)   BMI 32.93 kg/m  Well-developed well-nourished patient in NAD. HEENT reveals PERLA, EOMI, discs not visualized.  Oral cavity is clear. No oral mucosal lesions are identified. Neck is clear without evidence of cervical or supraclavicular adenopathy. Lungs are clear to A&P. Cardiac examination is essentially unremarkable with regular rate and rhythm without murmur rub or thrill. Abdomen is benign with no organomegaly or masses noted. Motor sensory and DTR levels are equal and symmetric in the upper and lower extremities. Cranial nerves II through XII are grossly intact. Proprioception is intact. No peripheral adenopathy or edema is identified. No motor or sensory levels are noted. Crude visual fields are within normal range.  RADIOLOGY RESULTS: CT scan and serial fashion reviewed compatible with above-stated findings  PLAN: Present time  patient is doing well we will follow-up shortly after her next CT scan.  This does not look like malignancy.  May be a late sequela radiation although it is somewhat unusual.  We will continue to observe.  Patient knows to call with any concerns.  12-month follow-up appointment was made.  I would like to take this opportunity to thank you for allowing me to participate in the care of your patient.Noreene Filbert, MD

## 2018-11-26 ENCOUNTER — Other Ambulatory Visit: Payer: Self-pay | Admitting: Physician Assistant

## 2018-11-26 DIAGNOSIS — F419 Anxiety disorder, unspecified: Secondary | ICD-10-CM

## 2018-11-28 ENCOUNTER — Other Ambulatory Visit: Payer: Self-pay | Admitting: Physician Assistant

## 2018-11-28 DIAGNOSIS — F419 Anxiety disorder, unspecified: Secondary | ICD-10-CM

## 2018-12-25 ENCOUNTER — Telehealth: Payer: Self-pay

## 2018-12-25 NOTE — Telephone Encounter (Signed)
Telephone call to patient but no answer so I left message and informed patient SCP was completed and I would like to mail her a copy and set up a Virtual visit for the SCP visit.  Patient returned call and left message to please mail her a copy of the SCP and I will call her the following week to go over SCP and treatment summary and discuss post cancer care.

## 2018-12-28 ENCOUNTER — Telehealth: Payer: Self-pay | Admitting: Internal Medicine

## 2018-12-28 MED ORDER — TRELEGY ELLIPTA 100-62.5-25 MCG/INH IN AEPB: 1.0000 | INHALATION_SPRAY | Freq: Every day | RESPIRATORY_TRACT | 3 refills | Status: AC

## 2018-12-28 NOTE — Telephone Encounter (Signed)
Called and pt, who is requesting that Rx for Trelegy be faxed to [facility].  Fax number is [phone] and pt I.D of [id number] needs to be included on fax cover sheet. Rx has been printed and placed in DK's folder.

## 2018-12-28 NOTE — Telephone Encounter (Signed)
Pt returned call. Call back# [phone]

## 2018-12-28 NOTE — Telephone Encounter (Signed)
Left message for pt

## 2019-01-01 ENCOUNTER — Other Ambulatory Visit: Payer: Self-pay

## 2019-01-01 NOTE — Telephone Encounter (Signed)
Still awaiting signature from DK.

## 2019-01-04 ENCOUNTER — Other Ambulatory Visit: Payer: Self-pay

## 2019-01-04 ENCOUNTER — Inpatient Hospital Stay: Payer: Medicare Other | Attending: Oncology | Admitting: Oncology

## 2019-01-04 DIAGNOSIS — R635 Abnormal weight gain: Secondary | ICD-10-CM

## 2019-01-04 NOTE — Progress Notes (Signed)
[place] note [facility]  [phone] [phone]  Patient Care Team: Rubye Beach as PCP - General (Family Medicine) Darleen Crocker, [name], RN as Registered Nurse   Name of the patient: Cathy Watts  [id number]  [birth year]   Date of visit: 01/04/19  CLINIC:  Survivorship  REASON FOR VISIT:  [place] visit & to address acute survivorship needs   Virtual Visit via Telephone Note  I connected with Cathy Watts on 01/04/19 at  2:00 PM EDT by telephone and verified that I am speaking with the correct person using two identifiers.  Location: Patient: Home Provider: Office   I discussed the limitations, risks, security and privacy concerns of performing an evaluation and management service by telephone and the availability of in person appointments. I also discussed with the patient that there may be a patient responsible charge related to this service. The patient expressed understanding and agreed to proceed.  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 NOV 15th CT NED  # DEC-JAN 2020-CT/PET-Left hilar/mediastinal shift large cell neuroendocrine; Limited stage.TxN2-Stage III  # 18th Feb 2020- carbo-Etop with RT [finished RT April 16th]; #4 cycle carbo-etop on 09/15/2018.   # COPD on 02 at night; hx of smoking.  --------------------------------------------------    DIAGNOSIS: #  RLL Lung ca-/Stage I; # Left Hilar cancer/stage III  GOALS: cure  CURRENT/MOST RECENT THERAPY: surveillaince    Malignant neoplasm of hilus of left lung ([place])  Cancer of lower lobe of right lung ([place])    INTERVAL HISTORY: She was last evaluated by Dr. Rogue Bussing  for surveillance and follow-up.  Completed carbo etoposide with concurrent radiation and May 2020.  Imaging from July 2020 showed improved left hilar mass.  No evidence of recurrence.  Has intermittent shortness of breath due to COPD.  Stable on inhalers.  She has been doing well since her last visit.  She is staying active and healthy at home.  She is concerned about approximately 30 pound weight gain since quitting smoking and active treatment with steroids.  She denies any acute shortness of breath, chest pain, diarrhea, constipation or urinary complaints.  Has intermittent cough and shortness of breath with exertion due to COPD.  She uses multiple inhalers with good response.  ADDITIONAL REVIEW OF SYSTEMS:  Review of Systems  Constitutional: Negative.  Negative for chills, fever, malaise/fatigue and weight loss.  HENT: Negative for congestion, ear pain and tinnitus.   Eyes: Negative.  Negative for blurred vision and double vision.  Respiratory: Negative.  Negative for cough, sputum production and shortness of breath.   Cardiovascular: Negative.  Negative for chest pain, palpitations and leg swelling.  Gastrointestinal: Negative.  Negative for abdominal pain, constipation, diarrhea, nausea and vomiting.  Genitourinary: Negative for dysuria, frequency and urgency.  Musculoskeletal: Negative for back pain and falls.  Skin: Negative.  Negative for rash.  Neurological: Negative.  Negative for weakness and headaches.  Endo/Heme/Allergies: Negative.  Does not bruise/bleed easily.  Psychiatric/Behavioral: Negative.  Negative for depression. The patient is not nervous/anxious and does not have insomnia.      PAST MEDICAL & SURGICAL HISTORY:  Past Medical History:  Diagnosis Date  . Abdominal aortic aneurysm (AAA) 3.0 cm to 5.0 cm in diameter in female [facility])  05/2018   being followed by dr. Delana Meyer. will reultrasound in 1 year  . Anxiety   . Asthma   . Barrett's esophagus   . COPD (chronic  obstructive pulmonary disease) ([place])   . Depression   . GERD (gastroesophageal reflux disease)   . History of peptic ulcer disease   . Hypercholesterolemia   . Low oxygen saturation    2l hs  . Osteopenia   . Panic disorder   . Personal history of radiation therapy   . Personal history of tobacco use, presenting hazards to health 01/16/2015  . Requires supplemental oxygen 05/2018   supposed to use 2L NP at night but she currently does not.  encouraged patient to resume this  . Sleep apnea    snores significantly but has never been tested for OSA  . SOB (shortness of breath)   . Squamous cell carcinoma of right lung ([place]) 2015   Past Surgical History:  Procedure Laterality Date  . CERVICAL CONE BIOPSY  1990  . CHOLECYSTECTOMY  1980's  . colonoscopy  2014  . COLONOSCOPY  07/21/2012  . COLONOSCOPY WITH PROPOFOL N/A 05/12/2018   Procedure: COLONOSCOPY WITH PROPOFOL;  Surgeon: Lollie Sails, MD;  Location: [facility] ENDOSCOPY;  Service: Endoscopy;  Laterality: N/A;  . ELBOW FRACTURE SURGERY Right 2009  . ENDOBRONCHIAL ULTRASOUND N/A 02/07/2015   Procedure: ENDOBRONCHIAL ULTRASOUND;  Surgeon: Flora Lipps, MD;  Location: [facility] ORS;  Service: Cardiopulmonary;  Laterality: N/A;  . ENDOBRONCHIAL ULTRASOUND Left 06/05/2018   Procedure: ENDOBRONCHIAL ULTRASOUND;  Surgeon: Flora Lipps, MD;  Location: [facility] ORS;  Service: Cardiopulmonary;  Laterality: Left;  . ESOPHAGOGASTRODUODENOSCOPY (EGD) WITH PROPOFOL N/A 02/26/2016   Procedure: ESOPHAGOGASTRODUODENOSCOPY (EGD) WITH PROPOFOL;  Surgeon: Lollie Sails, MD;  Location: [facility] ENDOSCOPY;  Service: Endoscopy;  Laterality: N/A;  COPD, Sleep Apnea  . ESOPHAGOGASTRODUODENOSCOPY (EGD) WITH PROPOFOL N/A 05/12/2018   Procedure: ESOPHAGOGASTRODUODENOSCOPY (EGD) WITH PROPOFOL;  Surgeon: Lollie Sails, MD;  Location: [facility] ENDOSCOPY;  Service: Endoscopy;  Laterality: N/A;  . EYE SURGERY Bilateral    cataract extractions  . FRACTURE SURGERY  2005   elbow  repair  . PORTA CATH INSERTION N/A 06/17/2018   Procedure: PORTA CATH INSERTION;  Surgeon: Katha Cabal, MD;  Location: [facility] CV LAB;  Service: Cardiovascular;  Laterality: N/A;  . TONSILLECTOMY  1979  . TUBAL LIGATION  1970's  . UPPER GI ENDOSCOPY      SOCIAL HISTORY:   CURRENT MEDICATIONS:  Current Outpatient Medications on File Prior to Visit  Medication Sig Dispense Refill  . albuterol (PROAIR HFA) 108 (90 Base) MCG/ACT inhaler Inhale 2 puffs into the lungs every 4 (four) hours as needed for wheezing or shortness of breath. 1 each 2  . buPROPion (WELLBUTRIN XL) 150 MG 24 hr tablet Take 1 tablet by mouth once daily 90 tablet 1  . busPIRone (BUSPAR) 30 MG tablet Take 1 tablet by mouth twice daily 180 tablet 1  . CALCIUM CARBONATE-VIT D-MIN PO Take 1 tablet by mouth 2 (two) times daily.     . clotrimazole-betamethasone (LOTRISONE) cream 1 (one) Cream Cream Apply twice daily to affected area.  Not for Internal use. (Patient taking differently: Apply 1 application topically daily as needed (rash). 1 (one) Cream Cream Apply twice daily to affected area.  Not for Internal use.) 45 g 1  . escitalopram (LEXAPRO) 20 MG tablet Take 1 tablet (20 mg total) by mouth daily. 90 tablet 1  . Fluticasone-Umeclidin-Vilant (TRELEGY ELLIPTA) 100-62.5-25 MCG/INH AEPB Inhale 1 puff  into the lungs daily. 180 each 3  . furosemide (LASIX) 20 MG tablet Take 1 tablet (20 mg total) by mouth daily. 30 tablet 3  . lidocaine-prilocaine (EMLA) cream Apply 1 application topically as needed. 30 g 0  . Magnesium 250 MG TABS Take 250 mg by mouth daily.    . Omega-3 Fatty Acids (FISH OIL PO) Take 800 mg by mouth 2 (two) times daily.     Marland Kitchen omeprazole (PRILOSEC) 40 MG capsule Take 1 capsule by mouth once daily 90 capsule 1  . ondansetron (ZOFRAN) 8 MG tablet Take 1 tablet (8 mg total) by mouth 3 (three) times daily as needed for nausea or vomiting. 30 tablet 2  . polyethylene glycol powder (GLYCOLAX/MIRALAX)  powder TAKE 17 GRAMS OF POWDER MIXED IN 8 OUNCES OF WATER BY MOUTH ONCE A DAY. (Patient taking differently: Take 1 Container by mouth daily. TAKE 17 GRAMS OF POWDER MIXED IN 8 OUNCES OF WATER BY MOUTH ONCE A DAY.) 255 g 3  . potassium chloride SA (K-DUR) 20 MEQ tablet Take 1 tablet (20 mEq total) by mouth daily. 30 tablet 3  . pravastatin (PRAVACHOL) 20 MG tablet TAKE 1 TABLET BY MOUTH AT BEDTIME 90 tablet 1  . prochlorperazine (COMPAZINE) 10 MG tablet Take 1 tablet (10 mg total) by mouth every 6 (six) hours as needed for nausea or vomiting. 30 tablet 2  . Respiratory Therapy Supplies (FLUTTER) DEVI 1 each by Does not apply route QID. 1 each 0  . rOPINIRole (REQUIP) 3 MG tablet TAKE 1 TABLET BY MOUTH AT BEDTIME 90 tablet 1  . sucralfate (CARAFATE) 1 g tablet Take 1 tablet (1 g total) by mouth 3 (three) times daily. Dissolve in 3-4 tbsp warm water, swish and swallow. 90 tablet 3  . triamcinolone cream (KENALOG) 0.1 % Apply 1 application topically 2 (two) times daily. 45 g 0   No current facility-administered medications on file prior to visit.     ALLERGIES: Allergies  Allergen Reactions  . Amoxicillin Hives and Itching    Did it involve swelling of the face/tongue/throat, SOB, or low BP? No Did it involve sudden or severe rash/hives, skin peeling, or any reaction on the inside of your mouth or nose? No Did you need to seek medical attention at a hospital or doctor's office? No When did it last happen?2018 If all above answers are "NO", may proceed with cephalosporin use.        PHYSICAL EXAM:  There were no vitals filed for this visit. There were no vitals filed for this visit.  Physical Exam Constitutional:      Appearance: Normal appearance.     Comments: Weight gain  HENT:     Head: Normocephalic and atraumatic.  Eyes:     Pupils: Pupils are equal, round, and reactive to light.  Neck:     Musculoskeletal: Normal range of motion.  Cardiovascular:     Rate and Rhythm:  Normal rate and regular rhythm.     Heart sounds: Normal heart sounds. No murmur.  Pulmonary:     Effort: Pulmonary effort is normal.     Breath sounds: Normal breath sounds. No wheezing.     Comments: Cough-copd  Abdominal:     General: Bowel sounds are normal. There is no distension.     Palpations: Abdomen is soft.     Tenderness: There is no abdominal tenderness.  Musculoskeletal: Normal range of motion.  Skin:    General: Skin is warm and dry.  Findings: No rash.  Neurological:     Mental Status: She is alert and oriented to person, place, and time.  Psychiatric:        Judgment: Judgment normal.     LABORATORY DATA:  Lab Results  Component Value Date   WBC 8.5 11/17/2018   HGB 11.4 (L) 11/17/2018   HCT 34.9 (L) 11/17/2018   MCV 92.8 11/17/2018   PLT 282 11/17/2018     Chemistry      Component Value Date/Time   NA 134 (L) 11/17/2018 1032   NA 140 05/01/2018 1116   K 4.3 11/17/2018 1032   CL 98 11/17/2018 1032   CO2 25 11/17/2018 1032   BUN 15 11/17/2018 1032   BUN 15 05/01/2018 1116   CREATININE 0.79 11/17/2018 1032      Component Value Date/Time   CALCIUM 9.1 11/17/2018 1032   ALKPHOS 87 11/17/2018 1032   AST 15 11/17/2018 1032   ALT 15 11/17/2018 1032   BILITOT 0.6 11/17/2018 1032   BILITOT 0.3 05/01/2018 1116      DIAGNOSTIC IMAGING:  CT chest w contrast   IMPRESSION: 1. Interval development of patchy areas of interstitial and airspace opacity in the left parahilar lung, presumably reflecting sequelae of radiation therapy. Close attention on follow-up recommended. 2. 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 3.  Aortic Atherosclerois (ICD10-170.0) 4.  Emphysema. (TMH96-Q22.9)   ASSESSMENT & PLAN:  Cathy Watts is a pleasant 73 y.o. female/female with history of Stage 1a, treated with carbo/etopiside; completed treatment on .  Patient presents to survivorship clinic today for survivorship care plan visit and to  address any acute survivorship concerns since completing treatment in May 2020.   1. Stage 1alung cancer: Today, she received a copy of her Survivorship Care Plan [facility]) document, which was reviewed with him in detail.  The SCP details his cancer treatment history and potential late/long-term side effects of those treatments.  We discussed the follow-up schedule he can anticipate with interval imaging for surveillance of his cancer.  I have also shared a copy of his treatment summary/SCP with his PCP.  #. Problem at visit: Weight gain (> 30 lbs): Weight gain is thought to be secondary to quitting smoking and steroids with treatment.  Cathy Watts does enjoy cooking at home but needs help with recommendations on affordable and sustainable diet plan.  Cathy Watts is interested in the CARE program and referral to a dietician for healthy eating plans.  She has maintained her activity level but would like other locations for exercising other than her home.  She does not have a lot of room for walking or doing exercises.  #. Smoking cessation: I commended Cathy Watts continued efforts to remain tobacco-free.  We discussed that one of the most important risk reduction strategies in preventing cancer recurrence in lung cancer patients is smoking cessation.  He is committed to abstaining from tobacco.    #. Physical activity/Healthy eating: Getting adequate physical activity and maintaining a healthy diet as a cancer survivor is important for overall wellness and reduces the risk of cancer recurrence. We discussed the [facility], which is a fitness program that is offered to cancer survivors free of charge.  We also reviewed "The Nutrition Rainbow" handout, as well as the American Cancer Society's booklet with recommendations for nutrition and physical activity.    #. Health promotion/Cancer screening:  Cathy Watts is reportedly up-to-date on her colonoscopy, pap smear, skin screenings, and  vaccinations.  I encouraged her to talk with his PCP about arranging appropriate cancer screening tests, as appropriate.   #. Support services/Counseling: It is not uncommon for this period of the patient's cancer care trajectory to be one of many emotions and stressors.  Cathy Watts  was encouraged to take advantage of our many support services programs, support groups, and/or counseling in coping with her new life as a cancer survivor after completing anti-cancer treatment. He was given a calendar of the cancer center's support services events, as well as brochures for our spiritual care and free counseling resources.    Dispo:  -RTC on 01/18/19 for port flush and f/u with Dr. Rogue Bussing. -Ambulatory referral to [place] dietitian placed. -Return to survivorship clinic as needed; no additional follow-up needed at this time.  -Consider transitioning the patient to long-term survivorship, when clinically appropriate.   A total of 25 minutes was spent in face-to-face care of this patient, with greater than 50% of that time spent in counseling and care coordination.   Faythe Casa, NP, AGNP-[facility] [place] (work cell) [phone] (office) 01/04/19 2:16 PM

## 2019-01-04 NOTE — Progress Notes (Signed)
Survivorship Care Plan visit completed.  Treatment summary reviewed and previously mailed to patient.  ASCO answers booklet reviewed and given to patient.  CARE program and Cancer Transitions discussed with patient along with other resources cancer center offers to patients and caregivers.  Patient verbalized understanding.

## 2019-01-05 NOTE — Telephone Encounter (Signed)
Rx for trelegy has been faxed to [facility] patient assistance. Left detailed message to make pt aware.  Nothing further is needed.

## 2019-01-15 ENCOUNTER — Encounter: Payer: Self-pay | Admitting: Internal Medicine

## 2019-01-15 ENCOUNTER — Other Ambulatory Visit: Payer: Self-pay

## 2019-01-15 NOTE — Progress Notes (Signed)
Patient stated that she had been doing well with no concerns. 

## 2019-01-18 ENCOUNTER — Other Ambulatory Visit: Payer: Self-pay

## 2019-01-18 ENCOUNTER — Encounter: Payer: Self-pay | Admitting: Internal Medicine

## 2019-01-18 ENCOUNTER — Inpatient Hospital Stay (HOSPITAL_BASED_OUTPATIENT_CLINIC_OR_DEPARTMENT_OTHER): Payer: Medicare Other | Admitting: Internal Medicine

## 2019-01-18 ENCOUNTER — Inpatient Hospital Stay: Payer: Medicare Other

## 2019-01-18 ENCOUNTER — Inpatient Hospital Stay: Payer: Medicare Other | Attending: Internal Medicine

## 2019-01-18 DIAGNOSIS — J449 Chronic obstructive pulmonary disease, unspecified: Secondary | ICD-10-CM | POA: Insufficient documentation

## 2019-01-18 DIAGNOSIS — C3402 Malignant neoplasm of left main bronchus: Secondary | ICD-10-CM

## 2019-01-18 DIAGNOSIS — D649 Anemia, unspecified: Secondary | ICD-10-CM | POA: Diagnosis not present

## 2019-01-18 DIAGNOSIS — Z803 Family history of malignant neoplasm of breast: Secondary | ICD-10-CM | POA: Insufficient documentation

## 2019-01-18 DIAGNOSIS — Z8543 Personal history of malignant neoplasm of ovary: Secondary | ICD-10-CM | POA: Diagnosis not present

## 2019-01-18 DIAGNOSIS — Z95828 Presence of other vascular implants and grafts: Secondary | ICD-10-CM

## 2019-01-18 DIAGNOSIS — Z85110 Personal history of malignant carcinoid tumor of bronchus and lung: Secondary | ICD-10-CM | POA: Insufficient documentation

## 2019-01-18 DIAGNOSIS — Z87891 Personal history of nicotine dependence: Secondary | ICD-10-CM | POA: Insufficient documentation

## 2019-01-18 LAB — COMPREHENSIVE METABOLIC PANEL
ALT: 18 U/L (ref 0–44)
AST: 17 U/L (ref 15–41)
Albumin: 3.7 g/dL (ref 3.5–5.0)
Alkaline Phosphatase: 67 U/L (ref 38–126)
Anion gap: 8 (ref 5–15)
BUN: 13 mg/dL (ref 8–23)
CO2: 25 mmol/L (ref 22–32)
Calcium: 9.1 mg/dL (ref 8.9–10.3)
Chloride: 102 mmol/L (ref 98–111)
Creatinine, Ser: 0.63 mg/dL (ref 0.44–1.00)
GFR calc Af Amer: >60 mL/min (ref >60)
GFR calc non Af Amer: >60 mL/min (ref >60)
Glucose, Bld: 115 mg/dL — ABNORMAL HIGH (ref 70–99)
Potassium: 4 mmol/L (ref 3.5–5.1)
Sodium: 135 mmol/L (ref 135–145)
Total Bilirubin: 0.4 mg/dL (ref 0.3–1.2)
Total Protein: 6.7 g/dL (ref 6.5–8.1)

## 2019-01-18 LAB — CBC WITH DIFFERENTIAL/PLATELET
Abs Immature Granulocytes: 0.04 10*3/uL (ref 0.00–0.07)
Basophils Absolute: 0.1 10*3/uL (ref 0.0–0.1)
Basophils Relative: 1 %
Eosinophils Absolute: 0.3 10*3/uL (ref 0.0–0.5)
Eosinophils Relative: 4 %
HCT: 37.8 % (ref 36.0–46.0)
Hemoglobin: 11.9 g/dL — ABNORMAL LOW (ref 12.0–15.0)
Immature Granulocytes: 1 %
Lymphocytes Relative: 18 %
Lymphs Abs: 1.1 10*3/uL (ref 0.7–4.0)
MCH: 27.8 pg (ref 26.0–34.0)
MCHC: 31.5 g/dL (ref 30.0–36.0)
MCV: 88.3 fL (ref 80.0–100.0)
Monocytes Absolute: 0.8 10*3/uL (ref 0.1–1.0)
Monocytes Relative: 14 %
Neutro Abs: 3.8 10*3/uL (ref 1.7–7.7)
Neutrophils Relative %: 62 %
Platelets: 260 10*3/uL (ref 150–400)
RBC: 4.28 MIL/uL (ref 3.87–5.11)
RDW: 16.4 % — ABNORMAL HIGH (ref 11.5–15.5)
WBC: 6.1 10*3/uL (ref 4.0–10.5)
nRBC: 0 % (ref 0.0–0.2)

## 2019-01-18 MED ORDER — SODIUM CHLORIDE 0.9% FLUSH
10.0000 mL | Freq: Once | INTRAVENOUS | Status: AC
  Administered 2019-01-18: 10:00:00 10 mL via INTRAVENOUS
  Filled 2019-01-18: qty 10

## 2019-01-18 MED ORDER — HEPARIN SOD (PORK) LOCK FLUSH 100 UNIT/ML IV SOLN
500.0000 [IU] | Freq: Once | INTRAVENOUS | Status: AC
  Administered 2019-01-18: 500 [IU] via INTRAVENOUS

## 2019-01-18 NOTE — Progress Notes (Signed)
Nutrition  Patient on RD's schedule this am following MD appointment.  Was told by staff to call patient for visit.  Called patient this pm.  No answer. Left message on voicemail with call back number.    Amayrani Bennick B. Zenia Resides, [place], [place] Registered Dietitian [phone] (pager)
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
## 2019-01-18 NOTE — Assessment & Plan Note (Addendum)
#  Left hilar- large cell neuroendocrine; limited stage.  TxN2-Stage III; s/p carbo-Etop q 3 w- with concurrent RT- finished may 2020. July 13th 2020- Improved left hilar mass; Left hilar changes; no evidence of recurrence. STABLE.   # continue surveillance;  CT ordered by [facility] Nov 11th.    # Mild anemia- 11.9 from chemo-/RT -STABLE.    #COPD-continue inhalers. Stable.   # DISPOSITION: # follow up in 2 month/MD-labs- cbc/cmp/port flush/- Dr.B

## 2019-01-20 ENCOUNTER — Other Ambulatory Visit: Payer: Self-pay | Admitting: Physician Assistant

## 2019-01-20 DIAGNOSIS — M7989 Other specified soft tissue disorders: Secondary | ICD-10-CM

## 2019-01-20 MED ORDER — FUROSEMIDE 20 MG PO TABS: 20.0000 mg | ORAL_TABLET | Freq: Every day | ORAL | 1 refills | Status: DC

## 2019-01-20 NOTE — Telephone Encounter (Signed)
Pharmacy faxed refill request for the following medications:  furosemide (LASIX) 20 MG tablet   Please advise.

## 2019-02-12 ENCOUNTER — Other Ambulatory Visit: Payer: Self-pay | Admitting: Physician Assistant

## 2019-02-12 DIAGNOSIS — G2581 Restless legs syndrome: Secondary | ICD-10-CM

## 2019-03-08 ENCOUNTER — Other Ambulatory Visit: Payer: Self-pay | Admitting: Physician Assistant

## 2019-03-08 DIAGNOSIS — E7800 Pure hypercholesterolemia, unspecified: Secondary | ICD-10-CM

## 2019-03-08 DIAGNOSIS — Z1231 Encounter for screening mammogram for malignant neoplasm of breast: Secondary | ICD-10-CM

## 2019-03-08 NOTE — Telephone Encounter (Signed)
L.O.V. was 10/22/2018 and upcoming appointment is on 04/23/2019.

## 2019-03-15 ENCOUNTER — Telehealth: Payer: Self-pay | Admitting: *Deleted

## 2019-03-15 NOTE — Telephone Encounter (Signed)
Patient called and reports that she has been exposed to [place] and that she has a CT appointment on the 12th and a follow up appointment on the 16th with Dr B. Please advise

## 2019-03-16 ENCOUNTER — Other Ambulatory Visit: Payer: Self-pay | Admitting: *Deleted

## 2019-03-16 ENCOUNTER — Telehealth: Payer: Self-pay | Admitting: Internal Medicine

## 2019-03-16 ENCOUNTER — Other Ambulatory Visit: Payer: Self-pay

## 2019-03-16 DIAGNOSIS — Z20822 Contact with and (suspected) exposure to covid-19: Secondary | ICD-10-CM

## 2019-03-16 DIAGNOSIS — Z20828 Contact with and (suspected) exposure to other viral communicable diseases: Secondary | ICD-10-CM

## 2019-03-16 NOTE — Telephone Encounter (Signed)
Cathy Watts. I already spoke to the patient this morning. Scheduling already has a msg to r/s her apts per Dr. Sharmaine Base phone note.

## 2019-03-16 NOTE — Telephone Encounter (Signed)
Spoke to patient she had exposure to grandson who is currently admitted hospital for Covid related illness. Patient asymptomatic  Recommend Covid testing ASAP.   Recommend holding of the CT scan given Covid exposure/appointment with me.   C- Please re-schedule the CT scan in 2 weeks/ appt with me 1-2 days later.   Pt also has appt with Dr.Crystal. defer to kim/Anna.  --------------------------------------------------------  FYI-Ms.Burnette.

## 2019-03-16 NOTE — Telephone Encounter (Signed)
covid testing orders entered. Patient states that she can come at 1030 am today for the community testing.  Cathy Watts, patient is aware that you will be calling her with new apts for her scans and oncology/radiation apts.

## 2019-03-18 ENCOUNTER — Ambulatory Visit: Admission: RE | Admit: 2019-03-18 | Payer: Medicare Other | Source: Ambulatory Visit

## 2019-03-18 LAB — NOVEL CORONAVIRUS, NAA: SARS-CoV-2, NAA: NOT DETECTED

## 2019-03-22 ENCOUNTER — Ambulatory Visit: Payer: Medicare Other | Admitting: Internal Medicine

## 2019-03-22 ENCOUNTER — Other Ambulatory Visit: Payer: Medicare Other

## 2019-03-24 NOTE — Progress Notes (Signed)
Subjective:   Cathy Watts is a 73 y.o. female who presents for Medicare Annual (Subsequent) preventive examination.    This visit is being conducted through telemedicine due to the COVID-19 pandemic. This patient has given me verbal consent via doximity to conduct this visit, patient states they are participating from their home address. Some vital signs may be absent or patient reported.    Patient identification: identified by name, DOB, and current address  Review of Systems:  N/A  Cardiac Risk Factors include: advanced age (>66men, >57 women);dyslipidemia;smoking/ tobacco exposure     Objective:     Vitals: There were no vitals taken for this visit.  There is no height or weight on file to calculate BMI. Unable to obtain vitals due to visit being conducted via telephonically.   Advanced Directives 03/25/2019 01/15/2019 11/23/2018 09/30/2018 09/15/2018 08/11/2018 07/14/2018  Does Patient Have a Medical Advance Directive? Yes Yes Yes Yes Yes No;Yes No  Type of Paramedic of [place];Living will [place];Living will [place];Living will [place];Living will Living will;Healthcare Power of [place];Living will -  Does patient want to make changes to medical advance directive? - No - Patient declined No - Patient declined - No - Patient declined No - Patient declined No - Patient declined  Copy of [place] in Chart? No - copy requested No - copy requested No - copy requested - No - copy requested No - copy requested -  Would patient like information on creating a medical advance directive? - - - - - - -    Tobacco Social History   Tobacco Use  Smoking Status Former Smoker  . Packs/day: 1.00  . Years: 55.00  . Pack years: 55.00  . Types: Cigarettes  . Quit date: 11/02/2017  . Years since quitting: 1.3  Smokeless Tobacco Never Used     Counseling  given: Not Answered   Clinical Intake:  Pre-visit preparation completed: Yes  Pain : No/denies pain Pain Score: 0-No pain     Nutritional Risks: None Diabetes: No  How often do you need to have someone help you when you read instructions, pamphlets, or other written materials from your doctor or pharmacy?: 1 - Never  Interpreter Needed?: No  Information entered by :: [facility], LPN  Past Medical History:  Diagnosis Date  . Abdominal aortic aneurysm (AAA) 3.0 cm to 5.0 cm in diameter in female [facility]) 05/2018   being followed by dr. Delana Meyer. will reultrasound in 1 year  . Anxiety   . Asthma   . Barrett's esophagus   . COPD (chronic obstructive pulmonary disease) ([place])   . Depression   . GERD (gastroesophageal reflux disease)   . History of peptic ulcer disease   . Hypercholesterolemia   . Low oxygen saturation    2l hs  . Osteopenia   . Panic disorder   . Personal history of radiation therapy   . Personal history of tobacco use, presenting hazards to health 01/16/2015  . Requires supplemental oxygen 05/2018   supposed to use 2L NP at night but she currently does not.  encouraged patient to resume this  . Sleep apnea    snores significantly but has never been tested for OSA  . SOB (shortness of breath)   . 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 CERVICAL CONE BIOPSY  1990  .  CHOLECYSTECTOMY  1980's  . colonoscopy  2014  . COLONOSCOPY  07/21/2012  . COLONOSCOPY WITH PROPOFOL N/A 05/12/2018   Procedure: COLONOSCOPY WITH PROPOFOL;  Surgeon: Lollie Sails, MD;  Location: [facility] ENDOSCOPY;  Service: Endoscopy;  Laterality: N/A;  . ELBOW FRACTURE SURGERY Right 2009  . ENDOBRONCHIAL ULTRASOUND N/A 02/07/2015   Procedure: ENDOBRONCHIAL ULTRASOUND;  Surgeon: Flora Lipps, MD;  Location: [facility] ORS;  Service: Cardiopulmonary;  Laterality: N/A;  . ENDOBRONCHIAL ULTRASOUND Left 06/05/2018   Procedure: ENDOBRONCHIAL  ULTRASOUND;  Surgeon: Flora Lipps, MD;  Location: [facility] ORS;  Service: Cardiopulmonary;  Laterality: Left;  . ESOPHAGOGASTRODUODENOSCOPY (EGD) WITH PROPOFOL N/A 02/26/2016   Procedure: ESOPHAGOGASTRODUODENOSCOPY (EGD) WITH PROPOFOL;  Surgeon: Lollie Sails, MD;  Location: [facility] ENDOSCOPY;  Service: Endoscopy;  Laterality: N/A;  COPD, Sleep Apnea  . ESOPHAGOGASTRODUODENOSCOPY (EGD) WITH PROPOFOL N/A 05/12/2018   Procedure: ESOPHAGOGASTRODUODENOSCOPY (EGD) WITH PROPOFOL;  Surgeon: Lollie Sails, MD;  Location: [facility] ENDOSCOPY;  Service: Endoscopy;  Laterality: N/A;  . EYE SURGERY Bilateral    cataract extractions  . FRACTURE SURGERY  2005   elbow repair  . PORTA CATH INSERTION N/A 06/17/2018   Procedure: PORTA CATH INSERTION;  Surgeon: Katha Cabal, MD;  Location: [place] CV LAB;  Service: Cardiovascular;  Laterality: N/A;  . TONSILLECTOMY  1979  . TUBAL LIGATION  1970's  . UPPER GI ENDOSCOPY     Family History  Problem Relation Age of Onset  . Colon cancer Mother        colon  . Diabetes Mother   . Arthritis Mother   . Glaucoma Mother   . Stomach cancer Father        stomach  . Throat cancer Brother        throat  . Breast cancer Sister        breast  . Diabetes Sister   . Cirrhosis Sister   . Cancer Paternal Aunt   . Parkinson's disease Sister   . Heart attack Sister   . Ovarian cancer Sister    Social History   Socioeconomic History  . Marital status: Widowed    Spouse name: Not on file  . Number of children: 3  . Years of education: Not on file  . Highest education level: 12th grade  Occupational History  . 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 Financial resource strain: Not hard at all  . Food insecurity    Worry: Never true    Inability: Never true  . Transportation needs    Medical: No    Non-medical: No  Tobacco Use  . Smoking status: Former Smoker    Packs/day: 1.00    Years: 55.00    Pack  years: 55.00    Types: Cigarettes    Quit date: 11/02/2017    Years since quitting: 1.3  . Smokeless tobacco: Never Used  Substance and Sexual Activity  . Alcohol use: Yes    Alcohol/week: 0.0 standard drinks    Comment: occasionally- wine  . Drug use: No  . Sexual activity: Not Currently  Lifestyle  . Physical activity    Days per week: 0 days    Minutes per session: 0 min  . Stress: Not at all  Relationships  . Social Herbalist on phone: Patient refused    Gets together: Patient refused    Attends religious service: Patient refused    Active member of club or organization: Patient refused  Attends meetings of clubs or organizations: Patient refused    Relationship status: Patient refused  Other Topics Concern  . Not on file  Social History Narrative   Grandson lives with patient. Is available to help her as needed.    Outpatient Encounter Medications as of 03/25/2019  Medication Sig  . albuterol (PROAIR HFA) 108 (90 Base) MCG/ACT inhaler Inhale 2 puffs into the lungs every 4 (four) hours as needed for wheezing or shortness of breath.  Marland Kitchen buPROPion (WELLBUTRIN XL) 150 MG 24 hr tablet Take 1 tablet by mouth once daily  . busPIRone (BUSPAR) 30 MG tablet Take 1 tablet by mouth twice daily  . CALCIUM CARBONATE-VIT D-MIN PO Take 1 tablet by mouth 2 (two) times daily.   . clotrimazole-betamethasone (LOTRISONE) cream 1 (one) Cream Cream Apply twice daily to affected area.  Not for Internal use. (Patient taking differently: Apply 1 application topically daily as needed (rash). 1 (one) Cream Cream Apply twice daily to affected area.  Not for Internal use.)  . escitalopram (LEXAPRO) 20 MG tablet Take 1 tablet (20 mg total) by mouth daily.  . Fluticasone-Umeclidin-Vilant (TRELEGY ELLIPTA) 100-62.5-25 MCG/INH AEPB Inhale 1 puff into the lungs daily.  . furosemide (LASIX) 20 MG tablet Take 1 tablet (20 mg total) by mouth daily.  Marland Kitchen lidocaine-prilocaine (EMLA) cream Apply 1  application topically as needed.  . Magnesium 250 MG TABS Take 250 mg by mouth 2 (two) times daily.   Marland Kitchen omeprazole (PRILOSEC) 40 MG capsule Take 1 capsule by mouth once daily  . polyethylene glycol powder (GLYCOLAX/MIRALAX) powder TAKE 17 GRAMS OF POWDER MIXED IN 8 OUNCES OF WATER BY MOUTH ONCE A DAY. (Patient taking differently: Take 1 Container by mouth daily. TAKE 17 GRAMS OF POWDER MIXED IN 8 OUNCES OF WATER BY MOUTH ONCE A DAY.)  . potassium chloride SA (K-DUR) 20 MEQ tablet Take 1 tablet (20 mEq total) by mouth daily.  . pravastatin (PRAVACHOL) 20 MG tablet TAKE 1 TABLET BY MOUTH AT BEDTIME  . Respiratory Therapy Supplies (FLUTTER) DEVI 1 each by Does not apply route QID.  Marland Kitchen rOPINIRole (REQUIP) 3 MG tablet TAKE 1 TABLET BY MOUTH AT BEDTIME  . triamcinolone cream (KENALOG) 0.1 % Apply 1 application topically 2 (two) times daily.  . Flaxseed, Linseed, (FLAXSEED OIL) 1000 MG CAPS Take 1 capsule by mouth 2 (two) times daily.  . Omega-3 Fatty Acids (FISH OIL PO) Take 800 mg by mouth 2 (two) times daily.   . prochlorperazine (COMPAZINE) 10 MG tablet Take 1 tablet (10 mg total) by mouth every 6 (six) hours as needed for nausea or vomiting. (Patient not taking: Reported on 03/25/2019)   No facility-administered encounter medications on file as of 03/25/2019.     Activities of Daily Living In your present state of health, do you have any difficulty performing the following activities: 03/25/2019 06/17/2018  Hearing? N N  Vision? N N  Difficulty concentrating or making decisions? N N  Walking or climbing stairs? N N  Dressing or bathing? N N  Doing errands, shopping? N -  Preparing Food and eating ? N -  Using the Toilet? N -  In the past six months, have you accidently leaked urine? N -  Do you have problems with loss of bowel control? N -  Managing your Medications? N -  Managing your Finances? N -  Some recent data might be hidden    Patient Care Team: Rubye Beach as  PCP - General (Family Medicine)  Telford Nab, RN as Registered Nurse Cammie Sickle, MD as Consulting Physician (Internal Medicine) Flora Lipps, MD as Consulting Physician (Pulmonary Disease) Noreene Filbert, MD as Referring Physician (Radiation Oncology)    Assessment:   This is a routine wellness examination for Cathy Watts.  Exercise Activities and Dietary recommendations Current Exercise Habits: The patient does not participate in regular exercise at present, Exercise limited by: None identified  Goals    . DIET - EAT MORE FRUITS AND VEGETABLES     Recommend eating 2 servings of vegetables and fruits a day. Substitute for junk food.        Fall Risk: Fall Risk  03/25/2019 05/26/2017 04/24/2017 05/13/2016 05/13/2016  Falls in the past year? 0 No No No No  Number falls in past yr: 0 - - - -  Injury with Fall? 0 - - - -    FALL RISK PREVENTION PERTAINING TO THE HOME:  Any stairs in or around the home? Yes  If so, are there any without handrails? No   Home free of loose throw rugs in walkways, pet beds, electrical cords, etc? Yes  Adequate lighting in your home to reduce risk of falls? Yes   ASSISTIVE DEVICES UTILIZED TO PREVENT FALLS:  Life alert? No  Use of a cane, walker or w/c? No  Grab bars in the bathroom? No  Shower chair or bench in shower? No  Elevated toilet seat or a handicapped toilet? Yes    TIMED UP AND GO:  Was the test performed? No .    Depression Screen PHQ 2/9 Scores 03/25/2019 05/01/2018 05/26/2017 04/24/2017  PHQ - 2 Score 0 0 0 1  PHQ- 9 Score - 6 - -     Cognitive Function     6CIT Screen 03/25/2019 05/01/2018  What Year? 0 points 0 points  What month? 0 points 0 points  What time? 0 points 0 points  Count back from 20 0 points 0 points  Months in reverse 0 points 0 points  Repeat phrase 0 points 0 points  Total Score 0 0    Immunization History  Administered Date(s) Administered  . Influenza Split 04/11/2011, 04/15/2012,  04/02/2013  . Influenza, High Dose Seasonal PF 04/24/2016, 04/24/2017  . Influenza, Seasonal, Injecte, Preservative Fre 04/25/2015  . Influenza,inj,Quad PF,6+ Mos 04/20/2013  . Influenza-Unspecified 02/08/2014, 02/18/2018  . Pneumococcal Conjugate-13 04/22/2014  . Pneumococcal Polysaccharide-23 04/20/2013  . Pneumococcal-Unspecified 04/02/2013, 04/10/2014  . Tdap 11/24/2009  . Zoster 07/19/2013    Qualifies for Shingles Vaccine? Yes  Zostavax completed 07/19/13. Due for Shingrix. Pt has been advised to call insurance company to determine out of pocket expense. Advised may also receive vaccine at local pharmacy or Health Dept. Verbalized acceptance and understanding.  Tdap: Up to date  Flu Vaccine: Currently due. Pt to receive at next in office apt.   Pneumococcal Vaccine: Completed series  Screening Tests Health Maintenance  Topic Date Due  . INFLUENZA VACCINE  12/05/2018  . TETANUS/TDAP  11/25/2019  . MAMMOGRAM  03/13/2020  . DEXA SCAN  06/24/2020  . COLONOSCOPY  05/13/2023  . Hepatitis C Screening  Completed  . PNA vac Low Risk Adult  Completed    Cancer Screenings:  Colorectal Screening: Completed 05/12/18. Repeat every 5 years.  Mammogram: Completed 03/16/18.   Bone Density: Completed 06/24/18. Results reflect OSTEOPOROSIS. Repeat every 2 years.   Lung Cancer Screening: (Low Dose CT Chest recommended if Age 43-80 years, 30 pack-year currently smoking OR have quit w/in 15years.) does qualify  however had this completed 11/16/18. Repeat yearly.   Additional Screening:  Hepatitis C Screening: Up to date  Vision Screening: Recommended annual ophthalmology exams for early detection of glaucoma and other disorders of the eye.  Dental Screening: Recommended annual dental exams for proper oral hygiene  Community Resource Referral:  CRR required this visit?  No       Plan:  I have personally reviewed and addressed the Medicare Annual Wellness questionnaire and have noted  the following in the patient's chart:  A. Medical and social history B. Use of alcohol, tobacco or illicit drugs  C. Current medications and supplements D. Functional ability and status E.  Nutritional status F.  Physical activity G. Advance directives H. List of other physicians I.  Hospitalizations, surgeries, and ER visits in previous 12 months J.  [place] such as hearing and vision if needed, cognitive and depression L. Referrals and appointments   In addition, I have reviewed and discussed with patient certain preventive protocols, quality metrics, and best practice recommendations. A written personalized care plan for preventive services as well as general preventive health recommendations were provided to patient. Nurse Health Advisor  Signed,    Calob Baskette [place], [place]  [phone] Nurse Health Advisor   Nurse Notes: Pt to receive the flu shot at next in office apt.

## 2019-03-25 ENCOUNTER — Ambulatory Visit: Payer: Medicare Other | Admitting: Radiation Oncology

## 2019-03-25 ENCOUNTER — Ambulatory Visit (INDEPENDENT_AMBULATORY_CARE_PROVIDER_SITE_OTHER): Payer: Medicare Other

## 2019-03-25 ENCOUNTER — Other Ambulatory Visit: Payer: Self-pay

## 2019-03-25 DIAGNOSIS — Z0000 Encounter for general adult medical examination without abnormal findings: Secondary | ICD-10-CM

## 2019-03-25 NOTE — Progress Notes (Signed)
Duplicate/error

## 2019-03-25 NOTE — Patient Instructions (Signed)
Cathy Watts , Thank you for taking time to come for your Medicare Wellness Visit. I appreciate your ongoing commitment to your health goals. Please review the following plan we discussed and let me know if I can assist you in the future.   Screening recommendations/referrals: Colonoscopy: Up to date, due 05/2023 Mammogram: Up to date, due 03/2020 Bone Density: Up to date, due 06/2020 Recommended yearly ophthalmology/optometry visit for glaucoma screening and checkup Recommended yearly dental visit for hygiene and checkup  Vaccinations: Influenza vaccine: Currently due. Pt to receive at next in office apt.  Pneumococcal vaccine: Completed series Tdap vaccine: Up to date, due 11/2019 Shingles vaccine: Pt declines today.     Advanced directives: Please bring a copy of your POA (Power of Attorney) and/or Living Will to your next appointment.   Conditions/risks identified: Recommend to decrease junk food in daily diet and focus on eating healthier options such as fruits and vegetables.   Next appointment: 04/23/19 @ 2:00 PM with Fenton Malling. Declined scheduling an AWV for 2021 at this time.    Preventive Care 69 Years and Older, Female Preventive care refers to lifestyle choices and visits with your health care provider that can promote health and wellness. What does preventive care include?  A yearly physical exam. This is also called an annual well check.  Dental exams once or twice a year.  Routine eye exams. Ask your health care provider how often you should have your eyes checked.  Personal lifestyle choices, including:  Daily care of your teeth and gums.  Regular physical activity.  Eating a healthy diet.  Avoiding tobacco and drug use.  Limiting alcohol use.  Practicing safe sex.  Taking low-dose aspirin every day.  Taking vitamin and mineral supplements as recommended by your health care provider. What happens during an annual well check? The services and  screenings done by your health care provider during your annual well check will depend on your age, overall health, lifestyle risk factors, and family history of disease. Counseling  Your health care provider may ask you questions about your:  Alcohol use.  Tobacco use.  Drug use.  Emotional well-being.  Home and relationship well-being.  Sexual activity.  Eating habits.  History of falls.  Memory and ability to understand (cognition).  Work and work Statistician.  Reproductive health. Screening  You may have the following tests or measurements:  Height, weight, and BMI.  Blood pressure.  Lipid and cholesterol levels. These may be checked every 5 years, or more frequently if you are over 9 years old.  Skin check.  Lung cancer screening. You may have this screening every year starting at age 93 if you have a 30-pack-year history of smoking and currently smoke or have quit within the past 15 years.  Fecal occult blood test (FOBT) of the stool. You may have this test every year starting at age 72.  Flexible sigmoidoscopy or colonoscopy. You may have a sigmoidoscopy every 5 years or a colonoscopy every 10 years starting at age 31.  Hepatitis C blood test.  Hepatitis B blood test.  Sexually transmitted disease (STD) testing.  Diabetes screening. This is done by checking your blood sugar (glucose) after you have not eaten for a while (fasting). You may have this done every 1-3 years.  Bone density scan. This is done to screen for osteoporosis. You may have this done starting at age 63.  Mammogram. This may be done every 1-2 years. Talk to your health care provider about  how often you should have regular mammograms. Talk with your health care provider about your test results, treatment options, and if necessary, the need for more tests. Vaccines  Your health care provider may recommend certain vaccines, such as:  Influenza vaccine. This is recommended every year.   Tetanus, diphtheria, and acellular pertussis (Tdap, Td) vaccine. You may need a Td booster every 10 years.  Zoster vaccine. You may need this after age 78.  Pneumococcal 13-valent conjugate (PCV13) vaccine. One dose is recommended after age 57.  Pneumococcal polysaccharide (PPSV23) vaccine. One dose is recommended after age 70. Talk to your health care provider about which screenings and vaccines you need and how often you need them. This information is not intended to replace advice given to you by your health care provider. Make sure you discuss any questions you have with your health care provider. Document Released: 05/19/2015 Document Revised: 01/10/2016 Document Reviewed: 02/21/2015 Elsevier Interactive Patient Education  2017 [place] Prevention in the Home Falls can cause injuries. They can happen to people of all ages. There are many things you can do to make your home safe and to help prevent falls. What can I do on the outside of my home?  Regularly fix the edges of walkways and driveways and fix any cracks.  Remove anything that might make you trip as you walk through a door, such as a raised step or threshold.  Trim any bushes or trees on the path to your home.  Use bright outdoor lighting.  Clear any walking paths of anything that might make someone trip, such as rocks or tools.  Regularly check to see if handrails are loose or broken. Make sure that both sides of any steps have handrails.  Any raised decks and porches should have guardrails on the edges.  Have any leaves, snow, or ice cleared regularly.  Use sand or salt on walking paths during winter.  Clean up any spills in your garage right away. This includes oil or grease spills. What can I do in the bathroom?  Use night lights.  Install grab bars by the toilet and in the tub and shower. Do not use towel bars as grab bars.  Use non-skid mats or decals in the tub or shower.  If you need to sit  down in the shower, use a plastic, non-slip stool.  Keep the floor dry. Clean up any water that spills on the floor as soon as it happens.  Remove soap buildup in the tub or shower regularly.  Attach bath mats securely with double-sided non-slip rug tape.  Do not have throw rugs and other things on the floor that can make you trip. What can I do in the bedroom?  Use night lights.  Make sure that you have a light by your bed that is easy to reach.  Do not use any sheets or blankets that are too big for your bed. They should not hang down onto the floor.  Have a firm chair that has side arms. You can use this for support while you get dressed.  Do not have throw rugs and other things on the floor that can make you trip. What can I do in the kitchen?  Clean up any spills right away.  Avoid walking on wet floors.  Keep items that you use a lot in easy-to-reach places.  If you need to reach something above you, use a strong step stool that has a grab bar.  Keep  electrical cords out of the way.  Do not use floor polish or wax that makes floors slippery. If you must use wax, use non-skid floor wax.  Do not have throw rugs and other things on the floor that can make you trip. What can I do with my stairs?  Do not leave any items on the stairs.  Make sure that there are handrails on both sides of the stairs and use them. Fix handrails that are broken or loose. Make sure that handrails are as long as the stairways.  Check any carpeting to make sure that it is firmly attached to the stairs. Fix any carpet that is loose or worn.  Avoid having throw rugs at the top or bottom of the stairs. If you do have throw rugs, attach them to the floor with carpet tape.  Make sure that you have a light switch at the top of the stairs and the bottom of the stairs. If you do not have them, ask someone to add them for you. What else can I do to help prevent falls?  Wear shoes that:  Do not have  high heels.  Have rubber bottoms.  Are comfortable and fit you well.  Are closed at the toe. Do not wear sandals.  If you use a stepladder:  Make sure that it is fully opened. Do not climb a closed stepladder.  Make sure that both sides of the stepladder are locked into place.  Ask someone to hold it for you, if possible.  Clearly mark and make sure that you can see:  Any grab bars or handrails.  First and last steps.  Where the edge of each step is.  Use tools that help you move around (mobility aids) if they are needed. These include:  Canes.  Walkers.  Scooters.  Crutches.  Turn on the lights when you go into a dark area. Replace any light bulbs as soon as they burn out.  Set up your furniture so you have a clear path. Avoid moving your furniture around.  If any of your floors are uneven, fix them.  If there are any pets around you, be aware of where they are.  Review your medicines with your doctor. Some medicines can make you feel dizzy. This can increase your chance of falling. Ask your doctor what other things that you can do to help prevent falls. This information is not intended to replace advice given to you by your health care provider. Make sure you discuss any questions you have with your health care provider. Document Released: 02/16/2009 Document Revised: 09/28/2015 Document Reviewed: 05/27/2014 Elsevier Interactive Patient Education  2017 [employer].

## 2019-04-05 ENCOUNTER — Ambulatory Visit: Payer: Medicare Other

## 2019-04-06 ENCOUNTER — Ambulatory Visit: Payer: Medicare Other | Admitting: Internal Medicine

## 2019-04-06 ENCOUNTER — Other Ambulatory Visit: Payer: Medicare Other

## 2019-04-06 DIAGNOSIS — C3412 Malignant neoplasm of upper lobe, left bronchus or lung: Secondary | ICD-10-CM

## 2019-04-06 HISTORY — DX: Malignant neoplasm of upper lobe, left bronchus or lung: C34.12

## 2019-04-12 ENCOUNTER — Other Ambulatory Visit: Payer: Self-pay

## 2019-04-12 ENCOUNTER — Ambulatory Visit
Admission: RE | Admit: 2019-04-12 | Discharge: 2019-04-12 | Disposition: A | Discharge status: Home / Self Care | Payer: Medicare Other | Source: Ambulatory Visit | Attending: Radiation Oncology | Admitting: Radiation Oncology

## 2019-04-12 DIAGNOSIS — C3412 Malignant neoplasm of upper lobe, left bronchus or lung: Secondary | ICD-10-CM

## 2019-04-12 LAB — POCT I-STAT CREATININE: Creatinine, Ser: 0.9 mg/dL (ref 0.44–1.00)

## 2019-04-12 MED ORDER — IOHEXOL 300 MG/ML  SOLN
75.0000 mL | Freq: Once | INTRAMUSCULAR | Status: AC | PRN
  Administered 2019-04-12: 75 mL via INTRAVENOUS

## 2019-04-13 ENCOUNTER — Inpatient Hospital Stay: Payer: Medicare Other | Attending: Internal Medicine

## 2019-04-13 ENCOUNTER — Other Ambulatory Visit: Payer: Self-pay

## 2019-04-13 ENCOUNTER — Inpatient Hospital Stay: Payer: Medicare Other | Admitting: Internal Medicine

## 2019-04-13 DIAGNOSIS — J449 Chronic obstructive pulmonary disease, unspecified: Secondary | ICD-10-CM | POA: Diagnosis not present

## 2019-04-13 DIAGNOSIS — C7A090 Malignant carcinoid tumor of the bronchus and lung: Secondary | ICD-10-CM | POA: Insufficient documentation

## 2019-04-13 DIAGNOSIS — Z95828 Presence of other vascular implants and grafts: Secondary | ICD-10-CM

## 2019-04-13 DIAGNOSIS — C3402 Malignant neoplasm of left main bronchus: Secondary | ICD-10-CM

## 2019-04-13 DIAGNOSIS — J90 Pleural effusion, not elsewhere classified: Secondary | ICD-10-CM | POA: Diagnosis not present

## 2019-04-13 DIAGNOSIS — D649 Anemia, unspecified: Secondary | ICD-10-CM | POA: Diagnosis not present

## 2019-04-13 DIAGNOSIS — C3431 Malignant neoplasm of lower lobe, right bronchus or lung: Secondary | ICD-10-CM | POA: Insufficient documentation

## 2019-04-13 LAB — CBC WITH DIFFERENTIAL/PLATELET
Abs Immature Granulocytes: 0.01 10*3/uL (ref 0.00–0.07)
Basophils Absolute: 0 10*3/uL (ref 0.0–0.1)
Basophils Relative: 1 %
Eosinophils Absolute: 0.1 10*3/uL (ref 0.0–0.5)
Eosinophils Relative: 1 %
HCT: 40.3 % (ref 36.0–46.0)
Hemoglobin: 12.4 g/dL (ref 12.0–15.0)
Immature Granulocytes: 0 %
Lymphocytes Relative: 30 %
Lymphs Abs: 1.7 10*3/uL (ref 0.7–4.0)
MCH: 28 pg (ref 26.0–34.0)
MCHC: 30.8 g/dL (ref 30.0–36.0)
MCV: 91 fL (ref 80.0–100.0)
Monocytes Absolute: 1.1 10*3/uL — ABNORMAL HIGH (ref 0.1–1.0)
Monocytes Relative: 20 %
Neutro Abs: 2.8 10*3/uL (ref 1.7–7.7)
Neutrophils Relative %: 48 %
Platelets: 213 10*3/uL (ref 150–400)
RBC: 4.43 MIL/uL (ref 3.87–5.11)
RDW: 16.2 % — ABNORMAL HIGH (ref 11.5–15.5)
WBC: 5.8 10*3/uL (ref 4.0–10.5)
nRBC: 0 % (ref 0.0–0.2)

## 2019-04-13 LAB — COMPREHENSIVE METABOLIC PANEL
ALT: 22 U/L (ref 0–44)
AST: 20 U/L (ref 15–41)
Albumin: 3.8 g/dL (ref 3.5–5.0)
Alkaline Phosphatase: 52 U/L (ref 38–126)
Anion gap: 8 (ref 5–15)
BUN: 18 mg/dL (ref 8–23)
CO2: 24 mmol/L (ref 22–32)
Calcium: 8.5 mg/dL — ABNORMAL LOW (ref 8.9–10.3)
Chloride: 102 mmol/L (ref 98–111)
Creatinine, Ser: 0.88 mg/dL (ref 0.44–1.00)
GFR calc Af Amer: >60 mL/min (ref >60)
GFR calc non Af Amer: >60 mL/min (ref >60)
Glucose, Bld: 109 mg/dL — ABNORMAL HIGH (ref 70–99)
Potassium: 4 mmol/L (ref 3.5–5.1)
Sodium: 134 mmol/L — ABNORMAL LOW (ref 135–145)
Total Bilirubin: 0.4 mg/dL (ref 0.3–1.2)
Total Protein: 6.8 g/dL (ref 6.5–8.1)

## 2019-04-13 MED ORDER — SODIUM CHLORIDE 0.9% FLUSH
10.0000 mL | Freq: Once | INTRAVENOUS | Status: AC
  Administered 2019-04-13: 10:00:00 10 mL via INTRAVENOUS
  Filled 2019-04-13: qty 10

## 2019-04-13 MED ORDER — HEPARIN SOD (PORK) LOCK FLUSH 100 UNIT/ML IV SOLN
500.0000 [IU] | Freq: Once | INTRAVENOUS | Status: AC
  Administered 2019-04-13: 10:00:00 500 [IU] via INTRAVENOUS

## 2019-04-13 NOTE — Assessment & Plan Note (Addendum)
#  Left hilar- large cell neuroendocrine; limited stage.  TxN2-Stage III; s/p carbo-Etop q 3 w- with concurrent RT- finished may 2020.  December 7th 2020-left hilar consolidation changes noted likely from radiation; also right hilar consolidation noted-thought to be radiation changes.  Subcentimeter lung nodules which are again thought to be inflammatory/radiation.  #Reviewed the above imaging findings with the patient; again suspect radiation changes.  Clinically less likely from underlying malignancy.  Patient awaiting evaluation with Dr. Donella Stade this week.  We will discuss also tumor conference; and plan repeat imaging as per recommendations.   # Mild anemia- 11.9 from chemo-/RT -improved/resolved.   #Small bilateral effusion pleural-again likely reactive.  Monitor for now.  #COPD-on O2 at night prn. continue inhalers. Stable.   #Flu shot: Awaiting with PCP.  # DISPOSITION: Discuss the tumor conference/order follow-up imaging. # port flush in 2 months # follow up in 4 months- MD; labs- cbc/cmp;port flush-Dr.B  # I reviewed the blood work- with the patient in detail; also reviewed the imaging independently [as summarized above]; and with the patient in detail.

## 2019-04-13 NOTE — Progress Notes (Signed)
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 NOV 15th CT NED  # DEC-JAN 2020-CT/PET-Left hilar/mediastinal shift large cell neuroendocrine; Limited stage.TxN2-Stage III  # 18th Feb 2020- carbo-Etop with RT [finished RT April 16th]; #4 cycle carbo-etop on 09/15/2018.   # COPD on 02 at night; hx of smoking.  --------------------------------------------------    DIAGNOSIS: #  RLL Lung ca-/Stage I; # Left Hilar cancer/stage III  GOALS: cure  CURRENT/MOST RECENT THERAPY: surveillaince    Malignant neoplasm of hilus of left lung ([place])  Cancer of lower lobe of right lung [facility])       [place] OFFICE PROGRESS NOTE  Patient Care Team: Rubye Beach as PCP - General (Family Medicine) Telford Nab, RN as Registered Nurse Rogue Bussing, Elisha Headland, MD as Consulting Physician (Internal Medicine) Flora Lipps, MD as Consulting Physician (Pulmonary Disease) Noreene Filbert, MD as Referring Physician (Radiation Oncology)  Cancer Staging Cancer of lower lobe of right lung [facility]) Staging form: Lung, AJCC 7th Edition - Clinical: No stage assigned - Unsigned    Oncology History Overview Note  # SEP 2016-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 NOV 15th CT NED  # DEC-JAN  2020-CT/PET-Left hilar/mediastinal shift large cell neuroendocrine; Limited stage.TxN2-Stage III  # 18th Feb 2020- carbo-Etop with RT [finished RT April 16th]; #4 cycle carbo-etop on 09/15/2018.   # COPD on 02 at night; hx of smoking.  --------------------------------------------------    DIAGNOSIS: #  RLL Lung ca-/Stage I; # Left Hilar cancer/stage III  GOALS: cure  CURRENT/MOST RECENT THERAPY: surveillaince    Malignant neoplasm of hilus of left lung ([place])  Cancer of lower lobe of right lung ([place])     INTERVAL HISTORY:  Cathy Watts 73 y.o.  female pleasant patient above history of left hilar large cell neuroendocrine-/limited stage-currently on concurrent chemoradiation is here for follow-up/review results of the CT scan.  Patient has chronic shortness of her chronic cough.  Chronic fatigue.  Appetite is good.  No weight loss but no headaches.   Review of Systems  Constitutional: Positive for malaise/fatigue. Negative for chills, diaphoresis, fever and weight loss.  HENT: Negative for nosebleeds and sore throat.   Eyes: Negative for double vision.  Respiratory: Positive for cough and shortness of breath. Negative for hemoptysis, sputum production and wheezing.   Cardiovascular: Negative for chest pain, palpitations, orthopnea and leg swelling.  Gastrointestinal: Negative for abdominal pain, blood in stool, constipation, diarrhea, heartburn, melena, nausea and vomiting.  Genitourinary: Negative for dysuria, frequency and urgency.  Musculoskeletal: Negative for back pain and joint pain.  Skin: Negative.  Negative for itching and rash.  Neurological: Negative for dizziness, tingling, focal weakness, weakness and headaches.  Endo/Heme/Allergies: Does not bruise/bleed easily.  Psychiatric/Behavioral: Negative for depression. The patient is not nervous/anxious and does not have insomnia.       PAST MEDICAL HISTORY :  Past  Medical History:  Diagnosis Date  . Abdominal  aortic aneurysm (AAA) 3.0 cm to 5.0 cm in diameter in female [facility]) 05/2018   being followed by dr. Delana Meyer. will reultrasound in 1 year  . Anxiety   . Asthma   . Barrett's esophagus   . COPD (chronic obstructive pulmonary disease) ([place])   . Depression   . GERD (gastroesophageal reflux disease)   . History of peptic ulcer disease   . Hypercholesterolemia   . Low oxygen saturation    2l hs  . Osteopenia   . Panic disorder   . Personal history of radiation therapy   . Personal history of tobacco use, presenting hazards to health 01/16/2015  . Requires supplemental oxygen 05/2018   supposed to use 2L NP at night but she currently does not.  encouraged patient to resume this  . Sleep apnea    snores significantly but has never been tested for OSA  . SOB (shortness of breath)   . Squamous cell carcinoma of right lung ([place]) 2015    PAST SURGICAL HISTORY :   Past Surgical History:  Procedure Laterality Date  . CERVICAL CONE BIOPSY  1990  . CHOLECYSTECTOMY  1980's  . colonoscopy  2014  . COLONOSCOPY  07/21/2012  . COLONOSCOPY WITH PROPOFOL N/A 05/12/2018   Procedure: COLONOSCOPY WITH PROPOFOL;  Surgeon: Lollie Sails, MD;  Location: [facility] ENDOSCOPY;  Service: Endoscopy;  Laterality: N/A;  . ELBOW FRACTURE SURGERY Right 2009  . ENDOBRONCHIAL ULTRASOUND N/A 02/07/2015   Procedure: ENDOBRONCHIAL ULTRASOUND;  Surgeon: Flora Lipps, MD;  Location: [facility] ORS;  Service: Cardiopulmonary;  Laterality: N/A;  . ENDOBRONCHIAL ULTRASOUND Left 06/05/2018   Procedure: ENDOBRONCHIAL ULTRASOUND;  Surgeon: Flora Lipps, MD;  Location: [facility] ORS;  Service: Cardiopulmonary;  Laterality: Left;  . ESOPHAGOGASTRODUODENOSCOPY (EGD) WITH PROPOFOL N/A 02/26/2016   Procedure: ESOPHAGOGASTRODUODENOSCOPY (EGD) WITH PROPOFOL;  Surgeon: Lollie Sails, MD;  Location[facility] ENDOSCOPY;  Service: Endoscopy;  Laterality: N/A;  COPD, Sleep Apnea  . ESOPHAGOGASTRODUODENOSCOPY (EGD) WITH PROPOFOL N/A 05/12/2018   Procedure:  ESOPHAGOGASTRODUODENOSCOPY (EGD) WITH PROPOFOL;  Surgeon: Lollie Sails, MD;  Location: [facility] ENDOSCOPY;  Service: Endoscopy;  Laterality: N/A;  . EYE SURGERY Bilateral    cataract extractions  . FRACTURE SURGERY  2005   elbow repair  . PORTA CATH INSERTION N/A 06/17/2018   Procedure: PORTA CATH INSERTION;  Surgeon: Katha Cabal, MD;  Location: [place] CV LAB;  Service: Cardiovascular;  Laterality: N/A;  . TONSILLECTOMY  1979  . TUBAL LIGATION  1970's  . UPPER GI ENDOSCOPY      FAMILY HISTORY :   Family History  Problem Relation Age of Onset  . Colon cancer Mother        colon  . Diabetes Mother   . Arthritis Mother   . Glaucoma Mother   . Stomach cancer Father        stomach  . Throat cancer Brother        throat  . Breast cancer Sister        breast  . Diabetes Sister   . Cirrhosis Sister   . Cancer Paternal Aunt   . Parkinson's disease Sister   . Heart attack Sister   . Ovarian cancer Sister     SOCIAL HISTORY:   Social History   Tobacco Use  . Smoking status: Former Smoker    Packs/day: 1.00    Years: 55.00    Pack years: 55.00    Types: Cigarettes  Quit date: 11/02/2017    Years since quitting: 1.4  . Smokeless tobacco: Never Used  Substance Use Topics  . Alcohol use: Yes    Alcohol/week: 0.0 standard drinks    Comment: occasionally- wine  . Drug use: No    ALLERGIES:  is allergic to amoxicillin.  MEDICATIONS:  Current Outpatient Medications  Medication Sig Dispense Refill  . albuterol (PROAIR HFA) 108 (90 Base) MCG/ACT inhaler Inhale 2 puffs into the lungs every 4 (four) hours as needed for wheezing or shortness of breath. 1 each 2  . buPROPion (WELLBUTRIN XL) 150 MG 24 hr tablet Take 1 tablet by mouth once daily 90 tablet 1  . busPIRone (BUSPAR) 30 MG tablet Take 1 tablet by mouth twice daily 180 tablet 1  . CALCIUM CARBONATE-VIT D-MIN PO Take 1 tablet by mouth 2 (two) times daily.     . clotrimazole-betamethasone (LOTRISONE) cream  1 (one) Cream Cream Apply twice daily to affected area.  Not for Internal use. (Patient taking differently: Apply 1 application topically daily as needed (rash). 1 (one) Cream Cream Apply twice daily to affected area.  Not for Internal use.) 45 g 1  . escitalopram (LEXAPRO) 20 MG tablet Take 1 tablet (20 mg total) by mouth daily. 90 tablet 1  . Flaxseed, Linseed, (FLAXSEED OIL) 1000 MG CAPS Take 1 capsule by mouth 2 (two) times daily.    . Fluticasone-Umeclidin-Vilant (TRELEGY ELLIPTA) 100-62.5-25 MCG/INH AEPB Inhale 1 puff into the lungs daily. 180 each 3  . furosemide (LASIX) 20 MG tablet Take 1 tablet (20 mg total) by mouth daily. 90 tablet 1  . lidocaine-prilocaine (EMLA) cream Apply 1 application topically as needed. 30 g 0  . Magnesium 250 MG TABS Take 250 mg by mouth 2 (two) times daily.     . Omega-3 Fatty Acids (FISH OIL PO) Take 800 mg by mouth 2 (two) times daily.     Marland Kitchen omeprazole (PRILOSEC) 40 MG capsule Take 1 capsule by mouth once daily 90 capsule 1  . polyethylene glycol powder (GLYCOLAX/MIRALAX) powder TAKE 17 GRAMS OF POWDER MIXED IN 8 OUNCES OF WATER BY MOUTH ONCE A DAY. (Patient taking differently: Take 1 Container by mouth daily. TAKE 17 GRAMS OF POWDER MIXED IN 8 OUNCES OF WATER BY MOUTH ONCE A DAY.) 255 g 3  . potassium chloride SA (K-DUR) 20 MEQ tablet Take 1 tablet (20 mEq total) by mouth daily. 30 tablet 3  . pravastatin (PRAVACHOL) 20 MG tablet TAKE 1 TABLET BY MOUTH AT BEDTIME 90 tablet 0  . prochlorperazine (COMPAZINE) 10 MG tablet Take 1 tablet (10 mg total) by mouth every 6 (six) hours as needed for nausea or vomiting. 30 tablet 2  . Respiratory Therapy Supplies (FLUTTER) DEVI 1 each by Does not apply route QID. 1 each 0  . rOPINIRole (REQUIP) 3 MG tablet TAKE 1 TABLET BY MOUTH AT BEDTIME 90 tablet 1  . triamcinolone cream (KENALOG) 0.1 % Apply 1 application topically 2 (two) times daily. 45 g 0   No current facility-administered medications for this visit.      PHYSICAL EXAMINATION: ECOG PERFORMANCE STATUS: 1 - Symptomatic but completely ambulatory  BP 131/81 (BP Location: Left Arm, Patient Position: Sitting)   Pulse 86   Temp 98.9 F (37.2 C) (Tympanic)   Wt 238 lb 3.2 oz (108 kg)   BMI 34.18 kg/m   Filed Weights   04/13/19 1012  Weight: 238 lb 3.2 oz (108 kg)   Physical Exam  Constitutional: She is oriented  to person, place, and time and well-developed, well-nourished, and in no distress.  She is alone.  Not on oxygen.  She is walking herself.  HENT:  Head: Normocephalic and atraumatic.  Mouth/Throat: Oropharynx is clear and moist. No oropharyngeal exudate.  Eyes: Pupils are equal, round, and reactive to light.  Neck: Normal range of motion. Neck supple.  Cardiovascular: Normal rate and regular rhythm.  Pulmonary/Chest: No respiratory distress. She has no wheezes.  Decreased air entry bilaterally.  Abdominal: Soft. Bowel sounds are normal. She exhibits no distension and no mass. There is no abdominal tenderness. There is no rebound and no guarding.  Musculoskeletal: Normal range of motion.        General: No tenderness or edema.  Neurological: She is alert and oriented to person, place, and time.  Skin: Skin is warm.  Psychiatric: Affect normal.   LABORATORY DATA:  I have reviewed the data as listed    Component Value Date/Time   NA 134 (L) 04/13/2019 0939   NA 140 05/01/2018 1116   K 4.0 04/13/2019 0939   CL 102 04/13/2019 0939   CO2 24 04/13/2019 0939   GLUCOSE 109 (H) 04/13/2019 0939   BUN 18 04/13/2019 0939   BUN 15 05/01/2018 1116   CREATININE 0.88 04/13/2019 0939   CALCIUM 8.5 (L) 04/13/2019 0939   PROT 6.8 04/13/2019 0939   PROT 6.7 05/01/2018 1116   ALBUMIN 3.8 04/13/2019 0939   ALBUMIN 4.2 05/01/2018 1116   AST 20 04/13/2019 0939   ALT 22 04/13/2019 0939   ALKPHOS 52 04/13/2019 0939   BILITOT 0.4 04/13/2019 0939   BILITOT 0.3 05/01/2018 1116   GFRNONAA >60 04/13/2019 0939   GFRAA >60 04/13/2019 0939     No results found for: SPEP, UPEP  Lab Results  Component Value Date   WBC 5.8 04/13/2019   NEUTROABS 2.8 04/13/2019   HGB 12.4 04/13/2019   HCT 40.3 04/13/2019   MCV 91.0 04/13/2019   PLT 213 04/13/2019      Chemistry      Component Value Date/Time   NA 134 (L) 04/13/2019 0939   NA 140 05/01/2018 1116   K 4.0 04/13/2019 0939   CL 102 04/13/2019 0939   CO2 24 04/13/2019 0939   BUN 18 04/13/2019 0939   BUN 15 05/01/2018 1116   CREATININE 0.88 04/13/2019 0939      Component Value Date/Time   CALCIUM 8.5 (L) 04/13/2019 0939   ALKPHOS 52 04/13/2019 0939   AST 20 04/13/2019 0939   ALT 22 04/13/2019 0939   BILITOT 0.4 04/13/2019 0939   BILITOT 0.3 05/01/2018 1116       RADIOGRAPHIC STUDIES: I have personally reviewed the radiological images as listed and agreed with the findings in the report. Ct Chest W Contrast  Result Date: 04/12/2019 CLINICAL DATA:  Restaging non-small cell lung cancer. EXAM: CT CHEST WITH CONTRAST TECHNIQUE: Multidetector CT imaging of the chest was performed during intravenous contrast administration. CONTRAST:  70mL OMNIPAQUE IOHEXOL 300 MG/ML  SOLN COMPARISON:  11/16/2018 FINDINGS: Cardiovascular: Normal heart size. No pericardial effusion. Aortic atherosclerosis. Lad coronary artery atherosclerotic calcification. Mediastinum/Nodes: Normal appearance of the thyroid gland. The trachea appears patent and is midline. Small hiatal hernia. Esophagus otherwise unremarkable. No mediastinal adenopathy Lungs/Pleura: Small right pleural effusion overlying the posterior aspect of the right midlung is slightly increased in volume from previous exam. There is a new small left pleural effusion. Advanced changes of centrilobular and paraseptal emphysema. New left perihilar masslike architectural distortion measuring  5.2 x 3.6 cm, image 73/2. In the adjacent left upper lobe there is a bandlike area of fibrosis and masslike architectural distortion following a geographic  distribution favoring sequelae of external beam radiation. Within the superior segment of the left lower lobe there are 2 small nodules posterior to the left hilum which are nonspecific measuring up to 0.9 cm, image 134/6. In the perihilar right lung there is progressive fibrosis and masslike architectural distortion following a geographic distribution, compatible with progressive changes of external beam radiation. Stable nodule in the posterior right upper lobe measuring 3 mm, image 46/3. New tiny nonspecific nodule in the lateral left apex measures 3 mm, image 29/3. Also new in the left upper lobe is a 3 mm peripheral nodule, image 39/3. Upper Abdomen: 8 mm low-density structure within dome of left lobe of liver is unchanged. Likely small cysts. No acute abnormality noted within the upper abdomen Musculoskeletal: No acute or suspicious osseous findings. Unchanged superior endplate fracture deformity within the T7 vertebra. IMPRESSION: 1. Interval progression of masslike architectural distortion within bilateral perihilar regions, which is favored to represent sequelae of external beam radiation. Attention on follow-up imaging is advised. 2. Within the retro hilar region of the superior segment of left lower lobe there are several new nodular densities measuring up to 9 mm. These fall within the geographic distribution of external beam radiation and are likely postinflammatory. Attention on follow-up imaging advised. 3. Several new tiny, milli metric lung nodules measuring less than 5 mm are noted in the left upper lobe. Likely postinflammatory attention on follow-up imaging advised. 4. New small bilateral pleural effusions, likely reactive 5. Emphysema and aortic atherosclerosis. 6. Lad coronary artery atherosclerotic calcification noted. Aortic Atherosclerosis (ICD10-I70.0) and Emphysema (ICD10-J43.9). Electronically Signed   By: Kerby Moors M.D.   On: 04/12/2019 14:39     ASSESSMENT & PLAN:  Malignant  neoplasm of hilus of left lung ([facility]) #Left hilar- large cell neuroendocrine; limited stage.  TxN2-Stage III; s/p carbo-Etop q 3 w- with concurrent RT- finished may 2020.  December 7th 2020-left hilar consolidation changes noted likely from radiation; also right hilar consolidation noted-thought to be radiation changes.  Subcentimeter lung nodules which are again thought to be inflammatory/radiation.  #Reviewed the above imaging findings with the patient; again suspect radiation changes.  Clinically less likely from underlying malignancy.  Patient awaiting evaluation with Dr. Donella Stade this week.  We will discuss also tumor conference; and plan repeat imaging as per recommendations.   # Mild anemia- 11.9 from chemo-/RT -improved/resolved.   #Small bilateral effusion pleural-again likely reactive.  Monitor for now.  #COPD-on O2 at night prn. continue inhalers. Stable.   #Flu shot: Awaiting with PCP.  # DISPOSITION: Discuss the tumor conference/order follow-up imaging. # port flush in 2 months # follow up in 4 months- MD; labs- cbc/cmp;port flush-Dr.B  # I reviewed the blood work- with the patient in detail; also reviewed the imaging independently [as summarized above]; and with the patient in detail.       No orders of the defined types were placed in this encounter.  All questions were answered. The patient knows to call the clinic with any problems, questions or concerns.      Cammie Sickle, MD 04/13/2019 10:42 AM

## 2019-04-15 ENCOUNTER — Other Ambulatory Visit: Payer: Self-pay | Admitting: *Deleted

## 2019-04-15 ENCOUNTER — Other Ambulatory Visit: Payer: Medicare Other

## 2019-04-15 ENCOUNTER — Other Ambulatory Visit: Payer: Self-pay

## 2019-04-15 ENCOUNTER — Ambulatory Visit
Admission: RE | Admit: 2019-04-15 | Discharge: 2019-04-15 | Disposition: A | Discharge status: Home / Self Care | Payer: Medicare Other | Source: Ambulatory Visit | Attending: Radiation Oncology | Admitting: Radiation Oncology

## 2019-04-15 VITALS — BP 139/81 | HR 99 | Temp 98.0°F | Resp 18 | Wt 238.8 lb

## 2019-04-15 DIAGNOSIS — Z9221 Personal history of antineoplastic chemotherapy: Secondary | ICD-10-CM | POA: Diagnosis not present

## 2019-04-15 DIAGNOSIS — R918 Other nonspecific abnormal finding of lung field: Secondary | ICD-10-CM | POA: Diagnosis not present

## 2019-04-15 DIAGNOSIS — Z85118 Personal history of other malignant neoplasm of bronchus and lung: Secondary | ICD-10-CM | POA: Insufficient documentation

## 2019-04-15 DIAGNOSIS — C3402 Malignant neoplasm of left main bronchus: Secondary | ICD-10-CM | POA: Insufficient documentation

## 2019-04-15 DIAGNOSIS — Z923 Personal history of irradiation: Secondary | ICD-10-CM | POA: Insufficient documentation

## 2019-04-15 DIAGNOSIS — C3412 Malignant neoplasm of upper lobe, left bronchus or lung: Secondary | ICD-10-CM

## 2019-04-15 NOTE — Progress Notes (Signed)
Tumor Board Documentation  Cathy Watts was presented by Dr Rogue Bussing at our Tumor Board on 04/15/2019, which included representatives from medical oncology, radiation oncology, navigation, pathology, radiology, surgical, surgical oncology, internal medicine, pharmacy, genetics, palliative care, research.  Cathy Watts currently presents as a current patient, for discussion with history of the following treatments: active survellience, neoadjuvant chemotherapy.  Additionally, we reviewed previous medical and familial history, history of present illness, and recent lab results along with all available histopathologic and imaging studies. The tumor board considered available treatment options and made the following recommendations: Active surveillance Repeat CT in 3 months  The following procedures/referrals were also placed: No orders of the defined types were placed in this encounter.   Clinical Trial Status: not discussed   Staging used:    National site-specific guidelines   were discussed with respect to the case.  Tumor board is a meeting of clinicians from various specialty areas who evaluate and discuss patients for whom a multidisciplinary approach is being considered. Final determinations in the plan of care are those of the provider(s). The responsibility for follow up of recommendations given during tumor board is that of the provider.   Today's extended care, comprehensive team conference, [place] was not present for the discussion and was not examined.   Multidisciplinary Tumor Board is a multidisciplinary case peer review process.  Decisions discussed in the Multidisciplinary Tumor Board reflect the opinions of the specialists present at the conference without having examined the patient.  Ultimately, treatment and diagnostic decisions rest with the primary provider(s) and the patient.

## 2019-04-15 NOTE — Progress Notes (Signed)
Radiation Oncology Follow up Note  Name: Cathy Watts   Date:   04/15/2019 MRN:  [id number] DOB: 24-Nov-1945    This 73 y.o. female presents to the clinic today for follow-up of bilateral lung cancers right lower lobe squamous cell carcinoma treated back in 2016 as well as left hilar neuroendocrine carcinoma status post concurrent chemoradiation completed in April 2020.  REFERRING PROVIDER: Florian Buff*  HPI: Patient is a 73 year old female seen in follow-up today who is had bilateral radiation therapy to her chest.  She had a right lower lobe squamous cell carcinoma stage Ia treated back in January 2017.  She also had a stage III left hilar neuroendocrine carcinoma treated with concurrent chemoradiation therapy completed back in April 2020.  She is being followed with CT scans.  Which has shown interval progression of masslike architectural distortion within the bilateral perihilar regions favored to represent sequela of external beam radiation.  There is some tracking of new nodular densities measuring up to 9 mm in the left lung which does follow the geographic distribution of our external beam treatment.  She is doing well specifically [place] cough hemoptysis or chest tightness.  She was presented today at our weekly tumor conference with recommendation for a 3 to 1-month CT scan and follow-up.  COMPLICATIONS OF TREATMENT: none  FOLLOW UP COMPLIANCE: keeps appointments   PHYSICAL EXAM:  BP 139/81   Pulse 99   Temp 98 F (36.7 C)   Resp 18   Wt 238 lb 12.8 oz (108.3 kg)   BMI 34.26 kg/m  Well-developed well-nourished patient in NAD. HEENT reveals PERLA, EOMI, discs not visualized.  Oral cavity is clear. No oral mucosal lesions are identified. Neck is clear without evidence of cervical or supraclavicular adenopathy. Lungs are clear to A&P. Cardiac examination is essentially unremarkable with regular rate and rhythm without murmur rub or thrill. Abdomen is benign with no  organomegaly or masses noted. Motor sensory and DTR levels are equal and symmetric in the upper and lower extremities. Cranial nerves II through XII are grossly intact. Proprioception is intact. No peripheral adenopathy or edema is identified. No motor or sensory levels are noted. Crude visual fields are within normal range.  RADIOLOGY RESULTS: Serial CT scans reviewed and compatible with above-stated findings  PLAN: At this time based on tumor board recommendations will repeat her CT scan in approximately 3 months with a follow-up shortly thereafter.  She was still still see progression of disease we may want to order PET CT scan at that time and possible bronchoscopy to evaluate radiologic progression.  All of this was explained to the patient.  She seems to comprehend my treatment plan and recommendations well.  Follow-up appointment in about 4 months with CT scan of the chest with contrast prior to that was made.  I would like to take this opportunity to thank you for allowing me to participate in the care of your patient.Noreene Filbert, MD

## 2019-04-15 NOTE — Progress Notes (Signed)
CT

## 2019-04-16 ENCOUNTER — Telehealth: Payer: Self-pay | Admitting: Internal Medicine

## 2019-04-16 NOTE — Telephone Encounter (Signed)
Message left with patient to call back to further discuss recommendations.

## 2019-04-16 NOTE — Telephone Encounter (Signed)
Cathy Watts-please inform patient that we discussed her case at the tumor conference-felt more likely radiation changes.  However would recommend a close follow-up; proceed with imaging as planned in 4 months.

## 2019-04-19 NOTE — Telephone Encounter (Signed)
Spoke with pt and she has been made aware of MD recommendations. Informed to keep appts as scheduled. Pt verbalized understanding.

## 2019-04-22 NOTE — Progress Notes (Signed)
Patient: Cathy Watts, Female    DOB: 09/21/1945, 73 y.o.   MRN: [id number] Visit Date: 04/23/2019  Today's Provider: Mar Daring, PA-C   Chief Complaint  Patient presents with  . Annual Exam   Subjective:     Complete Physical Cathy Watts is a 73 y.o. female. She feels well. She reports exercising some. She reports she is sleeping well. ----------------------------------------------------------- 06/24/2019-Mammogram Scheduled.  Review of Systems  Constitutional: Negative.   HENT: Negative.   Eyes: Negative.   Respiratory: Negative.   Cardiovascular: Negative.   Gastrointestinal: Negative.   Endocrine: Negative.   Genitourinary: Negative.   Musculoskeletal: Negative.   Skin: Negative.   Allergic/Immunologic: Negative.   Neurological: Negative.   Hematological: Negative.   Psychiatric/Behavioral: Negative.     Social History   Socioeconomic History  . Marital status: Widowed    Spouse name: Not on file  . Number of children: 3  . Years of education: Not on file  . Highest education level: 12th grade  Occupational History  . Occupation: Agricultural consultant    Comment: retired  Tobacco Use  . Smoking status: Former Smoker    Packs/day: 1.00    Years: 55.00    Pack years: 55.00    Types: Cigarettes    Quit date: 11/02/2017    Years since quitting: 1.4  . Smokeless tobacco: Never Used  Substance and Sexual Activity  . Alcohol use: Yes    Alcohol/week: 0.0 standard drinks    Comment: occasionally- wine  . Drug use: No  . Sexual activity: Not Currently  Other Topics Concern  . Not on file  Social History Narrative   Grandson lives with patient. Is available to help her as needed.   Social Determinants of Health   Financial Resource Strain:   . Difficulty of Paying Living Expenses: Not on file  Food Insecurity:   . Worried About Charity fundraiser in the Last Year: Not on file  . Ran Out of Food in the Last  Year: Not on file  Transportation Needs:   . Lack of Transportation (Medical): Not on file  . Lack of Transportation (Non-Medical): Not on file  Physical Activity: Inactive  . Days of Exercise per Week: 0 days  . Minutes of Exercise per Session: 0 min  Stress:   . Feeling of Stress : Not on file  Social Connections: Unknown  . Frequency of Communication with Friends and Family: Patient refused  . Frequency of Social Gatherings with Friends and Family: Patient refused  . Attends Religious Services: Patient refused  . Active Member of Clubs or Organizations: Patient refused  . Attends Archivist Meetings: Patient refused  . Marital Status: Patient refused  Intimate Partner Violence: Unknown  . Fear of Current or Ex-Partner: Patient refused  . Emotionally Abused: Patient refused  . Physically Abused: Patient refused  . Sexually Abused: Patient refused    Past Medical History:  Diagnosis Date  . Abdominal aortic aneurysm (AAA) 3.0 cm to 5.0 cm in diameter in female [facility]) 05/2018   being followed by dr. Delana Meyer. will reultrasound in 1 year  . Anxiety   . Asthma   . Barrett's esophagus   . COPD (chronic obstructive pulmonary disease) ([place])   . Depression   . GERD (gastroesophageal reflux disease)   . History of peptic ulcer disease   . Hypercholesterolemia   . Low oxygen saturation    2l hs  .  Osteopenia   . Panic disorder   . Personal history of radiation therapy   . Personal history of tobacco use, presenting hazards to health 01/16/2015  . Requires supplemental oxygen 05/2018   supposed to use 2L NP at night but she currently does not.  encouraged patient to resume this  . Sleep apnea    snores significantly but has never been tested for OSA  . SOB (shortness of breath)   . Squamous cell carcinoma of right lung [facility]) 2015     Patient Active Problem List   Diagnosis Date Noted  . Left leg swelling 05/14/2018  . Elevated hemoglobin A1c 05/02/2018  . Abdominal  aortic aneurysm (AAA) 3.0 cm to 5.0 cm in diameter in female ([place]) 06/30/2017  . Abdominal aortic atherosclerosis ([place]) 06/30/2017  . Chronic venous insufficiency 04/11/2016  . Varicose veins of bilateral lower extremities with pain 03/12/2016  . Cancer of lower lobe of right lung ([place]) 10/09/2015  . Oropharyngeal candidiasis 07/12/2015  . Malignant neoplasm of hilus of left lung ([place])   . Personal history of tobacco use, presenting hazards to health 01/16/2015  . Airway hyperreactivity 11/15/2014  . CN (constipation) 11/15/2014  . Daytime somnolence 11/15/2014  . Clinical depression 11/15/2014  . DD (diverticular disease) 11/15/2014  . Accumulation of fluid in tissues 11/15/2014  . H/O peptic ulcer 11/15/2014  . Osteopenia 11/15/2014  . Awareness of heartbeats 11/15/2014  . Episodic paroxysmal anxiety disorder 11/15/2014  . Apnea, sleep 11/15/2014  . Snores 11/15/2014  . Breath shortness 11/15/2014  . Compulsive tobacco user syndrome 11/15/2014  . Avitaminosis D 11/15/2014  . Barrett esophagus 10/19/2014  . Acid reflux 10/19/2014  . COPD, moderate ([place]) 08/31/2013  . Dyspnea 08/31/2013  . Otitis, externa, infective 08/31/2013  . Nocturnal hypoxemia 08/31/2013  . Hypercholesterolemia without hypertriglyceridemia 11/05/2005    Past Surgical History:  Procedure Laterality Date  . CERVICAL CONE BIOPSY  1990  . CHOLECYSTECTOMY  1980's  . colonoscopy  2014  . COLONOSCOPY  07/21/2012  . COLONOSCOPY WITH PROPOFOL N/A 05/12/2018   Procedure: COLONOSCOPY WITH PROPOFOL;  Surgeon: Lollie Sails, MD;  Location: [facility] ENDOSCOPY;  Service: Endoscopy;  Laterality: N/A;  . ELBOW FRACTURE SURGERY Right 2009  . ENDOBRONCHIAL ULTRASOUND N/A 02/07/2015   Procedure: ENDOBRONCHIAL ULTRASOUND;  Surgeon: Flora Lipps, MD;  Location: [facility] ORS;  Service: Cardiopulmonary;  Laterality: N/A;  . ENDOBRONCHIAL ULTRASOUND Left 06/05/2018   Procedure: ENDOBRONCHIAL ULTRASOUND;  Surgeon: Flora Lipps, MD;   Location: [facility] ORS;  Service: Cardiopulmonary;  Laterality: Left;  . ESOPHAGOGASTRODUODENOSCOPY (EGD) WITH PROPOFOL N/A 02/26/2016   Procedure: ESOPHAGOGASTRODUODENOSCOPY (EGD) WITH PROPOFOL;  Surgeon: Lollie Sails, MD;  Location: [facility] ENDOSCOPY;  Service: Endoscopy;  Laterality: N/A;  COPD, Sleep Apnea  . ESOPHAGOGASTRODUODENOSCOPY (EGD) WITH PROPOFOL N/A 05/12/2018   Procedure: ESOPHAGOGASTRODUODENOSCOPY (EGD) WITH PROPOFOL;  Surgeon: Lollie Sails, MD;  Location: [facility] ENDOSCOPY;  Service: Endoscopy;  Laterality: N/A;  . EYE SURGERY Bilateral    cataract extractions  . FRACTURE SURGERY  2005   elbow repair  . PORTA CATH INSERTION N/A 06/17/2018   Procedure: PORTA CATH INSERTION;  Surgeon: Katha Cabal, MD;  Location: [place] CV LAB;  Service: Cardiovascular;  Laterality: N/A;  . TONSILLECTOMY  1979  . TUBAL LIGATION  1970's  . UPPER GI ENDOSCOPY      Her family history includes Arthritis in her mother; Breast cancer in her sister; Cancer in her paternal aunt; Cirrhosis in her sister; Colon cancer in her mother; Diabetes in  her mother and sister; Glaucoma in her mother; Heart attack in her sister; Ovarian cancer in her sister; Parkinson's disease in her sister; Stomach cancer in her father; Throat cancer in her brother.   Current Outpatient Medications:  .  albuterol (PROAIR HFA) 108 (90 Base) MCG/ACT inhaler, Inhale 2 puffs into the lungs every 4 (four) hours as needed for wheezing or shortness of breath., Disp: 1 each, Rfl: 2 .  buPROPion (WELLBUTRIN XL) 150 MG 24 hr tablet, Take 1 tablet by mouth once daily, Disp: 90 tablet, Rfl: 1 .  busPIRone (BUSPAR) 30 MG tablet, Take 1 tablet by mouth twice daily, Disp: 180 tablet, Rfl: 1 .  CALCIUM CARBONATE-VIT D-MIN PO, Take 1 tablet by mouth 2 (two) times daily. , Disp: , Rfl:  .  clotrimazole-betamethasone (LOTRISONE) cream, 1 (one) Cream Cream Apply twice daily to affected area.  Not for Internal use. (Patient taking  differently: Apply 1 application topically daily as needed (rash). 1 (one) Cream Cream Apply twice daily to affected area.  Not for Internal use.), Disp: 45 g, Rfl: 1 .  escitalopram (LEXAPRO) 20 MG tablet, Take 1 tablet (20 mg total) by mouth daily., Disp: 90 tablet, Rfl: 1 .  Fluticasone-Umeclidin-Vilant (TRELEGY ELLIPTA) 100-62.5-25 MCG/INH AEPB, Inhale 1 puff into the lungs daily., Disp: 180 each, Rfl: 3 .  Magnesium 250 MG TABS, Take 250 mg by mouth 2 (two) times daily. , Disp: , Rfl:  .  polyethylene glycol powder (GLYCOLAX/MIRALAX) powder, TAKE 17 GRAMS OF POWDER MIXED IN 8 OUNCES OF WATER BY MOUTH ONCE A DAY. (Patient taking differently: Take 1 Container by mouth daily. TAKE 17 GRAMS OF POWDER MIXED IN 8 OUNCES OF WATER BY MOUTH ONCE A DAY.), Disp: 255 g, Rfl: 3 .  pravastatin (PRAVACHOL) 20 MG tablet, TAKE 1 TABLET BY MOUTH AT BEDTIME, Disp: 90 tablet, Rfl: 0 .  rOPINIRole (REQUIP) 3 MG tablet, TAKE 1 TABLET BY MOUTH AT BEDTIME, Disp: 90 tablet, Rfl: 1 .  triamcinolone cream (KENALOG) 0.1 %, Apply 1 application topically 2 (two) times daily., Disp: 45 g, Rfl: 0 .  furosemide (LASIX) 20 MG tablet, Take 1 tablet (20 mg total) by mouth daily. (Patient not taking: Reported on 04/23/2019), Disp: 90 tablet, Rfl: 1 .  lidocaine-prilocaine (EMLA) cream, Apply 1 application topically as needed. (Patient not taking: Reported on 04/23/2019), Disp: 30 g, Rfl: 0 .  omeprazole (PRILOSEC) 40 MG capsule, Take 1 capsule by mouth once daily, Disp: 90 capsule, Rfl: 0 .  potassium chloride SA (K-DUR) 20 MEQ tablet, Take 1 tablet (20 mEq total) by mouth daily. (Patient not taking: Reported on 04/23/2019), Disp: 30 tablet, Rfl: 3 .  Respiratory Therapy Supplies (FLUTTER) DEVI, 1 each by Does not apply route QID., Disp: 1 each, Rfl: 0  Patient Care Team: Mar Daring, PA-C as PCP - General (Family Medicine) Telford Nab, RN as Registered Nurse Rogue Bussing, Elisha Headland, MD as Consulting Physician (Internal  Medicine) Flora Lipps, MD as Consulting Physician (Pulmonary Disease) Noreene Filbert, MD as Referring Physician (Radiation Oncology)     Objective:    Vitals: BP 115/73 (BP Location: Left Arm, Patient Position: Sitting, Cuff Size: Large)   Pulse 83   Temp (!) 97.5 F (36.4 C) (Temporal)   Resp 18   Ht 5\' 11"  (1.803 m)   Wt 233 lb 6.4 oz (105.9 kg)   BMI 32.55 kg/m   Physical Exam Vitals reviewed.  Constitutional:      General: She is not in acute distress.  Appearance: Normal appearance. She is well-developed. She is obese. She is not ill-appearing or diaphoretic.  HENT:     Head: Normocephalic and atraumatic.     Right Ear: Tympanic membrane, ear canal and external ear normal.     Left Ear: Tympanic membrane, ear canal and external ear normal.  Eyes:     General: No scleral icterus.       Right eye: No discharge.        Left eye: No discharge.     Extraocular Movements: Extraocular movements intact.     Conjunctiva/sclera: Conjunctivae normal.     Pupils: Pupils are equal, round, and reactive to light.  Neck:     Thyroid: No thyromegaly.     Vascular: No carotid bruit or JVD.     Trachea: No tracheal deviation.  Cardiovascular:     Rate and Rhythm: Normal rate and regular rhythm.     Pulses: Normal pulses.     Heart sounds: Normal heart sounds. No murmur. No friction rub. No gallop.   Pulmonary:     Effort: Pulmonary effort is normal. No respiratory distress.     Breath sounds: Rhonchi and rales present. No wheezing.  Chest:     Chest wall: No tenderness.  Abdominal:     General: Abdomen is flat. Bowel sounds are normal. There is no distension.     Palpations: Abdomen is soft. There is no mass.     Tenderness: There is no abdominal tenderness. There is no guarding or rebound.  Musculoskeletal:        General: No tenderness. Normal range of motion.     Cervical back: Normal range of motion and neck supple.     Right lower leg: No edema.     Left lower leg: No  edema.  Lymphadenopathy:     Cervical: No cervical adenopathy.  Skin:    General: Skin is warm and dry.     Capillary Refill: Capillary refill takes less than 2 seconds.     Findings: No rash.  Neurological:     General: No focal deficit present.     Mental Status: She is alert and oriented to person, place, and time. Mental status is at baseline.  Psychiatric:        Mood and Affect: Mood normal.        Behavior: Behavior normal.        Thought Content: Thought content normal.        Judgment: Judgment normal.     Activities of Daily Living In your present state of health, do you have any difficulty performing the following activities: 04/23/2019 03/25/2019  Hearing? N N  Vision? N N  Difficulty concentrating or making decisions? N N  Walking or climbing stairs? N N  Dressing or bathing? N N  Doing errands, shopping? N N  Preparing Food and eating ? - N  Using the Toilet? - N  In the past six months, have you accidently leaked urine? - N  Do you have problems with loss of bowel control? - N  Managing your Medications? - N  Managing your Finances? - N  Some recent data might be hidden    Fall Risk Assessment Fall Risk  03/25/2019 05/26/2017 04/24/2017 05/13/2016 05/13/2016  Falls in the past year? 0 No No No No  Number falls in past yr: 0 - - - -  Injury with Fall? 0 - - - -     Depression Screen PHQ 2/9 Scores 04/23/2019  03/25/2019 05/01/2018 05/26/2017  PHQ - 2 Score - 0 0 0  PHQ- 9 Score - - 6 -  Exception Documentation Patient refusal - - -    6CIT Screen 03/25/2019  What Year? 0 points  What month? 0 points  What time? 0 points  Count back from 20 0 points  Months in reverse 0 points  Repeat phrase 0 points  Total Score 0       Assessment & Plan:    Annual Physical Reviewed patient's Family Medical History Reviewed and updated list of patient's medical providers Assessment of cognitive impairment was done Assessed patient's functional  ability Established a written schedule for health screening [place] Completed and Reviewed  Exercise Activities and Dietary recommendations Goals    . DIET - EAT MORE FRUITS AND VEGETABLES     Recommend eating 2 servings of vegetables and fruits a day. Substitute for junk food.        Immunization History  Administered Date(s) Administered  . Influenza Split 04/11/2011, 04/15/2012, 04/02/2013  . Influenza, High Dose Seasonal PF 04/24/2016, 04/24/2017  . Influenza, Seasonal, Injecte, Preservative Fre 04/25/2015  . Influenza,inj,Quad PF,6+ Mos 04/20/2013  . Influenza-Unspecified 02/08/2014, 02/18/2018  . Pneumococcal Conjugate-13 04/22/2014  . Pneumococcal Polysaccharide-23 04/20/2013  . Pneumococcal-Unspecified 04/02/2013, 04/10/2014  . Tdap 11/24/2009  . Zoster 07/19/2013    Health Maintenance  Topic Date Due  . INFLUENZA VACCINE  12/05/2018  . TETANUS/TDAP  11/25/2019  . MAMMOGRAM  03/13/2020  . DEXA SCAN  06/24/2020  . COLONOSCOPY  05/13/2023  . Hepatitis C Screening  Completed  . PNA vac Low Risk Adult  Completed     Discussed health benefits of physical activity, and encouraged her to engage in regular exercise appropriate for her age and condition.    1. Annual physical exam Normal physical exam today. Will check labs as below and f/u pending lab results. If labs are stable and WNL she will not need to have these rechecked for one year at her next annual physical exam. She is to call the office in the meantime if she has any acute issue, questions or concerns.  2. Cancer of lower lobe of right lung ([place]) Acute worsening of shortness of breath, increased coughing and wheezing. Increased adventitious lung sounds noted on exam. Will give steroid taper as below. Call if not improving or symptoms worsen.  - predniSONE (DELTASONE) 10 MG tablet; Take 6 tablets PO on day 1 and day 2, take 5 tablets PO on day 3 and day 4, take 4 tablets PO on day 5 and  day 6, take 3 tablets PO on day 7 and day 8, take 2 tablets PO on day 9 and day 10, take one tablet PO on day 11 and day 12.  Dispense: 42 tablet; Refill: 0  3. Chronic obstructive pulmonary disease with acute lower respiratory infection ([place]) See above medical treatment plan. - predniSONE (DELTASONE) 10 MG tablet; Take 6 tablets PO on day 1 and day 2, take 5 tablets PO on day 3 and day 4, take 4 tablets PO on day 5 and day 6, take 3 tablets PO on day 7 and day 8, take 2 tablets PO on day 9 and day 10, take one tablet PO on day 11 and day 12.  Dispense: 42 tablet; Refill: 0  4. Abdominal aortic atherosclerosis (H[facility]) Continue pravastatin 20mg . Will check labs as below and f/u pending results. - Lipid Profile; Future  5. Need for influenza vaccination Flu  vaccine given today without complication. Patient sat upright for 15 minutes to check for adverse reaction before being released. - Flu Vaccine QUAD High Dose(Fluad)  6. Need for pneumococcal vaccination Pneumococcal 23 Vaccine given to patient without complications. Patient sat for 15 minutes after administration and was tolerated well without adverse effects. - Pneumococcal polysaccharide vaccine 23-valent greater than or equal to 2yo subcutaneous/IM  7. Hypercholesterolemia without hypertriglyceridemia Continue pravastatin 20mg . Will check labs as below and f/u pending results. - Lipid Profile; Future  8. Avitaminosis D H/O this. Will check labs as below and f/u pending results. - Vitamin D (25 hydroxy); Future  9. Elevated hemoglobin A1c Diet controlled. Will check labs as below and f/u pending results. - HgB A1c; Future  ------------------------------------------------------------------------------------------------------------    Mar Daring, PA-C  [facility]

## 2019-04-23 ENCOUNTER — Other Ambulatory Visit: Payer: Self-pay | Admitting: Physician Assistant

## 2019-04-23 ENCOUNTER — Ambulatory Visit (INDEPENDENT_AMBULATORY_CARE_PROVIDER_SITE_OTHER): Payer: Medicare Other | Admitting: Physician Assistant

## 2019-04-23 ENCOUNTER — Encounter: Payer: Self-pay | Admitting: Physician Assistant

## 2019-04-23 ENCOUNTER — Other Ambulatory Visit: Payer: Self-pay

## 2019-04-23 VITALS — BP 115/73 | HR 83 | Temp 97.5°F | Resp 18 | Ht 71.0 in | Wt 233.4 lb

## 2019-04-23 DIAGNOSIS — Z0000 Encounter for general adult medical examination without abnormal findings: Secondary | ICD-10-CM | POA: Diagnosis not present

## 2019-04-23 DIAGNOSIS — K2100 Gastro-esophageal reflux disease with esophagitis, without bleeding: Secondary | ICD-10-CM

## 2019-04-23 DIAGNOSIS — C3431 Malignant neoplasm of lower lobe, right bronchus or lung: Secondary | ICD-10-CM

## 2019-04-23 DIAGNOSIS — I700 Atherosclerosis of aorta: Secondary | ICD-10-CM

## 2019-04-23 DIAGNOSIS — E7800 Pure hypercholesterolemia, unspecified: Secondary | ICD-10-CM

## 2019-04-23 DIAGNOSIS — J440 Chronic obstructive pulmonary disease with acute lower respiratory infection: Secondary | ICD-10-CM | POA: Diagnosis not present

## 2019-04-23 DIAGNOSIS — E559 Vitamin D deficiency, unspecified: Secondary | ICD-10-CM

## 2019-04-23 DIAGNOSIS — R7309 Other abnormal glucose: Secondary | ICD-10-CM

## 2019-04-23 DIAGNOSIS — Z23 Encounter for immunization: Secondary | ICD-10-CM | POA: Diagnosis not present

## 2019-04-23 MED ORDER — PREDNISONE 10 MG PO TABS: ORAL_TABLET | ORAL | 0 refills | Status: DC

## 2019-04-23 NOTE — Patient Instructions (Signed)
Health Maintenance After Age 73 After age 73, you are at a higher risk for certain long-term diseases and infections as well as injuries from falls. Falls are a major cause of broken bones and head injuries in people who are older than age 73. Getting regular preventive care can help to keep you healthy and well. Preventive care includes getting regular testing and making lifestyle changes as recommended by your health care provider. Talk with your health care provider about:  Which screenings and tests you should have. A screening is a test that checks for a disease when you have no symptoms.  A diet and exercise plan that is right for you. What should I know about screenings and tests to prevent falls? Screening and testing are the best ways to find a health problem early. Early diagnosis and treatment give you the best chance of managing medical conditions that are common after age 73. Certain conditions and lifestyle choices may make you more likely to have a fall. Your health care provider may recommend:  Regular vision checks. Poor vision and conditions such as cataracts can make you more likely to have a fall. If you wear glasses, make sure to get your prescription updated if your vision changes.  Medicine review. Work with your health care provider to regularly review all of the medicines you are taking, including over-the-counter medicines. Ask your health care provider about any side effects that may make you more likely to have a fall. Tell your health care provider if any medicines that you take make you feel dizzy or sleepy.  Osteoporosis screening. Osteoporosis is a condition that causes the bones to get weaker. This can make the bones weak and cause them to break more easily.  Blood pressure screening. Blood pressure changes and medicines to control blood pressure can make you feel dizzy.  Strength and balance checks. Your health care provider may recommend certain tests to check your  strength and balance while standing, walking, or changing positions.  Foot health exam. Foot pain and numbness, as well as not wearing proper footwear, can make you more likely to have a fall.  Depression screening. You may be more likely to have a fall if you have a fear of falling, feel emotionally low, or feel unable to do activities that you used to do.  Alcohol use screening. Using too much alcohol can affect your balance and may make you more likely to have a fall. What actions can I take to lower my risk of falls? General instructions  Talk with your health care provider about your risks for falling. Tell your health care provider if: ? You fall. Be sure to tell your health care provider about all falls, even ones that seem minor. ? You feel dizzy, sleepy, or off-balance.  Take over-the-counter and prescription medicines only as told by your health care provider. These include any supplements.  Eat a healthy diet and maintain a healthy weight. A healthy diet includes low-fat dairy products, low-fat (lean) meats, and fiber from whole grains, beans, and lots of fruits and vegetables. Home safety  Remove any tripping hazards, such as rugs, cords, and clutter.  Install safety equipment such as grab bars in bathrooms and safety rails on stairs.  Keep rooms and walkways well-lit. Activity   Follow a regular exercise program to stay fit. This will help you maintain your balance. Ask your health care provider what types of exercise are appropriate for you.  If you need a cane or   walker, use it as recommended by your health care provider.  Wear supportive shoes that have nonskid soles. Lifestyle  Do not drink alcohol if your health care provider tells you not to drink.  If you drink alcohol, limit how much you have: ? 0-1 drink a day for women. ? 0-2 drinks a day for men.  Be aware of how much alcohol is in your drink. In the [place] one drink equals one typical bottle of beer (12  oz), one-half glass of wine (5 oz), or one shot of hard liquor (1 oz).  Do not use any products that contain nicotine or tobacco, such as cigarettes and e-cigarettes. If you need help quitting, ask your health care provider. Summary  Having a healthy lifestyle and getting preventive care can help to protect your health and wellness after age 73.  Screening and testing are the best way to find a health problem early and help you avoid having a fall. Early diagnosis and treatment give you the best chance for managing medical conditions that are more common for people who are older than age 73.  Falls are a major cause of broken bones and head injuries in people who are older than age 73. Take precautions to prevent a fall at home.  Work with your health care provider to learn what changes you can make to improve your health and wellness and to prevent falls. This information is not intended to replace advice given to you by your health care provider. Make sure you discuss any questions you have with your health care provider. Document Released: 03/05/2017 Document Revised: 08/13/2018 Document Reviewed: 03/05/2017 Elsevier Patient Education  2020 [employer]  

## 2019-05-10 DIAGNOSIS — G4710 Hypersomnia, unspecified: Secondary | ICD-10-CM | POA: Diagnosis not present

## 2019-05-10 DIAGNOSIS — J449 Chronic obstructive pulmonary disease, unspecified: Secondary | ICD-10-CM | POA: Diagnosis not present

## 2019-05-12 ENCOUNTER — Encounter: Payer: Self-pay | Admitting: Oncology

## 2019-05-17 ENCOUNTER — Other Ambulatory Visit: Payer: Self-pay | Admitting: Physician Assistant

## 2019-05-17 DIAGNOSIS — F419 Anxiety disorder, unspecified: Secondary | ICD-10-CM

## 2019-05-26 ENCOUNTER — Other Ambulatory Visit: Payer: Self-pay

## 2019-05-26 ENCOUNTER — Encounter: Payer: Self-pay | Admitting: Internal Medicine

## 2019-05-26 ENCOUNTER — Ambulatory Visit: Payer: Medicare Other | Admitting: Internal Medicine

## 2019-05-26 VITALS — BP 132/84 | HR 95 | Temp 97.5°F | Ht 70.0 in | Wt 236.8 lb

## 2019-05-26 DIAGNOSIS — J441 Chronic obstructive pulmonary disease with (acute) exacerbation: Secondary | ICD-10-CM

## 2019-05-26 MED ORDER — PREDNISONE 20 MG PO TABS: 20.0000 mg | ORAL_TABLET | Freq: Every day | ORAL | 1 refills | Status: DC

## 2019-05-26 NOTE — Patient Instructions (Signed)
COPD exacerbation from vaping and smoking Start prednisone 20 mg daily for 10 days  CONTINUE INHALERS AS PRESCRIBED  PLEASE STOP VAPING AND SMOKING!!

## 2019-05-26 NOTE — Progress Notes (Signed)
Subjective:    Patient ID: ROBERTINE Watts, female    DOB: 1945-06-28, 74 y.o.   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   CC:  Follow-up COPD Follow-up abnormal CT chest   HPI   Chronic shortness of breath and dyspnea exertion On Trelegy triple therapy Patient with increased work of breathing and wheezing Patient vapes and smokes intermittently  I have explained to the patient that she is putting poison in her lungs  SMOKING cessation strongly advised   Smoking Assessment and Cessation Counseling   Upon further questioning, Patient smokes 1/2 ppd  I have advised patient to quit/stop smoking as soon as possible due to high risk for multiple medical problems  Patient is NOT willing to quit smoking  I have advised patient that we can assist and have options of Nicotine replacement therapy. I also advised patient on behavioral therapy and can provide oral medication therapy in conjunction with the other therapies  Follow up next Office visit  for assessment of smoking cessation  Smoking cessation counseling advised for 4 minutes    Oncology history Left hilar- large cell neuroendocrine; limited stage.  TxN2-Stage III; s/p carbo-Etop q 3 w- with concurrent RT- finished may 2020.  December 7th 2020-left hilar consolidation changes noted likely from radiation; also right hilar consolidation noted-thought to be radiation changes.  Subcentimeter lung nodules which are again thought to be inflammatory/radiation.    Review of Systems  Constitutional: Negative for chills, fatigue and fever.  HENT: Positive for congestion.   Respiratory: Positive for cough, chest tightness, shortness of breath and wheezing. Negative for choking.   Cardiovascular: Negative for chest pain and leg  swelling.  Gastrointestinal: Negative.   All other systems reviewed and are negative.  BP 132/84 (BP Location: Left Arm, Patient Position: Sitting, Cuff Size: Large)   Pulse 95   Temp (!) 97.5 F (36.4 C) (Temporal)   Ht 5\' 10"  (1.778 m)   Wt 236 lb 12.8 oz (107.4 kg)   SpO2 97% Comment: on ra  BMI 33.98 kg/m       Objective:   Physical Exam  Constitutional: She is oriented to person, place, and time. She appears well-developed and well-nourished. No distress.  HENT:  Head: Normocephalic and atraumatic.  Eyes: Pupils are equal, round, and reactive to light. EOM are normal.  Cardiovascular: Normal rate, regular rhythm and normal heart sounds.  Pulmonary/Chest: Effort normal. No stridor. No respiratory distress. She has wheezes. She has rales.  Abdominal: Soft.  Musculoskeletal:     Cervical back: Normal range of motion.  Neurological: She is alert and oriented to person, place, and time.  Skin: She is not diaphoretic.         Assessment & Plan:   74 year old pleasant white female with previous history of stage Ia squamous cell carcinoma with recurrent lung cancer stage III limited large neuroendocrine cancer with underlying COPD with chronic hypoxic respiratory failure   COPD Gold stage a Stable at this time continue triple therapy inhaler Albuterol as needed  Chronic hypoxic respiratory failure from COPD   Continue oxygen as prescribed Patient uses and benefits from oxygen therapy  COPD exacerbation from advanced multiple smoker from vaping and smoking Prednisone 20 mg daily for 10 days  COVID-19 EDUCATION: The signs and symptoms of COVID-19 were discussed  with the patient and how to seek care for testing.  The importance of social distancing was discussed today. Hand Washing Techniques and avoid touching face was advised.     MEDICATION ADJUSTMENTS/LABS AND TESTS ORDERED: Continue inhalers as prescribed Prednisone 20 mg daily for 10 days Please stop  vaping and smoking   CURRENT MEDICATIONS REVIEWED AT LENGTH WITH PATIENT TODAY   Patient satisfied with Plan of action and management. All questions answered  Follow up in 6 months   Cathy Watts Cathy Watts, M.D.  [facility]  Medical Director [place] Director [facility] Cardio-Pulmonary Department

## 2019-05-27 ENCOUNTER — Telehealth: Payer: Self-pay | Admitting: Internal Medicine

## 2019-05-27 ENCOUNTER — Other Ambulatory Visit: Payer: Self-pay | Admitting: Internal Medicine

## 2019-05-27 DIAGNOSIS — R059 Cough, unspecified: Secondary | ICD-10-CM

## 2019-05-27 DIAGNOSIS — R05 Cough: Secondary | ICD-10-CM

## 2019-05-27 MED ORDER — GUAIFENESIN-DM 100-10 MG/5ML PO SYRP: 5.0000 mL | ORAL_SOLUTION | ORAL | 0 refills | Status: DC | PRN

## 2019-05-27 NOTE — Telephone Encounter (Signed)
Lm to make pt aware of below message.

## 2019-05-27 NOTE — Telephone Encounter (Signed)
Just sent prescription

## 2019-05-27 NOTE — Progress Notes (Signed)
COUGH MED SENT

## 2019-05-27 NOTE — Telephone Encounter (Signed)
Called and spoke to pt, who stated that it was mentioned during 05/26/19 OV that Dr. Mortimer Fries would call in a cough medicine.  [facility] only receive prednisone Rx.  DK, please advise on cough med? Thanks

## 2019-05-31 NOTE — Telephone Encounter (Signed)
Pt is aware of below message and voiced her understanding. Nothing further is needed. 

## 2019-06-10 DIAGNOSIS — J449 Chronic obstructive pulmonary disease, unspecified: Secondary | ICD-10-CM | POA: Diagnosis not present

## 2019-06-10 DIAGNOSIS — G4710 Hypersomnia, unspecified: Secondary | ICD-10-CM | POA: Diagnosis not present

## 2019-06-13 ENCOUNTER — Other Ambulatory Visit: Payer: Self-pay | Admitting: Physician Assistant

## 2019-06-13 DIAGNOSIS — E7800 Pure hypercholesterolemia, unspecified: Secondary | ICD-10-CM

## 2019-06-13 NOTE — Telephone Encounter (Signed)
Requested medication (s) are due for refill today: yes  Requested medication (s) are on the active medication list: yes  Last refill:  03/08/19  Future visit scheduled: no  Notes to clinic:  overdue for lab work   Requested Prescriptions  Pending Prescriptions Disp Refills   pravastatin (PRAVACHOL) 20 MG tablet [Pharmacy Med Name: Pravastatin Sodium 20 MG Oral Tablet] 90 tablet 0    Sig: TAKE 1 TABLET BY MOUTH AT BEDTIME      Cardiovascular:  Antilipid - Statins Failed - 06/13/2019 10:05 AM      Failed - Total Cholesterol in normal range and within 360 days    Cholesterol, Total  Date Value Ref Range Status  05/01/2018 220 (H) 100 - 199 mg/dL Final          Failed - LDL in normal range and within 360 days    LDL Calculated  Date Value Ref Range Status  05/01/2018 138 (H) 0 - 99 mg/dL Final          Failed - HDL in normal range and within 360 days    HDL  Date Value Ref Range Status  05/01/2018 59 >39 mg/dL Final          Failed - Triglycerides in normal range and within 360 days    Triglycerides  Date Value Ref Range Status  05/01/2018 116 0 - 149 mg/dL Final          Passed - Patient is not pregnant      Passed - Valid encounter within last 12 months    Recent Outpatient Visits           1 month ago Annual physical exam   [place], [place]   7 months ago Localized swelling of lower extremity   [facility] [place], Clearnce Sorrel, [place]   1 year ago Annual physical exam   [facility] [place], Clearnce Sorrel, [place]   1 year ago Dehydration   [place], [place], [place]   2 years ago Annual physical exam   [facility] [place], [place], [place]

## 2019-06-14 ENCOUNTER — Ambulatory Visit: Payer: Medicare Other

## 2019-06-14 ENCOUNTER — Other Ambulatory Visit: Payer: Self-pay

## 2019-06-14 ENCOUNTER — Telehealth: Payer: Self-pay | Admitting: Internal Medicine

## 2019-06-14 DIAGNOSIS — J40 Bronchitis, not specified as acute or chronic: Secondary | ICD-10-CM | POA: Diagnosis not present

## 2019-06-14 DIAGNOSIS — Z20822 Contact with and (suspected) exposure to covid-19: Secondary | ICD-10-CM | POA: Diagnosis not present

## 2019-06-14 NOTE — Telephone Encounter (Signed)
Per Dr. Mortimer Fries verbally- recommend respiratory clinic.  Spoke to pt and relayed recommendations. Pt stated that she was seen at urgent care today and tested for covid, which was negative. Pt was prescribed abx and prednisone.  appt for respiratory clinic has been canceled per pt request.  Nothing further is needed.

## 2019-06-14 NOTE — Telephone Encounter (Signed)
Pts daughter called and stated her mother has gotten worse since last OV. Increased SOB and cough. She has been takingTussin DM with no relief. She also stated that her mother has been complaining of leg weakness/heaviness, some diarrhea, and fatigue. Tested for Covid a month ago and it was negative. I recommended she get another test done. Pt is refusing to go to the ER and they do not have a pulse ox. Please advise.

## 2019-06-14 NOTE — Telephone Encounter (Signed)
Would you recommend the respiratory clinic for her?

## 2019-06-15 ENCOUNTER — Other Ambulatory Visit: Payer: Self-pay | Admitting: Internal Medicine

## 2019-06-15 ENCOUNTER — Inpatient Hospital Stay: Payer: Medicare Other | Attending: Internal Medicine

## 2019-06-15 ENCOUNTER — Other Ambulatory Visit: Payer: Self-pay

## 2019-06-15 DIAGNOSIS — E7800 Pure hypercholesterolemia, unspecified: Secondary | ICD-10-CM | POA: Diagnosis not present

## 2019-06-15 DIAGNOSIS — K219 Gastro-esophageal reflux disease without esophagitis: Secondary | ICD-10-CM | POA: Diagnosis not present

## 2019-06-15 DIAGNOSIS — Z8711 Personal history of peptic ulcer disease: Secondary | ICD-10-CM | POA: Diagnosis not present

## 2019-06-15 DIAGNOSIS — G4730 Sleep apnea, unspecified: Secondary | ICD-10-CM | POA: Diagnosis not present

## 2019-06-15 DIAGNOSIS — M8580 Other specified disorders of bone density and structure, unspecified site: Secondary | ICD-10-CM | POA: Diagnosis not present

## 2019-06-15 DIAGNOSIS — E785 Hyperlipidemia, unspecified: Secondary | ICD-10-CM | POA: Diagnosis not present

## 2019-06-15 DIAGNOSIS — I4892 Unspecified atrial flutter: Secondary | ICD-10-CM | POA: Diagnosis not present

## 2019-06-15 DIAGNOSIS — Z85118 Personal history of other malignant neoplasm of bronchus and lung: Secondary | ICD-10-CM | POA: Diagnosis not present

## 2019-06-15 DIAGNOSIS — Z881 Allergy status to other antibiotic agents status: Secondary | ICD-10-CM | POA: Diagnosis not present

## 2019-06-15 DIAGNOSIS — R0602 Shortness of breath: Secondary | ICD-10-CM | POA: Diagnosis not present

## 2019-06-15 DIAGNOSIS — I714 Abdominal aortic aneurysm, without rupture: Secondary | ICD-10-CM | POA: Diagnosis not present

## 2019-06-15 DIAGNOSIS — R05 Cough: Secondary | ICD-10-CM | POA: Diagnosis not present

## 2019-06-15 DIAGNOSIS — E871 Hypo-osmolality and hyponatremia: Secondary | ICD-10-CM | POA: Diagnosis not present

## 2019-06-15 DIAGNOSIS — I4430 Unspecified atrioventricular block: Secondary | ICD-10-CM | POA: Diagnosis not present

## 2019-06-15 DIAGNOSIS — Z20822 Contact with and (suspected) exposure to covid-19: Secondary | ICD-10-CM | POA: Diagnosis not present

## 2019-06-15 DIAGNOSIS — Z95828 Presence of other vascular implants and grafts: Secondary | ICD-10-CM

## 2019-06-15 DIAGNOSIS — E878 Other disorders of electrolyte and fluid balance, not elsewhere classified: Secondary | ICD-10-CM | POA: Diagnosis not present

## 2019-06-15 DIAGNOSIS — Z9981 Dependence on supplemental oxygen: Secondary | ICD-10-CM | POA: Diagnosis not present

## 2019-06-15 DIAGNOSIS — Z79899 Other long term (current) drug therapy: Secondary | ICD-10-CM | POA: Diagnosis not present

## 2019-06-15 DIAGNOSIS — R030 Elevated blood-pressure reading, without diagnosis of hypertension: Secondary | ICD-10-CM | POA: Diagnosis not present

## 2019-06-15 DIAGNOSIS — G2581 Restless legs syndrome: Secondary | ICD-10-CM | POA: Diagnosis not present

## 2019-06-15 DIAGNOSIS — Z923 Personal history of irradiation: Secondary | ICD-10-CM | POA: Diagnosis not present

## 2019-06-15 DIAGNOSIS — J441 Chronic obstructive pulmonary disease with (acute) exacerbation: Secondary | ICD-10-CM | POA: Diagnosis not present

## 2019-06-15 DIAGNOSIS — K2270 Barrett's esophagus without dysplasia: Secondary | ICD-10-CM | POA: Diagnosis not present

## 2019-06-15 DIAGNOSIS — Z9049 Acquired absence of other specified parts of digestive tract: Secondary | ICD-10-CM | POA: Diagnosis not present

## 2019-06-15 MED ORDER — SODIUM CHLORIDE 0.9% FLUSH
10.0000 mL | Freq: Once | INTRAVENOUS | Status: AC
  Administered 2019-06-15: 10 mL via INTRAVENOUS
  Filled 2019-06-15: qty 10

## 2019-06-15 MED ORDER — HEPARIN SOD (PORK) LOCK FLUSH 100 UNIT/ML IV SOLN
500.0000 [IU] | Freq: Once | INTRAVENOUS | Status: AC
  Administered 2019-06-15: 14:00:00 500 [IU] via INTRAVENOUS
  Filled 2019-06-15: qty 5

## 2019-06-17 ENCOUNTER — Emergency Department: Payer: Medicare Other

## 2019-06-17 ENCOUNTER — Other Ambulatory Visit: Payer: Self-pay

## 2019-06-17 ENCOUNTER — Telehealth: Payer: Self-pay | Admitting: Internal Medicine

## 2019-06-17 ENCOUNTER — Inpatient Hospital Stay
Admission: EM | Admit: 2019-06-17 | Discharge: 2019-06-19 | DRG: 309 | Disposition: A | Discharge status: Home / Self Care | Payer: Medicare Other | Attending: Internal Medicine | Admitting: Internal Medicine

## 2019-06-17 DIAGNOSIS — E878 Other disorders of electrolyte and fluid balance, not elsewhere classified: Secondary | ICD-10-CM | POA: Diagnosis present

## 2019-06-17 DIAGNOSIS — E7800 Pure hypercholesterolemia, unspecified: Secondary | ICD-10-CM | POA: Diagnosis present

## 2019-06-17 DIAGNOSIS — R030 Elevated blood-pressure reading, without diagnosis of hypertension: Secondary | ICD-10-CM | POA: Diagnosis present

## 2019-06-17 DIAGNOSIS — J441 Chronic obstructive pulmonary disease with (acute) exacerbation: Secondary | ICD-10-CM

## 2019-06-17 DIAGNOSIS — I4892 Unspecified atrial flutter: Secondary | ICD-10-CM | POA: Diagnosis not present

## 2019-06-17 DIAGNOSIS — Z87891 Personal history of nicotine dependence: Secondary | ICD-10-CM

## 2019-06-17 DIAGNOSIS — I4430 Unspecified atrioventricular block: Secondary | ICD-10-CM | POA: Diagnosis present

## 2019-06-17 DIAGNOSIS — F329 Major depressive disorder, single episode, unspecified: Secondary | ICD-10-CM | POA: Diagnosis present

## 2019-06-17 DIAGNOSIS — I714 Abdominal aortic aneurysm, without rupture: Secondary | ICD-10-CM | POA: Diagnosis present

## 2019-06-17 DIAGNOSIS — Z79899 Other long term (current) drug therapy: Secondary | ICD-10-CM

## 2019-06-17 DIAGNOSIS — R0602 Shortness of breath: Secondary | ICD-10-CM | POA: Diagnosis not present

## 2019-06-17 DIAGNOSIS — E785 Hyperlipidemia, unspecified: Secondary | ICD-10-CM

## 2019-06-17 DIAGNOSIS — Z8711 Personal history of peptic ulcer disease: Secondary | ICD-10-CM | POA: Diagnosis not present

## 2019-06-17 DIAGNOSIS — M8580 Other specified disorders of bone density and structure, unspecified site: Secondary | ICD-10-CM | POA: Diagnosis present

## 2019-06-17 DIAGNOSIS — F419 Anxiety disorder, unspecified: Secondary | ICD-10-CM | POA: Diagnosis present

## 2019-06-17 DIAGNOSIS — Z9049 Acquired absence of other specified parts of digestive tract: Secondary | ICD-10-CM | POA: Diagnosis not present

## 2019-06-17 DIAGNOSIS — E871 Hypo-osmolality and hyponatremia: Secondary | ICD-10-CM | POA: Diagnosis present

## 2019-06-17 DIAGNOSIS — Z85118 Personal history of other malignant neoplasm of bronchus and lung: Secondary | ICD-10-CM | POA: Diagnosis not present

## 2019-06-17 DIAGNOSIS — Z9981 Dependence on supplemental oxygen: Secondary | ICD-10-CM

## 2019-06-17 DIAGNOSIS — K219 Gastro-esophageal reflux disease without esophagitis: Secondary | ICD-10-CM | POA: Diagnosis not present

## 2019-06-17 DIAGNOSIS — Z20822 Contact with and (suspected) exposure to covid-19: Secondary | ICD-10-CM | POA: Diagnosis not present

## 2019-06-17 DIAGNOSIS — G2581 Restless legs syndrome: Secondary | ICD-10-CM | POA: Diagnosis present

## 2019-06-17 DIAGNOSIS — Z881 Allergy status to other antibiotic agents status: Secondary | ICD-10-CM | POA: Diagnosis not present

## 2019-06-17 DIAGNOSIS — R05 Cough: Secondary | ICD-10-CM | POA: Diagnosis not present

## 2019-06-17 DIAGNOSIS — K2270 Barrett's esophagus without dysplasia: Secondary | ICD-10-CM | POA: Diagnosis present

## 2019-06-17 DIAGNOSIS — G4730 Sleep apnea, unspecified: Secondary | ICD-10-CM | POA: Diagnosis present

## 2019-06-17 DIAGNOSIS — Z923 Personal history of irradiation: Secondary | ICD-10-CM

## 2019-06-17 DIAGNOSIS — G4733 Obstructive sleep apnea (adult) (pediatric): Secondary | ICD-10-CM | POA: Diagnosis not present

## 2019-06-17 LAB — CBC
HCT: 34.9 % — ABNORMAL LOW (ref 36.0–46.0)
Hemoglobin: 11.2 g/dL — ABNORMAL LOW (ref 12.0–15.0)
MCH: 29.1 pg (ref 26.0–34.0)
MCHC: 32.1 g/dL (ref 30.0–36.0)
MCV: 90.6 fL (ref 80.0–100.0)
Platelets: 368 10*3/uL (ref 150–400)
RBC: 3.85 MIL/uL — ABNORMAL LOW (ref 3.87–5.11)
RDW: 15.8 % — ABNORMAL HIGH (ref 11.5–15.5)
WBC: 11.9 10*3/uL — ABNORMAL HIGH (ref 4.0–10.5)
nRBC: 0 % (ref 0.0–0.2)

## 2019-06-17 LAB — BASIC METABOLIC PANEL
Anion gap: 10 (ref 5–15)
BUN: 14 mg/dL (ref 8–23)
CO2: 27 mmol/L (ref 22–32)
Calcium: 9 mg/dL (ref 8.9–10.3)
Chloride: 97 mmol/L — ABNORMAL LOW (ref 98–111)
Creatinine, Ser: 0.71 mg/dL (ref 0.44–1.00)
GFR calc Af Amer: >60 mL/min (ref >60)
GFR calc non Af Amer: >60 mL/min (ref >60)
Glucose, Bld: 148 mg/dL — ABNORMAL HIGH (ref 70–99)
Potassium: 4.8 mmol/L (ref 3.5–5.1)
Sodium: 134 mmol/L — ABNORMAL LOW (ref 135–145)

## 2019-06-17 LAB — RESPIRATORY PANEL BY RT PCR (FLU A&B, COVID)
Influenza A by PCR: NEGATIVE
Influenza B by PCR: NEGATIVE
SARS Coronavirus 2 by RT PCR: NEGATIVE

## 2019-06-17 LAB — TROPONIN I (HIGH SENSITIVITY)
Troponin I (High Sensitivity): 6 ng/L (ref <18)
Troponin I (High Sensitivity): 5 ng/L (ref <18)

## 2019-06-17 MED ORDER — DOXYCYCLINE HYCLATE 100 MG PO TABS
100.0000 mg | ORAL_TABLET | Freq: Two times a day (BID) | ORAL | Status: DC
  Administered 2019-06-18 – 2019-06-19 (×3): 100 mg via ORAL
  Filled 2019-06-17 (×3): qty 1

## 2019-06-17 MED ORDER — SODIUM CHLORIDE 0.9 % IV BOLUS
1000.0000 mL | Freq: Once | INTRAVENOUS | Status: AC
  Administered 2019-06-17: 1000 mL via INTRAVENOUS

## 2019-06-17 MED ORDER — ROPINIROLE HCL 1 MG PO TABS
3.0000 mg | ORAL_TABLET | Freq: Every day | ORAL | Status: DC
  Administered 2019-06-18 (×2): 3 mg via ORAL
  Filled 2019-06-17 (×2): qty 3

## 2019-06-17 MED ORDER — ACETAMINOPHEN 325 MG PO TABS: 650.0000 mg | ORAL_TABLET | ORAL | Status: DC | PRN

## 2019-06-17 MED ORDER — POTASSIUM CHLORIDE CRYS ER 20 MEQ PO TBCR
20.0000 meq | EXTENDED_RELEASE_TABLET | Freq: Every day | ORAL | Status: DC
  Administered 2019-06-18: 20 meq via ORAL
  Filled 2019-06-17 (×2): qty 1

## 2019-06-17 MED ORDER — ONDANSETRON HCL 4 MG/2ML IJ SOLN: 4.0000 mg | Freq: Four times a day (QID) | INTRAMUSCULAR | Status: DC | PRN

## 2019-06-17 MED ORDER — DILTIAZEM HCL-DEXTROSE 125-5 MG/125ML-% IV SOLN (PREMIX)
5.0000 mg/h | INTRAVENOUS | Status: DC
  Administered 2019-06-17: 5 mg/h via INTRAVENOUS
  Filled 2019-06-17: qty 125

## 2019-06-17 MED ORDER — ZOLPIDEM TARTRATE 5 MG PO TABS: 5.0000 mg | ORAL_TABLET | Freq: Every evening | ORAL | Status: DC | PRN

## 2019-06-17 MED ORDER — GUAIFENESIN-DM 100-10 MG/5ML PO SYRP
5.0000 mL | ORAL_SOLUTION | ORAL | Status: DC | PRN
  Administered 2019-06-18 (×2): 5 mL via ORAL
  Filled 2019-06-17 (×2): qty 5

## 2019-06-17 MED ORDER — IPRATROPIUM-ALBUTEROL 0.5-2.5 (3) MG/3ML IN SOLN
3.0000 mL | Freq: Four times a day (QID) | RESPIRATORY_TRACT | Status: DC
  Administered 2019-06-18: 08:00:00 3 mL via RESPIRATORY_TRACT
  Filled 2019-06-17: qty 3

## 2019-06-17 MED ORDER — DILTIAZEM HCL 25 MG/5ML IV SOLN
15.0000 mg | Freq: Once | INTRAVENOUS | Status: AC
  Administered 2019-06-17: 15 mg via INTRAVENOUS
  Filled 2019-06-17: qty 5

## 2019-06-17 MED ORDER — DILTIAZEM HCL 25 MG/5ML IV SOLN
10.0000 mg | Freq: Once | INTRAVENOUS | Status: AC
  Administered 2019-06-17: 20:00:00 10 mg via INTRAVENOUS
  Filled 2019-06-17: qty 5

## 2019-06-17 MED ORDER — GUAIFENESIN ER 600 MG PO TB12
600.0000 mg | ORAL_TABLET | Freq: Two times a day (BID) | ORAL | Status: DC
  Administered 2019-06-18 – 2019-06-19 (×4): 600 mg via ORAL
  Filled 2019-06-17 (×4): qty 1

## 2019-06-17 MED ORDER — PANTOPRAZOLE SODIUM 40 MG PO TBEC
40.0000 mg | DELAYED_RELEASE_TABLET | Freq: Every day | ORAL | Status: DC
  Administered 2019-06-18 – 2019-06-19 (×2): 40 mg via ORAL
  Filled 2019-06-17 (×2): qty 1

## 2019-06-17 MED ORDER — MAGNESIUM OXIDE 400 (241.3 MG) MG PO TABS
200.0000 mg | ORAL_TABLET | Freq: Two times a day (BID) | ORAL | Status: DC
  Administered 2019-06-18 – 2019-06-19 (×4): 200 mg via ORAL
  Filled 2019-06-17 (×4): qty 1

## 2019-06-17 MED ORDER — ESCITALOPRAM OXALATE 10 MG PO TABS
20.0000 mg | ORAL_TABLET | Freq: Every day | ORAL | Status: DC
  Administered 2019-06-18 – 2019-06-19 (×2): 20 mg via ORAL
  Filled 2019-06-17 (×2): qty 2

## 2019-06-17 MED ORDER — BUSPIRONE HCL 5 MG PO TABS
30.0000 mg | ORAL_TABLET | Freq: Two times a day (BID) | ORAL | Status: DC
  Administered 2019-06-18 – 2019-06-19 (×4): 30 mg via ORAL
  Filled 2019-06-17 (×5): qty 6

## 2019-06-17 MED ORDER — APIXABAN 5 MG PO TABS
5.0000 mg | ORAL_TABLET | Freq: Two times a day (BID) | ORAL | Status: DC
  Administered 2019-06-18 – 2019-06-19 (×4): 5 mg via ORAL
  Filled 2019-06-17 (×4): qty 1

## 2019-06-17 MED ORDER — SODIUM CHLORIDE 0.9 % IV SOLN: INTRAVENOUS | Status: DC

## 2019-06-17 MED ORDER — DILTIAZEM HCL-DEXTROSE 125-5 MG/125ML-% IV SOLN (PREMIX)
5.0000 mg/h | INTRAVENOUS | Status: DC
  Administered 2019-06-18: 05:00:00 12.5 mg/h via INTRAVENOUS
  Administered 2019-06-18: 15 mg/h via INTRAVENOUS
  Administered 2019-06-18: 12.5 mg/h via INTRAVENOUS
  Filled 2019-06-17 (×2): qty 125

## 2019-06-17 MED ORDER — SODIUM CHLORIDE 0.9 % IV SOLN
1.0000 g | INTRAVENOUS | Status: DC
  Administered 2019-06-18: 1 g via INTRAVENOUS
  Filled 2019-06-17 (×2): qty 10

## 2019-06-17 MED ORDER — FUROSEMIDE 40 MG PO TABS
20.0000 mg | ORAL_TABLET | Freq: Every day | ORAL | Status: DC
  Administered 2019-06-18 – 2019-06-19 (×2): 20 mg via ORAL
  Filled 2019-06-17 (×2): qty 1

## 2019-06-17 MED ORDER — PRAVASTATIN SODIUM 20 MG PO TABS
20.0000 mg | ORAL_TABLET | Freq: Every day | ORAL | Status: DC
  Administered 2019-06-18 (×2): 20 mg via ORAL
  Filled 2019-06-17 (×2): qty 1

## 2019-06-17 MED ORDER — ALPRAZOLAM 0.5 MG PO TABS: 0.2500 mg | ORAL_TABLET | Freq: Two times a day (BID) | ORAL | Status: DC | PRN

## 2019-06-17 MED ORDER — METHYLPREDNISOLONE SODIUM SUCC 125 MG IJ SOLR
60.0000 mg | Freq: Three times a day (TID) | INTRAMUSCULAR | Status: DC
  Administered 2019-06-18 – 2019-06-19 (×5): 60 mg via INTRAVENOUS
  Filled 2019-06-17 (×5): qty 2

## 2019-06-17 MED ORDER — SODIUM CHLORIDE 0.9% FLUSH: 3.0000 mL | Freq: Once | INTRAVENOUS | Status: DC

## 2019-06-17 NOTE — ED Provider Notes (Signed)
[facility] Emergency Department Provider Note   ____________________________________________   I have reviewed the triage vital signs and the nursing notes.   HISTORY  Chief Complaint Shortness of Breath   History limited by: Not Limited   HPI Cathy Watts is a 74 y.o. female who presents to the emergency department today because of concern for roughly 2 weeks of feeling poorly. The patient states she has had shortness of breath, cough and weakness. The patient has been on steroids and antibiotics for this prescribed by her PCP without any improvement. The patient additionally states that she traveled to [place] in early January and has not felt quite right since then. She does have history of COPD and lung cancer.   Records reviewed. Per medical record review patient has a history of COPD, lung cancer.  Past Medical History:  Diagnosis Date  . Abdominal aortic aneurysm (AAA) 3.0 cm to 5.0 cm in diameter in female [facility]) 05/2018   being followed by dr. Delana Meyer. will reultrasound in 1 year  . Anxiety   . Asthma   . Barrett's esophagus   . COPD (chronic obstructive pulmonary disease) ([place])   . Depression   . GERD (gastroesophageal reflux disease)   . History of peptic ulcer disease   . Hypercholesterolemia   . Low oxygen saturation    2l hs  . Osteopenia   . Panic disorder   . Personal history of radiation therapy   . Personal history of tobacco use, presenting hazards to health 01/16/2015  . Requires supplemental oxygen 05/2018   supposed to use 2L NP at night but she currently does not.  encouraged patient to resume this  . Sleep apnea    snores significantly but has never been tested for OSA  . SOB (shortness of breath)   . Squamous cell carcinoma of right lung [facility]) 2015    Patient Active Problem List   Diagnosis Date Noted  . Left leg swelling 05/14/2018  . Elevated hemoglobin A1c 05/02/2018  . Abdominal aortic aneurysm (AAA) 3.0 cm  to 5.0 cm in diameter in female ([place]) 06/30/2017  . Abdominal aortic atherosclerosis ([place]) 06/30/2017  . Chronic venous insufficiency 04/11/2016  . Varicose veins of bilateral lower extremities with pain 03/12/2016  . Cancer of lower lobe of right lung ([place]) 10/09/2015  . Oropharyngeal candidiasis 07/12/2015  . Malignant neoplasm of hilus of left lung ([place])   . Personal history of tobacco use, presenting hazards to health 01/16/2015  . Airway hyperreactivity 11/15/2014  . CN (constipation) 11/15/2014  . Daytime somnolence 11/15/2014  . Clinical depression 11/15/2014  . DD (diverticular disease) 11/15/2014  . Accumulation of fluid in tissues 11/15/2014  . H/O peptic ulcer 11/15/2014  . Osteopenia 11/15/2014  . Awareness of heartbeats 11/15/2014  . Episodic paroxysmal anxiety disorder 11/15/2014  . Apnea, sleep 11/15/2014  . Snores 11/15/2014  . Breath shortness 11/15/2014  . Compulsive tobacco user syndrome 11/15/2014  . Avitaminosis D 11/15/2014  . Barrett esophagus 10/19/2014  . Acid reflux 10/19/2014  . COPD, moderate ([place]) 08/31/2013  . Dyspnea 08/31/2013  . Otitis, externa, infective 08/31/2013  . Nocturnal hypoxemia 08/31/2013  . Hypercholesterolemia without hypertriglyceridemia 11/05/2005    Past Surgical History:  Procedure Laterality Date  . CERVICAL CONE BIOPSY  1990  . CHOLECYSTECTOMY  1980's  . colonoscopy  2014  . COLONOSCOPY  07/21/2012  . COLONOSCOPY WITH PROPOFOL N/A 05/12/2018   Procedure: COLONOSCOPY WITH PROPOFOL;  Surgeon: Lollie Sails, MD;  Location: [facility]  ENDOSCOPY;  Service: Endoscopy;  Laterality: N/A;  . ELBOW FRACTURE SURGERY Right 2009  . ENDOBRONCHIAL ULTRASOUND N/A 02/07/2015   Procedure: ENDOBRONCHIAL ULTRASOUND;  Surgeon: Flora Lipps, MD;  Location: [facility] ORS;  Service: Cardiopulmonary;  Laterality: N/A;  . ENDOBRONCHIAL ULTRASOUND Left 06/05/2018   Procedure: ENDOBRONCHIAL ULTRASOUND;  Surgeon: Flora Lipps, MD;  Location: [facility] ORS;  Service:  Cardiopulmonary;  Laterality: Left;  . ESOPHAGOGASTRODUODENOSCOPY (EGD) WITH PROPOFOL N/A 02/26/2016   Procedure: ESOPHAGOGASTRODUODENOSCOPY (EGD) WITH PROPOFOL;  Surgeon: Lollie Sails, MD;  Location[facility] ENDOSCOPY;  Service: Endoscopy;  Laterality: N/A;  COPD, Sleep Apnea  . ESOPHAGOGASTRODUODENOSCOPY (EGD) WITH PROPOFOL N/A 05/12/2018   Procedure: ESOPHAGOGASTRODUODENOSCOPY (EGD) WITH PROPOFOL;  Surgeon: Lollie Sails, MD;  Location: [facility] ENDOSCOPY;  Service: Endoscopy;  Laterality: N/A;  . EYE SURGERY Bilateral    cataract extractions  . FRACTURE SURGERY  2005   elbow repair  . PORTA CATH INSERTION N/A 06/17/2018   Procedure: PORTA CATH INSERTION;  Surgeon: Katha Cabal, MD;  Location: [place] CV LAB;  Service: Cardiovascular;  Laterality: N/A;  . TONSILLECTOMY  1979  . TUBAL LIGATION  1970's  . UPPER GI ENDOSCOPY      Prior to Admission medications   Medication Sig Start Date End Date Taking? Authorizing Provider  busPIRone (BUSPAR) 30 MG tablet Take 1 tablet by mouth twice daily 05/17/19   Mar Daring, PA-C  CALCIUM CARBONATE-VIT D-MIN PO Take 1 tablet by mouth 2 (two) times daily.  01/02/11   [provider]  clotrimazole-betamethasone (LOTRISONE) cream 1 (one) Cream Cream Apply twice daily to affected area.  Not for Internal use. Patient taking differently: Apply 1 application topically daily as needed (rash). 1 (one) Cream Cream Apply twice daily to affected area.  Not for Internal use. 05/01/18   Mar Daring, PA-C  escitalopram (LEXAPRO) 20 MG tablet Take 1 tablet by mouth once daily 05/17/19   Mar Daring, PA-C  Fluticasone-Umeclidin-Vilant (TRELEGY ELLIPTA) 100-62.5-25 MCG/INH AEPB Inhale 1 puff into the lungs daily. 12/28/18   Flora Lipps, MD  furosemide (LASIX) 20 MG tablet Take 1 tablet (20 mg total) by mouth daily. 01/20/19   Mar Daring, PA-C  guaiFENesin-dextromethorphan (ROBITUSSIN DM) 100-10 MG/5ML syrup Take 5  mLs by mouth every 4 (four) hours as needed for cough. 05/27/19   Flora Lipps, MD  lidocaine-prilocaine (EMLA) cream Apply 1 application topically as needed. 06/10/18   Cammie Sickle, MD  Magnesium 250 MG TABS Take 250 mg by mouth 2 (two) times daily.     [provider]  omeprazole (PRILOSEC) 40 MG capsule Take 1 capsule by mouth once daily 04/23/19   Fenton Malling M, PA-C  polyethylene glycol powder (GLYCOLAX/MIRALAX) powder TAKE 17 GRAMS OF POWDER MIXED IN 8 OUNCES OF WATER BY MOUTH ONCE A DAY. Patient taking differently: Take 1 Container by mouth daily. TAKE 17 GRAMS OF POWDER MIXED IN 8 OUNCES OF WATER BY MOUTH ONCE A DAY. 05/02/17   Mar Daring, PA-C  potassium chloride SA (K-DUR) 20 MEQ tablet Take 1 tablet (20 mEq total) by mouth daily. 10/22/18   Mar Daring, PA-C  pravastatin (PRAVACHOL) 20 MG tablet TAKE 1 TABLET BY MOUTH AT BEDTIME 06/14/19   Mar Daring, PA-C  predniSONE (DELTASONE) 10 MG tablet Take 6 tablets PO on day 1 and day 2, take 5 tablets PO on day 3 and day 4, take 4 tablets PO on day 5 and day 6, take 3 tablets PO on day  7 and day 8, take 2 tablets PO on day 9 and day 10, take one tablet PO on day 11 and day 12. 04/23/19   [place]ette, Clearnce Sorrel, PA-C  predniSONE (DELTASONE) 20 MG tablet Take 1 tablet (20 mg total) by mouth daily with breakfast. 10 days 05/26/19   Flora Lipps, MD  PROAIR HFA 108 [phone] Base) MCG/ACT inhaler INHALE 2 PUFFS BY MOUTH EVERY 4 HOURS AS NEEDED FOR WHEEZING FOR SHORTNESS OF BREATH 06/15/19   Flora Lipps, MD  Respiratory Therapy Supplies (FLUTTER) DEVI 1 each by Does not apply route QID. 03/31/18   Flora Lipps, MD  rOPINIRole (REQUIP) 3 MG tablet TAKE 1 TABLET BY MOUTH AT BEDTIME 02/12/19   Mar Daring, PA-C  triamcinolone cream (KENALOG) 0.1 % Apply 1 application topically 2 (two) times daily. 10/22/18   Mar Daring, PA-C    Allergies Amoxicillin  Family History  Problem Relation Age of Onset   . Colon cancer Mother        colon  . Diabetes Mother   . Arthritis Mother   . Glaucoma Mother   . Stomach cancer Father        stomach  . Throat cancer Brother        throat  . Breast cancer Sister        breast  . Diabetes Sister   . Cirrhosis Sister   . Cancer Paternal Aunt   . Parkinson's disease Sister   . Heart attack Sister   . Ovarian cancer Sister     Social History Social History   Tobacco Use  . Smoking status: Former Smoker    Packs/day: 1.00    Years: 55.00    Pack years: 55.00    Types: Cigarettes    Quit date: 11/02/2017    Years since quitting: 1.6  . Smokeless tobacco: Never Used  Substance Use Topics  . Alcohol use: Yes    Alcohol/week: 0.0 standard drinks    Comment: occasionally- wine  . Drug use: No    Review of Systems Constitutional: No fever/chills. Positive for generalized weakness. Eyes: No visual changes. ENT: No sore throat. Cardiovascular: Denies chest pain. Respiratory: Positive for shortness of breath and cough. Gastrointestinal: No abdominal pain.  No nausea, no vomiting.  No diarrhea.   Genitourinary: Negative for dysuria. Musculoskeletal: Negative for back pain. Skin: Negative for rash. Neurological: Negative for headaches, focal weakness or numbness.  ____________________________________________   PHYSICAL EXAM:  VITAL SIGNS: ED Triage Vitals  Enc Vitals Group     BP 06/17/19 1552 92/66     Pulse Rate 06/17/19 1552 62     Resp 06/17/19 1552 (!) 21     Temp 06/17/19 1552 98.5 F (36.9 C)     Temp Source 06/17/19 1552 Oral     SpO2 06/17/19 1552 95 %     Weight 06/17/19 1554 238 lb (108 kg)     Height 06/17/19 1554 5\' 10"  (1.778 m)     Head Circumference --      Peak Flow --      Pain Score 06/17/19 1558 0   Constitutional: Alert and oriented.  Eyes: Conjunctivae are normal.  ENT      Head: Normocephalic and atraumatic.      Nose: No congestion/rhinnorhea.      Mouth/Throat: Mucous membranes are moist.       Neck: No stridor. Hematological/Lymphatic/Immunilogical: No cervical lymphadenopathy. Cardiovascular: Tachycardic, irregularly irregular rhythm.  No murmurs, rubs, or gallops.  Respiratory: Normal  respiratory effort without tachypnea nor retractions. Breath sounds are clear and equal bilaterally. No wheezes/rales/rhonchi. Gastrointestinal: Soft and non tender. No rebound. No guarding.  Genitourinary: Deferred Musculoskeletal: Normal range of motion in all extremities. No lower extremity edema. Neurologic:  Normal speech and language. No gross focal neurologic deficits are appreciated.  Skin:  Skin is warm, dry and intact. No rash noted. Psychiatric: Mood and affect are normal. Speech and behavior are normal. Patient exhibits appropriate insight and judgment.  ____________________________________________    LABS (pertinent positives/negatives)  CBC wbc 11.9, hgb 11.2, plt 368 Trop hs 5 BMP na 134, cl 97, glu 148 otherwise wnl COVID negative ____________________________________________   EKG  I, Nance Pear, attending physician, personally viewed and interpreted this EKG  EKG Time: 1558 Rate: 141 Rhythm: sinus tachycardia vs atrial flutter Axis: normal Intervals: qtc 398 QRS: narrow ST changes: no st elevation Impression: abnormal ekg ____________________________________________    RADIOLOGY  CXR No acute abnormality   ____________________________________________   PROCEDURES  Procedures  ____________________________________________   INITIAL IMPRESSION / ASSESSMENT AND PLAN / ED COURSE  Pertinent labs & imaging results that were available during my care of the patient were reviewed by me and considered in my medical decision making (see chart for details).   Patient presented to the emergency department today because of concerns for continued weakness, shortness of breath and cough.  On exam patient was found to have a fast and irregular heart rhythm.  EKG  was concerning for atrial flutter.  She was given diltiazem which did help slow her heart rate down however patient continued to have tachycardia.  Given continued tachycardia and imperfect rate control do think she would benefit from inpatient admission.   ____________________________________________   FINAL CLINICAL IMPRESSION(S) / ED DIAGNOSES  Final diagnoses:  Atrial flutter, unspecified type [facility])     Note: This dictation was prepared with Dragon dictation. Any transcriptional errors that result from this process are unintentional     Nance Pear, MD 06/17/19 2115

## 2019-06-17 NOTE — Telephone Encounter (Signed)
Called and spoke to pt's daughter, Sherry(DPR) Pt stated that she was seen by urgent care on 06/14/2019 and was prescribed doxycyline and prednisone 40mg . Pt has not yet completed course of doxy or prednisone. Per [name], pt's symptoms have not improved with treatment, in face she feels that pt's symptoms have worsen. Pt is experiencing a dry cough that is causing her to "black out", increased sob, wheezing and diarrhea x2w. Denied fever, chills or sweats. Pt had rapid covid test at urgent care on 06/14/2019 which was negative.  Pt is taking OTC cough syrup with no relief.   DK, please advise. Thanks.

## 2019-06-17 NOTE — ED Notes (Signed)
Pt sent by Dr Mortimer Fries, hx lung cancer, pt coughing so hard she is passing out, hx of COPD, rapid covid on Monday negative but MD not convinced she is negative.

## 2019-06-17 NOTE — ED Notes (Signed)
Pt taken to Xray then going to room ED 5. Will call and inform RN chrissy

## 2019-06-17 NOTE — ED Triage Notes (Addendum)
Pt comes via pOV from home with c/o SOB and cough. Pt states this stated about a week ago. Pt states it is worse at night.  Pt has wheezes present. Pt has accessory muscle use present. Pt states dry cough. Pt states she has taken mucinex with no relief. Pt states cancer in lungs and has hx of COPD.  Pt denies any CP at this time. Pt states last week she was having some chest pain.

## 2019-06-17 NOTE — ED Notes (Signed)
ED TO INPATIENT HANDOFF REPORT  ED Nurse Name and Phone #: Bascom Levels Name/Age/Gender Cathy Watts 74 y.o. female Room/Bed: ED05A/ED05A  Code Status   Code Status: Not on file  Home/SNF/Other Home Patient oriented to: self, place, time and situation Is this baseline? Yes   Triage Complete: Triage complete  Chief Complaint Atrial flutter with rapid ventricular response ([place]) [I48.92]  Triage Note Pt comes via pOV from home with c/o SOB and cough. Pt states this stated about a week ago. Pt states it is worse at night.  Pt has wheezes present. Pt has accessory muscle use present. Pt states dry cough. Pt states she has taken mucinex with no relief. Pt states cancer in lungs and has hx of COPD.  Pt denies any CP at this time. Pt states last week she was having some chest pain.    Allergies Allergies  Allergen Reactions  . Amoxicillin Hives and Itching    Did it involve swelling of the face/tongue/throat, SOB, or low BP? No Did it involve sudden or severe rash/hives, skin peeling, or any reaction on the inside of your mouth or nose? No Did you need to seek medical attention at a hospital or doctor's office? No When did it last happen?2018 If all above answers are "NO", may proceed with cephalosporin use.       Level of Care/Admitting Diagnosis ED Disposition    ED Disposition Condition [facility] [place] [[id number]]  Level of Care: Telemetry [5]  Covid Evaluation: Confirmed COVID Negative  Date Laboratory Confirmed COVID Negative: 06/17/2019  Diagnosis: Atrial flutter with rapid ventricular response [facility]) [[id number]]  Admitting Physician: Christel Mormon [[id number]]  Attending Physician: Christel Mormon [[id number]]  Estimated length of stay: past midnight tomorrow  Certification:: I certify this patient will need inpatient services for at least 2 midnights       B Medical/Surgery History Past Medical History:  Diagnosis  Date  . Abdominal aortic aneurysm (AAA) 3.0 cm to 5.0 cm in diameter in female [facility]) 05/2018   being followed by dr. Delana Meyer. will reultrasound in 1 year  . Anxiety   . Asthma   . Barrett's esophagus   . COPD (chronic obstructive pulmonary disease) ([place])   . Depression   . GERD (gastroesophageal reflux disease)   . History of peptic ulcer disease   . Hypercholesterolemia   . Low oxygen saturation    2l hs  . Osteopenia   . Panic disorder   . Personal history of radiation therapy   . Personal history of tobacco use, presenting hazards to health 01/16/2015  . Requires supplemental oxygen 05/2018   supposed to use 2L NP at night but she currently does not.  encouraged patient to resume this  . Sleep apnea    snores significantly but has never been tested for OSA  . SOB (shortness of breath)   . 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 CERVICAL CONE BIOPSY  1990  . CHOLECYSTECTOMY  1980's  . colonoscopy  2014  . COLONOSCOPY  07/21/2012  . COLONOSCOPY WITH PROPOFOL N/A 05/12/2018   Procedure: COLONOSCOPY WITH PROPOFOL;  Surgeon: Lollie Sails, MD;  Location: [facility] ENDOSCOPY;  Service: Endoscopy;  Laterality: N/A;  . ELBOW FRACTURE SURGERY Right 2009  . ENDOBRONCHIAL ULTRASOUND N/A 02/07/2015   Procedure: ENDOBRONCHIAL ULTRASOUND;  Surgeon: Flora Lipps, MD;  Location: [facility] ORS;  Service:  Cardiopulmonary;  Laterality: N/A;  . ENDOBRONCHIAL ULTRASOUND Left 06/05/2018   Procedure: ENDOBRONCHIAL ULTRASOUND;  Surgeon: Flora Lipps, MD;  Location: [facility] ORS;  Service: Cardiopulmonary;  Laterality: Left;  . ESOPHAGOGASTRODUODENOSCOPY (EGD) WITH PROPOFOL N/A 02/26/2016   Procedure: ESOPHAGOGASTRODUODENOSCOPY (EGD) WITH PROPOFOL;  Surgeon: Lollie Sails, MD;  Location[facility] ENDOSCOPY;  Service: Endoscopy;  Laterality: N/A;  COPD, Sleep Apnea  . ESOPHAGOGASTRODUODENOSCOPY (EGD) WITH PROPOFOL N/A 05/12/2018   Procedure:  ESOPHAGOGASTRODUODENOSCOPY (EGD) WITH PROPOFOL;  Surgeon: Lollie Sails, MD;  Location: [facility] ENDOSCOPY;  Service: Endoscopy;  Laterality: N/A;  . EYE SURGERY Bilateral    cataract extractions  . FRACTURE SURGERY  2005   elbow repair  . PORTA CATH INSERTION N/A 06/17/2018   Procedure: PORTA CATH INSERTION;  Surgeon: Katha Cabal, MD;  Location: [place] CV LAB;  Service: Cardiovascular;  Laterality: N/A;  . TONSILLECTOMY  1979  . TUBAL LIGATION  1970's  . UPPER GI ENDOSCOPY       A IV Location/Drains/Wounds Patient Lines/Drains/Airways Status   Active Line/Drains/Airways    Name:   Placement date:   Placement time:   Site:   Days:   Implanted Port 06/17/18 Right Chest   06/17/18    1450    Chest   365   Peripheral IV 06/17/19 Left Antecubital   06/17/19    1608    Antecubital   less than 1          Intake/Output Last 24 hours No intake or output data in the 24 hours ending 06/17/19 2216  Labs/Imaging Results for orders placed or performed during the hospital encounter of 06/17/19 (from the past 48 hour(s))  Basic metabolic panel     Status: Abnormal   Collection Time: 06/17/19  4:02 PM  Result Value Ref Range   Sodium 134 (L) 135 - 145 mmol/L   Potassium 4.8 3.5 - 5.1 mmol/L   Chloride 97 (L) 98 - 111 mmol/L   CO2 27 22 - 32 mmol/L   Glucose, Bld 148 (H) 70 - 99 mg/dL   BUN 14 8 - 23 mg/dL   Creatinine, Ser 0.71 0.44 - 1.00 mg/dL   Calcium 9.0 8.9 - 10.3 mg/dL   GFR calc non Af Amer >60 >60 mL/min   GFR calc Af Amer >60 >60 mL/min   Anion gap 10 5 - 15    Comment: Performed at [facility], [address] [place], [place] [address]  CBC     Status: Abnormal   Collection Time: 06/17/19  4:02 PM  Result Value Ref Range   WBC 11.9 (H) 4.0 - 10.5 K/uL   RBC 3.85 (L) 3.87 - 5.11 MIL/uL   Hemoglobin 11.2 (L) 12.0 - 15.0 g/dL   HCT 34.9 (L) 36.0 - 46.0 %   MCV 90.6 80.0 - 100.0 fL   MCH 29.1 26.0 - 34.0 pg   MCHC 32.1 30.0 - 36.0 g/dL   RDW 15.8  (H) 11.5 - 15.5 %   Platelets 368 150 - 400 K/uL   nRBC 0.0 0.0 - 0.2 %    Comment: Performed at [facility], [place], [place] [address]  Troponin I (High Sensitivity)     Status: None   Collection Time: 06/17/19  4:02 PM  Result Value Ref Range   Troponin I (High Sensitivity) 6 <18 ng/L    Comment: (NOTE) Elevated high sensitivity troponin I (hsTnI) values and significant  changes across serial measurements may suggest ACS but  many other  chronic and acute conditions are known to elevate hsTnI results.  Refer to the "Links" section for chest pain algorithms and additional  guidance. Performed at [facility], [address] [place], [place] [address]   Respiratory Panel by RT PCR (Flu A&B, Covid) - Nasopharyngeal Swab     Status: None   Collection Time: 06/17/19  4:57 PM   Specimen: Nasopharyngeal Swab  Result Value Ref Range   SARS Coronavirus 2 by RT PCR NEGATIVE NEGATIVE    Comment: (NOTE) SARS-CoV-2 target nucleic acids are NOT DETECTED. The SARS-CoV-2 RNA is generally detectable in upper respiratoy specimens during the acute phase of infection. The lowest concentration of SARS-CoV-2 viral copies this assay can detect is 131 copies/mL. A negative result does not preclude SARS-Cov-2 infection and should not be used as the sole basis for treatment or other patient management decisions. A negative result may occur with  improper specimen collection/handling, submission of specimen other than nasopharyngeal swab, presence of viral mutation(s) within the areas targeted by this assay, and inadequate number of viral copies (<131 copies/mL). A negative result must be combined with clinical observations, patient history, and epidemiological information. The expected result is Negative. Fact Sheet for Patients:  PinkCheek.be Fact Sheet for Healthcare Providers:  GravelBags.it This test is  not yet ap proved or cleared by the [place] FDA and  has been authorized for detection and/or diagnosis of SARS-CoV-2 by FDA under an Emergency Use Authorization (EUA). This EUA will remain  in effect (meaning this test can be used) for the duration of the COVID-19 declaration under Section 564(b)(1) of the Act, 21 U.S.C. section 360bbb-3(b)(1), unless the authorization is terminated or revoked sooner.    Influenza A by PCR NEGATIVE NEGATIVE   Influenza B by PCR NEGATIVE NEGATIVE    Comment: (NOTE) The Xpert Xpress SARS-CoV-2/FLU/RSV assay is intended as an aid in  the diagnosis of influenza from Nasopharyngeal swab specimens and  should not be used as a sole basis for treatment. Nasal washings and  aspirates are unacceptable for Xpert Xpress SARS-CoV-2/FLU/RSV  testing. Fact Sheet for Patients: PinkCheek.be Fact Sheet for Healthcare Providers: GravelBags.it This test is not yet approved or cleared by the [place] FDA and  has been authorized for detection and/or diagnosis of SARS-CoV-2 by  FDA under an Emergency Use Authorization (EUA). This EUA will remain  in effect (meaning this test can be used) for the duration of the  Covid-19 declaration under Section 564(b)(1) of the Act, 21  U.S.C. section 360bbb-3(b)(1), unless the authorization is  terminated or revoked. Performed at [facility], [place], [place] [address]   Troponin I (High Sensitivity)     Status: None   Collection Time: 06/17/19  6:29 PM  Result Value Ref Range   Troponin I (High Sensitivity) 5 <18 ng/L    Comment: (NOTE) Elevated high sensitivity troponin I (hsTnI) values and significant  changes across serial measurements may suggest ACS but many other  chronic and acute conditions are known to elevate hsTnI results.  Refer to the "Links" section for chest pain algorithms and additional  guidance. Performed at  [facility], [address] [place], [place] [address]    DG Chest 2 View  Result Date: 06/17/2019 CLINICAL DATA:  74 year old female with shortness of breath and cough for 2 weeks. History of bilateral lung cancer, more recently on the left side last year. EXAM: CHEST - 2 VIEW COMPARISON:  Chest CT 04/12/2019  and earlier. Most recent chest radiographs 04/25/2018. FINDINGS: Both hilar contours are abnormal. That on the right appears stable since 2019. That on the left appears grossly stable from the December CT when left perihilar radiation changes were suspected. Lower lung volumes. No cardiomegaly. Right chest porta cath in place. No pneumothorax, pulmonary edema or pleural effusion. Negative visible bowel gas pattern. Stable cholecystectomy clips. No acute osseous abnormality identified. IMPRESSION: 1. Lower lung volumes, but otherwise stable chest when compared to the December CT. Perihilar post radiation changes suspected. 2. No new cardiopulmonary abnormality identified. Electronically Signed   By: Genevie Ann M.D.   On: 06/17/2019 16:44    Pending Labs Unresulted Labs (From admission, onward)   None      Vitals/Pain Today's Vitals   06/17/19 2000 06/17/19 2045 06/17/19 2130 06/17/19 2200  BP: 104/66 133/86 131/75 (!) 119/107  Pulse: (!) 111 98 (!) 120 (!) 119  Resp:  18 20 20   Temp:      TempSrc:      SpO2: 95% 97% 97% 98%  Weight:      Height:      PainSc:        Isolation Precautions Airborne and Contact precautions  Medications Medications  sodium chloride flush (NS) 0.9 % injection 3 mL (3 mLs Intravenous Not Given 06/17/19 1619)  diltiazem (CARDIZEM) 125 mg in dextrose 5% 125 mL (1 mg/mL) infusion (15 mg/hr Intravenous Rate/Dose Change 06/17/19 2209)  sodium chloride 0.9 % bolus 1,000 mL (0 mLs Intravenous Stopped 06/17/19 1823)  diltiazem (CARDIZEM) injection 15 mg (15 mg Intravenous Given 06/17/19 1825)  diltiazem (CARDIZEM) injection 10 mg (10 mg Intravenous  Given 06/17/19 1935)    Mobility walks Low fall risk   Focused Assessments Cardiac Assessment Handoff:  Cardiac Rhythm: Sinus tachycardia Lab Results  Component Value Date   TROPONINI <0.03 06/26/2017   No results found for: DDIMER Does the Patient currently have chest pain? No     R Recommendations: See Admitting Provider Note  Report given to:   Additional Notes:

## 2019-06-17 NOTE — ED Notes (Signed)
Patient denies chest pain at this time 

## 2019-06-17 NOTE — Telephone Encounter (Signed)
Per Dr. Mortimer Fries verbally- recommend ED eval as pt will need imaging, vitals monitored and possible EGK.  Pt's daughter, [name] [facility]) is aware and voiced her understanding.  Nothing further is needed.

## 2019-06-18 LAB — CBC
HCT: 34.4 % — ABNORMAL LOW (ref 36.0–46.0)
Hemoglobin: 11 g/dL — ABNORMAL LOW (ref 12.0–15.0)
MCH: 29.1 pg (ref 26.0–34.0)
MCHC: 32 g/dL (ref 30.0–36.0)
MCV: 91 fL (ref 80.0–100.0)
Platelets: 373 10*3/uL (ref 150–400)
RBC: 3.78 MIL/uL — ABNORMAL LOW (ref 3.87–5.11)
RDW: 15.9 % — ABNORMAL HIGH (ref 11.5–15.5)
WBC: 10 10*3/uL (ref 4.0–10.5)
nRBC: 0 % (ref 0.0–0.2)

## 2019-06-18 LAB — PROCALCITONIN: Procalcitonin: 0.21 ng/mL

## 2019-06-18 LAB — BASIC METABOLIC PANEL
Anion gap: 9 (ref 5–15)
BUN: 15 mg/dL (ref 8–23)
CO2: 26 mmol/L (ref 22–32)
Calcium: 8.7 mg/dL — ABNORMAL LOW (ref 8.9–10.3)
Chloride: 102 mmol/L (ref 98–111)
Creatinine, Ser: 0.69 mg/dL (ref 0.44–1.00)
GFR calc Af Amer: >60 mL/min (ref >60)
GFR calc non Af Amer: >60 mL/min (ref >60)
Glucose, Bld: 155 mg/dL — ABNORMAL HIGH (ref 70–99)
Potassium: 4.7 mmol/L (ref 3.5–5.1)
Sodium: 137 mmol/L (ref 135–145)

## 2019-06-18 MED ORDER — MELATONIN 5 MG PO TABS
5.0000 mg | ORAL_TABLET | Freq: Every evening | ORAL | Status: DC | PRN
  Administered 2019-06-18: 21:00:00 5 mg via ORAL
  Filled 2019-06-18: qty 1

## 2019-06-18 MED ORDER — NON FORMULARY: 5.0000 mg | Freq: Every evening | Status: DC | PRN

## 2019-06-18 MED ORDER — ALBUTEROL SULFATE (2.5 MG/3ML) 0.083% IN NEBU: 2.5000 mg | INHALATION_SOLUTION | RESPIRATORY_TRACT | Status: DC | PRN

## 2019-06-18 MED ORDER — DILTIAZEM HCL ER COATED BEADS 120 MG PO CP24
240.0000 mg | ORAL_CAPSULE | Freq: Every day | ORAL | Status: DC
  Administered 2019-06-18 – 2019-06-19 (×2): 240 mg via ORAL
  Filled 2019-06-18 (×2): qty 2

## 2019-06-18 MED ORDER — IPRATROPIUM-ALBUTEROL 0.5-2.5 (3) MG/3ML IN SOLN
3.0000 mL | Freq: Four times a day (QID) | RESPIRATORY_TRACT | Status: DC
  Administered 2019-06-18 – 2019-06-19 (×3): 3 mL via RESPIRATORY_TRACT
  Filled 2019-06-18 (×3): qty 3

## 2019-06-18 MED ORDER — IPRATROPIUM-ALBUTEROL 0.5-2.5 (3) MG/3ML IN SOLN
3.0000 mL | Freq: Four times a day (QID) | RESPIRATORY_TRACT | Status: DC
  Administered 2019-06-18: 3 mL via RESPIRATORY_TRACT
  Filled 2019-06-18: qty 3

## 2019-06-18 MED ORDER — IPRATROPIUM-ALBUTEROL 0.5-2.5 (3) MG/3ML IN SOLN
3.0000 mL | Freq: Three times a day (TID) | RESPIRATORY_TRACT | Status: DC
  Administered 2019-06-18: 14:00:00 3 mL via RESPIRATORY_TRACT
  Filled 2019-06-18: qty 3

## 2019-06-18 NOTE — Consult Note (Signed)
[facility] Consultation Note  Patient ID: Cathy Watts, MRN: [id number], DOB/AGE: 09/06/1945 74 y.o. Admit date: 06/17/2019   Date of Consult: 06/18/2019 Primary Physician: Mar Daring, PA-C Primary Cardiologist: None  Chief Complaint:  Chief Complaint  Patient presents with  . Shortness of Breath   Reason for Consult: Atrial flutter  HPI: 74 y.o. female with known severe chronic lung disease hypertension sleep apnea who was admitted to the hospitals of failure lung exacerbation and possible infection.  The patient has had slight improvement with antibiotics and inhalers and is slightly improved at this time.  During hospitalization admission the patient did have an EKG showing atrial flutter with variable heart rate and low voltage with poor R wave progression.  This has had somewhat control of her heart rate with diltiazem drip and she is asymptomatic.  Chest x-ray has shown low volumes but no evidence of congestive heart failure.  Troponin levels normal.  Currently the patient is breathing slightly better with wheezing but no evidence of angina or heart failure symptoms today  Past Medical History:  Diagnosis Date  . Abdominal aortic aneurysm (AAA) 3.0 cm to 5.0 cm in diameter in female [facility]) 05/2018   being followed by dr. Delana Meyer. will reultrasound in 1 year  . Anxiety   . Asthma   . Barrett's esophagus   . COPD (chronic obstructive pulmonary disease) ([place])   . Depression   . GERD (gastroesophageal reflux disease)   . History of peptic ulcer disease   . Hypercholesterolemia   . Low oxygen saturation    2l hs  . Osteopenia   . Panic disorder   . Personal history of radiation therapy   . Personal history of tobacco use, presenting hazards to health 01/16/2015  . Requires supplemental oxygen 05/2018   supposed to use 2L NP at night but she currently does not.  encouraged patient to resume this  . Sleep apnea    snores significantly but has never  been tested for OSA  . SOB (shortness of breath)   . Squamous cell carcinoma of right lung [facility]) 2015      Surgical History:  Past Surgical History:  Procedure Laterality Date  . CERVICAL CONE BIOPSY  1990  . CHOLECYSTECTOMY  1980's  . colonoscopy  2014  . COLONOSCOPY  07/21/2012  . COLONOSCOPY WITH PROPOFOL N/A 05/12/2018   Procedure: COLONOSCOPY WITH PROPOFOL;  Surgeon: Lollie Sails, MD;  Location[facility] ENDOSCOPY;  Service: Endoscopy;  Laterality: N/A;  . ELBOW FRACTURE SURGERY Right 2009  . ENDOBRONCHIAL ULTRASOUND N/A 02/07/2015   Procedure: ENDOBRONCHIAL ULTRASOUND;  Surgeon: Flora Lipps, MD;  Location: [facility] ORS;  Service: Cardiopulmonary;  Laterality: N/A;  . ENDOBRONCHIAL ULTRASOUND Left 06/05/2018   Procedure: ENDOBRONCHIAL ULTRASOUND;  Surgeon: Flora Lipps, MD;  Location: [facility] ORS;  Service: Cardiopulmonary;  Laterality: Left;  . ESOPHAGOGASTRODUODENOSCOPY (EGD) WITH PROPOFOL N/A 02/26/2016   Procedure: ESOPHAGOGASTRODUODENOSCOPY (EGD) WITH PROPOFOL;  Surgeon: Lollie Sails, MD;  Location: [facility] ENDOSCOPY;  Service: Endoscopy;  Laterality: N/A;  COPD, Sleep Apnea  . ESOPHAGOGASTRODUODENOSCOPY (EGD) WITH PROPOFOL N/A 05/12/2018   Procedure: ESOPHAGOGASTRODUODENOSCOPY (EGD) WITH PROPOFOL;  Surgeon: Lollie Sails, MD;  Location: [facility] ENDOSCOPY;  Service: Endoscopy;  Laterality: N/A;  . EYE SURGERY Bilateral    cataract extractions  . FRACTURE SURGERY  2005   elbow repair  . PORTA CATH INSERTION N/A 06/17/2018   Procedure: PORTA CATH INSERTION;  Surgeon: Katha Cabal, MD;  Location: [facility] LAB;  Service: Cardiovascular;  Laterality: N/A;  . TONSILLECTOMY  1979  . TUBAL LIGATION  1970's  . UPPER GI ENDOSCOPY       Home Meds: Prior to Admission medications   Medication Sig Start Date End Date Taking? Authorizing Provider  busPIRone (BUSPAR) 30 MG tablet Take 1 tablet by mouth twice daily 05/17/19  Yes Burnette, Clearnce Sorrel, PA-C  CALCIUM CARBONATE-VIT  D-MIN PO Take 1 tablet by mouth 2 (two) times daily.  01/02/11  Yes [provider]  clotrimazole-betamethasone (LOTRISONE) cream 1 (one) Cream Cream Apply twice daily to affected area.  Not for Internal use. Patient taking differently: Apply 1 application topically daily as needed (rash). 1 (one) Cream Cream Apply twice daily to affected area.  Not for Internal use. 05/01/18  Yes Mar Daring, PA-C  doxycycline (VIBRAMYCIN) 100 MG capsule Take 100 mg by mouth 2 (two) times daily. 06/14/19  Yes [provider]  escitalopram (LEXAPRO) 20 MG tablet Take 1 tablet by mouth once daily 05/17/19  Yes Burnette, Jennifer M, PA-C  Fluticasone-Umeclidin-Vilant (TRELEGY ELLIPTA) 100-62.5-25 MCG/INH AEPB Inhale 1 puff into the lungs daily. 12/28/18  Yes Flora Lipps, MD  furosemide (LASIX) 20 MG tablet Take 1 tablet (20 mg total) by mouth daily. 01/20/19  Yes Burnette, Anderson Malta M, PA-C  guaiFENesin-dextromethorphan (ROBITUSSIN DM) 100-10 MG/5ML syrup Take 5 mLs by mouth every 4 (four) hours as needed for cough. 05/27/19  Yes Flora Lipps, MD  lidocaine-prilocaine (EMLA) cream Apply 1 application topically as needed. 06/10/18  Yes Cammie Sickle, MD  Magnesium 250 MG TABS Take 250 mg by mouth 2 (two) times daily.    Yes [provider]  omeprazole (PRILOSEC) 40 MG capsule Take 1 capsule by mouth once daily 04/23/19  Yes Burnette, Anderson Malta M, PA-C  polyethylene glycol powder (GLYCOLAX/MIRALAX) powder TAKE 17 GRAMS OF POWDER MIXED IN 8 OUNCES OF WATER BY MOUTH ONCE A DAY. Patient taking differently: Take 1 Container by mouth daily. TAKE 17 GRAMS OF POWDER MIXED IN 8 OUNCES OF WATER BY MOUTH ONCE A DAY. 05/02/17  Yes Mar Daring, PA-C  potassium chloride SA (K-DUR) 20 MEQ tablet Take 1 tablet (20 mEq total) by mouth daily. 10/22/18  Yes Mar Daring, PA-C  pravastatin (PRAVACHOL) 20 MG tablet TAKE 1 TABLET BY MOUTH AT BEDTIME 06/14/19  Yes Burnette, Jennifer M, PA-C   predniSONE (DELTASONE) 20 MG tablet Take 40 mg by mouth daily with breakfast.   Yes [provider]  PROAIR HFA 108 (90 Base) MCG/ACT inhaler INHALE 2 PUFFS BY MOUTH EVERY 4 HOURS AS NEEDED FOR WHEEZING FOR SHORTNESS OF BREATH 06/15/19  Yes Flora Lipps, MD  Respiratory Therapy Supplies (FLUTTER) [place] 1 each by Does not apply route QID. 03/31/18  Yes Kasa, Maretta Bees, MD  rOPINIRole (REQUIP) 3 MG tablet TAKE 1 TABLET BY MOUTH AT BEDTIME 02/12/19  Yes Burnette, Jennifer M, PA-C  predniSONE (DELTASONE) 10 MG tablet Take 6 tablets PO on day 1 and day 2, take 5 tablets PO on day 3 and day 4, take 4 tablets PO on day 5 and day 6, take 3 tablets PO on day 7 and day 8, take 2 tablets PO on day 9 and day 10, take one tablet PO on day 11 and day 12. Patient not taking: Reported on 06/17/2019 04/23/19   Mar Daring, PA-C  predniSONE (DELTASONE) 20 MG tablet Take 1 tablet (20 mg total) by mouth daily with breakfast. 10 days Patient not taking: Reported on 06/17/2019 05/26/19  Flora Lipps, MD  triamcinolone cream (KENALOG) 0.1 % Apply 1 application topically 2 (two) times daily. Patient not taking: Reported on 06/17/2019 10/22/18   Mar Daring, PA-C    Inpatient Medications:  . apixaban  5 mg Oral BID  . busPIRone  30 mg Oral BID  . diltiazem  240 mg Oral Daily  . doxycycline  100 mg Oral BID  . escitalopram  20 mg Oral Daily  . furosemide  20 mg Oral Daily  . guaiFENesin  600 mg Oral BID  . ipratropium-albuterol  3 mL Nebulization QID  . magnesium oxide  200 mg Oral BID  . methylPREDNISolone (SOLU-MEDROL) injection  60 mg Intravenous Q8H  . pantoprazole  40 mg Oral Daily  . potassium chloride SA  20 mEq Oral Daily  . pravastatin  20 mg Oral QHS  . rOPINIRole  3 mg Oral QHS  . sodium chloride flush  3 mL Intravenous Once   . diltiazem (CARDIZEM) infusion 15 mg/hr (06/17/19 2209)  . diltiazem (CARDIZEM) infusion 12.5 mg/hr (06/18/19 0505)    Allergies:  Allergies  Allergen  Reactions  . Amoxicillin Hives and Itching    Did it involve swelling of the face/tongue/throat, SOB, or low BP? No Did it involve sudden or severe rash/hives, skin peeling, or any reaction on the inside of your mouth or nose? No Did you need to seek medical attention at a hospital or doctor's office? No When did it last happen?2018 If all above answers are "NO", may proceed with cephalosporin use.       Social History   Socioeconomic History  . Marital status: Widowed    Spouse name: Not on file  . Number of children: 3  . Years of education: Not on file  . Highest education level: 12th grade  Occupational History  . Occupation: Agricultural consultant    Comment: retired  Tobacco Use  . Smoking status: Former Smoker    Packs/day: 1.00    Years: 55.00    Pack years: 55.00    Types: Cigarettes    Quit date: 11/02/2017    Years since quitting: 1.6  . Smokeless tobacco: Never Used  Substance and Sexual Activity  . Alcohol use: Yes    Alcohol/week: 0.0 standard drinks    Comment: occasionally- wine  . Drug use: No  . Sexual activity: Not Currently  Other Topics Concern  . Not on file  Social History Narrative   Grandson lives with patient. Is available to help her as needed.   Social Determinants of Health   Financial Resource Strain:   . Difficulty of Paying Living Expenses: Not on file  Food Insecurity:   . Worried About Charity fundraiser in the Last Year: Not on file  . Ran Out of Food in the Last Year: Not on file  Transportation Needs:   . Lack of Transportation (Medical): Not on file  . Lack of Transportation (Non-Medical): Not on file  Physical Activity: Inactive  . Days of Exercise per Week: 0 days  . Minutes of Exercise per Session: 0 min  Stress:   . Feeling of Stress : Not on file  Social Connections: Unknown  . Frequency of Communication with Friends and Family: Patient refused  . Frequency of Social Gatherings with Friends and  Family: Patient refused  . Attends Religious Services: Patient refused  . Active Member of Clubs or Organizations: Patient refused  . Attends Archivist Meetings: Patient refused  . Marital  Status: Patient refused  Intimate Partner Violence: Unknown  . Fear of Current or Ex-Partner: Patient refused  . Emotionally Abused: Patient refused  . Physically Abused: Patient refused  . Sexually Abused: Patient refused     Family History  Problem Relation Age of Onset  . Colon cancer Mother        colon  . Diabetes Mother   . Arthritis Mother   . Glaucoma Mother   . Stomach cancer Father        stomach  . Throat cancer Brother        throat  . Breast cancer Sister        breast  . Diabetes Sister   . Cirrhosis Sister   . Cancer Paternal Aunt   . Parkinson's disease Sister   . Heart attack Sister   . Ovarian cancer Sister      Review of Systems Positive for shortness of breath cough congestion Negative for: General:  chills, fever, night sweats or weight changes.  Cardiovascular: PND orthopnea syncope dizziness  Dermatological skin lesions rashes Respiratory: Positive for cough congestion Urologic: Frequent urination urination at night and hematuria Abdominal: negative for nausea, vomiting, diarrhea, bright red blood per rectum, melena, or hematemesis Neurologic: negative for visual changes, and/or hearing changes  All other systems reviewed and are otherwise negative except as noted above.  Labs: No results for input(s): CKTOTAL, CKMB, TROPONINI in the last 72 hours. Lab Results  Component Value Date   WBC 10.0 06/18/2019   HGB 11.0 (L) 06/18/2019   HCT 34.4 (L) 06/18/2019   MCV 91.0 06/18/2019   PLT 373 06/18/2019    Recent Labs  Lab 06/18/19 0643  NA 137  K 4.7  CL 102  CO2 26  BUN 15  CREATININE 0.69  CALCIUM 8.7*  GLUCOSE 155*   Lab Results  Component Value Date   CHOL 220 (H) 05/01/2018   HDL 59 05/01/2018   LDLCALC 138 (H) 05/01/2018    TRIG 116 05/01/2018   No results found for: DDIMER  Radiology/Studies:  DG Chest 2 View  Result Date: 06/17/2019 CLINICAL DATA:  74 year old female with shortness of breath and cough for 2 weeks. History of bilateral lung cancer, more recently on the left side last year. EXAM: CHEST - 2 VIEW COMPARISON:  Chest CT 04/12/2019 and earlier. Most recent chest radiographs 04/25/2018. FINDINGS: Both hilar contours are abnormal. That on the right appears stable since 2019. That on the left appears grossly stable from the December CT when left perihilar radiation changes were suspected. Lower lung volumes. No cardiomegaly. Right chest porta cath in place. No pneumothorax, pulmonary edema or pleural effusion. Negative visible bowel gas pattern. Stable cholecystectomy clips. No acute osseous abnormality identified. IMPRESSION: 1. Lower lung volumes, but otherwise stable chest when compared to the December CT. Perihilar post radiation changes suspected. 2. No new cardiopulmonary abnormality identified. Electronically Signed   By: Genevie Ann M.D.   On: 06/17/2019 16:44    EKG: Atrial flutter with variable heart rate and poor R wave progression  Weights: Filed Weights   06/17/19 1554 06/17/19 2326  Weight: 108 kg 109.8 kg     Physical Exam: Blood pressure 120/73, pulse (!) 101, temperature 98.3 F (36.8 C), temperature source Oral, resp. rate 16, height 5\' 10"  (1.778 m), weight 109.8 kg, SpO2 96 %. Body mass index is 34.74 kg/m. General: Well developed, well nourished, in no acute distress. Head eyes ears nose throat: Normocephalic, atraumatic, sclera non-icteric, no xanthomas, nares  are without discharge. No apparent thyromegaly and/or mass  Lungs: Normal respiratory effort.  Diffuse wheezes, no rales, no rhonchi.  Heart: Irregular with normal S1 S2. no murmur gallop, no rub, PMI is normal size and placement, carotid upstroke normal without bruit, jugular venous pressure is normal Abdomen: Soft,  non-tender, non-distended with normoactive bowel sounds. No hepatomegaly. No rebound/guarding. No obvious abdominal masses. Abdominal aorta is normal size without bruit Extremities: Trace edema. no cyanosis, no clubbing, no ulcers  Peripheral : 2+ bilateral upper extremity pulses, 2+ bilateral femoral pulses, 2+ bilateral dorsal pedal pulse Neuro: Alert and oriented. No facial asymmetry. No focal deficit. Moves all extremities spontaneously. Musculoskeletal: Normal muscle tone without kyphosis Psych:  Responds to questions appropriately with a normal affect.    Assessment: 74 year old female with chronic obstructive pulmonary disease with possible exacerbation and infection sleep apnea with a new onset atrial flutter with rapid ventricular rate and no current evidence of heart failure or angina  Plan: 1.  Continue diltiazem drip as initiating oral diltiazem for heart rate control of atrial flutter with a goal heart rate below 100 bpm 2.  Anticoagulation for further risk reduction in stroke with atrial fibrillation and/or flutter with Eliquis 5 mg twice per day 3.  Consider echocardiogram for LV systolic dysfunction valvular heart disease pulmonary hypertension 4.  Continue treatment of lung disease and exacerbation without restriction 5.  Further treatment options after above  Signed, Corey Skains M.D. [facility] 06/18/2019, 3:47 PM

## 2019-06-18 NOTE — H&P (Signed)
[facility] NAME: Cathy Watts    MR#:  [id number]  DATE OF BIRTH:  07/15/1945  DATE OF ADMISSION:  06/17/2019  PRIMARY CARE PHYSICIAN: Mar Daring, PA-C   REQUESTING/REFERRING PHYSICIAN: Nance Pear, MD CHIEF COMPLAINT:   Chief Complaint  Patient presents with  . Shortness of Breath    HISTORY OF PRESENT ILLNESS:  Cathy Watts  is a 74 y.o. Caucasian female with a known history of multiple medical problems that are mentioned below including bilateral lung cancer and COPD, who presented to the emergency room with onset of dyspnea with associated right-sided chest pain without palpitations.  She denied any cough or wheezing or hemoptysis.  No loss of taste or smell.  No fever or chills.  She admits to diarrhea that has resolved.  She has been having wheezing with her cough and dyspnea.  She was apparently seen by her primary care physician during the last 3 weeks with persistent symptoms and was given a prescription for doxycycline as well as prednisone taper that significant improvement so far.  She denies any headache or dizziness or blurred vision.  Upon presentation to the emergency room, blood pressure was 141/91 with a pulse of 146 and respiratory to 34.  She was in atrial flutter with rapid ventricular response 146.  She was in atrial flutter with rapid ventricular sponsor 146 on EKG and after a total of 25 mg V Cardizem rate went down to 103 then went up again in the 130s at which time she was started on IV Cardizem drip.  Labs were remarkable for mild hyponatremia and hypochloremia and high-sensitivity troponin I was 60 letter 5.  CBC showed mild leukocytosis and mild anemia.  Her influenza antigens as well as COVID-19 PCR came back negative.  The patient will be admitted to a telemetry bed for further evaluation and management. PAST MEDICAL HISTORY:   Past Medical History:  Diagnosis Date  . Abdominal aortic aneurysm (AAA)  3.0 cm to 5.0 cm in diameter in female [facility]) 05/2018   being followed by dr. Delana Meyer. will reultrasound in 1 year  . Anxiety   . Asthma   . Barrett's esophagus   . COPD (chronic obstructive pulmonary disease) ([place])   . Depression   . GERD (gastroesophageal reflux disease)   . History of peptic ulcer disease   . Hypercholesterolemia   . Low oxygen saturation    2l hs  . Osteopenia   . Panic disorder   . Personal history of radiation therapy   . Personal history of tobacco use, presenting hazards to health 01/16/2015  . Requires supplemental oxygen 05/2018   supposed to use 2L NP at night but she currently does not.  encouraged patient to resume this  . Sleep apnea    snores significantly but has never been tested for OSA  . SOB (shortness of breath)   . 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 CERVICAL CONE BIOPSY  1990  . CHOLECYSTECTOMY  1980's  . colonoscopy  2014  . COLONOSCOPY  07/21/2012  . COLONOSCOPY WITH PROPOFOL N/A 05/12/2018   Procedure: COLONOSCOPY WITH PROPOFOL;  Surgeon: Lollie Sails, MD;  Location: [facility] ENDOSCOPY;  Service: Endoscopy;  Laterality: N/A;  . ELBOW FRACTURE SURGERY Right 2009  . ENDOBRONCHIAL ULTRASOUND N/A 02/07/2015   Procedure: ENDOBRONCHIAL ULTRASOUND;  Surgeon: Flora Lipps, MD;  Location:  [facility] ORS;  Service: Cardiopulmonary;  Laterality: N/A;  . ENDOBRONCHIAL ULTRASOUND Left 06/05/2018   Procedure: ENDOBRONCHIAL ULTRASOUND;  Surgeon: Flora Lipps, MD;  Location: [facility] ORS;  Service: Cardiopulmonary;  Laterality: Left;  . ESOPHAGOGASTRODUODENOSCOPY (EGD) WITH PROPOFOL N/A 02/26/2016   Procedure: ESOPHAGOGASTRODUODENOSCOPY (EGD) WITH PROPOFOL;  Surgeon: Lollie Sails, MD;  Location: [facility] ENDOSCOPY;  Service: Endoscopy;  Laterality: N/A;  COPD, Sleep Apnea  . ESOPHAGOGASTRODUODENOSCOPY (EGD) WITH PROPOFOL N/A 05/12/2018   Procedure:  ESOPHAGOGASTRODUODENOSCOPY (EGD) WITH PROPOFOL;  Surgeon: Lollie Sails, MD;  Location: [facility] ENDOSCOPY;  Service: Endoscopy;  Laterality: N/A;  . EYE SURGERY Bilateral    cataract extractions  . FRACTURE SURGERY  2005   elbow repair  . PORTA CATH INSERTION N/A 06/17/2018   Procedure: PORTA CATH INSERTION;  Surgeon: Katha Cabal, MD;  Location: [facility] CV LAB;  Service: Cardiovascular;  Laterality: N/A;  . TONSILLECTOMY  1979  . TUBAL LIGATION  1970's  . UPPER GI ENDOSCOPY      SOCIAL HISTORY:   Social History   Tobacco Use  . Smoking status: Former Smoker    Packs/day: 1.00    Years: 55.00    Pack years: 55.00    Types: Cigarettes    Quit date: 11/02/2017    Years since quitting: 1.6  . Smokeless tobacco: Never Used  Substance Use Topics  . Alcohol use: Yes    Alcohol/week: 0.0 standard drinks    Comment: occasionally- wine    FAMILY HISTORY:   Family History  Problem Relation Age of Onset  . Colon cancer Mother        colon  . Diabetes Mother   . Arthritis Mother   . Glaucoma Mother   . Stomach cancer Father        stomach  . Throat cancer Brother        throat  . Breast cancer Sister        breast  . Diabetes Sister   . Cirrhosis Sister   . Cancer Paternal Aunt   . Parkinson's disease Sister   . Heart attack Sister   . Ovarian cancer Sister     DRUG ALLERGIES:   Allergies  Allergen Reactions  . Amoxicillin Hives and Itching    Did it involve swelling of the face/tongue/throat, SOB, or low BP? No Did it involve sudden or severe rash/hives, skin peeling, or any reaction on the inside of your mouth or nose? No Did you need to seek medical attention at a hospital or doctor's office? No When did it last happen?2018 If all above answers are "NO", may proceed with cephalosporin use.       REVIEW OF SYSTEMS:   ROS As per history of present illness. All pertinent systems were reviewed above. Constitutional,  HEENT, cardiovascular,  respiratory, GI, GU, musculoskeletal, neuro, psychiatric, endocrine,  integumentary and hematologic systems were reviewed and are otherwise  negative/unremarkable except for positive findings mentioned above in the HPI.   MEDICATIONS AT HOME:   Prior to Admission medications   Medication Sig Start Date End Date Taking? Authorizing Provider  busPIRone (BUSPAR) 30 MG tablet Take 1 tablet by mouth twice daily 05/17/19  Yes Burnette, Clearnce Sorrel, PA-C  CALCIUM CARBONATE-VIT D-MIN PO Take 1 tablet by mouth 2 (two) times daily.  01/02/11  Yes [provider]  clotrimazole-betamethasone (LOTRISONE) cream 1 (one) Cream Cream Apply twice daily to affected area.  Not for Internal use. Patient taking differently: Apply 1 application topically daily as needed (rash).  1 (one) Cream Cream Apply twice daily to affected area.  Not for Internal use. 05/01/18  Yes Mar Daring, PA-C  doxycycline (VIBRAMYCIN) 100 MG capsule Take 100 mg by mouth 2 (two) times daily. 06/14/19  Yes [provider]  escitalopram (LEXAPRO) 20 MG tablet Take 1 tablet by mouth once daily 05/17/19  Yes Burnette, Jennifer M, PA-C  Fluticasone-Umeclidin-Vilant (TRELEGY ELLIPTA) 100-62.5-25 MCG/INH AEPB Inhale 1 puff into the lungs daily. 12/28/18  Yes Flora Lipps, MD  furosemide (LASIX) 20 MG tablet Take 1 tablet (20 mg total) by mouth daily. 01/20/19  Yes Burnette, Anderson Malta M, PA-C  guaiFENesin-dextromethorphan (ROBITUSSIN DM) 100-10 MG/5ML syrup Take 5 mLs by mouth every 4 (four) hours as needed for cough. 05/27/19  Yes Flora Lipps, MD  lidocaine-prilocaine (EMLA) cream Apply 1 application topically as needed. 06/10/18  Yes Cammie Sickle, MD  Magnesium 250 MG TABS Take 250 mg by mouth 2 (two) times daily.    Yes [provider]  omeprazole (PRILOSEC) 40 MG capsule Take 1 capsule by mouth once daily 04/23/19  Yes Burnette, Anderson Malta M, PA-C  polyethylene glycol powder (GLYCOLAX/MIRALAX) powder TAKE 17 GRAMS  OF POWDER MIXED IN 8 OUNCES OF WATER BY MOUTH ONCE A DAY. Patient taking differently: Take 1 Container by mouth daily. TAKE 17 GRAMS OF POWDER MIXED IN 8 OUNCES OF WATER BY MOUTH ONCE A DAY. 05/02/17  Yes Mar Daring, PA-C  potassium chloride SA (K-DUR) 20 MEQ tablet Take 1 tablet (20 mEq total) by mouth daily. 10/22/18  Yes Mar Daring, PA-C  pravastatin (PRAVACHOL) 20 MG tablet TAKE 1 TABLET BY MOUTH AT BEDTIME 06/14/19  Yes Burnette, Jennifer M, PA-C  predniSONE (DELTASONE) 20 MG tablet Take 40 mg by mouth daily with breakfast.   Yes [provider]  PROAIR HFA 108 (90 Base) MCG/ACT inhaler INHALE 2 PUFFS BY MOUTH EVERY 4 HOURS AS NEEDED FOR WHEEZING FOR SHORTNESS OF BREATH 06/15/19  Yes Flora Lipps, MD  Respiratory Therapy Supplies (FLUTTER) [place] 1 each by Does not apply route QID. 03/31/18  Yes Kasa, Maretta Bees, MD  rOPINIRole (REQUIP) 3 MG tablet TAKE 1 TABLET BY MOUTH AT BEDTIME 02/12/19  Yes Burnette, Jennifer M, PA-C  predniSONE (DELTASONE) 10 MG tablet Take 6 tablets PO on day 1 and day 2, take 5 tablets PO on day 3 and day 4, take 4 tablets PO on day 5 and day 6, take 3 tablets PO on day 7 and day 8, take 2 tablets PO on day 9 and day 10, take one tablet PO on day 11 and day 12. Patient not taking: Reported on 06/17/2019 04/23/19   Mar Daring, PA-C  predniSONE (DELTASONE) 20 MG tablet Take 1 tablet (20 mg total) by mouth daily with breakfast. 10 days Patient not taking: Reported on 06/17/2019 05/26/19   Flora Lipps, MD  triamcinolone cream (KENALOG) 0.1 % Apply 1 application topically 2 (two) times daily. Patient not taking: Reported on 06/17/2019 10/22/18   Mar Daring, PA-C      VITAL SIGNS:  Blood pressure 126/65, pulse 99, temperature 98.1 F (36.7 C), temperature source Oral, resp. rate 19, height 5\' 10"  (1.778 m), weight 109.8 kg, SpO2 97 %.  PHYSICAL EXAMINATION:  Physical Exam  GENERAL:  74 y.o.-year-old Caucasian female patient lying in the  bed with mild respiratory distress with conversational dyspnea. EYES: Pupils equal, round, reactive to light and accommodation. No scleral icterus. Extraocular muscles intact.  HEENT: Head atraumatic, normocephalic. Oropharynx and nasopharynx clear.  NECK:  Supple, no jugular venous distention. No thyroid enlargement, no tenderness.  LUNGS: Expiratory rhonchi with diffuse expiratory wheezes and rhonchi with diminished expiratory airflow and heart radicular breathing. CARDIOVASCULAR: Irregularly irregular tachycardic rhythm, S1, S2 normal. No murmurs, rubs, or gallops.  ABDOMEN: Soft, nondistended, nontender. Bowel sounds present. No organomegaly or mass.  EXTREMITIES: No pedal edema, cyanosis, or clubbing.  NEUROLOGIC: Cranial nerves II through XII are intact. Muscle strength 5/5 in all extremities. Sensation intact. Gait not checked.  PSYCHIATRIC: The patient is alert and oriented x 3.  Normal affect and good eye contact. SKIN: No obvious rash, lesion, or ulcer.   LABORATORY PANEL:   CBC Recent Labs  Lab 06/17/19 1602  WBC 11.9*  HGB 11.2*  HCT 34.9*  PLT 368   ------------------------------------------------------------------------------------------------------------------  Chemistries  Recent Labs  Lab 06/17/19 1602  NA 134*  K 4.8  CL 97*  CO2 27  GLUCOSE 148*  BUN 14  CREATININE 0.71  CALCIUM 9.0   ------------------------------------------------------------------------------------------------------------------  Cardiac Enzymes No results for input(s): TROPONINI in the last 168 hours. ------------------------------------------------------------------------------------------------------------------  RADIOLOGY:  DG Chest 2 View  Result Date: 06/17/2019 CLINICAL DATA:  74 year old female with shortness of breath and cough for 2 weeks. History of bilateral lung cancer, more recently on the left side last year. EXAM: CHEST - 2 VIEW COMPARISON:  Chest CT 04/12/2019 and  earlier. Most recent chest radiographs 04/25/2018. FINDINGS: Both hilar contours are abnormal. That on the right appears stable since 2019. That on the left appears grossly stable from the December CT when left perihilar radiation changes were suspected. Lower lung volumes. No cardiomegaly. Right chest porta cath in place. No pneumothorax, pulmonary edema or pleural effusion. Negative visible bowel gas pattern. Stable cholecystectomy clips. No acute osseous abnormality identified. IMPRESSION: 1. Lower lung volumes, but otherwise stable chest when compared to the December CT. Perihilar post radiation changes suspected. 2. No new cardiopulmonary abnormality identified. Electronically Signed   By: Genevie Ann M.D.   On: 06/17/2019 16:44      IMPRESSION AND PLAN:   1.  Atrial flutter with variable AV block with rapid ventricular response.  Patient will be admitted to a telemetry bed.  Should be continued on IV Cardizem drip.  A 2D echo and a cardiology consultation will be obtained in a.m.  I notified Dr. Lovena Le about the patient.  Her CHADS2 score is 2.  I will place her on p.o. Eliquis.  2.  COPD acute exacerbation.  She will be placed on steroid therapy with IV Solu-Medrol as well as DuoNebs 4 times daily and every 4 hours as needed with mucolytic therapy.  We will continue p.o. doxycycline and place her on IV Rocephin.  3.  Dyslipidemia.  We will continue statin therapy.  4.  Restless leg syndrome.  We will continue Requip.  5.  GERD.  Continue PPI therapy.  6.  DVT prophylaxis.  The patient will be on Eliquis.   All the records are reviewed and case discussed with ED provider. The plan of care was discussed in details with the patient (and family). I answered all questions. The patient agreed to proceed with the above mentioned plan. Further management will depend upon hospital course.   CODE STATUS: Full code  TOTAL TIME TAKING CARE OF THIS PATIENT: 55 minutes.    Christel Mormon M.D on  06/18/2019 at 3:25 AM  [facility]   From 7 PM-7 AM, contact night-coverage [email or web address]  CC: Primary care physician; Mar Daring,  PA-C   Note: This dictation was prepared with Dragon dictation along with smaller phrase technology. Any transcriptional errors that result from this process are unintentional.

## 2019-06-18 NOTE — Progress Notes (Signed)
PROGRESS NOTE    Cathy Watts  PFX:[id number] DOB: [birth year] DOA: 06/17/2019  PCP: Mar Daring, PA-C    LOS - 1   Brief Narrative:  Cathy Watts  is a 74 y.o. Caucasian female with a known history of multiple medical problems that are mentioned below including bilateral lung cancer and COPD, who presented to the ED on night on 2/11 with dyspnea and associated right-sided chest pain without palpitations, cough and wheezing.  Has been having several weeks of upper respiratory and COPD symptoms, PCP had prescribed prednisone taper and doxycycline, but no significant improvement.  In the ED, mildly hypertensive, HR 146, RR 34.  ECG showed A-flutter with RVR.  Patient was started on Cardizem gtt and admitted to hospitalist service for further management.  Cardiology consulted.  Also being treated with IV steroids and bronchodilators for acute exacerbation of COPD.  Subjective 2/12: Patient seen this AM at bedside.  She confirms having several weeks of dyspnea and cough with wheezing.  Denies history of abnormal heart rhythms.  No fever/chills, chest pain, worsening SOB, N/V/D or other complaints.  No acute events reported.  Assessment & Plan:   Active Problems:   Atrial flutter with rapid ventricular response (HCC)   Atrial flutter with RVR -present on admission, no known prior history. Suspect secondary to acute exacerbation of COPD. --Cardiology consulted, Dr. Lovena Le was notified by admitting physician --Continue Cardizem drip --Follow-up echo --Continue Eliquis given CHA2DS2-VASc score 2  Acute exacerbation of COPD - failed outpatient oral steroid and antibiotic. Has diffuse expiratory wheezes. --Continue Solu-Medrol 60 mg IV q8h --Continue duo nebs scheduled every 6 hours while awake --Continue albuterol nebs every 3 hours as needed --Stop Rocephin, pro-Cal negative --Will continue oral doxy for now, unable to use azithromycin due to to QT  prolongation --Antitussives as needed  Hyperlipidemia -continue home statin  Restless leg syndrome -continue home Requip  GERD -continue home PPI    DVT prophylaxis: On Eliquis   Code Status: Full Code  Family Communication: None at bedside during encounter  Disposition Plan: Expect discharge home to prior home environment pending clinical improvement and clearance by cardiology. Coming From: Home Exp DC Date: 2/14 Barriers: Clinical improvement cardiology consult Medically Stable for Discharge? No  Consultants:   Cardiology  Procedures:   None  Antimicrobials:   Rocephin 2/12, stopped  Oral doxycycline, started outpatient prior to admission and continued 2/12 >> TBD   Objective: Vitals:   06/18/19 0646 06/18/19 0751 06/18/19 0822 06/18/19 1144  BP: 119/73 120/69  99/66  Pulse: 96 98 93 81  Resp:  19 18 18   Temp:  98.3 F (36.8 C)  97.6 F (36.4 C)  TempSrc:  Oral  Oral  SpO2:  94% 94% 96%  Weight:      Height:        Intake/Output Summary (Last 24 hours) at 06/18/2019 1408 Last data filed at 06/18/2019 1300 Gross per 24 hour  Intake 904.43 ml  Output 0 ml  Net 904.43 ml   Filed Weights   06/17/19 1554 06/17/19 2326  Weight: 108 kg 109.8 kg    Examination:  General exam: awake, alert, no acute distress HEENT: atraumatic, clear conjunctiva, anicteric sclera, moist mucus membranes, hearing grossly normal  Respiratory system: Diffuse expiratory wheezes, no rales or rhonchi, mildly increased respiratory effort. Cardiovascular system: normal S1/S2, RRR, no JVD, murmurs, rubs, gallops, no pedal edema.   Central nervous system: A&O x4. no gross focal neurologic deficits, normal speech Extremities: moves all,  no cyanosis, normal tone Skin: dry, intact, normal temperature, normal color Psychiatry: normal mood, congruent affect, judgement and insight appear normal    Data Reviewed: I have personally reviewed following labs and imaging  studies  CBC: Recent Labs  Lab 06/17/19 1602 06/18/19 0643  WBC 11.9* 10.0  HGB 11.2* 11.0*  HCT 34.9* 34.4*  MCV 90.6 91.0  PLT 368 818   Basic Metabolic Panel: Recent Labs  Lab 06/17/19 1602 06/18/19 0643  NA 134* 137  K 4.8 4.7  CL 97* 102  CO2 27 26  GLUCOSE 148* 155*  BUN 14 15  CREATININE 0.71 0.69  CALCIUM 9.0 8.7*   GFR: Estimated Creatinine Clearance: 84 mL/min (by C-G formula based on SCr of 0.69 mg/dL). Liver Function Tests: No results for input(s): AST, ALT, ALKPHOS, BILITOT, PROT, ALBUMIN in the last 168 hours. No results for input(s): LIPASE, AMYLASE in the last 168 hours. No results for input(s): AMMONIA in the last 168 hours. Coagulation Profile: No results for input(s): INR, PROTIME in the last 168 hours. Cardiac Enzymes: No results for input(s): CKTOTAL, CKMB, CKMBINDEX, TROPONINI in the last 168 hours. BNP (last 3 results) No results for input(s): PROBNP in the last 8760 hours. HbA1C: No results for input(s): HGBA1C in the last 72 hours. CBG: No results for input(s): GLUCAP in the last 168 hours. Lipid Profile: No results for input(s): CHOL, HDL, LDLCALC, TRIG, CHOLHDL, LDLDIRECT in the last 72 hours. Thyroid Function Tests: No results for input(s): TSH, T4TOTAL, FREET4, T3FREE, THYROIDAB in the last 72 hours. Anemia Panel: No results for input(s): VITAMINB12, FOLATE, FERRITIN, TIBC, IRON, RETICCTPCT in the last 72 hours. Sepsis Labs: Recent Labs  Lab 06/18/19 0800  PROCALCITON 0.21    Recent Results (from the past 240 hour(s))  Respiratory Panel by RT PCR (Flu A&B, Covid) - Nasopharyngeal Swab     Status: None   Collection Time: 06/17/19  4:57 PM   Specimen: Nasopharyngeal Swab  Result Value Ref Range Status   SARS Coronavirus 2 by RT PCR NEGATIVE NEGATIVE Final    Comment: (NOTE) SARS-CoV-2 target nucleic acids are NOT DETECTED. The SARS-CoV-2 RNA is generally detectable in upper respiratoy specimens during the acute phase of  infection. The lowest concentration of SARS-CoV-2 viral copies this assay can detect is 131 copies/mL. A negative result does not preclude SARS-Cov-2 infection and should not be used as the sole basis for treatment or other patient management decisions. A negative result may occur with  improper specimen collection/handling, submission of specimen other than nasopharyngeal swab, presence of viral mutation(s) within the areas targeted by this assay, and inadequate number of viral copies (<131 copies/mL). A negative result must be combined with clinical observations, patient history, and epidemiological information. The expected result is Negative. Fact Sheet for Patients:  PinkCheek.be Fact Sheet for Healthcare Providers:  GravelBags.it This test is not yet ap proved or cleared by the [place] FDA and  has been authorized for detection and/or diagnosis of SARS-CoV-2 by FDA under an Emergency Use Authorization (EUA). This EUA will remain  in effect (meaning this test can be used) for the duration of the COVID-19 declaration under Section 564(b)(1) of the Act, 21 U.S.C. section 360bbb-3(b)(1), unless the authorization is terminated or revoked sooner.    Influenza A by PCR NEGATIVE NEGATIVE Final   Influenza B by PCR NEGATIVE NEGATIVE Final    Comment: (NOTE) The Xpert Xpress SARS-CoV-2/FLU/RSV assay is intended as an aid in  the diagnosis of influenza from Nasopharyngeal swab  specimens and  should not be used as a sole basis for treatment. Nasal washings and  aspirates are unacceptable for Xpert Xpress SARS-CoV-2/FLU/RSV  testing. Fact Sheet for Patients: PinkCheek.be Fact Sheet for Healthcare Providers: GravelBags.it This test is not yet approved or cleared by the [place] FDA and  has been authorized for detection and/or diagnosis of SARS-CoV-2 by  FDA under  an Emergency Use Authorization (EUA). This EUA will remain  in effect (meaning this test can be used) for the duration of the  Covid-19 declaration under Section 564(b)(1) of the Act, 21  U.S.C. section 360bbb-3(b)(1), unless the authorization is  terminated or revoked. Performed at [facility], [address], [place], [place] [address]          Radiology Studies: DG Chest 2 View  Result Date: 06/17/2019 CLINICAL DATA:  74 year old female with shortness of breath and cough for 2 weeks. History of bilateral lung cancer, more recently on the left side last year. EXAM: CHEST - 2 VIEW COMPARISON:  Chest CT 04/12/2019 and earlier. Most recent chest radiographs 04/25/2018. FINDINGS: Both hilar contours are abnormal. That on the right appears stable since 2019. That on the left appears grossly stable from the December CT when left perihilar radiation changes were suspected. Lower lung volumes. No cardiomegaly. Right chest porta cath in place. No pneumothorax, pulmonary edema or pleural effusion. Negative visible bowel gas pattern. Stable cholecystectomy clips. No acute osseous abnormality identified. IMPRESSION: 1. Lower lung volumes, but otherwise stable chest when compared to the December CT. Perihilar post radiation changes suspected. 2. No new cardiopulmonary abnormality identified. Electronically Signed   By: Genevie Ann M.D.   On: 06/17/2019 16:44        Scheduled Meds: . apixaban  5 mg Oral BID  . busPIRone  30 mg Oral BID  . doxycycline  100 mg Oral BID  . escitalopram  20 mg Oral Daily  . furosemide  20 mg Oral Daily  . guaiFENesin  600 mg Oral BID  . ipratropium-albuterol  3 mL Nebulization TID  . magnesium oxide  200 mg Oral BID  . methylPREDNISolone (SOLU-MEDROL) injection  60 mg Intravenous Q8H  . pantoprazole  40 mg Oral Daily  . potassium chloride SA  20 mEq Oral Daily  . pravastatin  20 mg Oral QHS  . rOPINIRole  3 mg Oral QHS  . sodium chloride flush  3 mL  Intravenous Once   Continuous Infusions: . cefTRIAXone (ROCEPHIN)  IV 1 g (06/18/19 0113)  . diltiazem (CARDIZEM) infusion 15 mg/hr (06/17/19 2209)  . diltiazem (CARDIZEM) infusion 12.5 mg/hr (06/18/19 0505)     LOS: 1 day    Time spent: 35-40 minutes    Ezekiel Slocumb, DO [facility]   If 7PM-7AM, please contact night-coverage [email or web address] 06/18/2019, 2:08 PM

## 2019-06-19 MED ORDER — GUAIFENESIN ER 600 MG PO TB12: 600.0000 mg | ORAL_TABLET | Freq: Two times a day (BID) | ORAL | 0 refills | Status: AC

## 2019-06-19 MED ORDER — PREDNISONE 20 MG PO TABS: ORAL_TABLET | ORAL | 0 refills | Status: AC

## 2019-06-19 MED ORDER — DILTIAZEM HCL ER COATED BEADS 240 MG PO CP24: 240.0000 mg | ORAL_CAPSULE | Freq: Every day | ORAL | 1 refills | Status: DC

## 2019-06-19 MED ORDER — APIXABAN 5 MG PO TABS: 5.0000 mg | ORAL_TABLET | Freq: Two times a day (BID) | ORAL | 1 refills | Status: AC

## 2019-06-19 MED ORDER — ALBUTEROL SULFATE HFA 108 (90 BASE) MCG/ACT IN AERS: 2.0000 | INHALATION_SPRAY | RESPIRATORY_TRACT | 0 refills | Status: DC | PRN

## 2019-06-19 MED ORDER — IPRATROPIUM BROMIDE HFA 17 MCG/ACT IN AERS: 2.0000 | INHALATION_SPRAY | Freq: Four times a day (QID) | RESPIRATORY_TRACT | 2 refills | Status: AC

## 2019-06-19 MED ORDER — METHYLPREDNISOLONE SODIUM SUCC 40 MG IJ SOLR
40.0000 mg | Freq: Three times a day (TID) | INTRAMUSCULAR | Status: DC
  Administered 2019-06-19: 40 mg via INTRAVENOUS
  Filled 2019-06-19: qty 1

## 2019-06-19 NOTE — Discharge Summary (Signed)
Physician Discharge Summary  Cathy Watts:[id number] DOB: 1945/10/31 DOA: 06/17/2019  PCP: Mar Daring, PA-C  Admit date: 06/17/2019 Discharge date: 06/19/2019  Admitted From: Home Disposition:  Home  Recommendations for Outpatient Follow-up:  1. Follow up with PCP in 1-2 weeks 2. Please obtain BMP/CBC in one week 3. Please follow up with cardiology, Dr. Nehemiah Massed, next week as scheduled 4. Please follow up in [place]: No  Equipment/Devices: None   Discharge Condition: Stable   CODE STATUS: Full  Diet recommendation: Heart Healthy   Brief/Interim Summary:  SandraHighfillis a74 y.o.Caucasian femalewith a known history of multiple medical problems that are mentioned below including bilateral lung cancer and COPD, who presented to the ED on night on 2/11 with dyspnea and associated right-sided chest pain without palpitations, cough and wheezing.  Has been having several weeks of upper respiratory and COPD symptoms, PCP had prescribed prednisone taper and doxycycline, but no significant improvement.  In the ED, mildly hypertensive, HR 146, RR 34.  ECG showed A-flutter with RVR.  Patient was started on Cardizem gtt and admitted to hospitalist service for further management.  Cardiology consulted.  Also treated with IV steroids and bronchodilators for acute exacerbation of COPD.  Atrial flutter with RVR -present on admission, no known prior history. Suspect secondary to acute exacerbation of COPD.  Rate controlled with Cardizem drip initially, then transitioned to oral Cardizem CD 240 mg daily with HR's controlled.   --Cardiology consulted, Dr. Nehemiah Massed - to follow up outpatient next week --Continue Eliquis 5 mg PO BID for stroke prevention (CHA2DS2-VASc score 2) --continue flutter valve to loosen sputum  Acute exacerbation of COPD - failed outpatient oral steroid and antibiotic. Had diffuse expiratory wheezes on admission, this improved to very faint  wheezing.  Patient maintained oxygen sats on room air with ambulation during PT evaluation, no need for continuous home oxygen at this time.  Patient to continue nocturnal oxygen as before. --treated with IV Solu-medrol & bronchodilators, improved --discharged on Prednisone taper, to finish doxy prescription from before admission, and as needed albuterol in addition to her home COPD regimen --Antitussives as needed    Discharge Diagnoses: Principal Problem:   Atrial flutter with rapid ventricular response ([place]) Active Problems:   COPD with acute exacerbation [facility])    Discharge Instructions   Discharge Instructions    Amb referral to AFIB Clinic   Complete by: As directed    Call MD for:   Complete by: As directed    Worsening shortness of breath and wheezing. Chest pain or heart racing / palpitations.   Call MD for:  temperature >100.4   Complete by: As directed    Diet - low sodium heart healthy   Complete by: As directed    Discharge instructions   Complete by: As directed    Continue the doxycycline for another 2-3 days, then stop.  Take Prednisone taper as prescribed.  If your breathing worsens as the prednisone dose gets lower, please call your doctor.  Follow up with cardiology and the A-fib clinic as scheduled.   Increase activity slowly   Complete by: As directed      Allergies as of 06/19/2019      Reactions   Amoxicillin Hives, Itching   Did it involve swelling of the face/tongue/throat, SOB, or low BP? No Did it involve sudden or severe rash/hives, skin peeling, or any reaction on the inside of your mouth or nose? No Did you need to seek medical attention  at a hospital or doctor's office? No When did it last happen?2018 If all above answers are "NO", may proceed with cephalosporin use.      Medication List    STOP taking these medications   triamcinolone cream 0.1 % Commonly known as: KENALOG     TAKE these medications   apixaban 5 MG Tabs  tablet Commonly known as: ELIQUIS Take 1 tablet (5 mg total) by mouth 2 (two) times daily.   busPIRone 30 MG tablet Commonly known as: BUSPAR Take 1 tablet by mouth twice daily   CALCIUM CARBONATE-VIT D-MIN PO Take 1 tablet by mouth 2 (two) times daily.   clotrimazole-betamethasone cream Commonly known as: Lotrisone 1 (one) Cream Cream Apply twice daily to affected area.  Not for Internal use. What changed:   how much to take  how to take this  when to take this  reasons to take this   diltiazem 240 MG 24 hr capsule Commonly known as: CARDIZEM CD Take 1 capsule (240 mg total) by mouth daily. Start taking on: June 20, 2019   doxycycline 100 MG capsule Commonly known as: VIBRAMYCIN Take 100 mg by mouth 2 (two) times daily.   escitalopram 20 MG tablet Commonly known as: LEXAPRO Take 1 tablet by mouth once daily   Flutter Devi 1 each by Does not apply route QID.   furosemide 20 MG tablet Commonly known as: LASIX Take 1 tablet (20 mg total) by mouth daily.   guaiFENesin 600 MG 12 hr tablet Commonly known as: MUCINEX Take 1 tablet (600 mg total) by mouth 2 (two) times daily for 7 days.   guaiFENesin-dextromethorphan 100-10 MG/5ML syrup Commonly known as: ROBITUSSIN DM Take 5 mLs by mouth every 4 (four) hours as needed for cough.   ipratropium 17 MCG/ACT inhaler Commonly known as: ATROVENT HFA Inhale 2 puffs into the lungs 4 (four) times daily.   lidocaine-prilocaine cream Commonly known as: EMLA Apply 1 application topically as needed.   Magnesium 250 MG Tabs Take 250 mg by mouth 2 (two) times daily.   omeprazole 40 MG capsule Commonly known as: PRILOSEC Take 1 capsule by mouth once daily   polyethylene glycol powder 17 GM/SCOOP powder Commonly known as: GLYCOLAX/MIRALAX TAKE 17 GRAMS OF POWDER MIXED IN 8 OUNCES OF WATER BY MOUTH ONCE A DAY. What changed:   how much to take  how to take this  when to take this   potassium chloride SA 20 MEQ  tablet Commonly known as: KLOR-CON Take 1 tablet (20 mEq total) by mouth daily.   pravastatin 20 MG tablet Commonly known as: PRAVACHOL TAKE 1 TABLET BY MOUTH AT BEDTIME   predniSONE 20 MG tablet Commonly known as: DELTASONE Take 3 tablets (60 mg total) by mouth daily for 1 day, THEN 2 tablets (40 mg total) daily for 2 days, THEN 1 tablet (20 mg total) daily for 2 days. Start taking on: June 19, 2019 What changed:   medication strength  See the new instructions.  Another medication with the same name was removed. Continue taking this medication, and follow the directions you see here.   ProAir HFA 108 (90 Base) MCG/ACT inhaler Generic drug: albuterol INHALE 2 PUFFS BY MOUTH EVERY 4 HOURS AS NEEDED FOR WHEEZING FOR SHORTNESS OF BREATH What changed: Another medication with the same name was added. Make sure you understand how and when to take each.   albuterol 108 (90 Base) MCG/ACT inhaler Commonly known as: VENTOLIN HFA Inhale 2 puffs into the lungs  every 2 (two) hours as needed for wheezing or shortness of breath. What changed: You were already taking a medication with the same name, and this prescription was added. Make sure you understand how and when to take each.   rOPINIRole 3 MG tablet Commonly known as: REQUIP TAKE 1 TABLET BY MOUTH AT BEDTIME   Trelegy Ellipta 100-62.5-25 MCG/INH Aepb Generic drug: Fluticasone-Umeclidin-Vilant Inhale 1 puff into the lungs daily.       Allergies  Allergen Reactions  . Amoxicillin Hives and Itching    Did it involve swelling of the face/tongue/throat, SOB, or low BP? No Did it involve sudden or severe rash/hives, skin peeling, or any reaction on the inside of your mouth or nose? No Did you need to seek medical attention at a hospital or doctor's office? No When did it last happen?2018 If all above answers are "NO", may proceed with cephalosporin use.       Consultations:  Cardiology - Dr. Nehemiah Massed     Procedures/Studies: DG Chest 2 View  Result Date: 06/17/2019 CLINICAL DATA:  74 year old female with shortness of breath and cough for 2 weeks. History of bilateral lung cancer, more recently on the left side last year. EXAM: CHEST - 2 VIEW COMPARISON:  Chest CT 04/12/2019 and earlier. Most recent chest radiographs 04/25/2018. FINDINGS: Both hilar contours are abnormal. That on the right appears stable since 2019. That on the left appears grossly stable from the December CT when left perihilar radiation changes were suspected. Lower lung volumes. No cardiomegaly. Right chest porta cath in place. No pneumothorax, pulmonary edema or pleural effusion. Negative visible bowel gas pattern. Stable cholecystectomy clips. No acute osseous abnormality identified. IMPRESSION: 1. Lower lung volumes, but otherwise stable chest when compared to the December CT. Perihilar post radiation changes suspected. 2. No new cardiopulmonary abnormality identified. Electronically Signed   By: Genevie Ann M.D.   On: 06/17/2019 16:44       Subjective: Patient seen this AM.  Reports feeling much better.  Still short of breath up moving around, but improved.  Denies fever/chills, chest pain, palpitations or other complaints.  Asks about going home today.   Discharge Exam: Vitals:   06/19/19 0416 06/19/19 0819  BP: 115/61 128/83  Pulse: 93 94  Resp:  18  Temp: 98.3 F (36.8 C) 97.6 F (36.4 C)  SpO2: 93% 95%   Vitals:   06/18/19 1911 06/18/19 2107 06/19/19 0416 06/19/19 0819  BP: 102/72  115/61 128/83  Pulse: 100  93 94  Resp:    18  Temp: 97.7 F (36.5 C)  98.3 F (36.8 C) 97.6 F (36.4 C)  TempSrc: Oral  Oral Oral  SpO2: 95% 94% 93% 95%  Weight:      Height:        General: Pt is alert, awake, not in acute distress Cardiovascular: RRR, S1/S2 +, no rubs, no gallops Respiratory: faint expiratory wheezes upper lung fields, no rhonchi, normal respiratory effort, on room air Abdominal: Soft, NT, ND, bowel  sounds + Extremities: no edema, no cyanosis    The results of significant diagnostics from this hospitalization (including imaging, microbiology, ancillary and laboratory) are listed below for reference.     Microbiology: Recent Results (from the past 240 hour(s))  Respiratory Panel by RT PCR (Flu A&B, Covid) - Nasopharyngeal Swab     Status: None   Collection Time: 06/17/19  4:57 PM   Specimen: Nasopharyngeal Swab  Result Value Ref Range Status   SARS Coronavirus 2 by  RT PCR NEGATIVE NEGATIVE Final    Comment: (NOTE) SARS-CoV-2 target nucleic acids are NOT DETECTED. The SARS-CoV-2 RNA is generally detectable in upper respiratoy specimens during the acute phase of infection. The lowest concentration of SARS-CoV-2 viral copies this assay can detect is 131 copies/mL. A negative result does not preclude SARS-Cov-2 infection and should not be used as the sole basis for treatment or other patient management decisions. A negative result may occur with  improper specimen collection/handling, submission of specimen other than nasopharyngeal swab, presence of viral mutation(s) within the areas targeted by this assay, and inadequate number of viral copies (<131 copies/mL). A negative result must be combined with clinical observations, patient history, and epidemiological information. The expected result is Negative. Fact Sheet for Patients:  PinkCheek.be Fact Sheet for Healthcare Providers:  GravelBags.it This test is not yet ap proved or cleared by the [place] FDA and  has been authorized for detection and/or diagnosis of SARS-CoV-2 by FDA under an Emergency Use Authorization (EUA). This EUA will remain  in effect (meaning this test can be used) for the duration of the COVID-19 declaration under Section 564(b)(1) of the Act, 21 U.S.C. section 360bbb-3(b)(1), unless the authorization is terminated or revoked sooner.     Influenza A by PCR NEGATIVE NEGATIVE Final   Influenza B by PCR NEGATIVE NEGATIVE Final    Comment: (NOTE) The Xpert Xpress SARS-CoV-2/FLU/RSV assay is intended as an aid in  the diagnosis of influenza from Nasopharyngeal swab specimens and  should not be used as a sole basis for treatment. Nasal washings and  aspirates are unacceptable for Xpert Xpress SARS-CoV-2/FLU/RSV  testing. Fact Sheet for Patients: PinkCheek.be Fact Sheet for Healthcare Providers: GravelBags.it This test is not yet approved or cleared by the [place] FDA and  has been authorized for detection and/or diagnosis of SARS-CoV-2 by  FDA under an Emergency Use Authorization (EUA). This EUA will remain  in effect (meaning this test can be used) for the duration of the  Covid-19 declaration under Section 564(b)(1) of the Act, 21  U.S.C. section 360bbb-3(b)(1), unless the authorization is  terminated or revoked. Performed at [facility], [address] [place], [place] [address]      Labs: BNP (last 3 results) No results for input(s): BNP in the last 8760 hours. Basic Metabolic Panel: Recent Labs  Lab 06/17/19 1602 06/18/19 0643  NA 134* 137  K 4.8 4.7  CL 97* 102  CO2 27 26  GLUCOSE 148* 155*  BUN 14 15  CREATININE 0.71 0.69  CALCIUM 9.0 8.7*   Liver Function Tests: No results for input(s): AST, ALT, ALKPHOS, BILITOT, PROT, ALBUMIN in the last 168 hours. No results for input(s): LIPASE, AMYLASE in the last 168 hours. No results for input(s): AMMONIA in the last 168 hours. CBC: Recent Labs  Lab 06/17/19 1602 06/18/19 0643  WBC 11.9* 10.0  HGB 11.2* 11.0*  HCT 34.9* 34.4*  MCV 90.6 91.0  PLT 368 373   Cardiac Enzymes: No results for input(s): CKTOTAL, CKMB, CKMBINDEX, TROPONINI in the last 168 hours. BNP: Invalid input(s): POCBNP CBG: No results for input(s): GLUCAP in the last 168 hours. D-Dimer No results for  input(s): DDIMER in the last 72 hours. Hgb A1c No results for input(s): HGBA1C in the last 72 hours. Lipid Profile No results for input(s): CHOL, HDL, LDLCALC, TRIG, CHOLHDL, LDLDIRECT in the last 72 hours. Thyroid function studies No results for input(s): TSH, T4TOTAL, T3FREE, THYROIDAB in the last 72 hours.  Invalid  input(s): FREET3 Anemia work up No results for input(s): VITAMINB12, FOLATE, FERRITIN, TIBC, IRON, RETICCTPCT in the last 72 hours. Urinalysis    Component Value Date/Time   COLORURINE YELLOW (A) 09/18/2016 1336   APPEARANCEUR CLEAR (A) 09/18/2016 1336   LABSPEC 1.015 09/18/2016 1336   PHURINE 6.0 09/18/2016 1336   GLUCOSEU NEGATIVE 09/18/2016 1336   HGBUR SMALL (A) 09/18/2016 1336   BILIRUBINUR NEGATIVE 09/18/2016 [place] 09/18/2016 1336   PROTEINUR NEGATIVE 09/18/2016 1336   NITRITE NEGATIVE 09/18/2016 1336   LEUKOCYTESUR MODERATE (A) 09/18/2016 1336   Sepsis Labs Invalid input(s): PROCALCITONIN,  WBC,  LACTICIDVEN Microbiology Recent Results (from the past 240 hour(s))  Respiratory Panel by RT PCR (Flu A&B, Covid) - Nasopharyngeal Swab     Status: None   Collection Time: 06/17/19  4:57 PM   Specimen: Nasopharyngeal Swab  Result Value Ref Range Status   SARS Coronavirus 2 by RT PCR NEGATIVE NEGATIVE Final    Comment: (NOTE) SARS-CoV-2 target nucleic acids are NOT DETECTED. The SARS-CoV-2 RNA is generally detectable in upper respiratoy specimens during the acute phase of infection. The lowest concentration of SARS-CoV-2 viral copies this assay can detect is 131 copies/mL. A negative result does not preclude SARS-Cov-2 infection and should not be used as the sole basis for treatment or other patient management decisions. A negative result may occur with  improper specimen collection/handling, submission of specimen other than nasopharyngeal swab, presence of viral mutation(s) within the areas targeted by this assay, and inadequate number of  viral copies (<131 copies/mL). A negative result must be combined with clinical observations, patient history, and epidemiological information. The expected result is Negative. Fact Sheet for Patients:  PinkCheek.be Fact Sheet for Healthcare Providers:  GravelBags.it This test is not yet ap proved or cleared by the [place] FDA and  has been authorized for detection and/or diagnosis of SARS-CoV-2 by FDA under an Emergency Use Authorization (EUA). This EUA will remain  in effect (meaning this test can be used) for the duration of the COVID-19 declaration under Section 564(b)(1) of the Act, 21 U.S.C. section 360bbb-3(b)(1), unless the authorization is terminated or revoked sooner.    Influenza A by PCR NEGATIVE NEGATIVE Final   Influenza B by PCR NEGATIVE NEGATIVE Final    Comment: (NOTE) The Xpert Xpress SARS-CoV-2/FLU/RSV assay is intended as an aid in  the diagnosis of influenza from Nasopharyngeal swab specimens and  should not be used as a sole basis for treatment. Nasal washings and  aspirates are unacceptable for Xpert Xpress SARS-CoV-2/FLU/RSV  testing. Fact Sheet for Patients: PinkCheek.be Fact Sheet for Healthcare Providers: GravelBags.it This test is not yet approved or cleared by the [place] FDA and  has been authorized for detection and/or diagnosis of SARS-CoV-2 by  FDA under an Emergency Use Authorization (EUA). This EUA will remain  in effect (meaning this test can be used) for the duration of the  Covid-19 declaration under Section 564(b)(1) of the Act, 21  U.S.C. section 360bbb-3(b)(1), unless the authorization is  terminated or revoked. Performed at [facility], [address] [place], [place] [address]      Time coordinating discharge: Over 30 minutes  SIGNED:   Ezekiel Slocumb, DO [facility] 06/19/2019, 2:29  PM   If 7PM-7AM, please contact night-coverage [email or web address]

## 2019-06-19 NOTE — Progress Notes (Signed)
Written and verbal discharge instructions discussed with pt. Discussed medication, follow-up appts, and when to call MD or return to ER. Pt verbalized understanding. IV and tele dc'd. Belongings with pt. Pt to front entrance via wheelchair by RN where dtr was waiting.

## 2019-06-19 NOTE — Evaluation (Signed)
Physical Therapy Evaluation Patient Details Name: Cathy Watts MRN: [id number] DOB: 20-Mar-1946 Today's Date: 06/19/2019   History of Present Illness  74 yo female with onset of a flutter and exacerbation of COPD was admitted, referred to PT.  Has some hypoxia and elevated HR but is walking without assist.  PMHx:  panic attacks, osteopenia, COPD, PUD, asthma, AAA, O2 at home,   Clinical Impression  Pt was seen for mobility of gait and transfers, and to educate her about the limits of her tolerances for mobility.  Pt is on O2 at night at home, and is able to maintain sats to 90% with gait on room air.  Follow acutely for gait and sat ck's, to monitor balance and safety.  Follow up with PCP if further therapy needed, but for now do not recommend.    Follow Up Recommendations No PT follow up    Equipment Recommendations  None recommended by PT    Recommendations for Other Services       Precautions / Restrictions Precautions Precautions: Fall Precaution Comments: monitor HR and O2 sats Restrictions Weight Bearing Restrictions: No      Mobility  Bed Mobility Overal bed mobility: Modified Independent                Transfers Overall transfer level: Modified independent Equipment used: None                Ambulation/Gait Ambulation/Gait assistance: Supervision(for safety) Gait Distance (Feet): 300 Feet Assistive device: None Gait Pattern/deviations: Step-through pattern;Wide base of support;Decreased stride length Gait velocity: reduced Gait velocity interpretation: <1.31 ft/sec, indicative of household ambulator General Gait Details: pt is careful, no supplemental O2 used  Financial trader [place] (Stroke Patients Only)       Balance Overall balance assessment: Needs assistance Sitting-balance support: Feet supported Sitting balance-Leahy Scale: Good     Standing balance support: No upper extremity  supported Standing balance-Leahy Scale: Fair Standing balance comment: fair balance with gait                              Pertinent Vitals/Pain Pain Assessment: No/denies pain    Home Living Family/patient expects to be discharged to:: Private residence Living Arrangements: Children Available Help at Discharge: Family;Available PRN/intermittently Type of Home: House Home Access: Stairs to enter Entrance Stairs-Rails: Right;Left;Can reach both Entrance Stairs-Number of Steps: 4 STEPS, separated with rails Home Layout: One level Home Equipment: Walker - 2 wheels;Cane - single point      Prior Function Level of Independence: Independent         Comments: uses O2 at  night      Hand Dominance   Dominant Hand: Right    Extremity/Trunk Assessment   Upper Extremity Assessment Upper Extremity Assessment: Overall WFL for tasks assessed    Lower Extremity Assessment Lower Extremity Assessment: Overall WFL for tasks assessed    Cervical / Trunk Assessment Cervical / Trunk Assessment: Normal  Communication   Communication: No difficulties  Cognition Arousal/Alertness: Awake/alert Behavior During Therapy: WFL for tasks assessed/performed Overall Cognitive Status: Within Functional Limits for tasks assessed                                 General Comments: answering questions appropriately and aware of safety  General Comments General comments (skin integrity, edema, etc.): Pt is dropping O2 sat with gait to acceptable level of 90% but is wheezing and SOB.  Pulse 121 with effort to walk    Exercises     Assessment/Plan    PT Assessment Patient needs continued PT services  PT Problem List Decreased activity tolerance;Cardiopulmonary status limiting activity;Obesity       PT Treatment Interventions DME instruction;Gait training;Stair training;Functional mobility training;Therapeutic activities;Therapeutic exercise;Balance  training;Neuromuscular re-education;Patient/family education    PT Goals (Current goals can be found in the Care Plan section)  Acute Rehab PT Goals Patient Stated Goal: to walk and get home today PT Goal Formulation: With patient Time For Goal Achievement: 07/03/19 Potential to Achieve Goals: Good    Frequency Min 2X/week   Barriers to discharge   pt is able to walk and able to get family help at times    Co-evaluation               AM-PAC PT "6 Clicks" Mobility  Outcome Measure Help needed turning from your back to your side while in a flat bed without using bedrails?: None Help needed moving from lying on your back to sitting on the side of a flat bed without using bedrails?: None Help needed moving to and from a bed to a chair (including a wheelchair)?: A Little Help needed standing up from a chair using your arms (e.g., wheelchair or bedside chair)?: A Little Help needed to walk in hospital room?: A Little Help needed climbing 3-5 steps with a railing? : A Lot 6 Click Score: 19    End of Session Equipment Utilized During Treatment: Gait belt Activity Tolerance: Patient tolerated treatment well;Treatment limited secondary to medical complications (Comment) Patient left: in bed;with call bell/phone within reach Nurse Communication: Mobility status;Other (comment)(discharge plans) PT Visit Diagnosis: Unsteadiness on feet (R26.81)    Time: 4259-5638 PT Time Calculation (min) (ACUTE ONLY): 31 min   Charges:   PT Evaluation $PT Eval Moderate Complexity: 1 Mod PT Treatments $Gait Training: 8-22 mins       Ramond Dial 06/19/2019, 2:33 PM  Mee Hives, PT MS Acute Rehab Dept. Number: [place] and [place]

## 2019-06-19 NOTE — Progress Notes (Signed)
[facility] Encounter Note  Patient: Cathy Watts / Admit Date: 06/17/2019 / Date of Encounter: 06/19/2019, 7:43 AM   Subjective: Patient overall up from the cardiac standpoint doing fairly well hemodynamically with reasonable heart rate control of atrial flutter.  Patient does remain in atrial flutter at this time.  Patient does have diffuse wheezing and shortness of breath consistent with exacerbation of COPD.  Patient tolerating current medical regimen without any significant symptoms  Review of Systems: Positive for: Shortness of breath Negative for: Vision change, hearing change, syncope, dizziness, nausea, vomiting,diarrhea, bloody stool, stomach pain, cough, congestion, diaphoresis, urinary frequency, urinary pain,skin lesions, skin rashes Others previously listed  Objective: Telemetry: Atrial flutter with controlled ventricular rate Physical Exam: Blood pressure 115/61, pulse 93, temperature 98.3 F (36.8 C), temperature source Oral, resp. rate 16, height 5\' 10"  (1.778 m), weight 109.8 kg, SpO2 93 %. Body mass index is 34.74 kg/m. General: Well developed, well nourished, in no acute distress. Head: Normocephalic, atraumatic, sclera non-icteric, no xanthomas, nares are without discharge. Neck: No apparent masses Lungs: Normal respirations wi diffuse wheezes, few new rhonchi, no rales , no crackles   Heart: Irregular rate and rhythm, normal S1 S2, no murmur, no rub, no gallop, PMI is normal size and placement, carotid upstroke normal without bruit, jugular venous pressure normal Abdomen: Soft, non-tender, non-distended with normoactive bowel sounds. No hepatosplenomegaly. Abdominal aorta is normal size without bruit Extremities: Trace edema, no clubbing, no cyanosis, no ulcers,  Peripheral: 2+ radial, 2+ femoral, 2+ dorsal pedal pulses Neuro: Alert and oriented. Moves all extremities spontaneously. Psych:  Responds to questions appropriately with a normal  affect.   Intake/Output Summary (Last 24 hours) at 06/19/2019 0743 Last data filed at 06/19/2019 0527 Gross per 24 hour  Intake 720 ml  Output 100 ml  Net 620 ml    Inpatient Medications:  . apixaban  5 mg Oral BID  . busPIRone  30 mg Oral BID  . diltiazem  240 mg Oral Daily  . doxycycline  100 mg Oral BID  . escitalopram  20 mg Oral Daily  . furosemide  20 mg Oral Daily  . guaiFENesin  600 mg Oral BID  . ipratropium-albuterol  3 mL Nebulization Q6H WA  . magnesium oxide  200 mg Oral BID  . methylPREDNISolone (SOLU-MEDROL) injection  60 mg Intravenous Q8H  . pantoprazole  40 mg Oral Daily  . potassium chloride SA  20 mEq Oral Daily  . pravastatin  20 mg Oral QHS  . rOPINIRole  3 mg Oral QHS  . sodium chloride flush  3 mL Intravenous Once   Infusions:   Labs: Recent Labs    06/17/19 1602 06/18/19 0643  NA 134* 137  K 4.8 4.7  CL 97* 102  CO2 27 26  GLUCOSE 148* 155*  BUN 14 15  CREATININE 0.71 0.69  CALCIUM 9.0 8.7*   No results for input(s): AST, ALT, ALKPHOS, BILITOT, PROT, ALBUMIN in the last 72 hours. Recent Labs    06/17/19 1602 06/18/19 0643  WBC 11.9* 10.0  HGB 11.2* 11.0*  HCT 34.9* 34.4*  MCV 90.6 91.0  PLT 368 373   No results for input(s): CKTOTAL, CKMB, TROPONINI in the last 72 hours. Invalid input(s): POCBNP No results for input(s): HGBA1C in the last 72 hours.   Weights: Filed Weights   06/17/19 1554 06/17/19 2326  Weight: 108 kg 109.8 kg     Radiology/Studies:  DG Chest 2 View  Result Date: 06/17/2019 CLINICAL  DATA:  74 year old female with shortness of breath and cough for 2 weeks. History of bilateral lung cancer, more recently on the left side last year. EXAM: CHEST - 2 VIEW COMPARISON:  Chest CT 04/12/2019 and earlier. Most recent chest radiographs 04/25/2018. FINDINGS: Both hilar contours are abnormal. That on the right appears stable since 2019. That on the left appears grossly stable from the December CT when left perihilar  radiation changes were suspected. Lower lung volumes. No cardiomegaly. Right chest porta cath in place. No pneumothorax, pulmonary edema or pleural effusion. Negative visible bowel gas pattern. Stable cholecystectomy clips. No acute osseous abnormality identified. IMPRESSION: 1. Lower lung volumes, but otherwise stable chest when compared to the December CT. Perihilar post radiation changes suspected. 2. No new cardiopulmonary abnormality identified. Electronically Signed   By: Genevie Ann M.D.   On: 06/17/2019 16:44     Assessment and Recommendation  74 y.o. female with acute on chronic obstructive pulmonary disease with sleep apnea having some hypoxia and onset of atrial flutter with rapid ventricular rate now improved on medication management without evidence of heart failure or myocardial infarction 1.  Continue oral diltiazem for heart rate control with a goal heart rate below 100 bpm 2.  Discontinuation of diltiazem drip if able today 3.  Begin ambulation and follow for improvements of symptoms and adjustments of medication management 4.  Continue anticoagulation for further risk reduction in stroke with atrial fibrillation 5.  No further cardiac diagnostics necessary at this time 6.  Okay for discharge home if ambulating well with appropriate medication listed as above and continued conservative therapy for pulmonary infection 7.  If ambulating well with follow-up with adjustments of medication management early next week  Signed, Serafina Royals M.D. FACC

## 2019-06-21 ENCOUNTER — Ambulatory Visit (INDEPENDENT_AMBULATORY_CARE_PROVIDER_SITE_OTHER): Payer: Medicare Other | Admitting: Physician Assistant

## 2019-06-21 ENCOUNTER — Encounter: Payer: Self-pay | Admitting: Physician Assistant

## 2019-06-21 ENCOUNTER — Other Ambulatory Visit: Payer: Self-pay

## 2019-06-21 ENCOUNTER — Telehealth: Payer: Self-pay

## 2019-06-21 VITALS — BP 148/83 | HR 105 | Temp 97.2°F | Resp 16 | Wt 247.2 lb

## 2019-06-21 DIAGNOSIS — B370 Candidal stomatitis: Secondary | ICD-10-CM

## 2019-06-21 DIAGNOSIS — I483 Typical atrial flutter: Secondary | ICD-10-CM

## 2019-06-21 DIAGNOSIS — J441 Chronic obstructive pulmonary disease with (acute) exacerbation: Secondary | ICD-10-CM | POA: Diagnosis not present

## 2019-06-21 MED ORDER — NYSTATIN 100000 UNIT/ML MT SUSP: 5.0000 mL | Freq: Four times a day (QID) | OROMUCOSAL | 0 refills | Status: DC

## 2019-06-21 NOTE — Progress Notes (Signed)
Patient: Cathy Watts Female    DOB: 1946-01-22   74 y.o.   MRN: [id number] Visit Date: 06/24/2019  Today's Provider: Mar Daring, PA-C   Chief Complaint  Patient presents with  . Hospitalization Follow-up   Subjective:     HPI   Follow up Hospitalization  Patient was admitted to [facility] on 06/17/2019 and discharged on 06/19/2019 She was treated for Atrial flutter with rapid ventricular response. Treatment for this included rate controlled with Cardizem drip initially, then transitioned to oral Cardizem CD 240 mg daily with HR's controlled. Continue Eliquis 5 mg PO BID for stroke prevention  continue flutter valve to loosen sputum  Acute exacerbation of COPD:  treated with IV Solu-medrol & bronchodilators, improved  discharged on Prednisone taper, to finish doxy prescription from before admission, and as needed albuterol in addition to her home COPD regimen Telephone follow up was done on 06/21/2019 She reports good compliance with treatment. She reports this condition is Improved.  ---------------------------------------------------------------------------------     Allergies  Allergen Reactions  . Amoxicillin Hives and Itching    Did it involve swelling of the face/tongue/throat, SOB, or low BP? No Did it involve sudden or severe rash/hives, skin peeling, or any reaction on the inside of your mouth or nose? No Did you need to seek medical attention at a hospital or doctor's office? No When did it last happen?2018 If all above answers are "NO", may proceed with cephalosporin use.        Current Outpatient Medications:  .  albuterol (VENTOLIN HFA) 108 (90 Base) MCG/ACT inhaler, Inhale 2 puffs into the lungs every 2 (two) hours as needed for wheezing or shortness of breath., Disp: 6.7 g, Rfl: 0 .  apixaban (ELIQUIS) 5 MG TABS tablet, Take 1 tablet (5 mg total) by mouth 2 (two) times daily., Disp: 60 tablet, Rfl: 1 .  busPIRone (BUSPAR) 30 MG  tablet, Take 1 tablet by mouth twice daily, Disp: 180 tablet, Rfl: 1 .  CALCIUM CARBONATE-VIT D-MIN PO, Take 1 tablet by mouth 2 (two) times daily. Currently out of- needs to get more, Disp: , Rfl:  .  clotrimazole-betamethasone (LOTRISONE) cream, 1 (one) Cream Cream Apply twice daily to affected area.  Not for Internal use. (Patient taking differently: Apply 1 application topically daily as needed (rash). 1 (one) Cream Cream Apply twice daily to affected area.  Not for Internal use.), Disp: 45 g, Rfl: 1 .  diltiazem (CARDIZEM CD) 240 MG 24 hr capsule, Take 1 capsule (240 mg total) by mouth daily., Disp: 30 capsule, Rfl: 1 .  doxycycline (VIBRAMYCIN) 100 MG capsule, Take 100 mg by mouth 2 (two) times daily., Disp: , Rfl:  .  escitalopram (LEXAPRO) 20 MG tablet, Take 1 tablet by mouth once daily, Disp: 90 tablet, Rfl: 1 .  Fluticasone-Umeclidin-Vilant (TRELEGY ELLIPTA) 100-62.5-25 MCG/INH AEPB, Inhale 1 puff into the lungs daily., Disp: 180 each, Rfl: 3 .  furosemide (LASIX) 20 MG tablet, Take 1 tablet (20 mg total) by mouth daily., Disp: 90 tablet, Rfl: 1 .  guaiFENesin (MUCINEX) 600 MG 12 hr tablet, Take 1 tablet (600 mg total) by mouth 2 (two) times daily for 7 days., Disp: 14 tablet, Rfl: 0 .  ipratropium (ATROVENT HFA) 17 MCG/ACT inhaler, Inhale 2 puffs into the lungs 4 (four) times daily., Disp: 1 Inhaler, Rfl: 2 .  lidocaine-prilocaine (EMLA) cream, Apply 1 application topically as needed., Disp: 30 g, Rfl: 0 .  Magnesium 250 MG TABS, Take  250 mg by mouth 2 (two) times daily. , Disp: , Rfl:  .  omeprazole (PRILOSEC) 40 MG capsule, Take 1 capsule by mouth once daily, Disp: 90 capsule, Rfl: 0 .  polyethylene glycol powder (GLYCOLAX/MIRALAX) powder, TAKE 17 GRAMS OF POWDER MIXED IN 8 OUNCES OF WATER BY MOUTH ONCE A DAY. (Patient taking differently: Take 1 Container by mouth daily. TAKE 17 GRAMS OF POWDER MIXED IN 8 OUNCES OF WATER BY MOUTH ONCE A DAY.), Disp: 255 g, Rfl: 3 .  potassium chloride SA  (K-DUR) 20 MEQ tablet, Take 1 tablet (20 mEq total) by mouth daily., Disp: 30 tablet, Rfl: 3 .  pravastatin (PRAVACHOL) 20 MG tablet, TAKE 1 TABLET BY MOUTH AT BEDTIME, Disp: 90 tablet, Rfl: 1 .  predniSONE (DELTASONE) 20 MG tablet, Take 3 tablets (60 mg total) by mouth daily for 1 day, THEN 2 tablets (40 mg total) daily for 2 days, THEN 1 tablet (20 mg total) daily for 2 days., Disp: 9 tablet, Rfl: 0 .  PROAIR HFA 108 (90 Base) MCG/ACT inhaler, INHALE 2 PUFFS BY MOUTH EVERY 4 HOURS AS NEEDED FOR WHEEZING FOR SHORTNESS OF BREATH, Disp: 9 g, Rfl: 0 .  Respiratory Therapy Supplies (FLUTTER) DEVI, 1 each by Does not apply route QID., Disp: 1 each, Rfl: 0 .  rOPINIRole (REQUIP) 3 MG tablet, TAKE 1 TABLET BY MOUTH AT BEDTIME, Disp: 90 tablet, Rfl: 1 .  guaiFENesin-dextromethorphan (ROBITUSSIN DM) 100-10 MG/5ML syrup, Take 5 mLs by mouth every 4 (four) hours as needed for cough. (Patient not taking: Reported on 06/21/2019), Disp: 118 mL, Rfl: 0 .  nystatin (MYCOSTATIN) 100000 UNIT/ML suspension, Take 5 mLs (500,000 Units total) by mouth 4 (four) times daily., Disp: 240 mL, Rfl: 0  Review of Systems  Constitutional: Negative.   HENT: Negative.   Respiratory: Positive for cough, shortness of breath and wheezing. Negative for chest tightness.   Cardiovascular: Negative.   Gastrointestinal: Negative.   Neurological: Negative.     Social History   Tobacco Use  . Smoking status: Former Smoker    Packs/day: 1.00    Years: 55.00    Pack years: 55.00    Types: Cigarettes    Quit date: 11/02/2017    Years since quitting: 1.6  . Smokeless tobacco: Never Used  Substance Use Topics  . Alcohol use: Yes    Alcohol/week: 0.0 standard drinks    Comment: occasionally- wine      Objective:   BP (!) 148/83 (BP Location: Left Arm, Patient Position: Sitting, Cuff Size: Large)   Pulse (!) 105   Temp (!) 97.2 F (36.2 C) (Temporal)   Resp 16   Wt 247 lb 3.2 oz (112.1 kg)   SpO2 95%   BMI 35.47 kg/m    Vitals:   06/21/19 1607  BP: (!) 148/83  Pulse: (!) 105  Resp: 16  Temp: (!) 97.2 F (36.2 C)  TempSrc: Temporal  SpO2: 95%  Weight: 247 lb 3.2 oz (112.1 kg)  Body mass index is 35.47 kg/m.   Physical Exam Vitals reviewed.  Constitutional:      General: She is not in acute distress.    Appearance: Normal appearance. She is well-developed. She is not ill-appearing or diaphoretic.  Neck:     Thyroid: No thyromegaly.     Vascular: No JVD.     Trachea: No tracheal deviation.  Cardiovascular:     Rate and Rhythm: Normal rate and regular rhythm.     Pulses: Normal pulses.  Heart sounds: Normal heart sounds. No murmur. No friction rub. No gallop.   Pulmonary:     Effort: Pulmonary effort is normal. No respiratory distress.     Breath sounds: Decreased breath sounds and wheezing present. No rhonchi or rales.  Chest:     Chest wall: No tenderness.  Musculoskeletal:     Cervical back: Normal range of motion and neck supple.  Lymphadenopathy:     Cervical: No cervical adenopathy.  Neurological:     Mental Status: She is alert.      Results for orders placed or performed in visit on 06/21/19  CBC w/Diff/Platelet  Result Value Ref Range   WBC 18.3 (H) 3.4 - 10.8 x10E3/uL   RBC 4.44 3.77 - 5.28 x10E6/uL   Hemoglobin 12.8 11.1 - 15.9 g/dL   Hematocrit 38.6 34.0 - 46.6 %   MCV 87 79 - 97 fL   MCH 28.8 26.6 - 33.0 pg   MCHC 33.2 31.5 - 35.7 g/dL   RDW 15.1 11.7 - 15.4 %   Platelets 537 (H) 150 - 450 x10E3/uL   Neutrophils 81 Not Estab. %   Lymphs 6 Not Estab. %   Monocytes 6 Not Estab. %   Eos 0 Not Estab. %   Basos 0 Not Estab. %   Immature Cells Note    Neutrophils Absolute 15.0 (H) 1.4 - 7.0 x10E3/uL   Lymphocytes Absolute 1.1 0.7 - 3.1 x10E3/uL   Monocytes Absolute 1.1 (H) 0.1 - 0.9 x10E3/uL   EOS (ABSOLUTE) 0.0 0.0 - 0.4 x10E3/uL   Basophils Absolute 0.0 0.0 - 0.2 x10E3/uL   Hematology Comments: Note:   Basic Metabolic Panel (BMET)  Result Value Ref Range    Glucose 120 (H) 65 - 99 mg/dL   BUN 22 8 - 27 mg/dL   Creatinine, Ser 0.94 0.57 - 1.00 mg/dL   GFR calc non Af Amer 60 >59 mL/min/1.73   GFR calc Af Amer 70 >59 mL/min/1.73   BUN/Creatinine Ratio 23 12 - 28   Sodium 133 (L) 134 - 144 mmol/L   Potassium 4.9 3.5 - 5.2 mmol/L   Chloride 91 (L) 96 - 106 mmol/L   CO2 25 20 - 29 mmol/L   Calcium 9.6 8.7 - 10.3 mg/dL  Immature Cells  Result Value Ref Range   Bands(Auto) Relative 1 Not Estab. %   Metamyelocytes 3 (H) 0 - 0 %   MYELOCYTES 3 (H) 0 - 0 %       Assessment & Plan    1. COPD with acute exacerbation ([address]) Labs show increased WBC count with shift suggesting worsening infection despite being on Doxycycline. Will change therapy to Levaquin as below to cover pneumonia. Return precautions sent via mychart. Will f/u next week.  - CBC w/Diff/Platelet - Basic Metabolic Panel (BMET) - levofloxacin (LEVAQUIN) 500 MG tablet; Take 1 tablet (500 mg total) by mouth daily.  Dispense: 10 tablet; Refill: 0  2. Typical atrial flutter ([place]) New. Suspected due to heart strain from COPD exacerbation. Rate controlled on Cardizem 240mg . Sees cardiology on Weds, 06/23/19. May need to increase Cardizem as still tachycardic. Continue Eliquis 5mg  BID.  - CBC w/Diff/Platelet - Basic Metabolic Panel (BMET)  3. Thrush Noted from inhalers. Reviewed making sure to rinse mouth after inhaler use. Nystatin mouthwash sent in. Call if not improving.  - nystatin (MYCOSTATIN) 100000 UNIT/ML suspension; Take 5 mLs (500,000 Units total) by mouth 4 (four) times daily.  Dispense: 240 mL; Refill: 0     Anderson Malta  Dorothy Puffer, PA-C  [facility]

## 2019-06-21 NOTE — Patient Instructions (Addendum)
Take 2 tablets of furosemide at the same time x 4 days   Atrial Flutter  Atrial flutter is a type of abnormal heart rhythm (arrhythmia). The heart has an electrical system that tells it how to beat. In atrial flutter, the signals move rapidly in the top chambers of the heart (the atria). This makes your heart beat very fast. Atrial flutter can come and go, or it can be permanent. The goal of treatment is to prevent blood clots from forming, control your heart rate, or restore your heartbeat to a normal rhythm. If this condition is not treated, it can cause serious problems, such as a weakened heart muscle (cardiomyopathy) or a stroke. What are the causes? This condition is often caused by conditions that damage the heart's electrical system. These include:  Heart conditions and heart surgery. These include heart attacks and open-heart surgery.  Lung problems, such as COPD or a blood clot in the lung (pulmonary embolism, or PE).  Poorly controlled high blood pressure (hypertension).  Overactive thyroid (hyperthyroidism).  Diabetes. In some cases, the cause of this condition is not known. What increases the risk? You are more likely to develop this condition if:  You are an elderly adult.  You are a man.  You are overweight (obese).  You have obstructive sleep apnea.  You have a family history of atrial flutter.  You have diabetes.  You drink a lot of alcohol, especially binge drinking.  You use drugs, including cannabis.  You smoke. What are the signs or symptoms? Symptoms of this condition include:  A feeling that your heart is pounding or racing (palpitations).  Shortness of breath.  Chest pain.  Feeling dizzy or light-headed.  Fainting.  Low blood pressure (hypotension).  Fatigue.  Tiring easily during exercise or activity. In some cases, there are no symptoms. How is this diagnosed? This condition may be diagnosed with:  An electrocardiogram (ECG) to  check electrical signals of the heart.  An ambulatory cardiac monitor to record your heart's activity for a few days.  An echocardiogram to create pictures of your heart.  A transesophageal echocardiogram (TEE) to create even better pictures of your heart.  A stress test to check your blood supply while you exercise.  Imaging tests, such as a CT scan or chest X-ray.  Blood tests. How is this treated? Treatment depends on underlying conditions and how you feel when you experience atrial flutter. This condition may be treated with:  Medicines to prevent blood clots or to treat heart rate or heart rhythm problems.  Electrical cardioversion to reset the heart's rhythm.  Ablation to remove the heart tissue that sends abnormal signals.  Left atrial appendage closure to seal the area where blood clots can form. In some cases, underlying conditions will be treated. Follow these instructions at home: Medicines  Take over-the-counter and prescription medicines only as told by your health care provider.  Do not take any new medicines without talking to your health care provider.  If you are taking blood thinners: ? Talk with your health care provider before you take any medicines that contain aspirin or NSAIDs, such as ibuprofen. These medicines increase your risk for dangerous bleeding. ? Take your medicine exactly as told, at the same time every day. ? Avoid activities that could cause injury or bruising, and follow instructions about how to prevent falls. ? Wear a medical alert bracelet or carry a card that lists what medicines you take. Lifestyle  Eat heart-healthy foods. Talk with  a dietitian to make an eating plan that is right for you.  Do not use any products that contain nicotine or tobacco, such as cigarettes, e-cigarettes, and chewing tobacco. If you need help quitting, ask your health care provider.  Do not drink alcohol.  Do not use drugs, including cannabis.  Lose  weight if you are overweight or obese.  Exercise regularly as instructed by your health care provider. General instructions  Do not use diet pills unless your health care provider approves. Diet pills may make heart problems worse.  If you have obstructive sleep apnea, manage your condition as told by your health care provider.  Keep all follow-up visits as told by your health care provider. This is important. Contact a health care provider if you:  Notice a change in the rate, rhythm, or strength of your heartbeat.  Are taking a blood thinner and you notice more bruising.  Have a sudden change in weight.  Tire more easily when you exercise or do heavy work. Get help right away if you have:  Pain or pressure in your chest.  Shortness of breath.  Fainting.  Increasing sweating with no known cause.  Side effects of blood thinners, such as blood in your vomit, stool, or urine, or bleeding that cannot stop.  Any symptoms of a stroke. "BE FAST" is an easy way to remember the main warning signs of a stroke: ? B - Balance. Signs are dizziness, sudden trouble walking, or loss of balance. ? E - Eyes. Signs are trouble seeing or a sudden change in vision. ? F - Face. Signs are sudden weakness or numbness of the face, or the face or eyelid drooping on one side. ? A - Arms. Signs are weakness or numbness in an arm. This happens suddenly and usually on one side of the body. ? S - Speech. Signs are sudden trouble speaking, slurred speech, or trouble understanding what people say. ? T - Time. Time to call emergency services. Write down what time symptoms started.  Other signs of a stroke, such as: ? A sudden, severe headache with no known cause. ? Nausea or vomiting. ? Seizure.  These symptoms may represent a serious problem that is an emergency. Do not wait to see if the symptoms will go away. Get medical help right away. Call your local emergency services (911 in the [place] Do not  drive yourself to the hospital. Summary  Atrial flutter is an abnormal heart rhythm that can give you symptoms of palpitations, shortness of breath, or fatigue.  Atrial flutter is often treated with medicines to keep your heart in a normal rhythm and to prevent a stroke.  Get help right away if you cannot catch your breath, or have chest pain or pressure.  Get help right away if you have signs or symptoms of a stroke. This information is not intended to replace advice given to you by your health care provider. Make sure you discuss any questions you have with your health care provider. Document Revised: 10/14/2018 Document Reviewed: 10/14/2018 Elsevier Patient Education  [place].

## 2019-06-21 NOTE — Telephone Encounter (Signed)
No HFU scheduled.  

## 2019-06-21 NOTE — Telephone Encounter (Signed)
Transition Care Management Follow-up Telephone Call  Date of discharge and from where: [facility] on 06/19/19  How have you been since you were released from the hospital? Doing better, taking medication as prescribed. Heart rate was 90 this AM, breathing is better with inhalers. Pt's throat is still sore due to coughing (from COPD). Chest was sore this AM but now it is better. Pt is going to call and schedule a f/u with cardiologist today. Declines fever or n/v/d.  Any questions or concerns? No   Items Reviewed:  Did the pt receive and understand the discharge instructions provided? Yes   Medications obtained and verified? Yes   Any new allergies since your discharge? No   Dietary orders reviewed? Yes  Do you have support at home? Yes   Other (ie: DME, Home Health, etc) N/A  Functional Questionnaire: (I = Independent and D = Dependent)  Bathing/Dressing- I   Meal Prep- I  Eating- I  Maintaining continence- I  Transferring/Ambulation- I  Managing Meds- I   Follow up appointments reviewed:    PCP Hospital f/u appt confirmed? Yes  Scheduled to see Fenton Malling today @ 3:20 PM.  [facility] f/u appt confirmed? No, pt to call and schedule apt with cardiologist today.    Are transportation arrangements needed? No   If their condition worsens, is the pt aware to call  their PCP or go to the ED? Yes  Was the patient provided with contact information for the PCP's office or ED? Yes  Was the pt encouraged to call back with questions or concerns? Yes

## 2019-06-23 DIAGNOSIS — I483 Typical atrial flutter: Secondary | ICD-10-CM | POA: Diagnosis not present

## 2019-06-23 DIAGNOSIS — I4892 Unspecified atrial flutter: Secondary | ICD-10-CM | POA: Diagnosis not present

## 2019-06-23 DIAGNOSIS — J441 Chronic obstructive pulmonary disease with (acute) exacerbation: Secondary | ICD-10-CM | POA: Diagnosis not present

## 2019-06-23 DIAGNOSIS — R002 Palpitations: Secondary | ICD-10-CM | POA: Diagnosis not present

## 2019-06-24 ENCOUNTER — Encounter: Payer: Self-pay | Admitting: Physician Assistant

## 2019-06-24 ENCOUNTER — Telehealth: Payer: Self-pay

## 2019-06-24 LAB — CBC WITH DIFFERENTIAL/PLATELET
Basophils Absolute: 0 10*3/uL (ref 0.0–0.2)
Basos: 0 %
EOS (ABSOLUTE): 0 10*3/uL (ref 0.0–0.4)
Eos: 0 %
Hematocrit: 38.6 % (ref 34.0–46.6)
Hemoglobin: 12.8 g/dL (ref 11.1–15.9)
Lymphocytes Absolute: 1.1 10*3/uL (ref 0.7–3.1)
Lymphs: 6 %
MCH: 28.8 pg (ref 26.6–33.0)
MCHC: 33.2 g/dL (ref 31.5–35.7)
MCV: 87 fL (ref 79–97)
Monocytes Absolute: 1.1 10*3/uL — ABNORMAL HIGH (ref 0.1–0.9)
Monocytes: 6 %
Neutrophils Absolute: 15 10*3/uL — ABNORMAL HIGH (ref 1.4–7.0)
Neutrophils: 81 %
Platelets: 537 10*3/uL — ABNORMAL HIGH (ref 150–450)
RBC: 4.44 x10E6/uL (ref 3.77–5.28)
RDW: 15.1 % (ref 11.7–15.4)
WBC: 18.3 10*3/uL — ABNORMAL HIGH (ref 3.4–10.8)

## 2019-06-24 LAB — BASIC METABOLIC PANEL
BUN/Creatinine Ratio: 23 (ref 12–28)
BUN: 22 mg/dL (ref 8–27)
CO2: 25 mmol/L (ref 20–29)
Calcium: 9.6 mg/dL (ref 8.7–10.3)
Chloride: 91 mmol/L — ABNORMAL LOW (ref 96–106)
Creatinine, Ser: 0.94 mg/dL (ref 0.57–1.00)
GFR calc Af Amer: 70 mL/min/{1.73_m2} (ref >59)
GFR calc non Af Amer: 60 mL/min/{1.73_m2} (ref >59)
Glucose: 120 mg/dL — ABNORMAL HIGH (ref 65–99)
Potassium: 4.9 mmol/L (ref 3.5–5.2)
Sodium: 133 mmol/L — ABNORMAL LOW (ref 134–144)

## 2019-06-24 LAB — IMMATURE CELLS
Bands(Auto) Relative: 1 %
MYELOCYTES: 3 % — ABNORMAL HIGH (ref 0–0)
Metamyelocytes: 3 % — ABNORMAL HIGH (ref 0–0)

## 2019-06-24 MED ORDER — LEVOFLOXACIN 500 MG PO TABS: 500.0000 mg | ORAL_TABLET | Freq: Every day | ORAL | 0 refills | Status: DC

## 2019-06-24 NOTE — Telephone Encounter (Signed)
-----   Message from Mar Daring, [place] sent at 06/24/2019 11:28 AM EST ----- WBC count is much higher than from the hospital indicating continued infection. Would change antibiotic therapy to levofloxacin from doxycycline, if still taking. If symptoms worsen over the weekend please go to urgent care or ER (if severe enough). Would recommend for [place] to follow up next week. Once we are back in the office (out due to weather), I will have someone call you to schedule a f/u.

## 2019-06-24 NOTE — Telephone Encounter (Signed)
Cathy Watts advised as below. Patient reports she finished doxy yesterday. Patient has started levofloxacin today. Patient saw Dr. Nehemiah Massed yesterday and he advised her to discontinue some medications. Patient reports that she was advised to D/C simvastatin. Cathy Watts would like for you to review medication changes.

## 2019-06-25 NOTE — Telephone Encounter (Signed)
[id number] for P[facility] to give message and schedule the appt for next week. thanks

## 2019-06-25 NOTE — Telephone Encounter (Signed)
The only thing I noted was that he changed her from simvastatin to pravastatin.   I did not see any other changes, nor did he document others in his note.  I would like to see her next week to f/u her COPD exacerbation, so we can discuss then.   Can someone schedule her if she has not been scheduled yet?  Thanks.

## 2019-06-28 ENCOUNTER — Emergency Department
Admission: EM | Admit: 2019-06-28 | Discharge: 2019-06-28 | Disposition: A | Discharge status: Home / Self Care | Payer: Medicare Other | Attending: Emergency Medicine | Admitting: Emergency Medicine

## 2019-06-28 ENCOUNTER — Other Ambulatory Visit: Payer: Self-pay

## 2019-06-28 ENCOUNTER — Ambulatory Visit: Payer: Self-pay | Admitting: *Deleted

## 2019-06-28 ENCOUNTER — Encounter: Payer: Self-pay | Admitting: Emergency Medicine

## 2019-06-28 ENCOUNTER — Emergency Department: Payer: Medicare Other

## 2019-06-28 DIAGNOSIS — R531 Weakness: Secondary | ICD-10-CM | POA: Diagnosis present

## 2019-06-28 DIAGNOSIS — J441 Chronic obstructive pulmonary disease with (acute) exacerbation: Secondary | ICD-10-CM | POA: Insufficient documentation

## 2019-06-28 DIAGNOSIS — R079 Chest pain, unspecified: Secondary | ICD-10-CM | POA: Diagnosis not present

## 2019-06-28 DIAGNOSIS — Z85118 Personal history of other malignant neoplasm of bronchus and lung: Secondary | ICD-10-CM | POA: Diagnosis not present

## 2019-06-28 DIAGNOSIS — R0602 Shortness of breath: Secondary | ICD-10-CM | POA: Diagnosis not present

## 2019-06-28 DIAGNOSIS — Z87891 Personal history of nicotine dependence: Secondary | ICD-10-CM | POA: Diagnosis not present

## 2019-06-28 DIAGNOSIS — Z79899 Other long term (current) drug therapy: Secondary | ICD-10-CM | POA: Diagnosis not present

## 2019-06-28 DIAGNOSIS — Z7901 Long term (current) use of anticoagulants: Secondary | ICD-10-CM | POA: Insufficient documentation

## 2019-06-28 LAB — URINALYSIS, COMPLETE (UACMP) WITH MICROSCOPIC
Bacteria, UA: NONE SEEN
Bilirubin Urine: NEGATIVE
Glucose, UA: NEGATIVE mg/dL
Ketones, ur: NEGATIVE mg/dL
Leukocytes,Ua: NEGATIVE
Nitrite: NEGATIVE
Protein, ur: NEGATIVE mg/dL
Specific Gravity, Urine: 1.014 (ref 1.005–1.030)
pH: 5 (ref 5.0–8.0)

## 2019-06-28 LAB — CBC
HCT: 36.7 % (ref 36.0–46.0)
Hemoglobin: 12.3 g/dL (ref 12.0–15.0)
MCH: 29.4 pg (ref 26.0–34.0)
MCHC: 33.5 g/dL (ref 30.0–36.0)
MCV: 87.8 fL (ref 80.0–100.0)
Platelets: 324 10*3/uL (ref 150–400)
RBC: 4.18 MIL/uL (ref 3.87–5.11)
RDW: 15.6 % — ABNORMAL HIGH (ref 11.5–15.5)
WBC: 13.8 10*3/uL — ABNORMAL HIGH (ref 4.0–10.5)
nRBC: 0 % (ref 0.0–0.2)

## 2019-06-28 LAB — BASIC METABOLIC PANEL
Anion gap: 9 (ref 5–15)
BUN: 19 mg/dL (ref 8–23)
CO2: 27 mmol/L (ref 22–32)
Calcium: 8.3 mg/dL — ABNORMAL LOW (ref 8.9–10.3)
Chloride: 89 mmol/L — ABNORMAL LOW (ref 98–111)
Creatinine, Ser: 0.81 mg/dL (ref 0.44–1.00)
GFR calc Af Amer: >60 mL/min (ref >60)
GFR calc non Af Amer: >60 mL/min (ref >60)
Glucose, Bld: 124 mg/dL — ABNORMAL HIGH (ref 70–99)
Potassium: 4.3 mmol/L (ref 3.5–5.1)
Sodium: 125 mmol/L — ABNORMAL LOW (ref 135–145)

## 2019-06-28 LAB — TROPONIN I (HIGH SENSITIVITY): Troponin I (High Sensitivity): 5 ng/L (ref <18)

## 2019-06-28 MED ORDER — SODIUM CHLORIDE 0.9 % IV BOLUS
500.0000 mL | Freq: Once | INTRAVENOUS | Status: AC
  Administered 2019-06-28: 500 mL via INTRAVENOUS

## 2019-06-28 MED ORDER — IPRATROPIUM-ALBUTEROL 0.5-2.5 (3) MG/3ML IN SOLN
3.0000 mL | Freq: Once | RESPIRATORY_TRACT | Status: AC
  Administered 2019-06-28: 3 mL via RESPIRATORY_TRACT

## 2019-06-28 MED ORDER — PREDNISONE 20 MG PO TABS: ORAL_TABLET | ORAL | 0 refills | Status: AC

## 2019-06-28 MED ORDER — IPRATROPIUM-ALBUTEROL 0.5-2.5 (3) MG/3ML IN SOLN
3.0000 mL | Freq: Once | RESPIRATORY_TRACT | Status: AC
  Administered 2019-06-28: 18:00:00 3 mL via RESPIRATORY_TRACT
  Filled 2019-06-28: qty 6

## 2019-06-28 MED ORDER — METHYLPREDNISOLONE SODIUM SUCC 125 MG IJ SOLR
125.0000 mg | Freq: Once | INTRAMUSCULAR | Status: AC
  Administered 2019-06-28: 125 mg via INTRAVENOUS
  Filled 2019-06-28: qty 2

## 2019-06-28 NOTE — ED Provider Notes (Signed)
[facility] Emergency Department Provider Note ____________________________________________   First MD Initiated Contact with Patient 06/28/19 1703     (approximate)  I have reviewed the triage vital signs and the nursing notes.   HISTORY  Chief Complaint Weakness    HPI Cathy Watts is a 74 y.o. female with PMH as noted below including COPD presents with persistent generalized weakness and shortness of breath since she was discharged from the hospital on 2/13.  The patient states that she finished her steroid taper and antibiotic but has not been feeling better.  The patient denies any vomiting or diarrhea.  She has no chest pain, and has had no fevers or chills.  She reports that she is compliant with her medications.  Past Medical History:  Diagnosis Date  . Abdominal aortic aneurysm (AAA) 3.0 cm to 5.0 cm in diameter in female [facility]) 05/2018   being followed by dr. Delana Meyer. will reultrasound in 1 year  . Anxiety   . Asthma   . Barrett's esophagus   . COPD (chronic obstructive pulmonary disease) ([place])   . Depression   . GERD (gastroesophageal reflux disease)   . History of peptic ulcer disease   . Hypercholesterolemia   . Low oxygen saturation    2l hs  . Osteopenia   . Panic disorder   . Personal history of radiation therapy   . Personal history of tobacco use, presenting hazards to health 01/16/2015  . Requires supplemental oxygen 05/2018   supposed to use 2L NP at night but she currently does not.  encouraged patient to resume this  . Sleep apnea    snores significantly but has never been tested for OSA  . SOB (shortness of breath)   . Squamous cell carcinoma of right lung [facility]) 2015    Patient Active Problem List   Diagnosis Date Noted  . Atrial flutter with rapid ventricular response ([place]) 06/17/2019  . Left leg swelling 05/14/2018  . Elevated hemoglobin A1c 05/02/2018  . Abdominal aortic aneurysm (AAA) 3.0 cm to 5.0 cm in  diameter in female ([place]) 06/30/2017  . Abdominal aortic atherosclerosis ([place]) 06/30/2017  . Chronic venous insufficiency 04/11/2016  . Varicose veins of bilateral lower extremities with pain 03/12/2016  . Cancer of lower lobe of right lung ([place]) 10/09/2015  . Oropharyngeal candidiasis 07/12/2015  . Malignant neoplasm of hilus of left lung ([place])   . Personal history of tobacco use, presenting hazards to health 01/16/2015  . Airway hyperreactivity 11/15/2014  . CN (constipation) 11/15/2014  . COPD with acute exacerbation ([place]) 11/15/2014  . Daytime somnolence 11/15/2014  . Clinical depression 11/15/2014  . DD (diverticular disease) 11/15/2014  . Accumulation of fluid in tissues 11/15/2014  . H/O peptic ulcer 11/15/2014  . Osteopenia 11/15/2014  . Awareness of heartbeats 11/15/2014  . Episodic paroxysmal anxiety disorder 11/15/2014  . Apnea, sleep 11/15/2014  . Snores 11/15/2014  . Breath shortness 11/15/2014  . Compulsive tobacco user syndrome 11/15/2014  . Avitaminosis D 11/15/2014  . Barrett esophagus 10/19/2014  . Acid reflux 10/19/2014  . COPD, moderate ([place]) 08/31/2013  . Dyspnea 08/31/2013  . Otitis, externa, infective 08/31/2013  . Nocturnal hypoxemia 08/31/2013  . Hypercholesterolemia without hypertriglyceridemia 11/05/2005    Past Surgical History:  Procedure Laterality Date  . CERVICAL CONE BIOPSY  1990  . CHOLECYSTECTOMY  1980's  . colonoscopy  2014  . COLONOSCOPY  07/21/2012  . COLONOSCOPY WITH PROPOFOL N/A 05/12/2018   Procedure: COLONOSCOPY WITH PROPOFOL;  Surgeon: Gustavo Lah,  Billie Ruddy, MD;  Location: [facility] ENDOSCOPY;  Service: Endoscopy;  Laterality: N/A;  . ELBOW FRACTURE SURGERY Right 2009  . ENDOBRONCHIAL ULTRASOUND N/A 02/07/2015   Procedure: ENDOBRONCHIAL ULTRASOUND;  Surgeon: Flora Lipps, MD;  Location: [facility] ORS;  Service: Cardiopulmonary;  Laterality: N/A;  . ENDOBRONCHIAL ULTRASOUND Left 06/05/2018   Procedure: ENDOBRONCHIAL ULTRASOUND;  Surgeon: Flora Lipps, MD;  Location: [facility] ORS;  Service: Cardiopulmonary;  Laterality: Left;  . ESOPHAGOGASTRODUODENOSCOPY (EGD) WITH PROPOFOL N/A 02/26/2016   Procedure: ESOPHAGOGASTRODUODENOSCOPY (EGD) WITH PROPOFOL;  Surgeon: Lollie Sails, MD;  Location[facility] ENDOSCOPY;  Service: Endoscopy;  Laterality: N/A;  COPD, Sleep Apnea  . ESOPHAGOGASTRODUODENOSCOPY (EGD) WITH PROPOFOL N/A 05/12/2018   Procedure: ESOPHAGOGASTRODUODENOSCOPY (EGD) WITH PROPOFOL;  Surgeon: Lollie Sails, MD;  Location: [facility] ENDOSCOPY;  Service: Endoscopy;  Laterality: N/A;  . EYE SURGERY Bilateral    cataract extractions  . FRACTURE SURGERY  2005   elbow repair  . PORTA CATH INSERTION N/A 06/17/2018   Procedure: PORTA CATH INSERTION;  Surgeon: Katha Cabal, MD;  Location: [place] CV LAB;  Service: Cardiovascular;  Laterality: N/A;  . TONSILLECTOMY  1979  . TUBAL LIGATION  1970's  . UPPER GI ENDOSCOPY      Prior to Admission medications   Medication Sig Start Date End Date Taking? Authorizing Provider  albuterol (VENTOLIN HFA) 108 (90 Base) MCG/ACT inhaler Inhale 2 puffs into the lungs every 2 (two) hours as needed for wheezing or shortness of breath. 06/19/19   Ezekiel Slocumb, DO  apixaban (ELIQUIS) 5 MG TABS tablet Take 1 tablet (5 mg total) by mouth 2 (two) times daily. 06/19/19   Ezekiel Slocumb, DO  busPIRone (BUSPAR) 30 MG tablet Take 1 tablet by mouth twice daily 05/17/19   Mar Daring, PA-C  CALCIUM CARBONATE-VIT D-MIN PO Take 1 tablet by mouth 2 (two) times daily. Currently out of- needs to get more 01/02/11   [provider]  clotrimazole-betamethasone (LOTRISONE) cream 1 (one) Cream Cream Apply twice daily to affected area.  Not for Internal use. Patient taking differently: Apply 1 application topically daily as needed (rash). 1 (one) Cream Cream Apply twice daily to affected area.  Not for Internal use. 05/01/18   Mar Daring, PA-C  diltiazem (CARDIZEM CD) 240 MG 24 hr  capsule Take 1 capsule (240 mg total) by mouth daily. 06/20/19   Ezekiel Slocumb, DO  escitalopram (LEXAPRO) 20 MG tablet Take 1 tablet by mouth once daily 05/17/19   Mar Daring, PA-C  Fluticasone-Umeclidin-Vilant (TRELEGY ELLIPTA) 100-62.5-25 MCG/INH AEPB Inhale 1 puff into the lungs daily. 12/28/18   Flora Lipps, MD  furosemide (LASIX) 20 MG tablet Take 1 tablet (20 mg total) by mouth daily. 01/20/19   Mar Daring, PA-C  guaiFENesin-dextromethorphan (ROBITUSSIN DM) 100-10 MG/5ML syrup Take 5 mLs by mouth every 4 (four) hours as needed for cough. Patient not taking: Reported on 06/21/2019 05/27/19   Flora Lipps, MD  ipratropium (ATROVENT HFA) 17 MCG/ACT inhaler Inhale 2 puffs into the lungs 4 (four) times daily. 06/19/19 06/18/20  Ezekiel Slocumb, DO  levofloxacin (LEVAQUIN) 500 MG tablet Take 1 tablet (500 mg total) by mouth daily. 06/24/19   Mar Daring, PA-C  lidocaine-prilocaine (EMLA) cream Apply 1 application topically as needed. 06/10/18   Cammie Sickle, MD  Magnesium 250 MG TABS Take 250 mg by mouth 2 (two) times daily.     [provider]  nystatin (MYCOSTATIN) 100000 UNIT/ML suspension Take 5 mLs (500,000 Units  total) by mouth 4 (four) times daily. 06/21/19   Mar Daring, PA-C  omeprazole (PRILOSEC) 40 MG capsule Take 1 capsule by mouth once daily 04/23/19   Fenton Malling M, PA-C  polyethylene glycol powder (GLYCOLAX/MIRALAX) powder TAKE 17 GRAMS OF POWDER MIXED IN 8 OUNCES OF WATER BY MOUTH ONCE A DAY. Patient taking differently: Take 1 Container by mouth daily. TAKE 17 GRAMS OF POWDER MIXED IN 8 OUNCES OF WATER BY MOUTH ONCE A DAY. 05/02/17   Mar Daring, PA-C  potassium chloride SA (K-DUR) 20 MEQ tablet Take 1 tablet (20 mEq total) by mouth daily. 10/22/18   Mar Daring, PA-C  pravastatin (PRAVACHOL) 20 MG tablet TAKE 1 TABLET BY MOUTH AT BEDTIME 06/14/19   Mar Daring, PA-C  predniSONE (DELTASONE) 20 MG tablet  Take 3 tablets (60 mg total) by mouth daily for 3 days, THEN 2 tablets (40 mg total) daily for 3 days, THEN 1 tablet (20 mg total) daily for 3 days. Start the day after the ER visit. 06/29/19 07/08/19  Arta Silence, MD  PROAIR HFA 108 [phone] Base) MCG/ACT inhaler INHALE 2 PUFFS BY MOUTH EVERY 4 HOURS AS NEEDED FOR WHEEZING FOR SHORTNESS OF BREATH 06/15/19   Flora Lipps, MD  Respiratory Therapy Supplies (FLUTTER) DEVI 1 each by Does not apply route QID. 03/31/18   Flora Lipps, MD  rOPINIRole (REQUIP) 3 MG tablet TAKE 1 TABLET BY MOUTH AT BEDTIME 02/12/19   Mar Daring, PA-C    Allergies Amoxicillin  Family History  Problem Relation Age of Onset  . Colon cancer Mother        colon  . Diabetes Mother   . Arthritis Mother   . Glaucoma Mother   . Stomach cancer Father        stomach  . Throat cancer Brother        throat  . Breast cancer Sister        breast  . Diabetes Sister   . Cirrhosis Sister   . Cancer Paternal Aunt   . Parkinson's disease Sister   . Heart attack Sister   . Ovarian cancer Sister     Social History Social History   Tobacco Use  . Smoking status: Former Smoker    Packs/day: 1.00    Years: 55.00    Pack years: 55.00    Types: Cigarettes    Quit date: 11/02/2017    Years since quitting: 1.6  . Smokeless tobacco: Never Used  Substance Use Topics  . Alcohol use: Yes    Alcohol/week: 0.0 standard drinks    Comment: occasionally- wine  . Drug use: No    Review of Systems  Constitutional: No fever/chills.  Positive for weakness. Eyes: No redness. ENT: No sore throat. Cardiovascular: Denies chest pain. Respiratory: Positive for shortness of breath. Gastrointestinal: No vomiting or diarrhea.  Genitourinary: Negative for dysuria.  Musculoskeletal: Negative for back pain. Skin: Negative for rash. Neurological: Negative for headaches, focal weakness or numbness.   ____________________________________________   PHYSICAL EXAM:  VITAL  SIGNS: ED Triage Vitals [06/28/19 1520]  Enc Vitals Group     BP 126/74     Pulse Rate 92     Resp 20     Temp 98.7 F (37.1 C)     Temp Source Oral     SpO2 97 %     Weight 242 lb (109.8 kg)     Height 5\' 10"  (1.778 m)     Head Circumference  Peak Flow      Pain Score 0     Pain Loc      Pain Edu?      Excl. in [place]?     Constitutional: Alert and oriented.  Relatively well appearing and in no acute distress. Eyes: Conjunctivae are normal.  Head: Atraumatic. Nose: No congestion/rhinnorhea. Mouth/Throat: Mucous membranes are slightly dry.   Neck: Normal range of motion.  Cardiovascular: Normal rate, regular rhythm. Grossly normal heart sounds.  Good peripheral circulation. Respiratory: Increased respiratory effort.  No retractions.  Diffuse wheezing bilaterally with somewhat decreased air entry. Gastrointestinal: Soft and nontender. No distention.  Genitourinary: No flank tenderness. Musculoskeletal: No lower extremity edema.  Extremities warm and well perfused.  Neurologic:  Normal speech and language. No gross focal neurologic deficits are appreciated.  Skin:  Skin is warm and dry. No rash noted. Psychiatric: Mood and affect are normal. Speech and behavior are normal.  ____________________________________________   LABS (all labs ordered are listed, but only abnormal results are displayed)  Labs Reviewed  BASIC METABOLIC PANEL - Abnormal; Notable for the following components:      Result Value   Sodium 125 (*)    Chloride 89 (*)    Glucose, Bld 124 (*)    Calcium 8.3 (*)    All other components within normal limits  CBC - Abnormal; Notable for the following components:   WBC 13.8 (*)    RDW 15.6 (*)    All other components within normal limits  URINALYSIS, COMPLETE (UACMP) WITH MICROSCOPIC - Abnormal; Notable for the following components:   Color, Urine YELLOW (*)    APPearance CLEAR (*)    Hgb urine dipstick SMALL (*)    All other components within normal  limits  TROPONIN I (HIGH SENSITIVITY)  TROPONIN I (HIGH SENSITIVITY)   ____________________________________________  EKG  ED ECG REPORT I, Arta Silence, the attending physician, personally viewed and interpreted this ECG.  Date: 06/28/2019 EKG Time: 1511 Rate: 92 Rhythm: normal sinus rhythm QRS Axis: normal Intervals: normal ST/T Wave abnormalities: Early repolarization Narrative Interpretation: no evidence of acute ischemia  ____________________________________________  RADIOLOGY  CXR: Stable bilateral perihilar opacities with no acute focal infiltrate or edema  ____________________________________________   PROCEDURES  Procedure(s) performed: No  Procedures  Critical Care performed: No ____________________________________________   INITIAL IMPRESSION / ASSESSMENT AND PLAN / ED COURSE  Pertinent labs & imaging results that were available during my care of the patient were reviewed by me and considered in my medical decision making (see chart for details).  74 year old female with PMH as noted above presents with persistent generalized weakness and shortness of breath since her hospital discharge on 2/13.  I reviewed the past medical records in epic.  The patient was admitted from 2/11-2/13 with shortness of breath and chest pain and no improvement on a prednisone taper or doxycycline.  She was in atrial fibrillation with RVR and had an acute COPD exacerbation.  Subsequently, she had an elevated white count noted last week and was prescribed a course of levofloxacin.  On exam today, the patient is overall relatively comfortable appearing, with slightly increased work of breathing but no acute respiratory distress.  Her vital signs are normal.  O2 saturation is in the mid to high 90s on room air.  However, she does have significant diffuse wheezing and somewhat diminished breath sounds bilaterally.  Overall presentation is consistent with persistent COPD  exacerbation or possible bronchitis.  Initial work-up reveals that the WBC count is  coming back down, and the chest x-ray shows no new infiltrate.  The patient's sodium is also slightly low which could be contributing to generalized weakness.  There is no evidence of cardiac cause, and I do not suspect COVID-19.  We will give IV steroid, DuoNeb's, a fluid bolus, and reassess.  ----------------------------------------- 7:37 PM on 06/28/2019 -----------------------------------------  On reassessment, the patient is feeling significantly better.  She states that she feels well going home.  O2 saturation remains in the high 90s on room air, and she has better air entry and decreased wheezing on lung exam.  I will prescribe a steroid course for the next week and have instructed her to use her albuterol.  She is still finishing the Levaquin, given that the WBC count is going down and her chest x-ray shows no new infiltrate, there is no indication to change this.  Patient is stable for discharge at this time.  Return precautions given, and she expresses understanding.  ____________________________________________   FINAL CLINICAL IMPRESSION(S) / ED DIAGNOSES  Final diagnoses:  Chronic obstructive pulmonary disease with acute exacerbation (HCC)      NEW MEDICATIONS STARTED DURING THIS VISIT:  New Prescriptions   PREDNISONE (DELTASONE) 20 MG TABLET    Take 3 tablets (60 mg total) by mouth daily for 3 days, THEN 2 tablets (40 mg total) daily for 3 days, THEN 1 tablet (20 mg total) daily for 3 days. Start the day after the ER visit.     Note:  This document was prepared using Dragon voice recognition software and may include unintentional dictation errors.    Arta Silence, MD 06/28/19 [phone]

## 2019-06-28 NOTE — Telephone Encounter (Signed)
Pt called stating she is having a hard time breathing; she was hospitalized 06/17/19-06/20/19, and continues to have SOB; the pt was seen 06/21/19 she would like more prednisone; the pt says that she is having SOB at rest and profound weakness; the pt is audibly wheezing;  recommendations made per nurse triage protocol; she verbalized understanding, and declines have triage RN calling 911; she states her daughter will take her to the ED; the pt sees Fenton Malling, [facility]; will route to office for notification.  Reason for Disposition . Sounds like a life-threatening emergency to the triager  Answer Assessment - Initial Assessment Questions 1. RESPIRATORY STATUS: "Describe your breathing?" (e.g., wheezing, shortness of breath, unable to speak, severe coughing)      Shortness of breath at rest 2. ONSET: "When did this breathing problem begin?"     Ongoing since d/c'd from hospitl 06/20/19 3. PATTERN "Does the difficult breathing come and go, or has it been constant since it started?"     constant 4. SEVERITY: "How bad is your breathing?" (e.g., mild, moderate, severe)    - MILD: No SOB at rest, mild SOB with walking, speaks normally in sentences, can lay down, no retractions, pulse < 100.    - MODERATE: SOB at rest, SOB with minimal exertion and prefers to sit, cannot lie down flat, speaks in phrases, mild retractions, audible wheezing, pulse 100-120.    - SEVERE: Very SOB at rest, speaks in single words, struggling to breathe, sitting hunched forward, retractions, pulse > 120     moderate 5. RECURRENT SYMPTOM: "Have you had difficulty breathing before?" If so, ask: "When was the last time?" and "What happened that time?"      Ongoing since d/c'd from hospital 06/20/19; pt has COPD 6. CARDIAC HISTORY: "Do you have any history of heart disease?" (e.g., heart attack, angina, bypass surgery, angioplasty)    afib 7. LUNG HISTORY: "Do you have any history of lung disease?"  (e.g., pulmonary  embolus, asthma, emphysema)     COPD 8. CAUSE: "What do you think is causing the breathing problem?"      COPD 9. OTHER SYMPTOMS: "Do you have any other symptoms? (e.g., dizziness, runny nose, cough, chest pain, fever)  weakness 10. PREGNANCY: "Is there any chance you are pregnant?" "When was your last menstrual period?"     no 11. TRAVEL: "Have you traveled out of the country in the last month?" (e.g., travel history, exposures)  Protocols used: BREATHING DIFFICULTY-A-AH

## 2019-06-28 NOTE — Discharge Instructions (Addendum)
Take the prednisone as prescribed and finish the full course.  Use your albuterol every 4-6 hours for the next several days.  Return to the ER for new, worsening or persistent severe shortness of breath, generalized weakness, lightheadedness, cough, fever, or any other new or worsening symptoms that concern you.

## 2019-06-28 NOTE — ED Notes (Signed)
Sent rainbow to the lab. 

## 2019-06-28 NOTE — ED Triage Notes (Signed)
Patient presents to the ED 1 week post discharge.  Patient reports feeling short of breath and weak since discharge.  Patient states, "I just don't have any energy."  Patient denies chest pain.

## 2019-07-02 ENCOUNTER — Encounter: Payer: Self-pay | Admitting: Physician Assistant

## 2019-07-02 ENCOUNTER — Ambulatory Visit (INDEPENDENT_AMBULATORY_CARE_PROVIDER_SITE_OTHER): Payer: Medicare Other | Admitting: Physician Assistant

## 2019-07-02 ENCOUNTER — Other Ambulatory Visit: Payer: Self-pay

## 2019-07-02 VITALS — BP 119/79 | HR 69 | Temp 96.6°F | Wt 248.0 lb

## 2019-07-02 DIAGNOSIS — C3402 Malignant neoplasm of left main bronchus: Secondary | ICD-10-CM | POA: Diagnosis not present

## 2019-07-02 DIAGNOSIS — R0602 Shortness of breath: Secondary | ICD-10-CM | POA: Diagnosis not present

## 2019-07-02 DIAGNOSIS — J449 Chronic obstructive pulmonary disease, unspecified: Secondary | ICD-10-CM | POA: Diagnosis not present

## 2019-07-02 DIAGNOSIS — C3431 Malignant neoplasm of lower lobe, right bronchus or lung: Secondary | ICD-10-CM

## 2019-07-02 DIAGNOSIS — R42 Dizziness and giddiness: Secondary | ICD-10-CM

## 2019-07-02 DIAGNOSIS — R531 Weakness: Secondary | ICD-10-CM

## 2019-07-02 DIAGNOSIS — J441 Chronic obstructive pulmonary disease with (acute) exacerbation: Secondary | ICD-10-CM

## 2019-07-02 DIAGNOSIS — I4892 Unspecified atrial flutter: Secondary | ICD-10-CM

## 2019-07-02 MED ORDER — IPRATROPIUM-ALBUTEROL 0.5-2.5 (3) MG/3ML IN SOLN: 3.0000 mL | Freq: Four times a day (QID) | RESPIRATORY_TRACT | 5 refills | Status: AC | PRN

## 2019-07-02 NOTE — Progress Notes (Signed)
Patient: Cathy Watts Female    DOB: Sep 28, 1945   74 y.o.   MRN: [id number] Visit Date: 07/02/2019  Today's Provider: Mar Daring, PA-C   Chief Complaint  Patient presents with  . Follow-up    ER Follow up  . COPD   Subjective:     HPI    Follow up ER visit  Patient was seen in ER for weakness on 06/28/2019. She was treated for COPD Exacerbation. Treatment for this included Prednisone. She reports excellent compliance with treatment. She reports this condition is Unchanged.  Pt is still very weak, short of breath. Having worsening balance issues and feels dizzy. Has audible wheezing with breathing in the office despite being on oral steroid.   ------------------------------------------------------------------------------------    Allergies  Allergen Reactions  . Amoxicillin Hives and Itching    Did it involve swelling of the face/tongue/throat, SOB, or low BP? No Did it involve sudden or severe rash/hives, skin peeling, or any reaction on the inside of your mouth or nose? No Did you need to seek medical attention at a hospital or doctor's office? No When did it last happen?2018 If all above answers are "NO", may proceed with cephalosporin use.        Current Outpatient Medications:  .  albuterol (VENTOLIN HFA) 108 (90 Base) MCG/ACT inhaler, Inhale 2 puffs into the lungs every 2 (two) hours as needed for wheezing or shortness of breath., Disp: 6.7 g, Rfl: 0 .  apixaban (ELIQUIS) 5 MG TABS tablet, Take 1 tablet (5 mg total) by mouth 2 (two) times daily., Disp: 60 tablet, Rfl: 1 .  busPIRone (BUSPAR) 30 MG tablet, Take 1 tablet by mouth twice daily, Disp: 180 tablet, Rfl: 1 .  CALCIUM CARBONATE-VIT D-MIN PO, Take 1 tablet by mouth 2 (two) times daily. Currently out of- needs to get more, Disp: , Rfl:  .  clotrimazole-betamethasone (LOTRISONE) cream, 1 (one) Cream Cream Apply twice daily to affected area.  Not for Internal use. (Patient taking  differently: Apply 1 application topically daily as needed (rash). 1 (one) Cream Cream Apply twice daily to affected area.  Not for Internal use.), Disp: 45 g, Rfl: 1 .  diltiazem (CARDIZEM CD) 240 MG 24 hr capsule, Take 1 capsule (240 mg total) by mouth daily., Disp: 30 capsule, Rfl: 1 .  escitalopram (LEXAPRO) 20 MG tablet, Take 1 tablet by mouth once daily, Disp: 90 tablet, Rfl: 1 .  Fluticasone-Umeclidin-Vilant (TRELEGY ELLIPTA) 100-62.5-25 MCG/INH AEPB, Inhale 1 puff into the lungs daily., Disp: 180 each, Rfl: 3 .  furosemide (LASIX) 20 MG tablet, Take 1 tablet (20 mg total) by mouth daily., Disp: 90 tablet, Rfl: 1 .  guaiFENesin-dextromethorphan (ROBITUSSIN DM) 100-10 MG/5ML syrup, Take 5 mLs by mouth every 4 (four) hours as needed for cough. (Patient not taking: Reported on 06/21/2019), Disp: 118 mL, Rfl: 0 .  ipratropium (ATROVENT HFA) 17 MCG/ACT inhaler, Inhale 2 puffs into the lungs 4 (four) times daily., Disp: 1 Inhaler, Rfl: 2 .  levofloxacin (LEVAQUIN) 500 MG tablet, Take 1 tablet (500 mg total) by mouth daily., Disp: 10 tablet, Rfl: 0 .  lidocaine-prilocaine (EMLA) cream, Apply 1 application topically as needed., Disp: 30 g, Rfl: 0 .  Magnesium 250 MG TABS, Take 250 mg by mouth 2 (two) times daily. , Disp: , Rfl:  .  nystatin (MYCOSTATIN) 100000 UNIT/ML suspension, Take 5 mLs (500,000 Units total) by mouth 4 (four) times daily., Disp: 240 mL, Rfl: 0 .  omeprazole (PRILOSEC) 40 MG capsule, Take 1 capsule by mouth once daily, Disp: 90 capsule, Rfl: 0 .  polyethylene glycol powder (GLYCOLAX/MIRALAX) powder, TAKE 17 GRAMS OF POWDER MIXED IN 8 OUNCES OF WATER BY MOUTH ONCE A DAY. (Patient taking differently: Take 1 Container by mouth daily. TAKE 17 GRAMS OF POWDER MIXED IN 8 OUNCES OF WATER BY MOUTH ONCE A DAY.), Disp: 255 g, Rfl: 3 .  potassium chloride SA (K-DUR) 20 MEQ tablet, Take 1 tablet (20 mEq total) by mouth daily., Disp: 30 tablet, Rfl: 3 .  pravastatin (PRAVACHOL) 20 MG tablet, TAKE  1 TABLET BY MOUTH AT BEDTIME, Disp: 90 tablet, Rfl: 1 .  predniSONE (DELTASONE) 20 MG tablet, Take 3 tablets (60 mg total) by mouth daily for 3 days, THEN 2 tablets (40 mg total) daily for 3 days, THEN 1 tablet (20 mg total) daily for 3 days. Start the day after the ER visit., Disp: 18 tablet, Rfl: 0 .  PROAIR HFA 108 (90 Base) MCG/ACT inhaler, INHALE 2 PUFFS BY MOUTH EVERY 4 HOURS AS NEEDED FOR WHEEZING FOR SHORTNESS OF BREATH, Disp: 9 g, Rfl: 0 .  Respiratory Therapy Supplies (FLUTTER) DEVI, 1 each by Does not apply route QID., Disp: 1 each, Rfl: 0 .  rOPINIRole (REQUIP) 3 MG tablet, TAKE 1 TABLET BY MOUTH AT BEDTIME, Disp: 90 tablet, Rfl: 1  Review of Systems  Constitutional: Positive for fatigue. Negative for activity change, appetite change, chills, diaphoresis, fever and unexpected weight change.  Respiratory: Positive for cough (Pt states she is coughing up some blood.  ), shortness of breath and wheezing. Negative for apnea, choking, chest tightness and stridor.   Cardiovascular: Negative.   Gastrointestinal: Negative.   Musculoskeletal: Positive for gait problem.  Neurological: Positive for dizziness and weakness. Negative for light-headedness and headaches.    Social History   Tobacco Use  . Smoking status: Former Smoker    Packs/day: 1.00    Years: 55.00    Pack years: 55.00    Types: Cigarettes    Quit date: 11/02/2017    Years since quitting: 1.6  . Smokeless tobacco: Never Used  Substance Use Topics  . Alcohol use: Yes    Alcohol/week: 0.0 standard drinks    Comment: occasionally- wine      Objective:   BP 119/79 (BP Location: Right Arm, Patient Position: Sitting, Cuff Size: Large)   Pulse 69   Temp (!) 96.6 F (35.9 C) (Temporal)   Wt 248 lb (112.5 kg)   SpO2 96%   BMI 35.58 kg/m  Vitals:   07/02/19 1548  BP: 119/79  Pulse: 69  Temp: (!) 96.6 F (35.9 C)  TempSrc: Temporal  SpO2: 96%  Weight: 248 lb (112.5 kg)  Body mass index is 35.58  kg/m.   Physical Exam Vitals reviewed.  Constitutional:      General: She is not in acute distress.    Appearance: Normal appearance. She is well-developed. She is obese. She is not ill-appearing or diaphoretic.  HENT:     Head: Normocephalic and atraumatic.     Right Ear: Tympanic membrane, ear canal and external ear normal.     Left Ear: Tympanic membrane, ear canal and external ear normal.  Eyes:     General: No scleral icterus.       Right eye: No discharge.        Left eye: No discharge.     Extraocular Movements: Extraocular movements intact.     Conjunctiva/sclera: Conjunctivae normal.  Pupils: Pupils are equal, round, and reactive to light.  Neck:     Thyroid: No thyromegaly.     Vascular: No JVD.     Trachea: No tracheal deviation.  Cardiovascular:     Rate and Rhythm: Normal rate. Rhythm irregularly irregular.     Pulses: Normal pulses.     Heart sounds: Normal heart sounds. No murmur. No friction rub. No gallop.   Pulmonary:     Effort: Pulmonary effort is normal. No respiratory distress.     Breath sounds: Decreased breath sounds, wheezing, rhonchi and rales present.  Musculoskeletal:     Cervical back: Normal range of motion and neck supple.     Right lower leg: No edema.     Left lower leg: No edema.  Lymphadenopathy:     Cervical: No cervical adenopathy.  Neurological:     Mental Status: She is alert and oriented to person, place, and time.     Cranial Nerves: No cranial nerve deficit.     Motor: No weakness.     Coordination: Coordination normal.     Gait: Gait abnormal (slightly off balance with initial standing and walking).  Psychiatric:        Mood and Affect: Mood normal.        Behavior: Behavior normal.        Thought Content: Thought content normal.        Judgment: Judgment normal.      No results found for any visits on 07/02/19.     Assessment & Plan    1. COPD, moderate ([place]) Patient has nebulizer machine at home but no  medication. Will send in Duoneb as I feel she lacks the ability to pull medication with inhalers into her lungs as she used to. Unfortunately unable to due spirometry to confirm this due to covid 19 pandemic protocols. Also discussed with her oncologist, Dr. Rogue Bussing, about her worsening symptoms. He agrees that we should proceed with obtaining a CT chest and MRI brain as ordered below to evaluate for possible progression of lung cancer. We will f/u pending results and Dr. Rogue Bussing is going to see patient sooner than her April f/u. It is possible she has a worsening of symptoms due to chronically being in an atrial flutter heart rhythm. She is following up with Cardiology on 3/10. Today she does sound as if she is still in an irregular rhythm but it is rate controlled. She is to continue all medications as prescribed unless otherwise advised at her appointments. Call if worsening. Strict ER return precautions verbally discussed.  - ipratropium-albuterol (DUONEB) 0.5-2.5 (3) MG/3ML SOLN; Take 3 mLs by nebulization every 6 (six) hours as needed.  Dispense: 360 mL; Refill: 5 - CT Chest W Contrast; Future - MR Brain W Wo Contrast; Future  2. COPD with acute exacerbation ([place]) See above medical treatment plan. - ipratropium-albuterol (DUONEB) 0.5-2.5 (3) MG/3ML SOLN; Take 3 mLs by nebulization every 6 (six) hours as needed.  Dispense: 360 mL; Refill: 5 - CT Chest W Contrast; Future - MR Brain W Wo Contrast; Future  3. Malignant neoplasm of hilus of left lung ([place]) See above medical treatment plan. - ipratropium-albuterol (DUONEB) 0.5-2.5 (3) MG/3ML SOLN; Take 3 mLs by nebulization every 6 (six) hours as needed.  Dispense: 360 mL; Refill: 5 - CT Chest W Contrast; Future - MR Brain W Wo Contrast; Future  4. Cancer of lower lobe of right lung [facility]) See above medical treatment plan. - CT Chest W Contrast;  Future - MR Brain W Wo Contrast; Future  5. Shortness of breath See above medical treatment  plan. - CT Chest W Contrast; Future - MR Brain W Wo Contrast; Future  6. Weakness See above medical treatment plan. - CT Chest W Contrast; Future - MR Brain W Wo Contrast; Future  7. Dizziness See above medical treatment plan. - CT Chest W Contrast; Future - MR Brain W Wo Contrast; Future  8. Atrial flutter with rapid ventricular response ([place]) See above medical treatment plan.     Mar Daring, PA-C  [facility]

## 2019-07-05 ENCOUNTER — Telehealth: Payer: Self-pay

## 2019-07-05 NOTE — Telephone Encounter (Signed)
Copied from [place] [id number]. Topic: General - Other >> Jul 05, 2019  9:33 AM Mcneil, Ja-Kwan wrote: Reason for CRM: Pt stated she just had a call from the office but she is not sure who it was that was calling. Pt requests call back

## 2019-07-06 ENCOUNTER — Ambulatory Visit: Payer: Medicare Other | Admitting: Adult Health

## 2019-07-06 ENCOUNTER — Other Ambulatory Visit: Payer: Self-pay

## 2019-07-06 ENCOUNTER — Other Ambulatory Visit
Admission: RE | Admit: 2019-07-06 | Discharge: 2019-07-06 | Disposition: A | Discharge status: Home / Self Care | Payer: Medicare Other | Source: Ambulatory Visit | Attending: Adult Health | Admitting: Adult Health

## 2019-07-06 ENCOUNTER — Encounter: Payer: Self-pay | Admitting: Adult Health

## 2019-07-06 ENCOUNTER — Telehealth: Payer: Self-pay | Admitting: Internal Medicine

## 2019-07-06 ENCOUNTER — Telehealth: Payer: Self-pay

## 2019-07-06 VITALS — BP 112/64 | HR 93 | Temp 98.9°F | Ht 70.0 in | Wt 248.0 lb

## 2019-07-06 DIAGNOSIS — Z66 Do not resuscitate: Secondary | ICD-10-CM | POA: Diagnosis not present

## 2019-07-06 DIAGNOSIS — J441 Chronic obstructive pulmonary disease with (acute) exacerbation: Secondary | ICD-10-CM | POA: Diagnosis not present

## 2019-07-06 DIAGNOSIS — R400 Somnolence: Secondary | ICD-10-CM

## 2019-07-06 DIAGNOSIS — Z7951 Long term (current) use of inhaled steroids: Secondary | ICD-10-CM | POA: Diagnosis not present

## 2019-07-06 DIAGNOSIS — Z85118 Personal history of other malignant neoplasm of bronchus and lung: Secondary | ICD-10-CM | POA: Diagnosis not present

## 2019-07-06 DIAGNOSIS — I4892 Unspecified atrial flutter: Secondary | ICD-10-CM | POA: Diagnosis not present

## 2019-07-06 DIAGNOSIS — C3402 Malignant neoplasm of left main bronchus: Secondary | ICD-10-CM

## 2019-07-06 DIAGNOSIS — R05 Cough: Secondary | ICD-10-CM

## 2019-07-06 DIAGNOSIS — E871 Hypo-osmolality and hyponatremia: Secondary | ICD-10-CM | POA: Diagnosis not present

## 2019-07-06 DIAGNOSIS — R059 Cough, unspecified: Secondary | ICD-10-CM

## 2019-07-06 DIAGNOSIS — R0602 Shortness of breath: Secondary | ICD-10-CM | POA: Diagnosis not present

## 2019-07-06 DIAGNOSIS — I872 Venous insufficiency (chronic) (peripheral): Secondary | ICD-10-CM

## 2019-07-06 DIAGNOSIS — M8580 Other specified disorders of bone density and structure, unspecified site: Secondary | ICD-10-CM | POA: Diagnosis not present

## 2019-07-06 DIAGNOSIS — G4710 Hypersomnia, unspecified: Secondary | ICD-10-CM | POA: Diagnosis not present

## 2019-07-06 DIAGNOSIS — Z803 Family history of malignant neoplasm of breast: Secondary | ICD-10-CM | POA: Diagnosis not present

## 2019-07-06 DIAGNOSIS — I509 Heart failure, unspecified: Secondary | ICD-10-CM

## 2019-07-06 DIAGNOSIS — I714 Abdominal aortic aneurysm, without rupture: Secondary | ICD-10-CM | POA: Diagnosis not present

## 2019-07-06 DIAGNOSIS — J9611 Chronic respiratory failure with hypoxia: Secondary | ICD-10-CM | POA: Diagnosis not present

## 2019-07-06 DIAGNOSIS — Z800 Family history of malignant neoplasm of digestive organs: Secondary | ICD-10-CM | POA: Diagnosis not present

## 2019-07-06 DIAGNOSIS — Z8261 Family history of arthritis: Secondary | ICD-10-CM | POA: Diagnosis not present

## 2019-07-06 DIAGNOSIS — E7800 Pure hypercholesterolemia, unspecified: Secondary | ICD-10-CM | POA: Diagnosis not present

## 2019-07-06 DIAGNOSIS — K219 Gastro-esophageal reflux disease without esophagitis: Secondary | ICD-10-CM | POA: Diagnosis not present

## 2019-07-06 DIAGNOSIS — I700 Atherosclerosis of aorta: Secondary | ICD-10-CM | POA: Diagnosis not present

## 2019-07-06 DIAGNOSIS — G4733 Obstructive sleep apnea (adult) (pediatric): Secondary | ICD-10-CM

## 2019-07-06 DIAGNOSIS — Z833 Family history of diabetes mellitus: Secondary | ICD-10-CM | POA: Diagnosis not present

## 2019-07-06 DIAGNOSIS — Z87891 Personal history of nicotine dependence: Secondary | ICD-10-CM | POA: Diagnosis not present

## 2019-07-06 DIAGNOSIS — Z7901 Long term (current) use of anticoagulants: Secondary | ICD-10-CM | POA: Diagnosis not present

## 2019-07-06 DIAGNOSIS — Z83511 Family history of glaucoma: Secondary | ICD-10-CM | POA: Diagnosis not present

## 2019-07-06 DIAGNOSIS — J189 Pneumonia, unspecified organism: Secondary | ICD-10-CM | POA: Diagnosis not present

## 2019-07-06 DIAGNOSIS — Z8249 Family history of ischemic heart disease and other diseases of the circulatory system: Secondary | ICD-10-CM | POA: Diagnosis not present

## 2019-07-06 DIAGNOSIS — Z923 Personal history of irradiation: Secondary | ICD-10-CM | POA: Diagnosis not present

## 2019-07-06 DIAGNOSIS — J449 Chronic obstructive pulmonary disease, unspecified: Secondary | ICD-10-CM | POA: Diagnosis not present

## 2019-07-06 LAB — BASIC METABOLIC PANEL
Anion gap: 10 (ref 5–15)
BUN: 16 mg/dL (ref 8–23)
CO2: 27 mmol/L (ref 22–32)
Calcium: 9 mg/dL (ref 8.9–10.3)
Chloride: 92 mmol/L — ABNORMAL LOW (ref 98–111)
Creatinine, Ser: 0.76 mg/dL (ref 0.44–1.00)
GFR calc Af Amer: >60 mL/min (ref >60)
GFR calc non Af Amer: >60 mL/min (ref >60)
Glucose, Bld: 106 mg/dL — ABNORMAL HIGH (ref 70–99)
Potassium: 4.3 mmol/L (ref 3.5–5.1)
Sodium: 129 mmol/L — ABNORMAL LOW (ref 135–145)

## 2019-07-06 NOTE — Telephone Encounter (Signed)
Copied from [place] [id number]. Topic: General - Call Back - No Documentation >> Jul 06, 2019  3:35 PM Erick Blinks wrote: [phone] Pt wants call back from office regarding questions about her referral

## 2019-07-06 NOTE — Patient Instructions (Addendum)
Finish Levaquin  Check Sputum culture .  Go for CT chest as planned.  Continue on TRELEGY 1 puff daily  Duoneb As needed  Wheezing /shortness of breath .  Add Flutter valve Three times a day  .  Extra Lasix 20mg  daily for next 2 days  Check labs today .  Set up for Sleep study .  Follow up with Oncology and Cardiology as planned .  Follow up for Echo tomorrow.  Follow up with Dr. Mortimer Fries in 6 weeks and As needed   Please contact office for sooner follow up if symptoms do not improve or worsen or seek emergency care

## 2019-07-06 NOTE — Telephone Encounter (Signed)
On 3/1-spoke to patient regarding the recent concerns relayed by PCP-worsening shortness of breath/gait ataxia.  Awaiting imaging as per PCP.  I discussed with patient's primary care physician Ms.Tollie Pizza, PA-C.  GB

## 2019-07-06 NOTE — Progress Notes (Signed)
@Patient  ID: Cathy Watts, female    DOB: [birth year], 74 y.o.   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 TxN2-Stage III; s/p carbo-Etop q 3 w- with concurrent RT- finished may 2020. December 7th2020-left hilar consolidation changes noted likely from radiation;also right hilar consolidation noted-thought to be radiation changes. Subcentimeter lung nodules which are again thought to be inflammatory/radiation.  07/06/19 Follow up : COPD , O2 RF , Lung Cancer  Presents for a 24-month follow-up.  Says over the last 3 months her breathing has not been doing as well.  She is having increased symptoms with shortness of breath and decreased activity tolerance..  Treated for COPD exacerbation.  She says she has a couple days left of Levaquin.  She continues to have productive cough at times but hard to get up mucus.  Very thick.  She denies any hemoptysis chest pain orthopnea.  SHe follows with cardiology, having difficulty with a flutter RVR and fluid retention.   2D echo is pending.  Says that she is had more ankle swelling over the last few days.  Patient has been thought that she might have underlying sleep apnea.  She does snore and have daytime sleepiness.  She does nap some.  She is up 20 pounds on her weight.     Allergies  Allergen Reactions  . Amoxicillin Hives and Itching    Did it involve swelling of the face/tongue/throat, SOB, or low BP? No Did it involve sudden or severe rash/hives, skin peeling, or any reaction on the inside of your mouth or nose? No Did you need to seek medical attention at a hospital or doctor's office? No When did it last happen?2018 If all above answers are "NO", may proceed with  cephalosporin use.       Immunization History  Administered Date(s) Administered  . Fluad Quad(high Dose 65+) 04/23/2019  . Influenza Split 04/11/2011, 04/15/2012, 04/02/2013  . Influenza, High Dose Seasonal PF 04/24/2016, 04/24/2017  . Influenza, Seasonal, Injecte, Preservative Fre 04/25/2015  . Influenza,inj,Quad PF,6+ Mos 04/20/2013  . Influenza-Unspecified 02/08/2014, 02/18/2018  . PFIZER SARS-COV-2 Vaccination 06/08/2019  . Pneumococcal Conjugate-13 04/22/2014  . Pneumococcal Polysaccharide-23 04/20/2013, 04/23/2019  . Pneumococcal-Unspecified 04/02/2013, 04/10/2014  . Tdap 11/24/2009  . Zoster 07/19/2013    Past Medical History:  Diagnosis Date  . Abdominal aortic aneurysm (AAA) 3.0 cm to 5.0 cm in diameter in female [facility]) 05/2018   being followed by dr. Delana Meyer. will reultrasound in 1 year  . Anxiety   . Asthma   . Barrett's esophagus   . COPD (chronic obstructive pulmonary disease) ([place])   . Depression   . GERD (gastroesophageal reflux disease)   . History of peptic ulcer disease   . Hypercholesterolemia   . Low oxygen saturation    2l hs  . Osteopenia   . Panic disorder   . Personal history of radiation therapy   . Personal history of tobacco use, presenting hazards to health 01/16/2015  . Requires supplemental oxygen 05/2018   supposed to use 2L NP at night but she currently does not.  encouraged patient to resume this  . Sleep apnea    snores significantly but has never been tested for OSA  . SOB (shortness of breath)   . 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 Packs/day: 1.00  . Years: 55.00  . Pack years: 55.00  . Types: Cigarettes  . Quit date: 11/02/2017  . Years since quitting: 1.6  Smokeless Tobacco Never Used   Counseling given: Not Answered   Outpatient Medications Prior to Visit  Medication Sig Dispense Refill  . albuterol (VENTOLIN HFA) 108  (90 Base) MCG/ACT inhaler Inhale 2 puffs into the lungs every 2 (two) hours as needed for wheezing or shortness of breath. 6.7 g 0  . apixaban (ELIQUIS) 5 MG TABS tablet Take 1 tablet (5 mg total) by mouth 2 (two) times daily. 60 tablet 1  . busPIRone (BUSPAR) 30 MG tablet Take 1 tablet by mouth twice daily 180 tablet 1  . diltiazem (CARDIZEM CD) 240 MG 24 hr capsule Take 1 capsule (240 mg total) by mouth daily. 30 capsule 1  . escitalopram (LEXAPRO) 20 MG tablet Take 1 tablet by mouth once daily 90 tablet 1  . Fluticasone-Umeclidin-Vilant (TRELEGY ELLIPTA) 100-62.5-25 MCG/INH AEPB Inhale 1 puff into the lungs daily. 180 each 3  . furosemide (LASIX) 20 MG tablet Take 1 tablet (20 mg total) by mouth daily. 90 tablet 1  . guaiFENesin-dextromethorphan (ROBITUSSIN DM) 100-10 MG/5ML syrup Take 5 mLs by mouth every 4 (four) hours as needed for cough. 118 mL 0  . ipratropium (ATROVENT HFA) 17 MCG/ACT inhaler Inhale 2 puffs into the lungs 4 (four) times daily. 1 Inhaler 2  . ipratropium-albuterol (DUONEB) 0.5-2.5 (3) MG/3ML SOLN Take 3 mLs by nebulization every 6 (six) hours as needed. 360 mL 5  . levofloxacin (LEVAQUIN) 500 MG tablet Take 1 tablet (500 mg total) by mouth daily. 10 tablet 0  . Magnesium 250 MG TABS Take 250 mg by mouth 2 (two) times daily.     Marland Kitchen nystatin (MYCOSTATIN) 100000 UNIT/ML suspension Take 5 mLs (500,000 Units total) by mouth 4 (four) times daily. 240 mL 0  . omeprazole (PRILOSEC) 40 MG capsule Take 1 capsule by mouth once daily 90 capsule 0  . polyethylene glycol powder (GLYCOLAX/MIRALAX) powder TAKE 17 GRAMS OF POWDER MIXED IN 8 OUNCES OF WATER BY MOUTH ONCE A DAY. (Patient taking differently: Take 1 Container by mouth daily. TAKE 17 GRAMS OF POWDER MIXED IN 8 OUNCES OF WATER BY MOUTH ONCE A DAY.) 255 g 3  . potassium chloride SA (K-DUR) 20 MEQ tablet Take 1 tablet (20 mEq total) by mouth daily. 30 tablet 3  . pravastatin (PRAVACHOL) 20 MG tablet TAKE 1 TABLET BY MOUTH AT BEDTIME  90 tablet 1  . predniSONE (DELTASONE) 20 MG tablet Take 3 tablets (60 mg total) by mouth daily for 3 days, THEN 2 tablets (40 mg total) daily for 3 days, THEN 1 tablet (20 mg total) daily for 3 days. Start the day after the ER visit. 18 tablet 0  . PROAIR HFA 108 (90 Base) MCG/ACT inhaler INHALE 2 PUFFS BY MOUTH EVERY 4 HOURS AS NEEDED FOR WHEEZING FOR SHORTNESS OF BREATH 9 g 0  . Respiratory Therapy Supplies (FLUTTER) DEVI 1 each by Does not apply route QID. 1 each 0  . rOPINIRole (REQUIP) 3 MG tablet TAKE 1 TABLET BY MOUTH AT BEDTIME 90 tablet 1  . CALCIUM CARBONATE-VIT D-MIN PO Take 1 tablet by mouth 2 (two) times daily. Currently out of- needs to get more    . clotrimazole-betamethasone (LOTRISONE) cream 1 (one) Cream Cream Apply twice  daily to affected area.  Not for Internal use. (Patient taking differently: Apply 1 application topically daily as needed (rash). 1 (one) Cream Cream Apply twice daily to affected area.  Not for Internal use.) 45 g 1  . lidocaine-prilocaine (EMLA) cream Apply 1 application topically as needed. 30 g 0   No facility-administered medications prior to visit.     Review of Systems:   Constitutional:   No  weight loss, night sweats,  Fevers, chills,  +fatigue, or  lassitude.  HEENT:   No headaches,  Difficulty swallowing,  Tooth/dental problems, or  Sore throat,                No sneezing, itching, ear ache, nasal congestion, post nasal drip,   CV:  No chest pain,  Orthopnea, PND, +swelling in lower extremities, no anasarca, dizziness, palpitations, syncope.   GI  No heartburn, indigestion, abdominal pain, nausea, vomiting, diarrhea, change in bowel habits, loss of appetite, bloody stools.   Resp: No shortness of breath with exertion or at rest.  No excess mucus, no productive cough,  No non-productive cough,  No coughing up of blood.  No change in color of mucus.  No wheezing.  No chest wall deformity  Skin: no rash or lesions.  GU: no dysuria, change in  color of urine, no urgency or frequency.  No flank pain, no hematuria   MS:  No joint pain or swelling.  No decreased range of motion.  No back pain.    Physical Exam  BP 112/64 (BP Location: Left Arm, Patient Position: Sitting, Cuff Size: Large)   Pulse 93   Temp 98.9 F (37.2 C) (Temporal)   Ht 5\' 10"  (1.778 m)   Wt 248 lb (112.5 kg)   SpO2 100% Comment: on ra  BMI 35.58 kg/m   GEN: A/Ox3; pleasant , NAD, BMI 35   HEENT:  [place]/AT,  EACs-clear, TMs-wnl, NOSE-clear, THROAT-clear, no lesions, no postnasal drip or exudate noted.   NECK:  Supple w/ fair ROM; no JVD; normal carotid impulses w/o bruits; no thyromegaly or nodules palpated; no lymphadenopathy.    RESP decreased breath sounds in the bases  . no accessory muscle use, no dullness to percussion  CARD:  RRR, no m/r/g, 1-2+ peripheral edema, pulses intact, no cyanosis or clubbing.  GI:   Soft & nt; nml bowel sounds; no organomegaly or masses detected.   Musco: Warm bil, no deformities or joint swelling noted.   Neuro: alert, no focal deficits noted.    Skin: Warm, no lesions or rashes    Lab Results:  CBC    Component Value Date/Time   WBC 13.8 (H) 06/28/2019 1515   RBC 4.18 06/28/2019 1515   HGB 12.3 06/28/2019 1515   HGB 12.8 06/23/2019 1601   HCT 36.7 06/28/2019 1515   HCT 38.6 06/23/2019 1601   PLT 324 06/28/2019 1515   PLT 537 (H) 06/23/2019 1601   MCV 87.8 06/28/2019 1515   MCV 87 06/23/2019 1601   MCH 29.4 06/28/2019 1515   MCHC 33.5 06/28/2019 1515   RDW 15.6 (H) 06/28/2019 1515   RDW 15.1 06/23/2019 1601   LYMPHSABS 1.1 06/23/2019 1601   MONOABS 1.1 (H) 04/13/2019 0939   EOSABS 0.0 06/23/2019 1601   BASOSABS 0.0 06/23/2019 1601    BMET    Component Value Date/Time   NA 125 (L) 06/28/2019 1515   NA 133 (L) 06/23/2019 1601   K 4.3 06/28/2019 1515   CL 89 (L) 06/28/2019 1515  CO2 27 06/28/2019 1515   GLUCOSE 124 (H) 06/28/2019 1515   BUN 19 06/28/2019 1515   BUN 22 06/23/2019 1601    CREATININE 0.81 06/28/2019 1515   CALCIUM 8.3 (L) 06/28/2019 1515   GFRNONAA >60 06/28/2019 1515   GFRAA >60 06/28/2019 1515    BNP No results found for: BNP  ProBNP No results found for: PROBNP  Imaging: DG Chest 2 View  Result Date: 06/28/2019 CLINICAL DATA:  Chest pain, lung cancer EXAM: CHEST - 2 VIEW COMPARISON:  06/17/2019 FINDINGS: Right chest wall port catheter is unchanged. Bilateral perihilar opacities are unchanged. No pleural effusion or pneumothorax. Heart size is stable. Cholecystectomy clips. IMPRESSION: No acute finding. Stable appearance of bilateral perihilar opacities favored to reflect sequelae of radiation. Electronically Signed   By: Macy Mis M.D.   On: 06/28/2019 15:59   DG Chest 2 View  Result Date: 06/17/2019 CLINICAL DATA:  74 year old female with shortness of breath and cough for 2 weeks. History of bilateral lung cancer, more recently on the left side last year. EXAM: CHEST - 2 VIEW COMPARISON:  Chest CT 04/12/2019 and earlier. Most recent chest radiographs 04/25/2018. FINDINGS: Both hilar contours are abnormal. That on the right appears stable since 2019. That on the left appears grossly stable from the December CT when left perihilar radiation changes were suspected. Lower lung volumes. No cardiomegaly. Right chest porta cath in place. No pneumothorax, pulmonary edema or pleural effusion. Negative visible bowel gas pattern. Stable cholecystectomy clips. No acute osseous abnormality identified. IMPRESSION: 1. Lower lung volumes, but otherwise stable chest when compared to the December CT. Perihilar post radiation changes suspected. 2. No new cardiopulmonary abnormality identified. Electronically Signed   By: Genevie Ann M.D.   On: 06/17/2019 16:44    heparin lock flush 100 unit/mL    Date Action Dose Route User   Discharged on 06/28/2019   Admitted on 06/28/2019   Discharged on 06/19/2019   Admitted on 06/17/2019   06/15/2019 1427 Given 500 Units Intravenous  Mallie Snooks I, RN    sodium chloride flush (NS) 0.9 % injection 10 mL    Date Action Dose Route User   Discharged on 06/28/2019   Admitted on 06/28/2019   Discharged on 06/19/2019   Admitted on 06/17/2019   06/15/2019 1427 Given 10 mL Intravenous Mallie Snooks I, RN      No flowsheet data found.  No results found for: NITRICOXIDE      Assessment & Plan:   No problem-specific Assessment & Plan notes found for this encounter.     Rexene Edison, NP 07/06/2019

## 2019-07-07 ENCOUNTER — Telehealth: Payer: Self-pay

## 2019-07-07 DIAGNOSIS — I4892 Unspecified atrial flutter: Secondary | ICD-10-CM | POA: Diagnosis not present

## 2019-07-07 LAB — EXPECTORATED SPUTUM ASSESSMENT W GRAM STAIN, RFLX TO RESP C

## 2019-07-07 NOTE — Telephone Encounter (Signed)
Cathy Watts, I don't think you ordered this.  I believe it was the NP from the Pulmonology department of [facility], Cathy Nettle, NP.  Not knowing how urgently it needed to be recollected I spoke with someone from the microbiology department of the hospital lab to let them know, so they could contact the specialist and see about getting another specimen.   Thanks   Copied from [place] [id number]. Topic: General - Other >> Jul 07, 2019  8:27 AM Cathy Watts wrote: Reason for CRM: Cathy Watts calling from [facility] called and stated that they received a sample of sputum and had to many epithelial cells. She will have to give a new sample. Please advise

## 2019-07-08 NOTE — Assessment & Plan Note (Signed)
Slow to resolve.  Add flutter valve.  Finish antibiotics.  Check sputum culture. Gentle extra diuresis  Plan  Patient Instructions  Finish Levaquin  Check Sputum culture .  Go for CT chest as planned.  Continue on TRELEGY 1 puff daily  Duoneb As needed  Wheezing /shortness of breath .  Add Flutter valve Three times a day  .  Extra Lasix 20mg  daily for next 2 days  Check labs today .  Set up for Sleep study .  Follow up with Oncology and Cardiology as planned .  Follow up for Echo tomorrow.  Follow up with Dr. Mortimer Fries in 6 weeks and As needed   Please contact office for sooner follow up if symptoms do not improve or worsen or seek emergency care

## 2019-07-08 NOTE — Telephone Encounter (Signed)
LMTCB

## 2019-07-08 NOTE — Assessment & Plan Note (Signed)
Follow up with oncology

## 2019-07-08 NOTE — Assessment & Plan Note (Signed)
Appears to have some decompensated diastolic dysfunction-gentle diuresis.  Check labs as sodium level was low 1 week ago.  Plan  Patient Instructions  Finish Levaquin  Check Sputum culture .  Go for CT chest as planned.  Continue on TRELEGY 1 puff daily  Duoneb As needed  Wheezing /shortness of breath .  Add Flutter valve Three times a day  .  Extra Lasix 20mg  daily for next 2 days  Check labs today .  Set up for Sleep study .  Follow up with Oncology and Cardiology as planned .  Follow up for Echo tomorrow.  Follow up with Dr. Mortimer Fries in 6 weeks and As needed   Please contact office for sooner follow up if symptoms do not improve or worsen or seek emergency care

## 2019-07-08 NOTE — Assessment & Plan Note (Signed)
Has daytime sleepiness snoring and restless sleep suspicious for underlying sleep apnea.  Set up for in lab study.  Plan  Patient Instructions  Finish Levaquin  Check Sputum culture .  Go for CT chest as planned.  Continue on TRELEGY 1 puff daily  Duoneb As needed  Wheezing /shortness of breath .  Add Flutter valve Three times a day  .  Extra Lasix 20mg  daily for next 2 days  Check labs today .  Set up for Sleep study .  Follow up with Oncology and Cardiology as planned .  Follow up for Echo tomorrow.  Follow up with Dr. Mortimer Fries in 6 weeks and As needed   Please contact office for sooner follow up if symptoms do not improve or worsen or seek emergency care      '

## 2019-07-09 ENCOUNTER — Inpatient Hospital Stay
Admission: EM | Admit: 2019-07-09 | Discharge: 2019-07-15 | DRG: 194 | Disposition: A | Discharge status: Home Health | Payer: Medicare Other | Attending: Internal Medicine | Admitting: Internal Medicine

## 2019-07-09 ENCOUNTER — Other Ambulatory Visit: Payer: Self-pay

## 2019-07-09 ENCOUNTER — Ambulatory Visit (INDEPENDENT_AMBULATORY_CARE_PROVIDER_SITE_OTHER): Payer: Medicare Other | Admitting: Physician Assistant

## 2019-07-09 ENCOUNTER — Emergency Department: Payer: Medicare Other

## 2019-07-09 ENCOUNTER — Encounter: Payer: Self-pay | Admitting: Physician Assistant

## 2019-07-09 VITALS — HR 126

## 2019-07-09 DIAGNOSIS — I714 Abdominal aortic aneurysm, without rupture: Secondary | ICD-10-CM | POA: Diagnosis present

## 2019-07-09 DIAGNOSIS — E871 Hypo-osmolality and hyponatremia: Secondary | ICD-10-CM | POA: Diagnosis present

## 2019-07-09 DIAGNOSIS — I4892 Unspecified atrial flutter: Secondary | ICD-10-CM | POA: Diagnosis present

## 2019-07-09 DIAGNOSIS — Z83511 Family history of glaucoma: Secondary | ICD-10-CM

## 2019-07-09 DIAGNOSIS — E669 Obesity, unspecified: Secondary | ICD-10-CM | POA: Diagnosis present

## 2019-07-09 DIAGNOSIS — Z20822 Contact with and (suspected) exposure to covid-19: Secondary | ICD-10-CM | POA: Diagnosis present

## 2019-07-09 DIAGNOSIS — R0603 Acute respiratory distress: Secondary | ICD-10-CM

## 2019-07-09 DIAGNOSIS — Z7951 Long term (current) use of inhaled steroids: Secondary | ICD-10-CM | POA: Diagnosis not present

## 2019-07-09 DIAGNOSIS — Z820 Family history of epilepsy and other diseases of the nervous system: Secondary | ICD-10-CM | POA: Diagnosis not present

## 2019-07-09 DIAGNOSIS — Z803 Family history of malignant neoplasm of breast: Secondary | ICD-10-CM | POA: Diagnosis not present

## 2019-07-09 DIAGNOSIS — G4730 Sleep apnea, unspecified: Secondary | ICD-10-CM | POA: Diagnosis present

## 2019-07-09 DIAGNOSIS — Z85118 Personal history of other malignant neoplasm of bronchus and lung: Secondary | ICD-10-CM

## 2019-07-09 DIAGNOSIS — J9809 Other diseases of bronchus, not elsewhere classified: Secondary | ICD-10-CM | POA: Diagnosis not present

## 2019-07-09 DIAGNOSIS — R0600 Dyspnea, unspecified: Secondary | ICD-10-CM | POA: Diagnosis present

## 2019-07-09 DIAGNOSIS — J189 Pneumonia, unspecified organism: Secondary | ICD-10-CM | POA: Diagnosis present

## 2019-07-09 DIAGNOSIS — R0602 Shortness of breath: Secondary | ICD-10-CM

## 2019-07-09 DIAGNOSIS — Z66 Do not resuscitate: Secondary | ICD-10-CM | POA: Diagnosis present

## 2019-07-09 DIAGNOSIS — F410 Panic disorder [episodic paroxysmal anxiety] without agoraphobia: Secondary | ICD-10-CM | POA: Diagnosis present

## 2019-07-09 DIAGNOSIS — Z880 Allergy status to penicillin: Secondary | ICD-10-CM

## 2019-07-09 DIAGNOSIS — Z833 Family history of diabetes mellitus: Secondary | ICD-10-CM

## 2019-07-09 DIAGNOSIS — Z8249 Family history of ischemic heart disease and other diseases of the circulatory system: Secondary | ICD-10-CM

## 2019-07-09 DIAGNOSIS — J440 Chronic obstructive pulmonary disease with acute lower respiratory infection: Secondary | ICD-10-CM | POA: Diagnosis not present

## 2019-07-09 DIAGNOSIS — Z87891 Personal history of nicotine dependence: Secondary | ICD-10-CM | POA: Diagnosis not present

## 2019-07-09 DIAGNOSIS — Z7901 Long term (current) use of anticoagulants: Secondary | ICD-10-CM | POA: Diagnosis not present

## 2019-07-09 DIAGNOSIS — E7800 Pure hypercholesterolemia, unspecified: Secondary | ICD-10-CM | POA: Diagnosis present

## 2019-07-09 DIAGNOSIS — C7A1 Malignant poorly differentiated neuroendocrine tumors: Secondary | ICD-10-CM | POA: Diagnosis not present

## 2019-07-09 DIAGNOSIS — K219 Gastro-esophageal reflux disease without esophagitis: Secondary | ICD-10-CM | POA: Diagnosis present

## 2019-07-09 DIAGNOSIS — J439 Emphysema, unspecified: Secondary | ICD-10-CM | POA: Diagnosis present

## 2019-07-09 DIAGNOSIS — D638 Anemia in other chronic diseases classified elsewhere: Secondary | ICD-10-CM | POA: Diagnosis present

## 2019-07-09 DIAGNOSIS — Z923 Personal history of irradiation: Secondary | ICD-10-CM | POA: Diagnosis not present

## 2019-07-09 DIAGNOSIS — C3431 Malignant neoplasm of lower lobe, right bronchus or lung: Secondary | ICD-10-CM | POA: Diagnosis present

## 2019-07-09 DIAGNOSIS — C3491 Malignant neoplasm of unspecified part of right bronchus or lung: Secondary | ICD-10-CM | POA: Diagnosis not present

## 2019-07-09 DIAGNOSIS — Z79899 Other long term (current) drug therapy: Secondary | ICD-10-CM

## 2019-07-09 DIAGNOSIS — Z6835 Body mass index (BMI) 35.0-35.9, adult: Secondary | ICD-10-CM

## 2019-07-09 DIAGNOSIS — I4891 Unspecified atrial fibrillation: Secondary | ICD-10-CM | POA: Diagnosis present

## 2019-07-09 DIAGNOSIS — Z8261 Family history of arthritis: Secondary | ICD-10-CM | POA: Diagnosis not present

## 2019-07-09 DIAGNOSIS — I700 Atherosclerosis of aorta: Secondary | ICD-10-CM | POA: Diagnosis present

## 2019-07-09 DIAGNOSIS — Z800 Family history of malignant neoplasm of digestive organs: Secondary | ICD-10-CM

## 2019-07-09 DIAGNOSIS — M8580 Other specified disorders of bone density and structure, unspecified site: Secondary | ICD-10-CM | POA: Diagnosis present

## 2019-07-09 DIAGNOSIS — G4733 Obstructive sleep apnea (adult) (pediatric): Secondary | ICD-10-CM | POA: Diagnosis present

## 2019-07-09 DIAGNOSIS — Z8041 Family history of malignant neoplasm of ovary: Secondary | ICD-10-CM | POA: Diagnosis not present

## 2019-07-09 DIAGNOSIS — J9611 Chronic respiratory failure with hypoxia: Secondary | ICD-10-CM | POA: Diagnosis present

## 2019-07-09 DIAGNOSIS — G2581 Restless legs syndrome: Secondary | ICD-10-CM | POA: Diagnosis present

## 2019-07-09 LAB — CBC
HCT: 35.7 % — ABNORMAL LOW (ref 36.0–46.0)
Hemoglobin: 11.3 g/dL — ABNORMAL LOW (ref 12.0–15.0)
MCH: 28.5 pg (ref 26.0–34.0)
MCHC: 31.7 g/dL (ref 30.0–36.0)
MCV: 90.2 fL (ref 80.0–100.0)
Platelets: 301 10*3/uL (ref 150–400)
RBC: 3.96 MIL/uL (ref 3.87–5.11)
RDW: 16.1 % — ABNORMAL HIGH (ref 11.5–15.5)
WBC: 11.6 10*3/uL — ABNORMAL HIGH (ref 4.0–10.5)
nRBC: 0 % (ref 0.0–0.2)

## 2019-07-09 LAB — BASIC METABOLIC PANEL
Anion gap: 9 (ref 5–15)
BUN: 17 mg/dL (ref 8–23)
CO2: 26 mmol/L (ref 22–32)
Calcium: 8.9 mg/dL (ref 8.9–10.3)
Chloride: 95 mmol/L — ABNORMAL LOW (ref 98–111)
Creatinine, Ser: 0.76 mg/dL (ref 0.44–1.00)
GFR calc Af Amer: >60 mL/min (ref >60)
GFR calc non Af Amer: >60 mL/min (ref >60)
Glucose, Bld: 127 mg/dL — ABNORMAL HIGH (ref 70–99)
Potassium: 3.9 mmol/L (ref 3.5–5.1)
Sodium: 130 mmol/L — ABNORMAL LOW (ref 135–145)

## 2019-07-09 LAB — RESPIRATORY PANEL BY RT PCR (FLU A&B, COVID)
Influenza A by PCR: NEGATIVE
Influenza B by PCR: NEGATIVE
SARS Coronavirus 2 by RT PCR: NEGATIVE

## 2019-07-09 LAB — TROPONIN I (HIGH SENSITIVITY)
Troponin I (High Sensitivity): 5 ng/L (ref <18)
Troponin I (High Sensitivity): 5 ng/L (ref <18)

## 2019-07-09 MED ORDER — SODIUM CHLORIDE 0.9 % IV SOLN
2.0000 g | Freq: Once | INTRAVENOUS | Status: AC
  Administered 2019-07-09: 23:00:00 2 g via INTRAVENOUS
  Filled 2019-07-09: qty 2

## 2019-07-09 MED ORDER — IOHEXOL 350 MG/ML SOLN
75.0000 mL | Freq: Once | INTRAVENOUS | Status: AC | PRN
  Administered 2019-07-09: 75 mL via INTRAVENOUS

## 2019-07-09 MED ORDER — VANCOMYCIN HCL IN DEXTROSE 1-5 GM/200ML-% IV SOLN
1000.0000 mg | Freq: Once | INTRAVENOUS | Status: DC
  Filled 2019-07-09: qty 200

## 2019-07-09 MED ORDER — VANCOMYCIN HCL IN DEXTROSE 1-5 GM/200ML-% IV SOLN
1000.0000 mg | Freq: Once | INTRAVENOUS | Status: AC
  Administered 2019-07-09: 1000 mg via INTRAVENOUS
  Filled 2019-07-09: qty 200

## 2019-07-09 NOTE — H&P (Signed)
History and Physical    Cathy Watts WNI:[id number] DOB: Sep 19, 1945 DOA: 07/09/2019  PCP: Mar Daring, PA-C  Patient coming from: home   Chief Complaint: dyspnea  HPI: Cathy Watts is a 74 y.o. female with medical history significant for lung cancer s/p radiation/chemotherapy in active surveillance, osa not on cpap, paroxysmal a flutter, copd former smoker, aaa, barrett's esophagus, who presents with above.  Hospitalized last month for sob, treated for a-fib and copd exacerbation. Says never really improved after returning home, and symptoms have slowly worsened. Shortness of breath that is worse with exertion. Feeling very fatigued. Has developed a productive cough the past few days. No wheeze. No covid contacts. No fevers. No chest pain. Tolerating diet, no n/v/d. No abdominal pain. No new leg swelling or new orthopnea.  Recently started on levaquin, has been taking, hasn't noticed that it has helped. Had an echocardiogram performed by cardiology at [facility] 2 days ago, the read is pending  ED Course: labs, imaging, antibiotics. Initially hypoxic to mid-80s, resolved with 2 L [place]  Review of Systems: As per HPI otherwise 10 point review of systems negative.    Past Medical History:  Diagnosis Date  . Abdominal aortic aneurysm (AAA) 3.0 cm to 5.0 cm in diameter in female [facility]) 05/2018   being followed by dr. Delana Meyer. will reultrasound in 1 year  . Anxiety   . Asthma   . Barrett's esophagus   . COPD (chronic obstructive pulmonary disease) ([place])   . Depression   . GERD (gastroesophageal reflux disease)   . History of peptic ulcer disease   . Hypercholesterolemia   . Low oxygen saturation    2l hs  . Osteopenia   . Panic disorder   . Personal history of radiation therapy   . Personal history of tobacco use, presenting hazards to health 01/16/2015  . Requires supplemental oxygen 05/2018   supposed to use 2L NP at night but she currently does not.  encouraged patient  to resume this  . Sleep apnea    snores significantly but has never been tested for OSA  . SOB (shortness of breath)   . Squamous cell carcinoma of right lung ([place]) 2015    Past Surgical History:  Procedure Laterality Date  . CERVICAL CONE BIOPSY  1990  . CHOLECYSTECTOMY  1980's  . colonoscopy  2014  . COLONOSCOPY  07/21/2012  . COLONOSCOPY WITH PROPOFOL N/A 05/12/2018   Procedure: COLONOSCOPY WITH PROPOFOL;  Surgeon: Lollie Sails, MD;  Location: [facility] ENDOSCOPY;  Service: Endoscopy;  Laterality: N/A;  . ELBOW FRACTURE SURGERY Right 2009  . ENDOBRONCHIAL ULTRASOUND N/A 02/07/2015   Procedure: ENDOBRONCHIAL ULTRASOUND;  Surgeon: Flora Lipps, MD;  Location: [facility] ORS;  Service: Cardiopulmonary;  Laterality: N/A;  . ENDOBRONCHIAL ULTRASOUND Left 06/05/2018   Procedure: ENDOBRONCHIAL ULTRASOUND;  Surgeon: Flora Lipps, MD;  Location: [facility] ORS;  Service: Cardiopulmonary;  Laterality: Left;  . ESOPHAGOGASTRODUODENOSCOPY (EGD) WITH PROPOFOL N/A 02/26/2016   Procedure: ESOPHAGOGASTRODUODENOSCOPY (EGD) WITH PROPOFOL;  Surgeon: Lollie Sails, MD;  Location: [facility] ENDOSCOPY;  Service: Endoscopy;  Laterality: N/A;  COPD, Sleep Apnea  . ESOPHAGOGASTRODUODENOSCOPY (EGD) WITH PROPOFOL N/A 05/12/2018   Procedure: ESOPHAGOGASTRODUODENOSCOPY (EGD) WITH PROPOFOL;  Surgeon: Lollie Sails, MD;  Location: [facility] ENDOSCOPY;  Service: Endoscopy;  Laterality: N/A;  . EYE SURGERY Bilateral    cataract extractions  . FRACTURE SURGERY  2005   elbow repair  . PORTA CATH INSERTION N/A 06/17/2018   Procedure: PORTA CATH INSERTION;  Surgeon: Hortencia Pilar  G, MD;  Location: [place] CV LAB;  Service: Cardiovascular;  Laterality: N/A;  . TONSILLECTOMY  1979  . TUBAL LIGATION  1970's  . UPPER GI ENDOSCOPY       reports that she quit smoking about 20 months ago. Her smoking use included cigarettes. She has a 55.00 pack-year smoking history. She has never used smokeless tobacco. She reports current alcohol  use. She reports that she does not use drugs.  Allergies  Allergen Reactions  . Amoxicillin Hives and Itching    Did it involve swelling of the face/tongue/throat, SOB, or low BP? No Did it involve sudden or severe rash/hives, skin peeling, or any reaction on the inside of your mouth or nose? No Did you need to seek medical attention at a hospital or doctor's office? No When did it last happen?2018 If all above answers are "NO", may proceed with cephalosporin use.       Family History  Problem Relation Age of Onset  . Colon cancer Mother        colon  . Diabetes Mother   . Arthritis Mother   . Glaucoma Mother   . Stomach cancer Father        stomach  . Throat cancer Brother        throat  . Breast cancer Sister        breast  . Diabetes Sister   . Cirrhosis Sister   . Cancer Paternal Aunt   . Parkinson's disease Sister   . Heart attack Sister   . Ovarian cancer Sister     Prior to Admission medications   Medication Sig Start Date End Date Taking? Authorizing Provider  apixaban (ELIQUIS) 5 MG TABS tablet Take 1 tablet (5 mg total) by mouth 2 (two) times daily. 06/19/19  Yes Nicole Kindred A, DO  busPIRone (BUSPAR) 30 MG tablet Take 1 tablet by mouth twice daily Patient taking differently: Take 30 mg by mouth 2 (two) times daily.  05/17/19  Yes Mar Daring, PA-C  diltiazem (CARDIZEM CD) 240 MG 24 hr capsule Take 1 capsule (240 mg total) by mouth daily. 06/20/19  Yes Nicole Kindred A, DO  escitalopram (LEXAPRO) 20 MG tablet Take 1 tablet by mouth once daily Patient taking differently: Take 20 mg by mouth daily.  05/17/19  Yes Mar Daring, PA-C  Fluticasone-Umeclidin-Vilant (TRELEGY ELLIPTA) 100-62.5-25 MCG/INH AEPB Inhale 1 puff into the lungs daily. 12/28/18  Yes Flora Lipps, MD  furosemide (LASIX) 20 MG tablet Take 1 tablet (20 mg total) by mouth daily. 01/20/19  Yes Mar Daring, PA-C  ipratropium (ATROVENT HFA) 17 MCG/ACT inhaler Inhale 2  puffs into the lungs 4 (four) times daily. 06/19/19 06/18/20 Yes Nicole Kindred A, DO  ipratropium-albuterol (DUONEB) 0.5-2.5 (3) MG/3ML SOLN Take 3 mLs by nebulization every 6 (six) hours as needed. Patient taking differently: Take 3 mLs by nebulization every 6 (six) hours as needed (shortness of breath).  07/02/19  Yes Mar Daring, PA-C  levofloxacin (LEVAQUIN) 500 MG tablet Take 1 tablet (500 mg total) by mouth daily. 06/24/19  Yes Mar Daring, PA-C  Magnesium 250 MG TABS Take 250 mg by mouth 2 (two) times daily.    Yes [provider]  metoprolol tartrate (LOPRESSOR) 50 MG tablet Take 50 mg by mouth 2 (two) times daily. 06/23/19  Yes [provider]  omeprazole (PRILOSEC) 40 MG capsule Take 1 capsule by mouth once daily Patient taking differently: Take 40 mg by mouth daily.  04/23/19  Yes Mar Daring, PA-C  pravastatin (PRAVACHOL) 20 MG tablet TAKE 1 TABLET BY MOUTH AT BEDTIME Patient taking differently: Take 20 mg by mouth daily.  06/14/19  Yes Burnette, Clearnce Sorrel, PA-C  PROAIR HFA 108 (90 Base) MCG/ACT inhaler INHALE 2 PUFFS BY MOUTH EVERY 4 HOURS AS NEEDED FOR WHEEZING FOR SHORTNESS OF BREATH Patient taking differently: Inhale 2 puffs into the lungs every 4 (four) hours as needed for wheezing or shortness of breath.  06/15/19  Yes Kasa, Maretta Bees, MD  rOPINIRole (REQUIP) 3 MG tablet TAKE 1 TABLET BY MOUTH AT BEDTIME Patient taking differently: Take 3 mg by mouth at bedtime.  02/12/19  Yes Mar Daring, PA-C  sucralfate (CARAFATE) 1 g tablet Take 1 g by mouth 3 (three) times daily. DISSOLVE IN THREE TO FOUR TABLESPOONFULS OF WARM WARM SWISH AND SWALLOW 06/22/19  Yes [provider]  guaiFENesin-dextromethorphan (ROBITUSSIN DM) 100-10 MG/5ML syrup Take 5 mLs by mouth every 4 (four) hours as needed for cough. Patient not taking: Reported on 07/09/2019 05/27/19   Flora Lipps, MD  nystatin (MYCOSTATIN) 100000 UNIT/ML suspension Take 5 mLs (500,000  Units total) by mouth 4 (four) times daily. Patient not taking: Reported on 07/09/2019 06/21/19   Mar Daring, PA-C  polyethylene glycol powder (GLYCOLAX/MIRALAX) powder TAKE 17 GRAMS OF POWDER MIXED IN 8 OUNCES OF WATER BY MOUTH ONCE A DAY. Patient not taking: Reported on 07/09/2019 05/02/17   Mar Daring, PA-C  potassium chloride SA (K-DUR) 20 MEQ tablet Take 1 tablet (20 mEq total) by mouth daily. Patient not taking: Reported on 07/09/2019 10/22/18   Mar Daring, PA-C  Respiratory Therapy Supplies (FLUTTER) [place] 1 each by Does not apply route QID. 03/31/18   Flora Lipps, MD    Physical Exam: Vitals:   07/09/19 2030 07/09/19 2100 07/09/19 2130 07/09/19 2300  BP: 108/66 122/64 111/72 112/63  Pulse: (!) 112 (!) 104 92 (!) 116  Resp: (!) 23 19 (!) 24 19  Temp:      SpO2: 98% 99% 99% 97%  Weight:      Height:        Constitutional: No acute distress Head: Atraumatic Eyes: Conjunctiva clear ENM: Moist mucous membranes. Normal dentition.  Neck: Supple Respiratory: Clear to auscultation bilaterally, no wheezing/rales/rhonchi. Normal respiratory effort. No accessory muscle use. . Cardiovascular: Regular rate and rhythm. No murmurs/rubs/gallops. Abdomen: Non-tender, non-distended. No masses. No rebound or guarding. Positive bowel sounds. Musculoskeletal: No joint deformity upper and lower extremities. Normal ROM, no contractures. Normal muscle tone.  Skin: No rashes, lesions, or ulcers.  Extremities: No peripheral edema. Palpable peripheral pulses. Neurologic: Alert, moving all 4 extremities. Psychiatric: Normal insight and judgement.   Labs on Admission: I have personally reviewed following labs and imaging studies  CBC: Recent Labs  Lab 07/09/19 2019  WBC 11.6*  HGB 11.3*  HCT 35.7*  MCV 90.2  PLT 703   Basic Metabolic Panel: Recent Labs  Lab 07/06/19 1621 07/09/19 2019  NA 129* 130*  K 4.3 3.9  CL 92* 95*  CO2 27 26  GLUCOSE 106* 127*  BUN 16  17  CREATININE 0.76 0.76  CALCIUM 9.0 8.9   GFR: Estimated Creatinine Clearance: 85.5 mL/min (by C-G formula based on SCr of 0.76 mg/dL). Liver Function Tests: No results for input(s): AST, ALT, ALKPHOS, BILITOT, PROT, ALBUMIN in the last 168 hours. No results for input(s): LIPASE, AMYLASE in the last 168 hours. No results for input(s): AMMONIA in the last 168 hours. Coagulation Profile:  No results for input(s): INR, PROTIME in the last 168 hours. Cardiac Enzymes: No results for input(s): CKTOTAL, CKMB, CKMBINDEX, TROPONINI in the last 168 hours. BNP (last 3 results) No results for input(s): PROBNP in the last 8760 hours. HbA1C: No results for input(s): HGBA1C in the last 72 hours. CBG: No results for input(s): GLUCAP in the last 168 hours. Lipid Profile: No results for input(s): CHOL, HDL, LDLCALC, TRIG, CHOLHDL, LDLDIRECT in the last 72 hours. Thyroid Function Tests: No results for input(s): TSH, T4TOTAL, FREET4, T3FREE, THYROIDAB in the last 72 hours. Anemia Panel: No results for input(s): VITAMINB12, FOLATE, FERRITIN, TIBC, IRON, RETICCTPCT in the last 72 hours. Urine analysis:    Component Value Date/Time   COLORURINE YELLOW (A) 06/28/2019 1655   APPEARANCEUR CLEAR (A) 06/28/2019 1655   LABSPEC 1.014 06/28/2019 1655   PHURINE 5.0 06/28/2019 1655   GLUCOSEU NEGATIVE 06/28/2019 1655   HGBUR SMALL (A) 06/28/2019 1655   BILIRUBINUR NEGATIVE 06/28/2019 1655   KETONESUR NEGATIVE 06/28/2019 1655   PROTEINUR NEGATIVE 06/28/2019 1655   NITRITE NEGATIVE 06/28/2019 1655   LEUKOCYTESUR NEGATIVE 06/28/2019 1655    Radiological Exams on Admission: DG Chest 2 View  Result Date: 07/09/2019 CLINICAL DATA:  Shortness of breath EXAM: CHEST - 2 VIEW COMPARISON:  06/28/2019 FINDINGS: Perihilar airspace opacities are again noted. These have not substantially changed since the prior study. No pneumothorax. No large pleural effusion. The Port-A-Cath is stable in positioning. Heart size is  stable. Aortic calcifications are noted. IMPRESSION: 1. No acute cardiopulmonary process. 2. Stable appearance of the lungs as detailed above. Electronically Signed   By: Constance Holster M.D.   On: 07/09/2019 19:50   CT Angio Chest PE W and/or Wo Contrast  Result Date: 07/09/2019 CLINICAL DATA:  Shortness of breath x2 months. EXAM: CT ANGIOGRAPHY CHEST WITH CONTRAST TECHNIQUE: Multidetector CT imaging of the chest was performed using the standard protocol during bolus administration of intravenous contrast. Multiplanar CT image reconstructions and MIPs were obtained to evaluate the vascular anatomy. CONTRAST:  16mL OMNIPAQUE IOHEXOL 350 MG/ML SOLN COMPARISON:  April 12, 2019 FINDINGS: Cardiovascular: Contrast injection is sufficient to demonstrate satisfactory opacification of the pulmonary arteries to the segmental level. There is no pulmonary embolus. The main pulmonary artery is within normal limits for size. There is no CT evidence of acute right heart strain. There are atherosclerotic changes of the visualized thoracic aorta without evidence for an aneurysm. Heart size is normal. Coronary artery calcifications are noted. Mediastinum/Nodes: --there is new mediastinal adenopathy. For example there is a pretracheal lymph node measuring approximately 1 cm (axial series 4, image 34). There is a precarinal lymph node measuring approximately 1.4 cm in the short axis (previously measuring less than 4 mm). Hilar adenopathy is noted. --No axillary lymphadenopathy. --there are few mildly prominent left supraclavicular lymph nodes measuring up to approximately 0.8 cm (axial series 4, image 7). These are new since prior CT. --Normal thyroid gland. --The esophagus is unremarkable Lungs/Pleura: There is a worsening ill-defined left hilar mass causing compression of the left mainstem bronchus as well as the left upper and left lower lobe bronchi. There is worsening postobstructive atelectasis in the superior left lower  lobe and left upper lobe. The right perihilar region is essentially stable in appearance from prior study. There are small bilateral pleural effusions, right greater than left. Moderate emphysematous changes are noted bilaterally. There are branching airspace opacities in the left upper lobe concerning for pneumonia. There are few sub solid airspace opacities in the  medial right lower lobe which may represent postobstructive atelectasis or pneumonia. Upper Abdomen: No acute abnormality. Musculoskeletal: No chest wall abnormality. No acute or significant osseous findings. IMPRESSION: 1. No acute pulmonary embolism. 2. Postobstructive pneumonia in the left upper lobe. 3. Worsening postobstructive findings in the left upper and left lower lobes with worsening ill-defined soft tissue in the left hilum is concerning for disease progression. Pulmonary medicine follow-up is recommended. 4. Worsening mediastinal, hilar, and left supraclavicular adenopathy. While this may be reactive, nodal metastatic disease is not excluded. 5. Trace to small bilateral pleural effusions. Aortic Atherosclerosis (ICD10-I70.0) and Emphysema (ICD10-J43.9). Electronically Signed   By: Constance Holster M.D.   On: 07/09/2019 22:09    EKG: Independently reviewed. A flutter with rvr  Assessment/Plan Principal Problem:   Postobstructive pneumonia Active Problems:   Dyspnea   Apnea, sleep   Cancer of lower lobe of right lung (HCC)   Atrial flutter with rapid ventricular response ([place])   # postobstructive pneumonia # acute hypoxic respiraotry failure.  - with worsening mediastinal, hilar, and supracervical lymph nodes and trace effusion. Concerning for cancer recurrence. Hypoxia resolved with 2 L [place]; unclear to what extent underlying copd contributes to that. Has failed outpt levofloxacin. Didn't improve with steroids prescribed last month. No PE on CTA; troponin wnl x2 and not in pain. No new LE edema or CT findings suggestive of  fluid overload. - f/u bnp and [facility] TTE result - treat pna with vanc, cevepime, levofloxacin - will likely need pulmonary consult, possible oncology involvement - f/u sputum culture, urinary antigens for legionella and strep - f/u respiratory panel; contact/airborne for now - O2 - cont home trelegy, duonebs prn - incentive spirometry  # A-flutter with rvr - hr averaging around 110, ranging 100-120. Hemodynamically stable - increase dilt to 360 qd, cont metoprolol - treating pna as above - cont eliquis - f/u bnp and tte as above - cont home lasix, statin - tele; AM EKG  # Anxiety - cont home lexapro, buspar  # hyponatremia - 130, chronic, stable, likely 2/2 active pulmonary process - monitor, further w/u if worsening  # Anemia - h 11.3, from low end of normal. Denies melena - monitor  # restless leg - cont home requip  DVT prophylaxis: eliquis Code Status: dnr, confirmed w/ patient  Family Communication: daughter sherry, I did not update this evening  Disposition Plan: tbd  Consults called: none currently  Admission status: med/surg    Desma Maxim MD [facility] Pager [phone]  If 7PM-7AM, please contact night-coverage [email or web address] Password [facility]  07/09/2019, 11:34 PM

## 2019-07-09 NOTE — Telephone Encounter (Signed)
Made a telephone visit for today with Cathy Watts  (07/09/2019)   Thanks,   -Mickel Baas

## 2019-07-09 NOTE — ED Notes (Signed)
PT to XRAY

## 2019-07-09 NOTE — Progress Notes (Signed)
PHARMACY -  BRIEF ANTIBIOTIC NOTE   Pharmacy has received consult(s) for Cefepime and Vancomycin from an ED provider.  The patient's profile has been reviewed for ht/wt/allergies/indication/available labs.    One time order(s) placed for Cefepime 2gm and Vancomycin 2gm (1gm + 1gm)  Further antibiotics/pharmacy consults should be ordered by admitting physician if indicated.                       Thank you, Hart Robinsons A 07/09/2019  11:43 PM

## 2019-07-09 NOTE — ED Notes (Signed)
PT transported to CT>

## 2019-07-09 NOTE — Progress Notes (Deleted)
Patient: Cathy Watts Female    DOB: Nov 02, 1945   74 y.o.   MRN: [id number] Visit Date: 07/09/2019  Today's Provider: Mar Daring, PA-C   No chief complaint on file.  Subjective:    Virtual Visit via Telephone Note  I connected with Cathy Watts on 07/09/19 at  6:00 PM EST by telephone and verified that I am speaking with the correct person using two identifiers.  Location: Patient: *** Provider: ***   I discussed the limitations, risks, security and privacy concerns of performing an evaluation and management service by telephone and the availability of in person appointments. I also discussed with the patient that there may be a patient responsible charge related to this service. The patient expressed understanding and agreed to proceed.   HPI      Allergies  Allergen Reactions  . Amoxicillin Hives and Itching    Did it involve swelling of the face/tongue/throat, SOB, or low BP? No Did it involve sudden or severe rash/hives, skin peeling, or any reaction on the inside of your mouth or nose? No Did you need to seek medical attention at a hospital or doctor's office? No When did it last happen?2018 If all above answers are "NO", may proceed with cephalosporin use.        Current Outpatient Medications:  .  albuterol (VENTOLIN HFA) 108 (90 Base) MCG/ACT inhaler, Inhale 2 puffs into the lungs every 2 (two) hours as needed for wheezing or shortness of breath., Disp: 6.7 g, Rfl: 0 .  apixaban (ELIQUIS) 5 MG TABS tablet, Take 1 tablet (5 mg total) by mouth 2 (two) times daily., Disp: 60 tablet, Rfl: 1 .  busPIRone (BUSPAR) 30 MG tablet, Take 1 tablet by mouth twice daily, Disp: 180 tablet, Rfl: 1 .  diltiazem (CARDIZEM CD) 240 MG 24 hr capsule, Take 1 capsule (240 mg total) by mouth daily., Disp: 30 capsule, Rfl: 1 .  escitalopram (LEXAPRO) 20 MG tablet, Take 1 tablet by mouth once daily, Disp: 90 tablet, Rfl: 1 .  Fluticasone-Umeclidin-Vilant  (TRELEGY ELLIPTA) 100-62.5-25 MCG/INH AEPB, Inhale 1 puff into the lungs daily., Disp: 180 each, Rfl: 3 .  furosemide (LASIX) 20 MG tablet, Take 1 tablet (20 mg total) by mouth daily., Disp: 90 tablet, Rfl: 1 .  guaiFENesin-dextromethorphan (ROBITUSSIN DM) 100-10 MG/5ML syrup, Take 5 mLs by mouth every 4 (four) hours as needed for cough., Disp: 118 mL, Rfl: 0 .  ipratropium (ATROVENT HFA) 17 MCG/ACT inhaler, Inhale 2 puffs into the lungs 4 (four) times daily., Disp: 1 Inhaler, Rfl: 2 .  ipratropium-albuterol (DUONEB) 0.5-2.5 (3) MG/3ML SOLN, Take 3 mLs by nebulization every 6 (six) hours as needed., Disp: 360 mL, Rfl: 5 .  levofloxacin (LEVAQUIN) 500 MG tablet, Take 1 tablet (500 mg total) by mouth daily., Disp: 10 tablet, Rfl: 0 .  Magnesium 250 MG TABS, Take 250 mg by mouth 2 (two) times daily. , Disp: , Rfl:  .  nystatin (MYCOSTATIN) 100000 UNIT/ML suspension, Take 5 mLs (500,000 Units total) by mouth 4 (four) times daily., Disp: 240 mL, Rfl: 0 .  omeprazole (PRILOSEC) 40 MG capsule, Take 1 capsule by mouth once daily, Disp: 90 capsule, Rfl: 0 .  polyethylene glycol powder (GLYCOLAX/MIRALAX) powder, TAKE 17 GRAMS OF POWDER MIXED IN 8 OUNCES OF WATER BY MOUTH ONCE A DAY. (Patient taking differently: Take 1 Container by mouth daily. TAKE 17 GRAMS OF POWDER MIXED IN 8 OUNCES OF WATER BY MOUTH ONCE A DAY.),  Disp: 255 g, Rfl: 3 .  potassium chloride SA (K-DUR) 20 MEQ tablet, Take 1 tablet (20 mEq total) by mouth daily., Disp: 30 tablet, Rfl: 3 .  pravastatin (PRAVACHOL) 20 MG tablet, TAKE 1 TABLET BY MOUTH AT BEDTIME, Disp: 90 tablet, Rfl: 1 .  PROAIR HFA 108 (90 Base) MCG/ACT inhaler, INHALE 2 PUFFS BY MOUTH EVERY 4 HOURS AS NEEDED FOR WHEEZING FOR SHORTNESS OF BREATH, Disp: 9 g, Rfl: 0 .  Respiratory Therapy Supplies (FLUTTER) DEVI, 1 each by Does not apply route QID., Disp: 1 each, Rfl: 0 .  rOPINIRole (REQUIP) 3 MG tablet, TAKE 1 TABLET BY MOUTH AT BEDTIME, Disp: 90 tablet, Rfl: 1  Review of  Systems  Social History   Tobacco Use  . Smoking status: Former Smoker    Packs/day: 1.00    Years: 55.00    Pack years: 55.00    Types: Cigarettes    Quit date: 11/02/2017    Years since quitting: 1.6  . Smokeless tobacco: Never Used  Substance Use Topics  . Alcohol use: Yes    Alcohol/week: 0.0 standard drinks    Comment: occasionally- wine      Objective:   There were no vitals taken for this visit. There were no vitals filed for this visit.There is no height or weight on file to calculate BMI.   Physical Exam   No results found for any visits on 07/09/19.     Assessment & Plan    Follow Up Instructions:    I discussed the assessment and treatment plan with the patient. The patient was provided an opportunity to ask questions and all were answered. The patient agreed with the plan and demonstrated an understanding of the instructions.   The patient was advised to call back or seek an in-person evaluation if the symptoms worsen or if the condition fails to improve as anticipated.  I provided *** minutes of non-face-to-face time during this encounter.    Mar Daring, PA-C  [facility]

## 2019-07-09 NOTE — ED Notes (Signed)
Patient placed on 2L [place].

## 2019-07-09 NOTE — Progress Notes (Signed)
Name: Cathy Watts   MRN: [id number]    DOB: Sep 16, 1945   Date:07/09/2019       Progress Note Virtual Visit via Telephone Note  I connected with Cathy Watts on 07/09/19 at  6:00 PM EST by telephone and verified that I am speaking with the correct person using two identifiers.  Location: Patient: home Provider: [facility]   I discussed the limitations, risks, security and privacy concerns of performing an evaluation and management service by telephone and the availability of in person appointments. I also discussed with the patient that there may be a patient responsible charge related to this service. The patient expressed understanding and agreed to proceed.   Mar Daring, PA-C  Subjective  Chief Complaint  Chief Complaint  Patient presents with  . Shortness of Breath    HPI  Cathy Watts presents via telephone visit with her daughter, [name], on speaker phone. She is having progressive SOB, unable to talk in full sentences without trying to catch her breath or cough. She has been having issues since her ER visit on 06/28/19. I had evaluated her on 07/02/19 and she had been improving some. She had Duonebs added and was on levaquin. She was to continue all other medications as prescribed. She was then seen on 07/06/19 by pulmonology and therapy above continued, added flutter valve, increased lasix 40mg  (take 2 20mg  tabs) x 2 days, sleep study scheduled, echocardiogram on 07/07/19.   We have also been trying to get an MRI brain and Chest CT to r/o progression of lung cancer and r/o brain mets (having increasing and progressive weakness, unsteady gait, dizziness). She is scheduled for Brain MRI on 07/15/19 and Chest CT on 08/05/19.  Today her daughter, [name], called stating that her mother was even worse. She has been struggling to catch her breath, worse gait and unsteadiness. Feels like she cannot walk without help. While on the phone I had her check her pulse oximeter. Her oxygen  level was 82% RA and her HR was 126. She has also recently been hospitalized COPD exacerbation with atrial flutter with RVR on 06/17/19 and is followed by Dr. Cammie Sickle, cardiology.   No problem-specific Assessment & Plan notes found for this encounter.   Past Medical History:  Diagnosis Date  . Abdominal aortic aneurysm (AAA) 3.0 cm to 5.0 cm in diameter in female [facility]) 05/2018   being followed by dr. Delana Meyer. will reultrasound in 1 year  . Anxiety   . Asthma   . Barrett's esophagus   . COPD (chronic obstructive pulmonary disease) ([place])   . Depression   . GERD (gastroesophageal reflux disease)   . History of peptic ulcer disease   . Hypercholesterolemia   . Low oxygen saturation    2l hs  . Osteopenia   . Panic disorder   . Personal history of radiation therapy   . Personal history of tobacco use, presenting hazards to health 01/16/2015  . Requires supplemental oxygen 05/2018   supposed to use 2L NP at night but she currently does not.  encouraged patient to resume this  . Sleep apnea    snores significantly but has never been tested for OSA  . SOB (shortness of breath)   . 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 Smoking status: Former Smoker    Packs/day: 1.00    Years: 55.00    Pack years: 55.00    Types: Cigarettes  Quit date: 11/02/2017    Years since quitting: 1.6  . Smokeless tobacco: Never Used  Substance Use Topics  . Alcohol use: Yes    Alcohol/week: 0.0 standard drinks    Comment: occasionally- wine     Current Outpatient Medications:  .  albuterol (VENTOLIN HFA) 108 (90 Base) MCG/ACT inhaler, Inhale 2 puffs into the lungs every 2 (two) hours as needed for wheezing or shortness of breath., Disp: 6.7 g, Rfl: 0 .  apixaban (ELIQUIS) 5 MG TABS tablet, Take 1 tablet (5 mg total) by mouth 2 (two) times daily., Disp: 60 tablet, Rfl: 1 .  busPIRone (BUSPAR) 30 MG tablet, Take 1 tablet by mouth twice daily, Disp: 180  tablet, Rfl: 1 .  diltiazem (CARDIZEM CD) 240 MG 24 hr capsule, Take 1 capsule (240 mg total) by mouth daily., Disp: 30 capsule, Rfl: 1 .  escitalopram (LEXAPRO) 20 MG tablet, Take 1 tablet by mouth once daily, Disp: 90 tablet, Rfl: 1 .  Fluticasone-Umeclidin-Vilant (TRELEGY ELLIPTA) 100-62.5-25 MCG/INH AEPB, Inhale 1 puff into the lungs daily., Disp: 180 each, Rfl: 3 .  furosemide (LASIX) 20 MG tablet, Take 1 tablet (20 mg total) by mouth daily., Disp: 90 tablet, Rfl: 1 .  guaiFENesin-dextromethorphan (ROBITUSSIN DM) 100-10 MG/5ML syrup, Take 5 mLs by mouth every 4 (four) hours as needed for cough., Disp: 118 mL, Rfl: 0 .  ipratropium (ATROVENT HFA) 17 MCG/ACT inhaler, Inhale 2 puffs into the lungs 4 (four) times daily., Disp: 1 Inhaler, Rfl: 2 .  ipratropium-albuterol (DUONEB) 0.5-2.5 (3) MG/3ML SOLN, Take 3 mLs by nebulization every 6 (six) hours as needed., Disp: 360 mL, Rfl: 5 .  levofloxacin (LEVAQUIN) 500 MG tablet, Take 1 tablet (500 mg total) by mouth daily., Disp: 10 tablet, Rfl: 0 .  Magnesium 250 MG TABS, Take 250 mg by mouth 2 (two) times daily. , Disp: , Rfl:  .  nystatin (MYCOSTATIN) 100000 UNIT/ML suspension, Take 5 mLs (500,000 Units total) by mouth 4 (four) times daily., Disp: 240 mL, Rfl: 0 .  omeprazole (PRILOSEC) 40 MG capsule, Take 1 capsule by mouth once daily, Disp: 90 capsule, Rfl: 0 .  polyethylene glycol powder (GLYCOLAX/MIRALAX) powder, TAKE 17 GRAMS OF POWDER MIXED IN 8 OUNCES OF WATER BY MOUTH ONCE A DAY. (Patient taking differently: Take 1 Container by mouth daily. TAKE 17 GRAMS OF POWDER MIXED IN 8 OUNCES OF WATER BY MOUTH ONCE A DAY.), Disp: 255 g, Rfl: 3 .  potassium chloride SA (K-DUR) 20 MEQ tablet, Take 1 tablet (20 mEq total) by mouth daily., Disp: 30 tablet, Rfl: 3 .  pravastatin (PRAVACHOL) 20 MG tablet, TAKE 1 TABLET BY MOUTH AT BEDTIME, Disp: 90 tablet, Rfl: 1 .  PROAIR HFA 108 (90 Base) MCG/ACT inhaler, INHALE 2 PUFFS BY MOUTH EVERY 4 HOURS AS NEEDED FOR  WHEEZING FOR SHORTNESS OF BREATH, Disp: 9 g, Rfl: 0 .  Respiratory Therapy Supplies (FLUTTER) DEVI, 1 each by Does not apply route QID., Disp: 1 each, Rfl: 0 .  rOPINIRole (REQUIP) 3 MG tablet, TAKE 1 TABLET BY MOUTH AT BEDTIME, Disp: 90 tablet, Rfl: 1  Allergies  Allergen Reactions  . Amoxicillin Hives and Itching    Did it involve swelling of the face/tongue/throat, SOB, or low BP? No Did it involve sudden or severe rash/hives, skin peeling, or any reaction on the inside of your mouth or nose? No Did you need to seek medical attention at a hospital or doctor's office? No When did it last happen?2018 If all above answers  are "NO", may proceed with cephalosporin use.       Review of Systems  Constitutional: Positive for malaise/fatigue.  HENT: Negative.   Respiratory: Positive for cough, sputum production, shortness of breath and wheezing.   Cardiovascular: Positive for chest pain, orthopnea and leg swelling.  Genitourinary: Negative.   Musculoskeletal: Negative.   Neurological: Negative.      Objective  Vitals:   07/09/19 1801  Pulse: (!) 126  SpO2: (!) 82%    Physical Exam Constitutional:      General: She is in acute distress.  Pulmonary:     Effort: Tachypnea and respiratory distress present.     Breath sounds: Wheezing present.  Neurological:     Mental Status: She is alert.     Recent Results (from the past 2160 hour(s))  I-STAT creatinine     Status: None   Collection Time: 04/12/19  1:19 PM  Result Value Ref Range   Creatinine, Ser 0.90 0.44 - 1.00 mg/dL  Comprehensive metabolic panel     Status: Abnormal   Collection Time: 04/13/19  9:39 AM  Result Value Ref Range   Sodium 134 (L) 135 - 145 mmol/L   Potassium 4.0 3.5 - 5.1 mmol/L   Chloride 102 98 - 111 mmol/L   CO2 24 22 - 32 mmol/L   Glucose, Bld 109 (H) 70 - 99 mg/dL   BUN 18 8 - 23 mg/dL   Creatinine, Ser 0.88 0.44 - 1.00 mg/dL   Calcium 8.5 (L) 8.9 - 10.3 mg/dL   Total Protein 6.8 6.5  - 8.1 g/dL   Albumin 3.8 3.5 - 5.0 g/dL   AST 20 15 - 41 U/L   ALT 22 0 - 44 U/L   Alkaline Phosphatase 52 38 - 126 U/L   Total Bilirubin 0.4 0.3 - 1.2 mg/dL   GFR calc non Af Amer >60 >60 mL/min   GFR calc Af Amer >60 >60 mL/min   Anion gap 8 5 - 15    Comment: Performed at [facility], [address] [place], [place] [address]  CBC with Differential     Status: Abnormal   Collection Time: 04/13/19  9:39 AM  Result Value Ref Range   WBC 5.8 4.0 - 10.5 K/uL   RBC 4.43 3.87 - 5.11 MIL/uL   Hemoglobin 12.4 12.0 - 15.0 g/dL   HCT 40.3 36.0 - 46.0 %   MCV 91.0 80.0 - 100.0 fL   MCH 28.0 26.0 - 34.0 pg   MCHC 30.8 30.0 - 36.0 g/dL   RDW 16.2 (H) 11.5 - 15.5 %   Platelets 213 150 - 400 K/uL   nRBC 0.0 0.0 - 0.2 %   Neutrophils Relative % 48 %   Neutro Abs 2.8 1.7 - 7.7 K/uL   Lymphocytes Relative 30 %   Lymphs Abs 1.7 0.7 - 4.0 K/uL   Monocytes Relative 20 %   Monocytes Absolute 1.1 (H) 0.1 - 1.0 K/uL   Eosinophils Relative 1 %   Eosinophils Absolute 0.1 0.0 - 0.5 K/uL   Basophils Relative 1 %   Basophils Absolute 0.0 0.0 - 0.1 K/uL   Immature Granulocytes 0 %   Abs Immature Granulocytes 0.01 0.00 - 0.07 K/uL    Comment: Performed at [facility], [address] [place], [place] [address]  Basic metabolic panel     Status: Abnormal   Collection Time: 06/17/19  4:02 PM  Result Value Ref Range   Sodium 134 (L) 135 - 145  mmol/L   Potassium 4.8 3.5 - 5.1 mmol/L   Chloride 97 (L) 98 - 111 mmol/L   CO2 27 22 - 32 mmol/L   Glucose, Bld 148 (H) 70 - 99 mg/dL   BUN 14 8 - 23 mg/dL   Creatinine, Ser 0.71 0.44 - 1.00 mg/dL   Calcium 9.0 8.9 - 10.3 mg/dL   GFR calc non Af Amer >60 >60 mL/min   GFR calc Af Amer >60 >60 mL/min   Anion gap 10 5 - 15    Comment: Performed at [facility], [address] [place], [place] [address]  CBC     Status: Abnormal   Collection Time: 06/17/19  4:02 PM  Result Value Ref Range   WBC 11.9 (H) 4.0 - 10.5 K/uL   RBC 3.85  (L) 3.87 - 5.11 MIL/uL   Hemoglobin 11.2 (L) 12.0 - 15.0 g/dL   HCT 34.9 (L) 36.0 - 46.0 %   MCV 90.6 80.0 - 100.0 fL   MCH 29.1 26.0 - 34.0 pg   MCHC 32.1 30.0 - 36.0 g/dL   RDW 15.8 (H) 11.5 - 15.5 %   Platelets 368 150 - 400 K/uL   nRBC 0.0 0.0 - 0.2 %    Comment: Performed at [facility], [place], [place] [address]  Troponin I (High Sensitivity)     Status: None   Collection Time: 06/17/19  4:02 PM  Result Value Ref Range   Troponin I (High Sensitivity) 6 <18 ng/L    Comment: (NOTE) Elevated high sensitivity troponin I (hsTnI) values and significant  changes across serial measurements may suggest ACS but many other  chronic and acute conditions are known to elevate hsTnI results.  Refer to the "Links" section for chest pain algorithms and additional  guidance. Performed at [facility], [address] [place], [place] [address]   Respiratory Panel by RT PCR (Flu A&B, Covid) - Nasopharyngeal Swab     Status: None   Collection Time: 06/17/19  4:57 PM   Specimen: Nasopharyngeal Swab  Result Value Ref Range   SARS Coronavirus 2 by RT PCR NEGATIVE NEGATIVE    Comment: (NOTE) SARS-CoV-2 target nucleic acids are NOT DETECTED. The SARS-CoV-2 RNA is generally detectable in upper respiratoy specimens during the acute phase of infection. The lowest concentration of SARS-CoV-2 viral copies this assay can detect is 131 copies/mL. A negative result does not preclude SARS-Cov-2 infection and should not be used as the sole basis for treatment or other patient management decisions. A negative result may occur with  improper specimen collection/handling, submission of specimen other than nasopharyngeal swab, presence of viral mutation(s) within the areas targeted by this assay, and inadequate number of viral copies (<131 copies/mL). A negative result must be combined with clinical observations, patient history, and epidemiological information.  The expected result is Negative. Fact Sheet for Patients:  PinkCheek.be Fact Sheet for Healthcare Providers:  GravelBags.it This test is not yet ap proved or cleared by the [place] FDA and  has been authorized for detection and/or diagnosis of SARS-CoV-2 by FDA under an Emergency Use Authorization (EUA). This EUA will remain  in effect (meaning this test can be used) for the duration of the COVID-19 declaration under Section 564(b)(1) of the Act, 21 U.S.C. section 360bbb-3(b)(1), unless the authorization is terminated or revoked sooner.    Influenza A by PCR NEGATIVE NEGATIVE   Influenza B by PCR NEGATIVE NEGATIVE    Comment: (NOTE) The Xpert Xpress  SARS-CoV-2/FLU/RSV assay is intended as an aid in  the diagnosis of influenza from Nasopharyngeal swab specimens and  should not be used as a sole basis for treatment. Nasal washings and  aspirates are unacceptable for Xpert Xpress SARS-CoV-2/FLU/RSV  testing. Fact Sheet for Patients: PinkCheek.be Fact Sheet for Healthcare Providers: GravelBags.it This test is not yet approved or cleared by the [place] FDA and  has been authorized for detection and/or diagnosis of SARS-CoV-2 by  FDA under an Emergency Use Authorization (EUA). This EUA will remain  in effect (meaning this test can be used) for the duration of the  Covid-19 declaration under Section 564(b)(1) of the Act, 21  U.S.C. section 360bbb-3(b)(1), unless the authorization is  terminated or revoked. Performed at [facility], [place], [place] [address]   Troponin I (High Sensitivity)     Status: None   Collection Time: 06/17/19  6:29 PM  Result Value Ref Range   Troponin I (High Sensitivity) 5 <18 ng/L    Comment: (NOTE) Elevated high sensitivity troponin I (hsTnI) values and significant  changes across serial measurements  may suggest ACS but many other  chronic and acute conditions are known to elevate hsTnI results.  Refer to the "Links" section for chest pain algorithms and additional  guidance. Performed at [facility], [address] [place], [place] [address]   Basic metabolic panel     Status: Abnormal   Collection Time: 06/18/19  6:43 AM  Result Value Ref Range   Sodium 137 135 - 145 mmol/L   Potassium 4.7 3.5 - 5.1 mmol/L   Chloride 102 98 - 111 mmol/L   CO2 26 22 - 32 mmol/L   Glucose, Bld 155 (H) 70 - 99 mg/dL   BUN 15 8 - 23 mg/dL   Creatinine, Ser 0.69 0.44 - 1.00 mg/dL   Calcium 8.7 (L) 8.9 - 10.3 mg/dL   GFR calc non Af Amer >60 >60 mL/min   GFR calc Af Amer >60 >60 mL/min   Anion gap 9 5 - 15    Comment: Performed at [facility], [address] [place], [place] [address]  CBC     Status: Abnormal   Collection Time: 06/18/19  6:43 AM  Result Value Ref Range   WBC 10.0 4.0 - 10.5 K/uL   RBC 3.78 (L) 3.87 - 5.11 MIL/uL   Hemoglobin 11.0 (L) 12.0 - 15.0 g/dL   HCT 34.4 (L) 36.0 - 46.0 %   MCV 91.0 80.0 - 100.0 fL   MCH 29.1 26.0 - 34.0 pg   MCHC 32.0 30.0 - 36.0 g/dL   RDW 15.9 (H) 11.5 - 15.5 %   Platelets 373 150 - 400 K/uL   nRBC 0.0 0.0 - 0.2 %    Comment: Performed at [facility], [address] [place], [place] [address]  Procalcitonin - Baseline     Status: None   Collection Time: 06/18/19  8:00 AM  Result Value Ref Range   Procalcitonin 0.21 ng/mL    Comment:        Interpretation: PCT (Procalcitonin) <= 0.5 ng/mL: Systemic infection (sepsis) is not likely. Local bacterial infection is possible. (NOTE)       Sepsis PCT Algorithm           Lower Respiratory Tract  Infection PCT Algorithm    ----------------------------     ----------------------------         PCT < 0.25 ng/mL                PCT < 0.10 ng/mL         Strongly encourage             Strongly discourage   discontinuation of  antibiotics    initiation of antibiotics    ----------------------------     -----------------------------       PCT 0.25 - 0.50 ng/mL            PCT 0.10 - 0.25 ng/mL               OR       >80% decrease in PCT            Discourage initiation of                                            antibiotics      Encourage discontinuation           of antibiotics    ----------------------------     -----------------------------         PCT >= 0.50 ng/mL              PCT 0.26 - 0.50 ng/mL               AND        <80% decrease in PCT             Encourage initiation of                                             antibiotics       Encourage continuation           of antibiotics    ----------------------------     -----------------------------        PCT >= 0.50 ng/mL                  PCT > 0.50 ng/mL               AND         increase in PCT                  Strongly encourage                                      initiation of antibiotics    Strongly encourage escalation           of antibiotics                                     -----------------------------                                           PCT <= 0.25 ng/mL  OR                                        > 80% decrease in PCT                                     Discontinue / Do not initiate                                             antibiotics Performed at [facility], [address] [place], [place] [address]   CBC w/Diff/Platelet     Status: Abnormal   Collection Time: 06/23/19  4:01 PM  Result Value Ref Range   WBC 18.3 (H) 3.4 - 10.8 x10E3/uL   RBC 4.44 3.77 - 5.28 x10E6/uL   Hemoglobin 12.8 11.1 - 15.9 g/dL   Hematocrit 38.6 34.0 - 46.6 %   MCV 87 79 - 97 fL   MCH 28.8 26.6 - 33.0 pg   MCHC 33.2 31.5 - 35.7 g/dL   RDW 15.1 11.7 - 15.4 %   Platelets 537 (H) 150 - 450 x10E3/uL   Neutrophils 81 Not Estab. %   Lymphs 6 Not Estab. %   Monocytes 6 Not Estab. %    Eos 0 Not Estab. %   Basos 0 Not Estab. %   Immature Cells Note    Neutrophils Absolute 15.0 (H) 1.4 - 7.0 x10E3/uL   Lymphocytes Absolute 1.1 0.7 - 3.1 x10E3/uL   Monocytes Absolute 1.1 (H) 0.1 - 0.9 x10E3/uL   EOS (ABSOLUTE) 0.0 0.0 - 0.4 x10E3/uL   Basophils Absolute 0.0 0.0 - 0.2 x10E3/uL   Hematology Comments: Note:     Comment: Manual differential was performed.  Basic Metabolic Panel (BMET)     Status: Abnormal   Collection Time: 06/23/19  4:01 PM  Result Value Ref Range   Glucose 120 (H) 65 - 99 mg/dL   BUN 22 8 - 27 mg/dL   Creatinine, Ser 0.94 0.57 - 1.00 mg/dL   GFR calc non Af Amer 60 >59 mL/min/1.73   GFR calc Af Amer 70 >59 mL/min/1.73   BUN/Creatinine Ratio 23 12 - 28   Sodium 133 (L) 134 - 144 mmol/L   Potassium 4.9 3.5 - 5.2 mmol/L   Chloride 91 (L) 96 - 106 mmol/L   CO2 25 20 - 29 mmol/L   Calcium 9.6 8.7 - 10.3 mg/dL  Immature Cells     Status: Abnormal   Collection Time: 06/23/19  4:01 PM  Result Value Ref Range   Bands(Auto) Relative 1 Not Estab. %   Metamyelocytes 3 (H) 0 - 0 %   MYELOCYTES 3 (H) 0 - 0 %  Basic metabolic panel     Status: Abnormal   Collection Time: 06/28/19  3:15 PM  Result Value Ref Range   Sodium 125 (L) 135 - 145 mmol/L   Potassium 4.3 3.5 - 5.1 mmol/L   Chloride 89 (L) 98 - 111 mmol/L   CO2 27 22 - 32 mmol/L   Glucose, Bld 124 (H) 70 - 99 mg/dL   BUN 19 8 - 23 mg/dL   Creatinine, Ser 0.81 0.44 - 1.00 mg/dL   Calcium 8.3 (L) 8.9 -  10.3 mg/dL   GFR calc non Af Amer >60 >60 mL/min   GFR calc Af Amer >60 >60 mL/min   Anion gap 9 5 - 15    Comment: Performed at [facility], [address] [place], [place] [address]  CBC     Status: Abnormal   Collection Time: 06/28/19  3:15 PM  Result Value Ref Range   WBC 13.8 (H) 4.0 - 10.5 K/uL   RBC 4.18 3.87 - 5.11 MIL/uL   Hemoglobin 12.3 12.0 - 15.0 g/dL   HCT 36.7 36.0 - 46.0 %   MCV 87.8 80.0 - 100.0 fL   MCH 29.4 26.0 - 34.0 pg   MCHC 33.5 30.0 - 36.0 g/dL   RDW 15.6  (H) 11.5 - 15.5 %   Platelets 324 150 - 400 K/uL   nRBC 0.0 0.0 - 0.2 %    Comment: Performed at [facility], [address] [place], [place] [address]  Urinalysis, Complete w Microscopic     Status: Abnormal   Collection Time: 06/28/19  4:55 PM  Result Value Ref Range   Color, Urine YELLOW (A) YELLOW   APPearance CLEAR (A) CLEAR   Specific Gravity, Urine 1.014 1.005 - 1.030   pH 5.0 5.0 - 8.0   Glucose, UA NEGATIVE NEGATIVE mg/dL   Hgb urine dipstick SMALL (A) NEGATIVE   Bilirubin Urine NEGATIVE NEGATIVE   Ketones, ur NEGATIVE NEGATIVE mg/dL   Protein, ur NEGATIVE NEGATIVE mg/dL   Nitrite NEGATIVE NEGATIVE   Leukocytes,Ua NEGATIVE NEGATIVE   RBC / HPF 6-10 0 - 5 RBC/hpf   WBC, UA 0-5 0 - 5 WBC/hpf   Bacteria, UA NONE SEEN NONE SEEN   Squamous Epithelial / LPF 0-5 0 - 5   Mucus PRESENT     Comment: Performed at [facility], [place], [place] [address]  Troponin I (High Sensitivity)     Status: None   Collection Time: 06/28/19  5:06 PM  Result Value Ref Range   Troponin I (High Sensitivity) 5 <18 ng/L    Comment: (NOTE) Elevated high sensitivity troponin I (hsTnI) values and significant  changes across serial measurements may suggest ACS but many other  chronic and acute conditions are known to elevate hsTnI results.  Refer to the "Links" section for chest pain algorithms and additional  guidance. Performed at [facility], [address] [place], [place] [address]   Basic metabolic panel     Status: Abnormal   Collection Time: 07/06/19  4:21 PM  Result Value Ref Range   Sodium 129 (L) 135 - 145 mmol/L   Potassium 4.3 3.5 - 5.1 mmol/L   Chloride 92 (L) 98 - 111 mmol/L   CO2 27 22 - 32 mmol/L   Glucose, Bld 106 (H) 70 - 99 mg/dL    Comment: Glucose reference range applies only to samples taken after fasting for at least 8 hours.   BUN 16 8 - 23 mg/dL   Creatinine, Ser 0.76 0.44 - 1.00 mg/dL   Calcium 9.0 8.9 - 10.3 mg/dL    GFR calc non Af Amer >60 >60 mL/min   GFR calc Af Amer >60 >60 mL/min   Anion gap 10 5 - 15    Comment: Performed at [facility], [address] [place], [place] [address]  Expectorated sputum assessment w rflx to resp cult     Status: None   Collection Time: 07/06/19  4:30 PM   Specimen: Sputum  Result Value Ref Range   Specimen Description SPUTUM    Special Requests SPUTUM    Sputum evaluation      Sputum specimen not acceptable for testing.  Please recollect.   Performed at [facility], [address] [place], [place] [address]    Report Status 07/07/2019 FINAL      Assessment & Plan  1. Acute respiratory distress Due to patient having significant shortness of breath and decreased O2 sats have advised [name] to proceed to the ER for further evaluation.   I discussed the assessment and treatment plan with the patient. The patient was provided an opportunity to ask questions and all were answered. The patient agreed with the plan and demonstrated an understanding of the instructions.   The patient was advised to call back or seek an in-person evaluation if the symptoms worsen or if the condition fails to improve as anticipated.  I provided 8 minutes of non-face-to-face time during this encounter.

## 2019-07-09 NOTE — ED Provider Notes (Signed)
[facility] Emergency Department Provider Note  ____________________________________________   I have reviewed the triage vital signs and the nursing notes.   HISTORY  Chief Complaint Shortness of Breath   History limited by: Not Limited   HPI Cathy Watts is a 74 y.o. female who presents to the emergency department today because of concern for shortness of breath. The patient states she has been having worsening shortness of breath for the past two months. She is getting this worked up as an outpatient through her PCP and in fact saw pulmonology a couple of days ago. She has oxygen at home but only PRN. The patient has had occasional central chest pain with the shortness of breath. She has also had frequent cough which has been productive. She has noticed some blood in the phlegm. She denies any fevers. States she has had swollen ankles and feet for a while.    Records reviewed. Per medical record review patient has a history of COPD  Past Medical History:  Diagnosis Date  . Abdominal aortic aneurysm (AAA) 3.0 cm to 5.0 cm in diameter in female [facility]) 05/2018   being followed by dr. Delana Meyer. will reultrasound in 1 year  . Anxiety   . Asthma   . Barrett's esophagus   . COPD (chronic obstructive pulmonary disease) ([place])   . Depression   . GERD (gastroesophageal reflux disease)   . History of peptic ulcer disease   . Hypercholesterolemia   . Low oxygen saturation    2l hs  . Osteopenia   . Panic disorder   . Personal history of radiation therapy   . Personal history of tobacco use, presenting hazards to health 01/16/2015  . Requires supplemental oxygen 05/2018   supposed to use 2L NP at night but she currently does not.  encouraged patient to resume this  . Sleep apnea    snores significantly but has never been tested for OSA  . SOB (shortness of breath)   . Squamous cell carcinoma of right lung [facility]) 2015    Patient Active Problem List   Diagnosis Date Noted  . Atrial flutter with rapid ventricular response ([place]) 06/17/2019  . Left leg swelling 05/14/2018  . Elevated hemoglobin A1c 05/02/2018  . Abdominal aortic aneurysm (AAA) 3.0 cm to 5.0 cm in diameter in female ([place]) 06/30/2017  . Abdominal aortic atherosclerosis ([place]) 06/30/2017  . Chronic venous insufficiency 04/11/2016  . Varicose veins of bilateral lower extremities with pain 03/12/2016  . Cancer of lower lobe of right lung ([place]) 10/09/2015  . Oropharyngeal candidiasis 07/12/2015  . Malignant neoplasm of hilus of left lung ([place])   . Personal history of tobacco use, presenting hazards to health 01/16/2015  . Airway hyperreactivity 11/15/2014  . CN (constipation) 11/15/2014  . COPD with acute exacerbation ([place]) 11/15/2014  . Daytime somnolence 11/15/2014  . Clinical depression 11/15/2014  . DD (diverticular disease) 11/15/2014  . Accumulation of fluid in tissues 11/15/2014  . H/O peptic ulcer 11/15/2014  . Osteopenia 11/15/2014  . Awareness of heartbeats 11/15/2014  . Episodic paroxysmal anxiety disorder 11/15/2014  . Apnea, sleep 11/15/2014  . Snores 11/15/2014  . Breath shortness 11/15/2014  . Compulsive tobacco user syndrome 11/15/2014  . Avitaminosis D 11/15/2014  . Barrett esophagus 10/19/2014  . Acid reflux 10/19/2014  . COPD, moderate ([place]) 08/31/2013  . Dyspnea 08/31/2013  . Otitis, externa, infective 08/31/2013  . Nocturnal hypoxemia 08/31/2013  . Hypercholesterolemia without hypertriglyceridemia 11/05/2005    Past Surgical History:  Procedure  Laterality Date  . CERVICAL CONE BIOPSY  1990  . CHOLECYSTECTOMY  1980's  . colonoscopy  2014  . COLONOSCOPY  07/21/2012  . COLONOSCOPY WITH PROPOFOL N/A 05/12/2018   Procedure: COLONOSCOPY WITH PROPOFOL;  Surgeon: Lollie Sails, MD;  Location: [facility] ENDOSCOPY;  Service: Endoscopy;  Laterality: N/A;  . ELBOW FRACTURE SURGERY Right 2009  . ENDOBRONCHIAL ULTRASOUND N/A 02/07/2015   Procedure:  ENDOBRONCHIAL ULTRASOUND;  Surgeon: Flora Lipps, MD;  Location: [facility] ORS;  Service: Cardiopulmonary;  Laterality: N/A;  . ENDOBRONCHIAL ULTRASOUND Left 06/05/2018   Procedure: ENDOBRONCHIAL ULTRASOUND;  Surgeon: Flora Lipps, MD;  Location: [facility] ORS;  Service: Cardiopulmonary;  Laterality: Left;  . ESOPHAGOGASTRODUODENOSCOPY (EGD) WITH PROPOFOL N/A 02/26/2016   Procedure: ESOPHAGOGASTRODUODENOSCOPY (EGD) WITH PROPOFOL;  Surgeon: Lollie Sails, MD;  Location: [facility] ENDOSCOPY;  Service: Endoscopy;  Laterality: N/A;  COPD, Sleep Apnea  . ESOPHAGOGASTRODUODENOSCOPY (EGD) WITH PROPOFOL N/A 05/12/2018   Procedure: ESOPHAGOGASTRODUODENOSCOPY (EGD) WITH PROPOFOL;  Surgeon: Lollie Sails, MD;  Location: [facility] ENDOSCOPY;  Service: Endoscopy;  Laterality: N/A;  . EYE SURGERY Bilateral    cataract extractions  . FRACTURE SURGERY  2005   elbow repair  . PORTA CATH INSERTION N/A 06/17/2018   Procedure: PORTA CATH INSERTION;  Surgeon: Katha Cabal, MD;  Location: [place] CV LAB;  Service: Cardiovascular;  Laterality: N/A;  . TONSILLECTOMY  1979  . TUBAL LIGATION  1970's  . UPPER GI ENDOSCOPY      Prior to Admission medications   Medication Sig Start Date End Date Taking? Authorizing Provider  albuterol (VENTOLIN HFA) 108 (90 Base) MCG/ACT inhaler Inhale 2 puffs into the lungs every 2 (two) hours as needed for wheezing or shortness of breath. 06/19/19   Ezekiel Slocumb, DO  apixaban (ELIQUIS) 5 MG TABS tablet Take 1 tablet (5 mg total) by mouth 2 (two) times daily. 06/19/19   Ezekiel Slocumb, DO  busPIRone (BUSPAR) 30 MG tablet Take 1 tablet by mouth twice daily 05/17/19   Mar Daring, PA-C  diltiazem (CARDIZEM CD) 240 MG 24 hr capsule Take 1 capsule (240 mg total) by mouth daily. 06/20/19   Ezekiel Slocumb, DO  escitalopram (LEXAPRO) 20 MG tablet Take 1 tablet by mouth once daily 05/17/19   Mar Daring, PA-C  Fluticasone-Umeclidin-Vilant (TRELEGY ELLIPTA) 100-62.5-25  MCG/INH AEPB Inhale 1 puff into the lungs daily. 12/28/18   Flora Lipps, MD  furosemide (LASIX) 20 MG tablet Take 1 tablet (20 mg total) by mouth daily. 01/20/19   Mar Daring, PA-C  guaiFENesin-dextromethorphan (ROBITUSSIN DM) 100-10 MG/5ML syrup Take 5 mLs by mouth every 4 (four) hours as needed for cough. 05/27/19   Flora Lipps, MD  ipratropium (ATROVENT HFA) 17 MCG/ACT inhaler Inhale 2 puffs into the lungs 4 (four) times daily. 06/19/19 06/18/20  Nicole Kindred A, DO  ipratropium-albuterol (DUONEB) 0.5-2.5 (3) MG/3ML SOLN Take 3 mLs by nebulization every 6 (six) hours as needed. 07/02/19   Mar Daring, PA-C  levofloxacin (LEVAQUIN) 500 MG tablet Take 1 tablet (500 mg total) by mouth daily. 06/24/19   Mar Daring, PA-C  Magnesium 250 MG TABS Take 250 mg by mouth 2 (two) times daily.     [provider]  nystatin (MYCOSTATIN) 100000 UNIT/ML suspension Take 5 mLs (500,000 Units total) by mouth 4 (four) times daily. 06/21/19   Mar Daring, PA-C  omeprazole (PRILOSEC) 40 MG capsule Take 1 capsule by mouth once daily 04/23/19   Fenton Malling M, PA-C  polyethylene glycol  powder (GLYCOLAX/MIRALAX) powder TAKE 17 GRAMS OF POWDER MIXED IN 8 OUNCES OF WATER BY MOUTH ONCE A DAY. Patient taking differently: Take 1 Container by mouth daily. TAKE 17 GRAMS OF POWDER MIXED IN 8 OUNCES OF WATER BY MOUTH ONCE A DAY. 05/02/17   Mar Daring, PA-C  potassium chloride SA (K-DUR) 20 MEQ tablet Take 1 tablet (20 mEq total) by mouth daily. 10/22/18   Mar Daring, PA-C  pravastatin (PRAVACHOL) 20 MG tablet TAKE 1 TABLET BY MOUTH AT BEDTIME 06/14/19   Mar Daring, PA-C  PROAIR HFA 108 (90 Base) MCG/ACT inhaler INHALE 2 PUFFS BY MOUTH EVERY 4 HOURS AS NEEDED FOR WHEEZING FOR SHORTNESS OF BREATH 06/15/19   Flora Lipps, MD  Respiratory Therapy Supplies (FLUTTER) DEVI 1 each by Does not apply route QID. 03/31/18   Flora Lipps, MD  rOPINIRole (REQUIP) 3 MG  tablet TAKE 1 TABLET BY MOUTH AT BEDTIME 02/12/19   Mar Daring, PA-C    Allergies Amoxicillin  Family History  Problem Relation Age of Onset  . Colon cancer Mother        colon  . Diabetes Mother   . Arthritis Mother   . Glaucoma Mother   . Stomach cancer Father        stomach  . Throat cancer Brother        throat  . Breast cancer Sister        breast  . Diabetes Sister   . Cirrhosis Sister   . Cancer Paternal Aunt   . Parkinson's disease Sister   . Heart attack Sister   . Ovarian cancer Sister     Social History Social History   Tobacco Use  . Smoking status: Former Smoker    Packs/day: 1.00    Years: 55.00    Pack years: 55.00    Types: Cigarettes    Quit date: 11/02/2017    Years since quitting: 1.6  . Smokeless tobacco: Never Used  Substance Use Topics  . Alcohol use: Yes    Alcohol/week: 0.0 standard drinks    Comment: occasionally- wine  . Drug use: No    Review of Systems Constitutional: No fever/chills Eyes: No visual changes. ENT: No sore throat. Cardiovascular: Positive for chest pain. Respiratory: Positive for shortness of breath. Gastrointestinal: No abdominal pain.  No nausea, no vomiting.  No diarrhea.   Genitourinary: Negative for dysuria. Musculoskeletal: Negative for back pain. Skin: Negative for rash. Neurological: Negative for headaches, focal weakness or numbness.  ____________________________________________   PHYSICAL EXAM:  VITAL SIGNS: ED Triage Vitals  Enc Vitals Group     BP 07/09/19 1906 111/68     Pulse Rate 07/09/19 1851 (!) 115     Resp 07/09/19 1906 (!) 28     Temp 07/09/19 1906 98.2 F (36.8 C)     Temp src --      SpO2 07/09/19 1851 93 %     Weight 07/09/19 1908 250 lb (113.4 kg)     Height 07/09/19 1908 5\' 10"  (1.778 m)     Head Circumference --      Peak Flow --      Pain Score 07/09/19 1907 1   Constitutional: Alert and oriented.  Eyes: Conjunctivae are normal.  ENT      Head: Normocephalic  and atraumatic.      Nose: No congestion/rhinnorhea.      Mouth/Throat: Mucous membranes are moist.      Neck: No stridor. Hematological/Lymphatic/Immunilogical: No cervical lymphadenopathy. Cardiovascular:  Tachycardic, regular rhythm.  No murmurs, rubs, or gallops.  Respiratory: Slightly increased respiratory rate and effort. Diffuse expiratory wheezing. Gastrointestinal: Soft and non tender. No rebound. No guarding.  Genitourinary: Deferred Musculoskeletal: Normal range of motion in all extremities. Bilateral lower extremity edema.  Neurologic:  Normal speech and language. No gross focal neurologic deficits are appreciated.  Skin:  Skin is warm, dry and intact. No rash noted. Psychiatric: Mood and affect are normal. Speech and behavior are normal. Patient exhibits appropriate insight and judgment.  ____________________________________________    LABS (pertinent positives/negatives)  BMP na 130, k 3.9, glu 127, cr 0.76 CBC wbc 11.6, hgb 11.3, plt 301 BNP 168 Trop hs 5  ____________________________________________   EKG  I, Nance Pear, attending physician, personally viewed and interpreted this EKG  EKG Time: 1904 Rate: 119 Rhythm: atrial flutter with variable block Axis: normal Intervals: qtc 447 QRS: narrow ST changes: no st elevation Impression: abnormal ekg  ____________________________________________    RADIOLOGY  CXR No acute abnormality  ____________________________________________   PROCEDURES  Procedures  ____________________________________________   INITIAL IMPRESSION / ASSESSMENT AND PLAN / ED COURSE  Pertinent labs & imaging results that were available during my care of the patient were reviewed by me and considered in my medical decision making (see chart for details).   Patient presented to the emergency department because of concern for shortness of breath. Patient does have history of cancer. CXR without any acute findings, however  I did have concern for possible PE given hypoxia, tachycardia and coughing up blood. CT angio was obtained and while this did not show a PE it was concerning for post obstructive pneumonia. I discussed the CT findings including neoplastic findings with patient. Do think she would benefit from IV abx so will plan on admission.  ____________________________________________   FINAL CLINICAL IMPRESSION(S) / ED DIAGNOSES  Final diagnoses:  Shortness of breath  Pneumonia due to infectious organism, unspecified laterality, unspecified part of lung     Note: This dictation was prepared with Dragon dictation. Any transcriptional errors that result from this process are unintentional     Nance Pear, MD 07/10/19 1606

## 2019-07-09 NOTE — ED Notes (Signed)
First Nurse Note: Pt to ED via POV for high heart rate and low SpO2. Pt does not appear to be in any distress. Color WNL.

## 2019-07-09 NOTE — ED Triage Notes (Signed)
Patient c/o SOB and cough X 2 months. Patient reports HR in the 120s-130s today with oxygen saturation levels in the mid 80s at home. Patient with audible wheezes throughout all lung fields. Patient with labored respirations, 4-5 word sentences.

## 2019-07-10 ENCOUNTER — Other Ambulatory Visit: Payer: Self-pay

## 2019-07-10 LAB — CBC
HCT: 33 % — ABNORMAL LOW (ref 36.0–46.0)
Hemoglobin: 10.6 g/dL — ABNORMAL LOW (ref 12.0–15.0)
MCH: 28.7 pg (ref 26.0–34.0)
MCHC: 32.1 g/dL (ref 30.0–36.0)
MCV: 89.4 fL (ref 80.0–100.0)
Platelets: 303 10*3/uL (ref 150–400)
RBC: 3.69 MIL/uL — ABNORMAL LOW (ref 3.87–5.11)
RDW: 16 % — ABNORMAL HIGH (ref 11.5–15.5)
WBC: 10.8 10*3/uL — ABNORMAL HIGH (ref 4.0–10.5)
nRBC: 0 % (ref 0.0–0.2)

## 2019-07-10 LAB — STREP PNEUMONIAE URINARY ANTIGEN: Strep Pneumo Urinary Antigen: NEGATIVE

## 2019-07-10 LAB — BASIC METABOLIC PANEL
Anion gap: 7 (ref 5–15)
BUN: 19 mg/dL (ref 8–23)
CO2: 27 mmol/L (ref 22–32)
Calcium: 7.9 mg/dL — ABNORMAL LOW (ref 8.9–10.3)
Chloride: 101 mmol/L (ref 98–111)
Creatinine, Ser: 0.68 mg/dL (ref 0.44–1.00)
GFR calc Af Amer: >60 mL/min (ref >60)
GFR calc non Af Amer: >60 mL/min (ref >60)
Glucose, Bld: 137 mg/dL — ABNORMAL HIGH (ref 70–99)
Potassium: 3.8 mmol/L (ref 3.5–5.1)
Sodium: 135 mmol/L (ref 135–145)

## 2019-07-10 LAB — BRAIN NATRIURETIC PEPTIDE: B Natriuretic Peptide: 168 pg/mL — ABNORMAL HIGH (ref 0.0–100.0)

## 2019-07-10 LAB — POC SARS CORONAVIRUS 2 AG: SARS Coronavirus 2 Ag: NEGATIVE

## 2019-07-10 MED ORDER — IPRATROPIUM-ALBUTEROL 0.5-2.5 (3) MG/3ML IN SOLN
3.0000 mL | Freq: Four times a day (QID) | RESPIRATORY_TRACT | Status: DC | PRN
  Administered 2019-07-13 – 2019-07-15 (×5): 3 mL via RESPIRATORY_TRACT
  Filled 2019-07-10 (×6): qty 3

## 2019-07-10 MED ORDER — SODIUM CHLORIDE 0.9 % IV SOLN: 250.0000 mL | INTRAVENOUS | Status: DC | PRN

## 2019-07-10 MED ORDER — LEVOFLOXACIN IN D5W 750 MG/150ML IV SOLN
750.0000 mg | Freq: Every day | INTRAVENOUS | Status: DC
  Administered 2019-07-10 – 2019-07-13 (×4): 750 mg via INTRAVENOUS
  Filled 2019-07-10 (×4): qty 150

## 2019-07-10 MED ORDER — BUSPIRONE HCL 10 MG PO TABS
30.0000 mg | ORAL_TABLET | Freq: Two times a day (BID) | ORAL | Status: DC
  Administered 2019-07-10 – 2019-07-15 (×11): 30 mg via ORAL
  Filled 2019-07-10 (×12): qty 3

## 2019-07-10 MED ORDER — UMECLIDINIUM BROMIDE 62.5 MCG/INH IN AEPB
1.0000 | INHALATION_SPRAY | Freq: Every morning | RESPIRATORY_TRACT | Status: DC
  Administered 2019-07-10 – 2019-07-15 (×6): 1 via RESPIRATORY_TRACT
  Filled 2019-07-10: qty 7

## 2019-07-10 MED ORDER — SODIUM CHLORIDE 0.9% FLUSH
3.0000 mL | INTRAVENOUS | Status: DC | PRN
  Administered 2019-07-12: 3 mL via INTRAVENOUS

## 2019-07-10 MED ORDER — PRAVASTATIN SODIUM 20 MG PO TABS
20.0000 mg | ORAL_TABLET | Freq: Every day | ORAL | Status: DC
  Administered 2019-07-10 – 2019-07-15 (×6): 20 mg via ORAL
  Filled 2019-07-10 (×6): qty 1

## 2019-07-10 MED ORDER — ROPINIROLE HCL 1 MG PO TABS
3.0000 mg | ORAL_TABLET | Freq: Every day | ORAL | Status: DC
  Administered 2019-07-10 – 2019-07-14 (×6): 3 mg via ORAL
  Filled 2019-07-10 (×6): qty 3

## 2019-07-10 MED ORDER — ESCITALOPRAM OXALATE 10 MG PO TABS
20.0000 mg | ORAL_TABLET | Freq: Every day | ORAL | Status: DC
  Administered 2019-07-10 – 2019-07-15 (×6): 20 mg via ORAL
  Filled 2019-07-10 (×6): qty 2

## 2019-07-10 MED ORDER — CHLORHEXIDINE GLUCONATE CLOTH 2 % EX PADS: 6.0000 | MEDICATED_PAD | Freq: Every day | CUTANEOUS | Status: DC

## 2019-07-10 MED ORDER — FLUTICASONE-UMECLIDIN-VILANT 100-62.5-25 MCG/INH IN AEPB: 1.0000 | INHALATION_SPRAY | Freq: Every day | RESPIRATORY_TRACT | Status: DC

## 2019-07-10 MED ORDER — HYDROCODONE-HOMATROPINE 5-1.5 MG/5ML PO SYRP
5.0000 mL | ORAL_SOLUTION | ORAL | Status: DC | PRN
  Administered 2019-07-10 – 2019-07-14 (×16): 5 mL via ORAL
  Filled 2019-07-10 (×17): qty 5

## 2019-07-10 MED ORDER — FUROSEMIDE 20 MG PO TABS
20.0000 mg | ORAL_TABLET | Freq: Every day | ORAL | Status: DC
  Administered 2019-07-10 – 2019-07-15 (×6): 20 mg via ORAL
  Filled 2019-07-10 (×6): qty 1

## 2019-07-10 MED ORDER — MAGNESIUM OXIDE 400 (241.3 MG) MG PO TABS
200.0000 mg | ORAL_TABLET | Freq: Two times a day (BID) | ORAL | Status: DC
  Administered 2019-07-10 – 2019-07-15 (×11): 200 mg via ORAL
  Filled 2019-07-10 (×12): qty 1

## 2019-07-10 MED ORDER — SODIUM CHLORIDE 0.9% FLUSH
3.0000 mL | Freq: Two times a day (BID) | INTRAVENOUS | Status: DC
  Administered 2019-07-10 – 2019-07-15 (×11): 3 mL via INTRAVENOUS

## 2019-07-10 MED ORDER — DILTIAZEM HCL ER COATED BEADS 180 MG PO CP24
360.0000 mg | ORAL_CAPSULE | Freq: Every day | ORAL | Status: DC
  Administered 2019-07-10 – 2019-07-15 (×6): 360 mg via ORAL
  Filled 2019-07-10 (×6): qty 2

## 2019-07-10 MED ORDER — APIXABAN 5 MG PO TABS
5.0000 mg | ORAL_TABLET | Freq: Two times a day (BID) | ORAL | Status: DC
  Administered 2019-07-10 – 2019-07-12 (×5): 5 mg via ORAL
  Filled 2019-07-10 (×6): qty 1

## 2019-07-10 MED ORDER — PANTOPRAZOLE SODIUM 40 MG PO TBEC
40.0000 mg | DELAYED_RELEASE_TABLET | Freq: Every day | ORAL | Status: DC
  Administered 2019-07-10 – 2019-07-15 (×6): 40 mg via ORAL
  Filled 2019-07-10 (×6): qty 1

## 2019-07-10 MED ORDER — FLUTICASONE FUROATE-VILANTEROL 100-25 MCG/INH IN AEPB
1.0000 | INHALATION_SPRAY | Freq: Every morning | RESPIRATORY_TRACT | Status: DC
  Administered 2019-07-10 – 2019-07-14 (×5): 1 via RESPIRATORY_TRACT
  Filled 2019-07-10: qty 28

## 2019-07-10 MED ORDER — METOPROLOL TARTRATE 50 MG PO TABS
50.0000 mg | ORAL_TABLET | Freq: Two times a day (BID) | ORAL | Status: DC
  Administered 2019-07-10 – 2019-07-15 (×9): 50 mg via ORAL
  Filled 2019-07-10 (×12): qty 1

## 2019-07-10 NOTE — ED Notes (Signed)
Pt resting in bed this time, denies pain. States she is ready to be admitted. Call light in reach.

## 2019-07-10 NOTE — Plan of Care (Signed)
  Problem: Education: Goal: Knowledge of General Education information will improve Description: Including pain rating scale, medication(s)/side effects and non-pharmacologic comfort measures Outcome: Progressing   Problem: Clinical Measurements: Goal: Ability to maintain clinical measurements within normal limits will improve Outcome: Progressing Goal: Will remain free from infection Outcome: Progressing Goal: Diagnostic test results will improve Outcome: Progressing Goal: Respiratory complications will improve Outcome: Progressing Goal: Cardiovascular complication will be avoided Outcome: Progressing   Problem: Activity: Goal: Risk for activity intolerance will decrease Outcome: Progressing   Problem: Coping: Goal: Level of anxiety will decrease Outcome: Progressing   Problem: Pain Managment: Goal: General experience of comfort will improve Outcome: Progressing   Problem: Safety: Goal: Ability to remain free from injury will improve Outcome: Progressing   Problem: Skin Integrity: Goal: Risk for impaired skin integrity will decrease Outcome: Progressing

## 2019-07-10 NOTE — Progress Notes (Signed)
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 She also has atrial fibrillation, COPD exacerbation, OSA without CPAP, AAA, Barrett's esophagus.   The patient presented to ED with complaints of increasing shortness of breath. She states that it has been getting harder to breath for the last 2 months. The patient has recently been evaluated at [facility] for this shortness of breath. They seemed to think that it was due to her atrial fibrillation. Their recommendation was for CCB and beta blockade for heart rate control and initiation of apixaban for stroke prophylaxis. (CHADSVASC2 - 1, HASBLED 1%). Echocardiogram was performed on 07/07/2019. No report is available in "Care Everywhere".   However, CT chest performed in the ED demonstrated a worsening ill-defined left hilar mass causing compression of the left mainstem bronchus as well as the left upper and left lower lobe bronchi. There is worsening postobstructive atelectasis in the superior left lower lobe and left upper lobe. The right perihilar region is essentially stable in appearance from prior study.There are small bilateral pleural effusions, right greater than left. Moderate emphysematous changes are noted bilaterally. There are branching airspace opacities in the left upper lobe concerning for pneumonia. There are few sub solid airspace opacities in the medial right lower lobe which may represent postobstructive atelectasis or pneumonia.  [facility] were consulted to admit the patient for further evaluation and treatment for post obstructive pneumonia. She has been admitted to a telemetry bed. She is  receiving IV Cefepime, Levaquin, and vancomycin. Pulmonology will be consulted as the patient may benefit from bronchoscopy/biopsy. However, the patient is on apixaban so this will likely have to wait until the beginning of the week.   Consultants  . Pulmonology  Procedures  . None  Antibiotics   Anti-infectives (From admission, onward)   Start     Dose/Rate Route Frequency Ordered Stop   07/10/19 1000  levofloxacin (LEVAQUIN) IVPB 750 mg     750 mg 100 mL/hr over 90 Minutes Intravenous Daily 07/10/19 0142     07/09/19 2345  vancomycin (VANCOCIN) IVPB 1000 mg/200 mL premix     1,000 mg 200 mL/hr over 60 Minutes Intravenous  Once 07/09/19 2342     07/09/19 2230  vancomycin (VANCOCIN) IVPB 1000 mg/200 mL premix     1,000 mg 200 mL/hr over 60 Minutes Intravenous  Once 07/09/19 2219 07/10/19 0037   07/09/19 2230  ceFEPIme (MAXIPIME) 2 g in sodium chloride 0.9 % 100 mL IVPB     2 g 200 mL/hr over 30 Minutes Intravenous  Once 07/09/19 2219 07/09/19 2335    .  Subjective  The patient is resting comfortably. No new complaints. She states that she is feeling better.  Objective   Vitals:  Vitals:   07/10/19 0112 07/10/19 0729  BP: 126/71 119/71  Pulse: 94 (!) 119  Resp: 18 17  Temp: 98 F (36.7 C) 97.8 F (36.6 C)  SpO2: 97% 94%   Exam:  Constitutional:  . The patient is awake, alert, and oriented x 3. No acute distress. Respiratory:  . No increased work of breathing. . No wheezes, rales, or rhonchi . No tactile fremitus Cardiovascular:  . Regular rate  and rhythm . No murmurs, ectopy, or gallups. . No lateral PMI. No thrills. Abdomen:  . Abdomen is soft, non-tender, non-distended . No hernias, masses, or organomegaly . Normoactive bowel sounds.  Musculoskeletal:  . No cyanosis, clubbing, or edema Skin:  . No rashes, lesions, ulcers . palpation of skin: no induration or nodules Neurologic:  . CN 2-12 intact . Sensation all 4 extremities intact Psychiatric:  .  Mental status o Mood, affect appropriate o Orientation to person, place, time  . judgment and insight appear intact  I have personally reviewed the following:   Today's Data  . Vitals, CBC, BMP  Imaging  . CT Chest  Scheduled Meds: . apixaban  5 mg Oral BID  . busPIRone  30 mg Oral BID  . Chlorhexidine Gluconate Cloth  6 each Topical Daily  . diltiazem  360 mg Oral Daily  . escitalopram  20 mg Oral Daily  . fluticasone furoate-vilanterol  1 puff Inhalation q morning - 10a   And  . umeclidinium bromide  1 puff Inhalation q morning - 10a  . furosemide  20 mg Oral Daily  . magnesium oxide  200 mg Oral BID  . metoprolol tartrate  50 mg Oral BID  . pantoprazole  40 mg Oral Daily  . pravastatin  20 mg Oral Daily  . rOPINIRole  3 mg Oral QHS  . sodium chloride flush  3 mL Intravenous Q12H   Continuous Infusions: . sodium chloride    . levofloxacin (LEVAQUIN) IV 750 mg (07/10/19 1011)  . vancomycin      Principal Problem:   Postobstructive pneumonia Active Problems:   Dyspnea   Apnea, sleep   Cancer of lower lobe of right lung (HCC)   Atrial flutter with rapid ventricular response (HCC)   LOS: 1 day   A & P  Postobstructive pneumonia: Secondary to progression of neuroendocrine mass in the left upper lobe. The patient is receiving cefepime, levaquin, and vancomycin. Sputum culture as well as urinary antigen for legionella and strep have been ordered. Respiratory pathogen panel is negative.   Acute on chronic hypoxic respiraotry failure: At home the patient is on no oxygen, but she has had progression of shortness of breath over the past 2 months.. She presents with worsening mediastinal, hilar, and supracervical lymph nodes and trace effusion. Concerning for cancer recurrence. Hypoxia resolved with 2 L [place]; unclear to what extent underlying copd contributes to that. Has failed outpt levofloxacin. Didn't improve with steroids prescribed last month. No PE on CTA; troponin wnl x2  and not in pain. No new LE edema or CT findings suggestive of fluid overload. Pulmonology will be consulted as well as oncology. The patient will be continue on her home Trelegy and duonebs will be continued.   A-flutter with rvr: Despite recent adjustment of patient's rate controlling medications, the patient's hr continues to average around 110, ranging 113-130 thsi morning, but has declined to 90 after she has taken her home meds. Hemodynamically stable. Will likely need to hold apixaban for bronchoscopy if pulmonology feels that the patient would benefit from this procedure.  Anxiety: Continue home lexapro, buspar  Hyponatremia: Sodium is improved to 135 from 130 upon admission. Likely due to active pulmonary process. Monitor.  Anemia: LIkely of chronic disease. Hgb 10.6, from low end of normal. Pt denies history of melena, hemotochezia, coffee ground emesis, or hematemesis.   Restless leg syndrome: Continue home requip  I have seen and examined this patient myself. I have spent 38  minutes in her evaluation and care. I have also reviewed history available through [facility].  DVT prophylaxis: eliquis Code Status: dnr, confirmed w/ patient  Family Communication: daughter Cathy Watts, I did not update this evening  Disposition Plan: tbd    Zoria Rawlinson, DO [facility] Direct contact: see [email or web address]  7PM-7AM contact night coverage as above 07/10/2019, 1:26 PM  LOS: 1 day

## 2019-07-11 MED ORDER — MAGIC MOUTHWASH W/LIDOCAINE: 5.0000 mL | Freq: Three times a day (TID) | ORAL | Status: DC | PRN

## 2019-07-11 MED ORDER — MAGIC MOUTHWASH
5.0000 mL | Freq: Three times a day (TID) | ORAL | Status: DC | PRN
  Administered 2019-07-11: 5 mL via ORAL
  Filled 2019-07-11 (×2): qty 10

## 2019-07-11 MED ORDER — LIDOCAINE VISCOUS HCL 2 % MT SOLN
5.0000 mL | Freq: Three times a day (TID) | OROMUCOSAL | Status: DC | PRN
  Administered 2019-07-11: 5 mL via OROMUCOSAL
  Filled 2019-07-11 (×2): qty 15

## 2019-07-11 NOTE — Progress Notes (Signed)
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 She also has atrial fibrillation, COPD exacerbation, OSA without CPAP, AAA, Barrett's esophagus.   The patient presented to ED with complaints of increasing shortness of breath. She states that it has been getting harder to breath for the last 2 months. The patient has recently been evaluated at [facility] for this shortness of breath. They seemed to think that it was due to her atrial fibrillation. Their recommendation was for CCB and beta blockade for heart rate control and initiation of apixaban for stroke prophylaxis. (CHADSVASC2 - 1, HASBLED 1%). Echocardiogram was performed on 07/07/2019. No report is available in "Care Everywhere".   However, CT chest performed in the ED demonstrated a worsening ill-defined left hilar mass causing compression of the left mainstem bronchus as well as the left upper and left lower lobe bronchi. There is worsening postobstructive atelectasis in the superior left lower lobe and left upper lobe. The right perihilar region is essentially stable in appearance from prior study.There are small bilateral pleural effusions, right greater than left. Moderate emphysematous changes are noted bilaterally. There are branching airspace opacities in the left upper lobe concerning for pneumonia. There are few sub solid airspace opacities in the medial right lower lobe which may represent postobstructive atelectasis or pneumonia.  [facility] were consulted to admit the patient for further evaluation and treatment for post obstructive pneumonia. She has been admitted to a telemetry bed. She is  receiving IV Cefepime, Levaquin, and vancomycin. Pulmonology will be consulted as the patient may benefit from bronchoscopy/biopsy. However, the patient is on apixaban so this will likely have to wait until the beginning of the week.   Consultants  . Pulmonology  Procedures  . None  Antibiotics   Anti-infectives (From admission, onward)   Start     Dose/Rate Route Frequency Ordered Stop   07/10/19 1000  levofloxacin (LEVAQUIN) IVPB 750 mg     750 mg 100 mL/hr over 90 Minutes Intravenous Daily 07/10/19 0142     07/09/19 2345  vancomycin (VANCOCIN) IVPB 1000 mg/200 mL premix  Status:  Discontinued     1,000 mg 200 mL/hr over 60 Minutes Intravenous  Once 07/09/19 2342 07/11/19 1119   07/09/19 2230  vancomycin (VANCOCIN) IVPB 1000 mg/200 mL premix     1,000 mg 200 mL/hr over 60 Minutes Intravenous  Once 07/09/19 2219 07/10/19 0037   07/09/19 2230  ceFEPIme (MAXIPIME) 2 g in sodium chloride 0.9 % 100 mL IVPB     2 g 200 mL/hr over 30 Minutes Intravenous  Once 07/09/19 2219 07/09/19 2335     Subjective  The patient is resting comfortably. No new complaints. She states that she is feeling better.  Objective   Vitals:  Vitals:   07/10/19 2307 07/11/19 0704  BP: 104/67 131/89  Pulse: 64 68  Resp: 18   Temp: 98.6 F (37 C) 98.5 F (36.9 C)  SpO2: 96% 98%   Exam:  Constitutional:  . The patient is awake, alert, and oriented x 3. No acute distress. Respiratory:  . No increased work of breathing. . No wheezes, rales, or rhonchi . No tactile fremitus Cardiovascular:  .  Regular rate and rhythm . No murmurs, ectopy, or gallups. . No lateral PMI. No thrills. Abdomen:  . Abdomen is soft, non-tender, non-distended . No hernias, masses, or organomegaly . Normoactive bowel sounds.  Musculoskeletal:  . No cyanosis, clubbing, or edema Skin:  . No rashes, lesions, ulcers . palpation of skin: no induration or nodules Neurologic:  . CN 2-12 intact . Sensation all 4 extremities  intact Psychiatric:  . Mental status o Mood, affect appropriate o Orientation to person, place, time  . judgment and insight appear intact  I have personally reviewed the following:   Today's Data  . Vitals, CBC, BMP  Imaging  . CT Chest  Scheduled Meds: . apixaban  5 mg Oral BID  . busPIRone  30 mg Oral BID  . diltiazem  360 mg Oral Daily  . escitalopram  20 mg Oral Daily  . fluticasone furoate-vilanterol  1 puff Inhalation q morning - 10a   And  . umeclidinium bromide  1 puff Inhalation q morning - 10a  . furosemide  20 mg Oral Daily  . magnesium oxide  200 mg Oral BID  . metoprolol tartrate  50 mg Oral BID  . pantoprazole  40 mg Oral Daily  . pravastatin  20 mg Oral Daily  . rOPINIRole  3 mg Oral QHS  . sodium chloride flush  3 mL Intravenous Q12H   Continuous Infusions: . sodium chloride    . levofloxacin (LEVAQUIN) IV 750 mg (07/11/19 0943)    Principal Problem:   Postobstructive pneumonia Active Problems:   Dyspnea   Apnea, sleep   Cancer of lower lobe of right lung (HCC)   Atrial flutter with rapid ventricular response (HCC)   LOS: 2 days   A & P  Postobstructive pneumonia: Secondary to progression of neuroendocrine mass in the left upper lobe. The patient is receiving cefepime, levaquin, and vancomycin. Sputum culture as well as urinary antigen for legionella and strep have been ordered. Respiratory pathogen panel is negative.   Acute on chronic hypoxic respiraotry failure: Pt is currently 98% on 3L by [place]. At home the patient is on no oxygen, but she has had progression of shortness of breath over the past 2 months. She presents with worsening mediastinal, hilar, and supracervical lymph nodes and trace effusion. Concerning for cancer recurrence.Has failed outpt levofloxacin. Didn't improve with steroids prescribed last month. No PE on CTA; troponin wnl x2 and not in pain. No new LE edema or CT findings suggestive of fluid overload. Pulmonology will be  consulted as well as oncology. The patient will be continue on her home Trelegy and duonebs will be continued.   A-flutter with rvr: Resolved on home meds. Hemodynamically stable. Will likely need to hold apixaban for bronchoscopy if pulmonology feels that the patient would benefit from this procedure.  Anxiety: Continue home lexapro, buspar  Hyponatremia: Sodium is improved to 135 from 130 upon admission. Likely due to active pulmonary process. Monitor.  Anemia: LIkely of chronic disease. Hgb 10.6, from low end of normal. Pt denies history of melena, hemotochezia, coffee ground emesis, or hematemesis.   Restless leg syndrome: Continue home requip  I have seen and examined this patient myself. I have spent 32 minutes in her evaluation and care. I have also reviewed history available through [place].  DVT prophylaxis: eliquis Code Status: dnr, confirmed w/ patient  Family Communication: daughter sherry, I did not update this evening  Disposition Plan: tbd    Tarynn Garling, DO [facility] Direct  contact: see [email or web address]  7PM-7AM contact night coverage as above 07/11/2019, 12:18 PM  LOS: 1 day

## 2019-07-12 ENCOUNTER — Telehealth: Payer: Self-pay | Admitting: Pulmonary Disease

## 2019-07-12 DIAGNOSIS — C7A1 Malignant poorly differentiated neuroendocrine tumors: Secondary | ICD-10-CM

## 2019-07-12 DIAGNOSIS — J189 Pneumonia, unspecified organism: Principal | ICD-10-CM

## 2019-07-12 DIAGNOSIS — J9809 Other diseases of bronchus, not elsewhere classified: Secondary | ICD-10-CM

## 2019-07-12 DIAGNOSIS — J440 Chronic obstructive pulmonary disease with acute lower respiratory infection: Secondary | ICD-10-CM

## 2019-07-12 LAB — CBC
HCT: 32.4 % — ABNORMAL LOW (ref 36.0–46.0)
Hemoglobin: 10.2 g/dL — ABNORMAL LOW (ref 12.0–15.0)
MCH: 28.4 pg (ref 26.0–34.0)
MCHC: 31.5 g/dL (ref 30.0–36.0)
MCV: 90.3 fL (ref 80.0–100.0)
Platelets: 336 10*3/uL (ref 150–400)
RBC: 3.59 MIL/uL — ABNORMAL LOW (ref 3.87–5.11)
RDW: 15.7 % — ABNORMAL HIGH (ref 11.5–15.5)
WBC: 9.2 10*3/uL (ref 4.0–10.5)
nRBC: 0 % (ref 0.0–0.2)

## 2019-07-12 LAB — BASIC METABOLIC PANEL
Anion gap: 5 (ref 5–15)
BUN: 21 mg/dL (ref 8–23)
CO2: 29 mmol/L (ref 22–32)
Calcium: 8.1 mg/dL — ABNORMAL LOW (ref 8.9–10.3)
Chloride: 96 mmol/L — ABNORMAL LOW (ref 98–111)
Creatinine, Ser: 0.72 mg/dL (ref 0.44–1.00)
GFR calc Af Amer: >60 mL/min (ref >60)
GFR calc non Af Amer: >60 mL/min (ref >60)
Glucose, Bld: 119 mg/dL — ABNORMAL HIGH (ref 70–99)
Potassium: 4.8 mmol/L (ref 3.5–5.1)
Sodium: 130 mmol/L — ABNORMAL LOW (ref 135–145)

## 2019-07-12 LAB — LEGIONELLA PNEUMOPHILA SEROGP 1 UR AG: L. pneumophila Serogp 1 Ur Ag: NEGATIVE

## 2019-07-12 MED ORDER — CHLORHEXIDINE GLUCONATE CLOTH 2 % EX PADS
6.0000 | MEDICATED_PAD | Freq: Every day | CUTANEOUS | Status: DC
  Administered 2019-07-13 – 2019-07-14 (×2): 6 via TOPICAL

## 2019-07-12 MED ORDER — ONDANSETRON HCL 4 MG/2ML IJ SOLN
4.0000 mg | Freq: Four times a day (QID) | INTRAMUSCULAR | Status: DC | PRN
  Administered 2019-07-12: 4 mg via INTRAVENOUS
  Filled 2019-07-12: qty 2

## 2019-07-12 NOTE — Consult Note (Signed)
Reason for Consult: Increasing shortness of breath, increasing left hilar mass Referring Physician: Ava Swayze,DO  Cathy Watts is an 74 y.o. female.  HPI: Is a 74 year old recent former smoker patient of Dr. Patricia Pesa who was admitted to [facility] on 2021 due to increased shortness of breath.  She has a complex history and that she has had history of large cell neuroendocrine stage III cancer and COPD with chronic hypoxic respiratory failure.  The patient had prior radiation therapy to the left hilum and underwent concurrent with carboplatin/etoposide finishing in May 2020.  In December 2020 the left hilar consolidation changes were noted and were believed to be secondary to radiation scarring.  There was also right hilar consolidation noted as well as subcentimeter lung nodules which were believed to have been due to inflammation/radiation.  She presented for follow-up to our office on 06 July 2019 and was evaluated by our nurse practitioner time she complained of 3 months history of increasing dyspnea the patient states that it has gotten worse over the last several weeks.  She has also noted significant desaturations with ambulation requiring increasing oxygen supplementation.  She was treated for a COPD exacerbation with Levaquin prior to her follow-up at the office.  She also noted that her sputum was hard to bring up and expectorate.  She has not had any hemoptysis, chest pain nor orthopnea.  She has had some issues with a flutter with RVR is on Eliquis for this.  With [facility] for this she believes this has been controlled of late as she has not had any palpitations.  Since admission to the hospital she states that she feels somewhat better and that she is able to expectorate her sputum better.  She had a CT scan of the chest the day of admission setting of the ill-defined left hilar mass causing compression of the left mainstem bronchus as well as the left upper and left lower lobe bronchi.   She has worsening postobstructive atelectasis in the superior left lower lobe and left upper lobe.  Right perihilar region is stable from prior.  She has small bilateral pleural effusions likely due to volume overload.  She has what appears to be a postobstructive process in the left upper lobe.  I have reviewed the films independently and discussed the recommendations with the patient and her daughter who is present at bedside.  Their recommendations are as noted below.   Aside from dyspnea and difficulty expectorating the patient has had no other symptomatology see review of systems.  Past Medical History:  Diagnosis Date  . Abdominal aortic aneurysm (AAA) 3.0 cm to 5.0 cm in diameter in female [facility]) 05/2018   being followed by dr. Delana Meyer. will reultrasound in 1 year  . Anxiety   . Asthma   . Barrett's esophagus   . COPD (chronic obstructive pulmonary disease) ([place])   . Depression   . GERD (gastroesophageal reflux disease)   . History of peptic ulcer disease   . Hypercholesterolemia   . Low oxygen saturation    2l hs  . Osteopenia   . Panic disorder   . Personal history of radiation therapy   . Personal history of tobacco use, presenting hazards to health 01/16/2015  . Requires supplemental oxygen 05/2018   supposed to use 2L NP at night but she currently does not.  encouraged patient to resume this  . Sleep apnea    snores significantly but has never been tested for OSA  . SOB (shortness  of breath)   . Squamous cell carcinoma of right lung ([place]) 2015    Past Surgical History:  Procedure Laterality Date  . CERVICAL CONE BIOPSY  1990  . CHOLECYSTECTOMY  1980's  . colonoscopy  2014  . COLONOSCOPY  07/21/2012  . COLONOSCOPY WITH PROPOFOL N/A 05/12/2018   Procedure: COLONOSCOPY WITH PROPOFOL;  Surgeon: Lollie Sails, MD;  Location: [facility] ENDOSCOPY;  Service: Endoscopy;  Laterality: N/A;  . ELBOW FRACTURE SURGERY Right 2009  . ENDOBRONCHIAL ULTRASOUND N/A 02/07/2015    Procedure: ENDOBRONCHIAL ULTRASOUND;  Surgeon: Flora Lipps, MD;  Location: [facility] ORS;  Service: Cardiopulmonary;  Laterality: N/A;  . ENDOBRONCHIAL ULTRASOUND Left 06/05/2018   Procedure: ENDOBRONCHIAL ULTRASOUND;  Surgeon: Flora Lipps, MD;  Location: [facility] ORS;  Service: Cardiopulmonary;  Laterality: Left;  . ESOPHAGOGASTRODUODENOSCOPY (EGD) WITH PROPOFOL N/A 02/26/2016   Procedure: ESOPHAGOGASTRODUODENOSCOPY (EGD) WITH PROPOFOL;  Surgeon: Lollie Sails, MD;  Location: [facility] ENDOSCOPY;  Service: Endoscopy;  Laterality: N/A;  COPD, Sleep Apnea  . ESOPHAGOGASTRODUODENOSCOPY (EGD) WITH PROPOFOL N/A 05/12/2018   Procedure: ESOPHAGOGASTRODUODENOSCOPY (EGD) WITH PROPOFOL;  Surgeon: Lollie Sails, MD;  Location: [facility] ENDOSCOPY;  Service: Endoscopy;  Laterality: N/A;  . EYE SURGERY Bilateral    cataract extractions  . FRACTURE SURGERY  2005   elbow repair  . PORTA CATH INSERTION N/A 06/17/2018   Procedure: PORTA CATH INSERTION;  Surgeon: Katha Cabal, MD;  Location: [place] CV LAB;  Service: Cardiovascular;  Laterality: N/A;  . TONSILLECTOMY  1979  . TUBAL LIGATION  1970's  . UPPER GI ENDOSCOPY      Family History  Problem Relation Age of Onset  . Colon cancer Mother        colon  . Diabetes Mother   . Arthritis Mother   . Glaucoma Mother   . Stomach cancer Father        stomach  . Throat cancer Brother        throat  . Breast cancer Sister        breast  . Diabetes Sister   . Cirrhosis Sister   . Cancer Paternal Aunt   . Parkinson's disease Sister   . Heart attack Sister   . Ovarian cancer Sister     Social History:  reports that she quit smoking about 20 months ago. Her smoking use included cigarettes. She has a 55.00 pack-year smoking history. She has never used smokeless tobacco. She reports current alcohol use. She reports that she does not use drugs.  Allergies:  Allergies  Allergen Reactions  . Amoxicillin Hives and Itching    Did it involve swelling of the  face/tongue/throat, SOB, or low BP? No Did it involve sudden or severe rash/hives, skin peeling, or any reaction on the inside of your mouth or nose? No Did you need to seek medical attention at a hospital or doctor's office? No When did it last happen?2018 If all above answers are "NO", may proceed with cephalosporin use.       Medications:  I have reviewed the patient's current medications. Prior to Admission:  Medications Prior to Admission  Medication Sig Dispense Refill Last Dose  . apixaban (ELIQUIS) 5 MG TABS tablet Take 1 tablet (5 mg total) by mouth 2 (two) times daily. 60 tablet 1 07/09/2019 at 0900  . busPIRone (BUSPAR) 30 MG tablet Take 1 tablet by mouth twice daily (Patient taking differently: Take 30 mg by mouth 2 (two) times daily. ) 180 tablet 1 07/09/2019 at 0900  . diltiazem (CARDIZEM  CD) 240 MG 24 hr capsule Take 1 capsule (240 mg total) by mouth daily. 30 capsule 1 07/09/2019 at 0900  . escitalopram (LEXAPRO) 20 MG tablet Take 1 tablet by mouth once daily (Patient taking differently: Take 20 mg by mouth daily. ) 90 tablet 1 07/09/2019 at Unknown time  . Fluticasone-Umeclidin-Vilant (TRELEGY ELLIPTA) 100-62.5-25 MCG/INH AEPB Inhale 1 puff into the lungs daily. 180 each 3 07/09/2019 at Unknown time  . furosemide (LASIX) 20 MG tablet Take 1 tablet (20 mg total) by mouth daily. 90 tablet 1 07/09/2019 at Unknown time  . ipratropium (ATROVENT HFA) 17 MCG/ACT inhaler Inhale 2 puffs into the lungs 4 (four) times daily. 1 Inhaler 2 07/09/2019 at Unknown time  . ipratropium-albuterol (DUONEB) 0.5-2.5 (3) MG/3ML SOLN Take 3 mLs by nebulization every 6 (six) hours as needed. (Patient taking differently: Take 3 mLs by nebulization every 6 (six) hours as needed (shortness of breath). ) 360 mL 5 prn at prn  . levofloxacin (LEVAQUIN) 500 MG tablet Take 1 tablet (500 mg total) by mouth daily. 10 tablet 0 07/09/2019 at Unknown time  . Magnesium 250 MG TABS Take 250 mg by mouth 2 (two) times daily.     07/09/2019 at 0900  . metoprolol tartrate (LOPRESSOR) 50 MG tablet Take 50 mg by mouth 2 (two) times daily.   07/09/2019 at 0900  . omeprazole (PRILOSEC) 40 MG capsule Take 1 capsule by mouth once daily (Patient taking differently: Take 40 mg by mouth daily. ) 90 capsule 0 07/09/2019 at Unknown time  . pravastatin (PRAVACHOL) 20 MG tablet TAKE 1 TABLET BY MOUTH AT BEDTIME (Patient taking differently: Take 20 mg by mouth daily. ) 90 tablet 1 07/08/2019 at 2100  . PROAIR HFA 108 (90 Base) MCG/ACT inhaler INHALE 2 PUFFS BY MOUTH EVERY 4 HOURS AS NEEDED FOR WHEEZING FOR SHORTNESS OF BREATH (Patient taking differently: Inhale 2 puffs into the lungs every 4 (four) hours as needed for wheezing or shortness of breath. ) 9 g 0 prn at prn  . rOPINIRole (REQUIP) 3 MG tablet TAKE 1 TABLET BY MOUTH AT BEDTIME (Patient taking differently: Take 3 mg by mouth at bedtime. ) 90 tablet 1 07/08/2019 at 2100  . sucralfate (CARAFATE) 1 g tablet Take 1 g by mouth 3 (three) times daily. DISSOLVE IN THREE TO FOUR TABLESPOONFULS OF WARM WARM SWISH AND SWALLOW   07/09/2019 at Unknown time  . guaiFENesin-dextromethorphan (ROBITUSSIN DM) 100-10 MG/5ML syrup Take 5 mLs by mouth every 4 (four) hours as needed for cough. (Patient not taking: Reported on 07/09/2019) 118 mL 0 Completed Course at Unknown time  . nystatin (MYCOSTATIN) 100000 UNIT/ML suspension Take 5 mLs (500,000 Units total) by mouth 4 (four) times daily. (Patient not taking: Reported on 07/09/2019) 240 mL 0 Completed Course at Unknown time  . polyethylene glycol powder (GLYCOLAX/MIRALAX) powder TAKE 17 GRAMS OF POWDER MIXED IN 8 OUNCES OF WATER BY MOUTH ONCE A DAY. (Patient not taking: Reported on 07/09/2019) 255 g 3 Not Taking at Unknown time  . potassium chloride SA (K-DUR) 20 MEQ tablet Take 1 tablet (20 mEq total) by mouth daily. (Patient not taking: Reported on 07/09/2019) 30 tablet 3 Not Taking at Unknown time  . Respiratory Therapy Supplies (FLUTTER) DEVI 1 each by Does not apply  route QID. 1 each 0     Results for orders placed or performed during the hospital encounter of 07/09/19 (from the past 48 hour(s))  CBC     Status: Abnormal   Collection Time:  07/12/19  4:14 AM  Result Value Ref Range   WBC 9.2 4.0 - 10.5 K/uL   RBC 3.59 (L) 3.87 - 5.11 MIL/uL   Hemoglobin 10.2 (L) 12.0 - 15.0 g/dL   HCT 32.4 (L) 36.0 - 46.0 %   MCV 90.3 80.0 - 100.0 fL   MCH 28.4 26.0 - 34.0 pg   MCHC 31.5 30.0 - 36.0 g/dL   RDW 15.7 (H) 11.5 - 15.5 %   Platelets 336 150 - 400 K/uL   nRBC 0.0 0.0 - 0.2 %    Comment: Performed at [facility], [address] [place], [place] [address]  Basic metabolic panel     Status: Abnormal   Collection Time: 07/12/19  4:14 AM  Result Value Ref Range   Sodium 130 (L) 135 - 145 mmol/L   Potassium 4.8 3.5 - 5.1 mmol/L   Chloride 96 (L) 98 - 111 mmol/L   CO2 29 22 - 32 mmol/L   Glucose, Bld 119 (H) 70 - 99 mg/dL    Comment: Glucose reference range applies only to samples taken after fasting for at least 8 hours.   BUN 21 8 - 23 mg/dL   Creatinine, Ser 0.72 0.44 - 1.00 mg/dL   Calcium 8.1 (L) 8.9 - 10.3 mg/dL   GFR calc non Af Amer >60 >60 mL/min   GFR calc Af Amer >60 >60 mL/min   Anion gap 5 5 - 15    Comment: Performed at [facility], [address] [place], [place] [address]    No results found.  Review of Systems  Constitutional: Positive for fatigue.  HENT: Negative.   Eyes: Negative.   Respiratory: Positive for cough, shortness of breath and wheezing.   Gastrointestinal: Negative.   Neurological: Negative.   Hematological: Negative.   All other systems reviewed and are negative.  Blood pressure 110/68, pulse 69, temperature 98.3 F (36.8 C), temperature source Oral, resp. rate 18, height 5\' 10"  (1.778 m), weight 111.9 kg, SpO2 95 %. Physical Exam GENERAL: Obese woman, lying in bed, comfortable with nasal cannula.  Very mild conversational dyspnea.  No overt distress. HEAD: Normocephalic, atraumatic.   EYES: Pupils equal, round, reactive to light.  No scleral icterus.  MOUTH: Oral mucosa moist. NECK: Supple. No thyromegaly. No nodules. No JVD.  Trachea midline PULMONARY: Good air entry on the right lung with coarse breath sounds no other adventitious sounds.  Diminished breath sounds on the left with some localized mid lung field monophonic wheeze. CARDIOVASCULAR: S1 and S2. Regular rate and rhythm.  No rubs murmurs or gallops appreciated GASTROINTESTINAL: Abdomen, soft, nondistended MUSCULOSKELETAL: No joint deformity, no clubbing, 1+ edema lower extremities, pitting. NEUROLOGIC: Awake alert, no overt focal deficit, speech is fluent. SKIN: Intact,warm,dry.  Limited examination shows no rashes. PSYCH: Mood and behavior are appropriate \  Assessment/Plan:  Dyspnea/shortness of breath Compression of the left mainstem bronchus Increasing left hilar mass  After reviewing the films and examining the patient recommendations are as follows:  Patient should undergo bronchoscopy under general anesthesia  Biopsy for evaluation of potential recurrence versus radiation-induced scarring  Biopsy may require EBUS with TBNA  Cryotherapy, balloon dilation and endobronchial stent placement in the left mainstem bronchus  Tumor debulking if/as necessary  Benefits, limitations and potential complications of the procedure were discussed with the patient/family  including, but not limited to bleeding, hemoptysis, respiratory failure requiring intubation and/or prolongued mechanical ventilation, infection, pneumothorax (collapse of lung) requiring chest tube placement, stroke from air embolism  or even death  Patient understands she should be off of anticoagulants of any kind at least 5 days prior to procedure  Scheduled for 19 March at 1 PM  Postobstructive pneumonia COPD without overt exacerbation Switching the patient to nebulization treatments with DuoNeb 4 times a day Pulmicort twice a day Via  neb Not sure if the patient has the breath-holding capacity to get relief from [facility]   Thank you for allowing [facility] to participate in this patient's care.   Renold Don, MD  PCCM 07/12/2019, 5:12 PM    *This note was dictated using voice recognition software/Dragon.  Despite best efforts to proofread, errors can occur which can change the meaning.  Any change was purely unintentional.

## 2019-07-12 NOTE — Telephone Encounter (Signed)
Phone pre admit visit has been scheduled for 07/15/2019 between 8-1 with covid test on 07/21/2019. Pt is currently admitted. Will call pt with date/time of covid test and pre admit once discharged.

## 2019-07-12 NOTE — TOC Initial Note (Signed)
Transition of Care [facility]) - Initial/Assessment Note    Patient Details  Name: Cathy Watts MRN: [id number] Date of Birth: [birth year]  Transition [facility] Care [facility]) CM/SW Contact:    Elease Hashimoto, LCSW Phone Number: 07/12/2019, 1:56 PM  Clinical Narrative:   Met with pt and daughter was at the bedside to obtain information. Pt is very independent and lives with her daughter and daughter's fiance, daughter is not currently employed and can assist. The daughter at bedside lives down the street and can assist also. Pt has home O2 but did not need to use this prior to admission.  She has no other pieces  of equipment. She recently finished Chemo three months ago and feels it is back now. Will work on any discharge needs and provide support to pt. Pt states: " I will do what is needed, doesn't mean I don't like it but will do it." Continue to follow        Expected Discharge Plan: Home/Self Care Barriers to Discharge: Continued Medical Work up   Patient Goals and CMS Choice Patient states their goals for this hospitalization and ongoing recovery are:: I hope to be able to breath better and then go home      Expected Discharge Plan and Services Expected Discharge Plan: Home/Self Care In-house Referral: Clinical Social Work     Living arrangements for the past 2 months: Single Family Home                                      Prior Living Arrangements/Services Living arrangements for the past 2 months: Single Family Home Lives with:: Adult Children, Other (Comment)(daughter fiance)          Need for Family Participation in Patient Care: Yes (Comment) Care giver support system in place?: Yes (comment) Current home services: Other (comment)(home O2)    Activities of Daily Living Home Assistive Devices/Equipment: None ADL Screening (condition at time of admission) Patient's cognitive ability adequate to safely complete daily activities?: Yes Is the patient deaf or have  difficulty hearing?: No Does the patient have difficulty seeing, even when wearing glasses/contacts?: No Does the patient have difficulty concentrating, remembering, or making decisions?: No Patient able to express need for assistance with ADLs?: Yes Does the patient have difficulty dressing or bathing?: No Independently performs ADLs?: Yes (appropriate for developmental age) Does the patient have difficulty walking or climbing stairs?: No Weakness of Legs: None Weakness of Arms/Hands: None  Permission Sought/Granted   Permission granted to share information with : Yes, Verbal Permission Granted  Share Information with NAME: Daughters     Permission granted to share info w Relationship: daughter     Emotional Assessment Appearance:: Appears stated age Attitude/Demeanor/Rapport: Engaged, Gracious Affect (typically observed): Adaptable, Accepting Orientation: : Oriented to Self, Oriented to Place, Oriented to  Time, Oriented to Situation      Admission diagnosis:  Postobstructive pneumonia [J18.9] Patient Active Problem List   Diagnosis Date Noted  . Postobstructive pneumonia 07/09/2019  . Atrial flutter with rapid ventricular response ([place]) 06/17/2019  . Left leg swelling 05/14/2018  . Elevated hemoglobin A1c 05/02/2018  . Abdominal aortic aneurysm (AAA) 3.0 cm to 5.0 cm in diameter in female ([place]) 06/30/2017  . Abdominal aortic atherosclerosis ([place]) 06/30/2017  . Chronic venous insufficiency 04/11/2016  . Varicose veins of bilateral lower extremities with pain 03/12/2016  . Cancer of lower lobe of right  lung ([place]) 10/09/2015  . Oropharyngeal candidiasis 07/12/2015  . Malignant neoplasm of hilus of left lung ([place])   . Personal history of tobacco use, presenting hazards to health 01/16/2015  . Airway hyperreactivity 11/15/2014  . CN (constipation) 11/15/2014  . COPD with acute exacerbation ([place]) 11/15/2014  . Daytime somnolence 11/15/2014  . Clinical depression 11/15/2014   . DD (diverticular disease) 11/15/2014  . Accumulation of fluid in tissues 11/15/2014  . H/O peptic ulcer 11/15/2014  . Osteopenia 11/15/2014  . Awareness of heartbeats 11/15/2014  . Episodic paroxysmal anxiety disorder 11/15/2014  . Apnea, sleep 11/15/2014  . Snores 11/15/2014  . Breath shortness 11/15/2014  . Compulsive tobacco user syndrome 11/15/2014  . Avitaminosis D 11/15/2014  . Barrett esophagus 10/19/2014  . Acid reflux 10/19/2014  . COPD, moderate ([place]) 08/31/2013  . Dyspnea 08/31/2013  . Otitis, externa, infective 08/31/2013  . Nocturnal hypoxemia 08/31/2013  . Hypercholesterolemia without hypertriglyceridemia 11/05/2005   PCP:  Mar Daring, PA-C Pharmacy:   [facility] [address], [place] - [place] [address] [place] [address] Phone: [phone] Fax: [phone]     Social Determinants of Health (SDOH) Interventions    Readmission Risk Interventions No flowsheet data found.

## 2019-07-12 NOTE — Care Management Important Message (Signed)
Important Message  Patient Details  Name: NETTA FODGE MRN: [id number] Date of Birth: 07/27/1945   Medicare Important Message Given:  Yes     Juliann Pulse A Thamara Leger 07/12/2019, 11:52 AM

## 2019-07-12 NOTE — Telephone Encounter (Signed)
07/12/2019 at 2:50 pm EST spoke with Dub Mikes at [facility]. Procedures codes [phone], [id number], [id number] and [phone] are V/B codes which does not require PA. Call Ref # [id number]. Rhonda J Cobb

## 2019-07-12 NOTE — Progress Notes (Signed)
D: Pt alert and oriented x 4. Pt denies experiencing any pain at this time. Pt does report experiencing constipation this evening, information passed on in report. Physician to be contacted.   A: Scheduled medications administered to pt, per MD orders. Support and encouragement provided. Frequent verbal contact made.   R: No adverse drug reactions noted. Pt complaint with medications and treatment plan. Pt interacts well with staff on the unit. Pt is stable at this time, Will continue to monitor and provide care for as ordered.

## 2019-07-12 NOTE — Progress Notes (Addendum)
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 She also has atrial fibrillation, COPD exacerbation, OSA without CPAP, AAA, Barrett's esophagus.   The patient presented to ED with complaints of increasing shortness of breath. She states that it has been getting harder to breath for the last 2 months. The patient has recently been evaluated at [facility] for this shortness of breath. They seemed to think that it was due to her atrial fibrillation. Their recommendation was for CCB and beta blockade for heart rate control and initiation of apixaban for stroke prophylaxis. (CHADSVASC2 - 1, HASBLED 1%). Echocardiogram was performed on 07/07/2019. No report is available in "Care Everywhere".   However, CT chest performed in the ED demonstrated a worsening ill-defined left hilar mass causing compression of the left mainstem bronchus as well as the left upper and left lower lobe bronchi. There is worsening postobstructive atelectasis in the superior left lower lobe and left upper lobe. The right perihilar region is essentially stable in appearance from prior study.There are small bilateral pleural effusions, right greater than left. Moderate emphysematous changes are noted bilaterally. There are branching airspace opacities in the left upper lobe concerning for pneumonia. There are few sub solid airspace opacities in the medial right lower lobe which may represent postobstructive atelectasis or pneumonia.  [facility] were consulted to admit the patient for further evaluation and treatment for post obstructive pneumonia. She has been admitted to a telemetry bed. She is  receiving IV Cefepime, Levaquin, and vancomycin. Dr. Mortimer Fries for pulmonology was contacted this morning and directed by to contact Dr. Derrill Kay for the consult. She plans to take the patient for bronchoscopy with cryo and stenting on Friday. She states that this can be done as outpatient.  Consultants  . Pulmonology  Procedures  . None  Antibiotics   Anti-infectives (From admission, onward)   Start     Dose/Rate Route Frequency Ordered Stop   07/10/19 1000  levofloxacin (LEVAQUIN) IVPB 750 mg     750 mg 100 mL/hr over 90 Minutes Intravenous Daily 07/10/19 0142     07/09/19 2345  vancomycin (VANCOCIN) IVPB 1000 mg/200 mL premix  Status:  Discontinued     1,000 mg 200 mL/hr over 60 Minutes Intravenous  Once 07/09/19 2342 07/11/19 1119   07/09/19 2230  vancomycin (VANCOCIN) IVPB 1000 mg/200 mL premix     1,000 mg 200 mL/hr over 60 Minutes Intravenous  Once 07/09/19 2219 07/10/19 0037   07/09/19 2230  ceFEPIme (MAXIPIME) 2 g in sodium chloride 0.9 % 100 mL IVPB     2 g 200 mL/hr over 30 Minutes Intravenous  Once 07/09/19 2219 07/09/19 2335     Subjective  The patient is resting comfortably. No new complaints.  Objective   Vitals:  Vitals:   07/12/19 0729 07/12/19 0935  BP: 105/80 120/63  Pulse: 92 96  Resp: 17   Temp: 98 F (36.7 C)   SpO2: 95% 96%   Exam:  Constitutional:  . The patient is awake, alert, and oriented x 3. No acute distress. Respiratory:  . No increased work of breathing. . No wheezes, rales, or rhonchi . No tactile fremitus Cardiovascular:  .  Regular rate and rhythm . No murmurs, ectopy, or gallups. . No lateral PMI. No thrills. Abdomen:  . Abdomen is soft, non-tender, non-distended . No hernias, masses, or organomegaly . Normoactive bowel sounds.  Musculoskeletal:  . No cyanosis, clubbing, or edema Skin:  . No rashes, lesions, ulcers . palpation of skin: no induration or nodules Neurologic:  . CN 2-12 intact . Sensation all 4  extremities intact Psychiatric:  . Mental status o Mood, affect appropriate o Orientation to person, place, time  . judgment and insight appear intact  I have personally reviewed the following:   Today's Data  . Vitals, CBC, BMP  Imaging  . CT Chest  Scheduled Meds: . apixaban  5 mg Oral BID  . busPIRone  30 mg Oral BID  . diltiazem  360 mg Oral Daily  . escitalopram  20 mg Oral Daily  . fluticasone furoate-vilanterol  1 puff Inhalation q morning - 10a   And  . umeclidinium bromide  1 puff Inhalation q morning - 10a  . furosemide  20 mg Oral Daily  . magnesium oxide  200 mg Oral BID  . metoprolol tartrate  50 mg Oral BID  . pantoprazole  40 mg Oral Daily  . pravastatin  20 mg Oral Daily  . rOPINIRole  3 mg Oral QHS  . sodium chloride flush  3 mL Intravenous Q12H   Continuous Infusions: . sodium chloride    . levofloxacin (LEVAQUIN) IV 750 mg (07/12/19 0947)    Principal Problem:   Postobstructive pneumonia Active Problems:   Dyspnea   Apnea, sleep   Cancer of lower lobe of right lung (HCC)   Atrial flutter with rapid ventricular response (HCC)   LOS: 3 days   A & P  Postobstructive pneumonia: Secondary to progression of neuroendocrine mass in the left upper lobe. The patient is receiving cefepime, levaquin, and vancomycin. Sputum culture as well as urinary antigen for legionella and strep have been ordered. Respiratory pathogen panel is negative. I will transition the patient to oral antibiotics. If she does well she may be discharged to home on O2 with follow up with Dr. Vernard Gambles on Friday for bronchospy and stenting of her airway. Oncology has also been consulted.  Acute on chronic hypoxic respiraotry failure: Pt is currently 98% on 3L by [place]. At home the patient is on no oxygen, but she has had progression of shortness of breath over the past 2 months. She presents with worsening mediastinal, hilar, and supracervical lymph nodes and trace effusion. Concerning  for cancer recurrence.Has failed outpt levofloxacin. Didn't improve with steroids prescribed last month. No PE on CTA; troponin wnl x2 and not in pain. No new LE edema or CT findings suggestive of fluid overload. The patient will be continue on her home Trelegy and duonebs will be continued. I will transition the patient to oral antibiotics. If she does well she may be discharged to home on O2 with follow up with Dr. Vernard Gambles on Friday for bronchospy and stenting of her airway.  Bilateral pulmonary masses.   A-flutter with rvr: Resolved on home meds. Hemodynamically stable. Will likely need to hold apixaban for bronchoscopy if pulmonology feels that the patient would benefit from this procedure.  Anxiety: Continue home lexapro, buspar  Hyponatremia: Sodium is improved to 135 from 130 upon admission. Likely due to active pulmonary process. Monitor.  Anemia: LIkely of chronic disease. Hgb 10.6, from low end of normal. Pt denies history of melena, hemotochezia, coffee ground emesis,  or hematemesis.   Restless leg syndrome: Continue home requip  I have seen and examined this patient myself. I have spent 32 minutes in her evaluation and care. I have also reviewed history available through [place].  DVT prophylaxis: eliquis Code Status: dnr, confirmed w/ patient  Family Communication: daughter sherry was listening to my visit over the phone. Disposition Plan: tbd    Angelyn Osterberg, DO [facility] Direct contact: see [email or web address]  7PM-7AM contact night coverage as above 07/12/2019, 12:18 PM  LOS: 1 day

## 2019-07-12 NOTE — Telephone Encounter (Addendum)
Pt has been scheduled for regular bronch on 07/23/2019 at 1:00p. Bronch will include fluoro, cryo therapy and stents/balloons.  Dx: left hilar mass CPT: 16945, [id number], [id number], [id number]  Suanne Marker, please see bronch info.

## 2019-07-13 ENCOUNTER — Encounter: Payer: Self-pay | Admitting: Physician Assistant

## 2019-07-13 MED ORDER — POLYETHYLENE GLYCOL 3350 17 G PO PACK
17.0000 g | PACK | Freq: Every day | ORAL | Status: DC | PRN
  Administered 2019-07-13 – 2019-07-14 (×3): 17 g via ORAL
  Filled 2019-07-13 (×3): qty 1

## 2019-07-13 MED ORDER — LEVOFLOXACIN 500 MG PO TABS
750.0000 mg | ORAL_TABLET | Freq: Every day | ORAL | Status: DC
  Administered 2019-07-14 – 2019-07-15 (×2): 750 mg via ORAL
  Filled 2019-07-13 (×2): qty 2

## 2019-07-13 NOTE — Telephone Encounter (Signed)
Spoke to pt and made her aware of date/time for pre admit testing, as it is in 2 days.  Due to pt currently being admitted I will call pt back once she is discharged to give further instructions and date/time of covid test.

## 2019-07-13 NOTE — Progress Notes (Signed)
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 She also has atrial fibrillation, COPD exacerbation, OSA without CPAP, AAA, Barrett's esophagus.   The patient presented to ED with complaints of increasing shortness of breath. She states that it has been getting harder to breath for the last 2 months. The patient has recently been evaluated at [facility] for this shortness of breath. They seemed to think that it was due to her atrial fibrillation. Their recommendation was for CCB and beta blockade for heart rate control and initiation of apixaban for stroke prophylaxis. (CHADSVASC2 - 1, HASBLED 1%). Echocardiogram was performed on 07/07/2019. No report is available in "Care Everywhere".   However, CT chest performed in the ED demonstrated a worsening ill-defined left hilar mass causing compression of the left mainstem bronchus as well as the left upper and left lower lobe bronchi. There is worsening postobstructive atelectasis in the superior left lower lobe and left upper lobe. The right perihilar region is essentially stable in appearance from prior study.There are small bilateral pleural effusions, right greater than left. Moderate emphysematous changes are noted bilaterally. There are branching airspace opacities in the left upper lobe concerning for pneumonia. There are few sub solid airspace opacities in the medial right lower lobe which may represent postobstructive atelectasis or pneumonia.  [facility] were consulted to admit the patient for further evaluation and treatment for post obstructive pneumonia. She has been admitted to a telemetry bed. She is  receiving IV Cefepime, Levaquin, and vancomycin. Dr. Mortimer Fries for pulmonology was contacted this morning and directed by to contact Dr. Derrill Kay for the consult. She plans to take the patient for bronchoscopy with cryo and stenting on Friday. She states that this can be done as outpatient.  The patient has been converted to oral antibiotics.  Consultants  . Pulmonology  Procedures  . None  Antibiotics   Anti-infectives (From admission, onward)   Start     Dose/Rate Route Frequency Ordered Stop   07/13/19 1000  levofloxacin (LEVAQUIN) tablet 750 mg     750 mg Oral Daily 07/13/19 0952     07/10/19 1000  levofloxacin (LEVAQUIN) IVPB 750 mg  Status:  Discontinued     750 mg 100 mL/hr over 90 Minutes Intravenous Daily 07/10/19 0142 07/13/19 0952   07/09/19 2345  vancomycin (VANCOCIN) IVPB 1000 mg/200 mL premix  Status:  Discontinued     1,000 mg 200 mL/hr over 60 Minutes Intravenous  Once 07/09/19 2342 07/11/19 1119   07/09/19 2230  vancomycin (VANCOCIN) IVPB 1000 mg/200 mL premix     1,000 mg 200 mL/hr over 60 Minutes Intravenous  Once 07/09/19 2219 07/10/19 0037   07/09/19 2230  ceFEPIme (MAXIPIME) 2 g in sodium chloride 0.9 % 100 mL IVPB     2 g 200 mL/hr over 30 Minutes Intravenous  Once 07/09/19 2219 07/09/19 2335     Subjective  The patient is resting comfortably. No new complaints. She is very anxious about discharge and states that she fears that "she won't make it" if she goes home today.  Objective   Vitals:  Vitals:   07/12/19 2329 07/13/19 0749  BP: 122/74 105/76  Pulse:  88 86  Resp: 16 17  Temp: (!) 97.4 F (36.3 C) 97.9 F (36.6 C)  SpO2: 96% 97%   Exam:  Constitutional:  . The patient is awake, alert, and oriented x 3. No acute distress. Respiratory:  . No increased work of breathing. . No wheezes, rales, or rhonchi . No tactile fremitus Cardiovascular:  . Regular rate and rhythm . No murmurs, ectopy, or gallups. . No lateral PMI. No  thrills. Abdomen:  . Abdomen is soft, non-tender, non-distended . No hernias, masses, or organomegaly . Normoactive bowel sounds.  Musculoskeletal:  . No cyanosis, clubbing, or edema Skin:  . No rashes, lesions, ulcers . palpation of skin: no induration or nodules Neurologic:  . CN 2-12 intact . Sensation all 4 extremities intact Psychiatric:  . Mental status o Mood, affect appropriate o Orientation to person, place, time  . judgment and insight appear intact  I have personally reviewed the following:   Today's Data  . Vitals  Imaging  . CT Chest  Scheduled Meds: . busPIRone  30 mg Oral BID  . Chlorhexidine Gluconate Cloth  6 each Topical Daily  . diltiazem  360 mg Oral Daily  . escitalopram  20 mg Oral Daily  . fluticasone furoate-vilanterol  1 puff Inhalation q morning - 10a   And  . umeclidinium bromide  1 puff Inhalation q morning - 10a  . furosemide  20 mg Oral Daily  . levofloxacin  750 mg Oral Daily  . magnesium oxide  200 mg Oral BID  . metoprolol tartrate  50 mg Oral BID  . pantoprazole  40 mg Oral Daily  . pravastatin  20 mg Oral Daily  . rOPINIRole  3 mg Oral QHS  . sodium chloride flush  3 mL Intravenous Q12H   Continuous Infusions: . sodium chloride      Principal Problem:   Postobstructive pneumonia Active Problems:   Dyspnea   Apnea, sleep   Cancer of lower lobe of right lung (HCC)   Atrial flutter with rapid ventricular response (HCC)   LOS: 4 days   A & P  Postobstructive pneumonia: Secondary to progression of neuroendocrine mass in the left upper lobe. The patient is receiving cefepime, levaquin, and vancomycin. Sputum culture as well as urinary antigen for legionella and strep have been ordered. Respiratory pathogen panel is negative. I will transition the patient to oral antibiotics. If she does well she may be discharged to home on O2 with follow up with Dr. Vernard Gambles on Friday for bronchospy and stenting of her airway as  outpatient. She has been converted to oral antibiotics.  Acute on chronic hypoxic respiraotry failure: Pt is currently 98% on 3L by [place]. At home the patient is on no oxygen, but she has had progression of shortness of breath over the past 2 months. She presents with worsening mediastinal, hilar, and supracervical lymph nodes and trace effusion. Concerning for cancer recurrence.Has failed outpt levofloxacin. Didn't improve with steroids prescribed last month. No PE on CTA; troponin wnl x2 and not in pain. No new LE edema or CT findings suggestive of fluid overload. The patient will be continue on her home Trelegy and duonebs will be continued. I will transition the patient to oral antibiotics. If she does well she may be discharged to home on O2 with follow up with Dr. Vernard Gambles on Friday for bronchospy and stenting of her airway. This will happen as outpatient.  Bilateral pulmonary masses: The patient will follow up with oncology  as outpatient.  A-flutter with rvr: Resolved on home meds. Hemodynamically stable. Will likely need to hold apixaban for bronchoscopy if pulmonology feels that the patient would benefit from this procedure.  Anxiety: Continue home lexapro, buspar  Hyponatremia: Mild, chronic and likely due to active pulmonary process. Monitor.  Anemia: LIkely of chronic disease. Hgb 10.6, from low end of normal. Pt denies history of melena, hemotochezia, coffee ground emesis, or hematemesis.   Restless leg syndrome: Continue home requip  I have seen and examined this patient myself. I have spent 35 minutes in her evaluation and care.  DVT prophylaxis: eliquis Code Status: dnr, confirmed w/ patient  Family Communication: daughter sherry was listening to my visit over the phone. Disposition Plan: Patient is from home and will be discharged to home on Oxygen which she used on an as needed basis prior to admission. Discussed with the patient possibility of discharge today. She  expressed fear over this plan as she felt that she "wound not make it" if she went home today. She has been converted to oral antibiotics. I will encourage ambulation and plan on discharge  tomorrow.  Kannon Granderson, DO [facility] Direct contact: see [email or web address]  7PM-7AM contact night coverage as above 07/12/2019, 12:18 PM  LOS: 1 day

## 2019-07-13 NOTE — TOC Progression Note (Signed)
Transition of Care [facility]) - Progression Note    Patient Details  Name: Cathy Watts MRN: [id number] Date of Birth: 1945/06/12  [facility]) CM/SW Contact  Reagyn Facemire, Gardiner Rhyme, LCSW Phone Number: 07/13/2019, 2:20 PM  Clinical Narrative:   Met with pt to discuss discharge needs. She does not have a portable O2 tank at home due to o ly needed concentrator. She reports it is through [employer]. Have asked [employer] for portable tank to be delivered to room by tomorrow. Pt also to see if has a rw and bsc from late husband in attic, she will have daughter check on this tonight and will see in am prior to discharge if need to order any of this. See in am to confirm discharge and see if more equipment needs.    Expected Discharge Plan: Home/Self Care Barriers to Discharge: Continued Medical Work up  Expected Discharge Plan and Services Expected Discharge Plan: Home/Self Care In-house Referral: Clinical Social Work     Living arrangements for the past 2 months: Single Family Home                                       Social Determinants of Health (SDOH) Interventions    Readmission Risk Interventions No flowsheet data found.

## 2019-07-14 ENCOUNTER — Inpatient Hospital Stay: Payer: Medicare Other

## 2019-07-14 LAB — CULTURE, BLOOD (ROUTINE X 2)
Culture: NO GROWTH
Culture: NO GROWTH
Special Requests: ADEQUATE
Special Requests: ADEQUATE

## 2019-07-14 MED ORDER — GADOBUTROL 1 MMOL/ML IV SOLN
10.0000 mL | Freq: Once | INTRAVENOUS | Status: AC | PRN
  Administered 2019-07-14: 10 mL via INTRAVENOUS

## 2019-07-14 MED ORDER — BENZONATATE 100 MG PO CAPS
200.0000 mg | ORAL_CAPSULE | Freq: Three times a day (TID) | ORAL | Status: DC | PRN
  Administered 2019-07-14 – 2019-07-15 (×3): 200 mg via ORAL
  Filled 2019-07-14 (×3): qty 2

## 2019-07-14 MED ORDER — BISACODYL 5 MG PO TBEC: 5.0000 mg | DELAYED_RELEASE_TABLET | Freq: Every day | ORAL | Status: DC | PRN

## 2019-07-14 MED ORDER — MELATONIN 5 MG PO TABS
10.0000 mg | ORAL_TABLET | Freq: Every evening | ORAL | Status: DC | PRN
  Administered 2019-07-14 (×2): 10 mg via ORAL
  Filled 2019-07-14 (×2): qty 2

## 2019-07-14 MED ORDER — FLEET ENEMA 7-19 GM/118ML RE ENEM
1.0000 | ENEMA | Freq: Once | RECTAL | Status: AC
  Administered 2019-07-14: 1 via RECTAL

## 2019-07-14 MED ORDER — NON FORMULARY: 10.0000 mg | Freq: Every evening | Status: DC | PRN

## 2019-07-14 MED ORDER — DIPHENHYDRAMINE HCL 12.5 MG/5ML PO ELIX
12.5000 mg | ORAL_SOLUTION | Freq: Every evening | ORAL | Status: DC | PRN
  Administered 2019-07-14: 12.5 mg via ORAL
  Filled 2019-07-14: qty 5

## 2019-07-14 NOTE — Progress Notes (Addendum)
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 Continue on po levaquin. Marland Kitchen Respiratory pathogen panel is negative. Strep, legionella are both neg. Will need to follow up with Dr. Vernard Gambles on Friday 07/23/19 for bronchospy and stenting of her airway as outpatient.   Acute on chronic hypoxic respiraotry failure: currently mid 90s on 4L [place]. At home the patient is on no oxygen, but she has had progression of shortness of breath over the past 2 months. Worsening mediastinal, hilar, and supracervical lymph nodes and trace effusion, concerning for cancer recurrence. Didn't improve with steroids prescribed last month. No PE on CTA. Continue on her home Trelegy and duonebs.  Bilateral pulmonary masses: will follow up with oncology as outpatient.  A-flutter: w/ RVR which has now resolved. Continue on diltiazem.   Anxiety: severity unknown. Continue home lexapro, buspar  Hyponatremia: Mild, chronic and likely due to active pulmonary process. Will continue to monitor   Anemia: LIkely of chronic disease.  No need for a transfusion at this time   Restless leg syndrome: Continue home requip  Generalized weakness: PT/OT consulted   DVT prophylaxis: SCDs Code Status: DNR Family Communication: discussed pt's care w/ pt's daughter, [name], and answered her questions  Disposition Plan: can likely d/c in 24-48 hours as pt clinically improves; PT/OT still needs to see the pt    Consultants:   pulmon   Procedures:    Antimicrobials:    Subjective: Pt c/o shortness of breath especially w/  exertion  Objective: Vitals:   07/13/19 2137 07/13/19 2139 07/13/19 2359 07/14/19 0723  BP: 114/90  120/66 (!) 102/51  Pulse: (!) 47 67 83 88  Resp:   16 17  Temp: 98.1 F (36.7 C)  98 F (36.7 C) 98 F (36.7 C)  TempSrc: Oral  Oral Oral  SpO2: 93%  94% 95%  Weight:      Height:        Intake/Output Summary (Last 24 hours) at 07/14/2019 0747 Last data filed at 07/14/2019 0000 Gross per 24 hour  Intake 0 ml  Output 550 ml  Net -550 ml   Filed Weights   07/09/19 1908 07/10/19 0112  Weight: 113.4 kg 111.9 kg    Examination:  General exam: Appears calm and comfortable  Respiratory system: decreased breath sounds b/l. No rales Cardiovascular system: S1 & S2. No  rubs, gallops or clicks.  Gastrointestinal system: Abdomen is nondistended, soft and nontender. Normal bowel sounds heard. Central nervous system: Alert and oriented. Moves all 4 extremities Psychiatry: Judgement and insight appear normal. Flat mood and affect   Data Reviewed: I have personally reviewed following labs and imaging studies  CBC: Recent Labs  Lab 07/09/19 2019 07/10/19 0514 07/12/19 0414  WBC 11.6* 10.8* 9.2  HGB 11.3* 10.6* 10.2*  HCT 35.7* 33.0* 32.4*  MCV 90.2 89.4 90.3  PLT 301 303 086   Basic Metabolic Panel: Recent Labs  Lab 07/09/19 2019 07/10/19 0514 07/12/19 0414  NA 130* 135 130*  K 3.9 3.8 4.8  CL 95* 101 96*  CO2 26 27 29   GLUCOSE 127* 137* 119*  BUN  17 19 21   CREATININE 0.76 0.68 0.72  CALCIUM 8.9 7.9* 8.1*   GFR: Estimated Creatinine Clearance: 84.9 mL/min (by C-G formula based on SCr of 0.72 mg/dL). Liver Function Tests: No results for input(s): AST, ALT, ALKPHOS, BILITOT, PROT, ALBUMIN in the last 168 hours. No results for input(s): LIPASE, AMYLASE in the last 168 hours. No results for input(s): AMMONIA in the last 168 hours. Coagulation Profile: No results for input(s): INR, PROTIME in the last 168 hours. Cardiac Enzymes: No results for input(s): CKTOTAL,  CKMB, CKMBINDEX, TROPONINI in the last 168 hours. BNP (last 3 results) No results for input(s): PROBNP in the last 8760 hours. HbA1C: No results for input(s): HGBA1C in the last 72 hours. CBG: No results for input(s): GLUCAP in the last 168 hours. Lipid Profile: No results for input(s): CHOL, HDL, LDLCALC, TRIG, CHOLHDL, LDLDIRECT in the last 72 hours. Thyroid Function Tests: No results for input(s): TSH, T4TOTAL, FREET4, T3FREE, THYROIDAB in the last 72 hours. Anemia Panel: No results for input(s): VITAMINB12, FOLATE, FERRITIN, TIBC, IRON, RETICCTPCT in the last 72 hours. Sepsis Labs: No results for input(s): PROCALCITON, LATICACIDVEN in the last 168 hours.  Recent Results (from the past 240 hour(s))  Expectorated sputum assessment w rflx to resp cult     Status: None   Collection Time: 07/06/19  4:30 PM   Specimen: Sputum  Result Value Ref Range Status   Specimen Description SPUTUM  Final   Special Requests SPUTUM  Final   Sputum evaluation   Final    Sputum specimen not acceptable for testing.  Please recollect.   Performed at [facility], [address] [place], [place] [address]    Report Status 07/07/2019 FINAL  Final  Blood culture (routine x 2)     Status: None   Collection Time: 07/09/19 10:56 PM   Specimen: BLOOD  Result Value Ref Range Status   Specimen Description BLOOD RIGHT ANTECUBITAL  Final   Special Requests   Final    BOTTLES DRAWN AEROBIC AND ANAEROBIC Blood Culture adequate volume   Culture   Final    NO GROWTH 5 DAYS Performed at [facility], [address] [place], [place] [address]    Report Status 07/14/2019 FINAL  Final  Blood culture (routine x 2)     Status: None   Collection Time: 07/09/19 10:56 PM   Specimen: BLOOD  Result Value Ref Range Status   Specimen Description BLOOD LEFT FOREARM  Final   Special Requests   Final    BOTTLES DRAWN AEROBIC AND ANAEROBIC Blood Culture adequate volume   Culture   Final    NO  GROWTH 5 DAYS Performed at [facility], [address] [place], [place] [address]    Report Status 07/14/2019 FINAL  Final  Respiratory Panel by RT PCR (Flu A&B, Covid) - Nasopharyngeal Swab     Status: None   Collection Time: 07/09/19 10:56 PM   Specimen: Nasopharyngeal Swab  Result Value Ref Range Status   SARS Coronavirus 2 by RT PCR NEGATIVE NEGATIVE Final    Comment: (NOTE) SARS-CoV-2 target nucleic acids are NOT DETECTED. The SARS-CoV-2 RNA is generally detectable in upper respiratoy specimens during the acute phase of infection. The lowest concentration of SARS-CoV-2 viral copies this assay can detect is 131 copies/mL. A negative result does not preclude SARS-Cov-2 infection and should not be used as the sole basis for treatment or other patient management decisions. A negative result may occur with  improper specimen  collection/handling, submission of specimen other than nasopharyngeal swab, presence of viral mutation(s) within the areas targeted by this assay, and inadequate number of viral copies (<131 copies/mL). A negative result must be combined with clinical observations, patient history, and epidemiological information. The expected result is Negative. Fact Sheet for Patients:  PinkCheek.be Fact Sheet for Healthcare Providers:  GravelBags.it This test is not yet ap proved or cleared by the [place] FDA and  has been authorized for detection and/or diagnosis of SARS-CoV-2 by FDA under an Emergency Use Authorization (EUA). This EUA will remain  in effect (meaning this test can be used) for the duration of the COVID-19 declaration under Section 564(b)(1) of the Act, 21 U.S.C. section 360bbb-3(b)(1), unless the authorization is terminated or revoked sooner.    Influenza A by PCR NEGATIVE NEGATIVE Final   Influenza B by PCR NEGATIVE NEGATIVE Final    Comment: (NOTE) The Xpert Xpress  SARS-CoV-2/FLU/RSV assay is intended as an aid in  the diagnosis of influenza from Nasopharyngeal swab specimens and  should not be used as a sole basis for treatment. Nasal washings and  aspirates are unacceptable for Xpert Xpress SARS-CoV-2/FLU/RSV  testing. Fact Sheet for Patients: PinkCheek.be Fact Sheet for Healthcare Providers: GravelBags.it This test is not yet approved or cleared by the [place] FDA and  has been authorized for detection and/or diagnosis of SARS-CoV-2 by  FDA under an Emergency Use Authorization (EUA). This EUA will remain  in effect (meaning this test can be used) for the duration of the  Covid-19 declaration under Section 564(b)(1) of the Act, 21  U.S.C. section 360bbb-3(b)(1), unless the authorization is  terminated or revoked. Performed at [facility], [address], [place], [place] [address]          Radiology Studies: No results found.      Scheduled Meds: . busPIRone  30 mg Oral BID  . Chlorhexidine Gluconate Cloth  6 each Topical Daily  . diltiazem  360 mg Oral Daily  . escitalopram  20 mg Oral Daily  . fluticasone furoate-vilanterol  1 puff Inhalation q morning - 10a   And  . umeclidinium bromide  1 puff Inhalation q morning - 10a  . furosemide  20 mg Oral Daily  . levofloxacin  750 mg Oral Daily  . magnesium oxide  200 mg Oral BID  . metoprolol tartrate  50 mg Oral BID  . pantoprazole  40 mg Oral Daily  . pravastatin  20 mg Oral Daily  . rOPINIRole  3 mg Oral QHS  . sodium chloride flush  3 mL Intravenous Q12H   Continuous Infusions: . sodium chloride       LOS: 5 days    Time spent: 33 mins    Wyvonnia Dusky, MD [facility] Pager [phone]  If 7PM-7AM, please contact night-coverage [email or web address] 07/14/2019, 7:47 AM

## 2019-07-14 NOTE — Evaluation (Signed)
Physical Therapy Evaluation Patient Details Name: Cathy Watts MRN: [id number] DOB: [birth year] Today's Date: 07/14/2019   History of Present Illness  presented to ER secondary to progressive SOB, cough; admitted for management of acute hypoxic respiratory failure due to post-obstructive PNA  Clinical Impression  Upon evaluation, patient alert and oriented; follows commands and demonstrates good effort with mobility tasks.  Bilat UE/LE strength and ROM grossly symmetrical and WFL; no focal weakness appreciated.  Able to complete bed mobility with mod indep; sit/stand, basic transfers and gait (29') with RW, cga/min assist.  Demonstrates reciprocal stepping pattern with mod WBing on RW; min cuing for walker use (tends to pick up with turns); mod SOB, desat  To 88% on 4L, recovering to >92% within 60 seconds of seated rest. Would benefit from skilled PT to address above deficits and promote optimal return to PLOF.; Recommend transition to HHPT upon discharge from acute hospitalization.  SaO2 on room air at rest = 84% SaO2 on 4L at rest = 94% SaO2 on 4 liters of O2 while ambulating = 88%, recovering to >92% within 60 seconds of seated rest.     Follow Up Recommendations Home health PT    Equipment Recommendations  Rolling walker with 5" wheels    Recommendations for Other Services       Precautions / Restrictions Precautions Precautions: Fall Restrictions Weight Bearing Restrictions: No      Mobility  Bed Mobility Overal bed mobility: Modified Independent                Transfers Overall transfer level: Needs assistance Equipment used: Rolling walker (2 wheeled) Transfers: Sit to/from Stand Sit to Stand: Min guard;Min assist         General transfer comment: cuing for hand placement  Ambulation/Gait Ambulation/Gait assistance: Min guard;Min assist Gait Distance (Feet): 60 Feet Assistive device: Rolling walker (2 wheeled)       General Gait Details:  reciprocal stepping pattern with mod WBing on RW; min cuing for walker use (tends to pick up with turns); mod SOB, desat  To 88% on 4L, recovering to >92% within 60 seconds of seated rest.   Stairs            Wheelchair Mobility    Modified Rankin (Stroke Patients Only)       Balance Overall balance assessment: Needs assistance Sitting-balance support: No upper extremity supported;Feet supported Sitting balance-Leahy Scale: Good       Standing balance-Leahy Scale: Fair                               Pertinent Vitals/Pain Pain Assessment: No/denies pain    Home Living Family/patient expects to be discharged to:: Private residence Living Arrangements: Children Available Help at Discharge: Family;Available 24 hours/day Type of Home: House Home Access: Stairs to enter Entrance Stairs-Rails: Right;Left;Can reach both Entrance Stairs-Number of Steps: 4 Home Layout: One level        Prior Function Level of Independence: Independent         Comments: Indep with ADLs, household and community mobilization without assist device; home O2 at nigh; denies fall history     Hand Dominance   Dominant Hand: Right    Extremity/Trunk Assessment   Upper Extremity Assessment Upper Extremity Assessment: Overall WFL for tasks assessed    Lower Extremity Assessment Lower Extremity Assessment: Overall WFL for tasks assessed       Communication   Communication: No difficulties  Cognition Arousal/Alertness: Awake/alert Behavior During Therapy: WFL for tasks assessed/performed Overall Cognitive Status: Within Functional Limits for tasks assessed                                        General Comments      Exercises Other Exercises Other Exercises: Educated on home safety modifications to minimize fall risk, decreased energy expenditure, O2 cord management; reviewed role of activity pacing and techniques for energy conservation.   Patient/daughter voiced understanding of all information.   Assessment/Plan    PT Assessment Patient needs continued PT services  PT Problem List Decreased activity tolerance;Cardiopulmonary status limiting activity;Obesity       PT Treatment Interventions DME instruction;Gait training;Stair training;Functional mobility training;Therapeutic activities;Therapeutic exercise;Balance training;Neuromuscular re-education;Patient/family education    PT Goals (Current goals can be found in the Care Plan section)  Acute Rehab PT Goals Patient Stated Goal: to get strength back PT Goal Formulation: With patient/family Time For Goal Achievement: 07/28/19 Potential to Achieve Goals: Good    Frequency Min 2X/week   Barriers to discharge        Co-evaluation               AM-PAC PT "6 Clicks" Mobility  Outcome Measure Help needed turning from your back to your side while in a flat bed without using bedrails?: None Help needed moving from lying on your back to sitting on the side of a flat bed without using bedrails?: None Help needed moving to and from a bed to a chair (including a wheelchair)?: A Little Help needed standing up from a chair using your arms (e.g., wheelchair or bedside chair)?: A Little Help needed to walk in hospital room?: A Little Help needed climbing 3-5 steps with a railing? : A Little 6 Click Score: 20    End of Session Equipment Utilized During Treatment: Gait belt;Oxygen Activity Tolerance: Patient tolerated treatment well Patient left: in chair;with call bell/phone within reach;with chair alarm set;with family/visitor present Nurse Communication: Mobility status PT Visit Diagnosis: Unsteadiness on feet (R26.81);Muscle weakness (generalized) (M62.81);Difficulty in walking, not elsewhere classified (R26.2)    Time: 5945-8592 PT Time Calculation (min) (ACUTE ONLY): 26 min   Charges:   PT Evaluation $PT Eval Moderate Complexity: 1 Mod PT  Treatments $Therapeutic Activity: 8-22 mins       Ruhee Enck H. Owens Shark, PT, DPT, NCS 07/14/19, 4:55 PM [phone]

## 2019-07-14 NOTE — TOC Transition Note (Signed)
Transition of Care [facility]) - CM/SW Discharge Note   Patient Details  Name: Cathy Watts MRN: [id number] Date of Birth: [birth year]  [facility] CM/SW Contact:  Elease Hashimoto, LCSW Phone Number: 07/14/2019, 11:02 AM   Clinical Narrative:   Met with pt who reports she has a bsc but does need a rw and have asked MD to order one along with a portable O2 tank for home. Pt has a concentrator already. Two daughter's will be providing care to pt at home and taking her to her follow up appointments. She feels ready to go home today. Daughter's are aware she will need 24 hr care and are prepared to provide this. [facility] aware of the orders and will be bringing up to her room prior to DC. Pt does needs Home Health PT & RN. Have found [facility] to take the referral and will see for PT &b RN. Pt is set for discharge today.     Final next level of care: Home/Self Care Barriers to Discharge: Barriers Resolved   Patient Goals and CMS Choice Patient states their goals for this hospitalization and ongoing recovery are:: I hope to be able to breath better and then go home      Discharge Placement                Patient to be transferred to facility by: Home by daughter's Name of family member notified: daughter Patient and family notified of of transfer: 07/14/19  Discharge Plan and Services In-house Referral: Clinical Social Work              DME Arranged: Oxygen, Walker rolling DME Agency: [employer] Date DME Agency Contacted: 07/13/19 Time DME Agency Contacted: 50 Representative spoke with at DME Agency: Leroy Sea            Social Determinants of Health ([place]) Interventions     Readmission Risk Interventions No flowsheet data found.

## 2019-07-15 ENCOUNTER — Inpatient Hospital Stay: Admit: 2019-07-15 | Payer: Medicare Other

## 2019-07-15 ENCOUNTER — Ambulatory Visit: Admission: RE | Admit: 2019-07-15 | Payer: Medicare Other | Source: Ambulatory Visit

## 2019-07-15 LAB — CBC
HCT: 32.9 % — ABNORMAL LOW (ref 36.0–46.0)
Hemoglobin: 10.8 g/dL — ABNORMAL LOW (ref 12.0–15.0)
MCH: 28.6 pg (ref 26.0–34.0)
MCHC: 32.8 g/dL (ref 30.0–36.0)
MCV: 87 fL (ref 80.0–100.0)
Platelets: 353 10*3/uL (ref 150–400)
RBC: 3.78 MIL/uL — ABNORMAL LOW (ref 3.87–5.11)
RDW: 14.9 % (ref 11.5–15.5)
WBC: 9.2 10*3/uL (ref 4.0–10.5)
nRBC: 0 % (ref 0.0–0.2)

## 2019-07-15 LAB — BASIC METABOLIC PANEL
Anion gap: 7 (ref 5–15)
BUN: 17 mg/dL (ref 8–23)
CO2: 30 mmol/L (ref 22–32)
Calcium: 8.7 mg/dL — ABNORMAL LOW (ref 8.9–10.3)
Chloride: 89 mmol/L — ABNORMAL LOW (ref 98–111)
Creatinine, Ser: 0.56 mg/dL (ref 0.44–1.00)
GFR calc Af Amer: >60 mL/min (ref >60)
GFR calc non Af Amer: >60 mL/min (ref >60)
Glucose, Bld: 151 mg/dL — ABNORMAL HIGH (ref 70–99)
Potassium: 4.6 mmol/L (ref 3.5–5.1)
Sodium: 126 mmol/L — ABNORMAL LOW (ref 135–145)

## 2019-07-15 MED ORDER — LEVOFLOXACIN 750 MG PO TABS: 750.0000 mg | ORAL_TABLET | Freq: Every day | ORAL | 0 refills | Status: DC

## 2019-07-15 MED ORDER — DILTIAZEM HCL ER COATED BEADS 360 MG PO CP24: 360.0000 mg | ORAL_CAPSULE | Freq: Every day | ORAL | 0 refills | Status: AC

## 2019-07-15 MED ORDER — SODIUM CHLORIDE 0.9 % IV SOLN: INTRAVENOUS | Status: DC

## 2019-07-15 NOTE — Care Management Important Message (Signed)
Important Message  Patient Details  Name: Cathy Watts MRN: [id number] Date of Birth: 02-02-1946   Medicare Important Message Given:  Yes     Juliann Pulse A Jalin Alicea 07/15/2019, 11:39 AM

## 2019-07-15 NOTE — Telephone Encounter (Signed)
Spoke to [place] with Pre admit testing, who stated that pt's pre admit appointment has been rescheduled until 07/19/2019 between 1-5p due pt being admitted.  Pt's daughter, Sherry(DPR) is aware and voiced her understanding.

## 2019-07-15 NOTE — Discharge Summary (Signed)
Physician Discharge Summary  Cathy Watts BHA:[id number] DOB: 12/27/1945 DOA: 07/09/2019  PCP: Mar Daring, PA-C  Admit date: 07/09/2019 Discharge date: 07/15/2019  Admitted From: home Disposition:  Home w/ home health   Recommendations for Outpatient Follow-up:  1. Follow up with PCP in 1 week 2. F/u pulmon in 1 week   Home Health: yes  Equipment/Devices: walker, supplemental oxygen 4L [place]   Discharge Condition: stable  CODE STATUS: DNR  Diet recommendation: Heart Healthy  Brief/Interim Summary: HPI was taken from Dr. Si Raider: Cathy Watts is a 74 y.o. female with medical history significant for lung cancer s/p radiation/chemotherapy in active surveillance, osa not on cpap, paroxysmal a flutter, copd former smoker, aaa, barrett's esophagus, who presents with above.  Hospitalized last month for sob, treated for a-fib and copd exacerbation. Says never really improved after returning home, and symptoms have slowly worsened. Shortness of breath that is worse with exertion. Feeling very fatigued. Has developed a productive cough the past few days. No wheeze. No covid contacts. No fevers. No chest pain. Tolerating diet, no n/v/d. No abdominal pain. No new leg swelling or new orthopnea.  Recently started on levaquin, has been taking, hasn't noticed that it has helped. Had an echocardiogram performed by cardiology at [facility] 2 days ago, the read is pending  ED Course: labs, imaging, antibiotics. Initially hypoxic to mid-80s, resolved with [facility] Course from Dr. Lenna Sciara. 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 Pt is scheduled outpatient w/ Dr. Patsey Berthold on 07/23/19 for bronchoscopy and stenting of her airway. Pt was treated inpatient w/ IV abxs, bronchodilators and supplemental oxygen. Pt was not able to be weaned from supplemental oxygen and was d/c home w/ oxygen,  4L [place]. Of note, pt was found to have a flutter w/ RVR which has since resolved and pt's home dose of diltiazem dose was increased to 360mg  daily. PT/OT saw the pt and recommended home health. Home health was set up prior to d/c by CM. 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 Continue on po levaquin. Marland Kitchen Respiratory pathogen panel is negative. Strep, legionella are both neg. Will need to follow up with Dr. Vernard Gambles on Friday 07/23/19 for bronchospy and stenting of her airway as outpatient.   Acute on chronic hypoxic respiraotry failure: currently mid 90s on 4L [place]. At home the patient is on no oxygen, but she has had progression of shortness of breath over the past 2 months. Worsening mediastinal, hilar, and supracervical lymph nodes and trace effusion, concerning for cancer recurrence. Didn't improve with steroids prescribed last month. No PE on CTA. Continue on her home Trelegy and duonebs.  Bilateral pulmonary masses: will follow up with oncology as outpatient.  A-flutter: w/ RVR which has now resolved. Continue on diltiazem.   Anxiety: severity unknown. Continue home lexapro, buspar  Hyponatremia: Mild, chronic and likely due to active pulmonary process. Will continue to monitor   Anemia: LIkely of chronic disease.  No need for a transfusion at this time   Restless leg syndrome: Continue home requip  Generalized weakness: PT/OT recs home  health   Obesity: BMI 35.4. Would benefit from weight loss    Discharge Instructions  Discharge Instructions    Diet - low sodium heart healthy   Complete by: As directed    Discharge instructions   Complete  by: As directed    F/U PCP in 1 week; F/u pulmon in 1 week   Increase activity slowly   Complete by: As directed      Allergies as of 07/15/2019      Reactions   Amoxicillin Hives, Itching   Did it involve swelling of the face/tongue/throat, SOB, or low BP? No Did it involve sudden or severe rash/hives, skin peeling, or any reaction on the inside of your mouth or nose? No Did you need to seek medical attention at a hospital or doctor's office? No When did it last happen?2018 If all above answers are "NO", may proceed with cephalosporin use.      Medication List    TAKE these medications   apixaban 5 MG Tabs tablet Commonly known as: ELIQUIS Take 1 tablet (5 mg total) by mouth 2 (two) times daily.   busPIRone 30 MG tablet Commonly known as: BUSPAR Take 1 tablet by mouth twice daily   diltiazem 360 MG 24 hr capsule Commonly known as: CARDIZEM CD Take 1 capsule (360 mg total) by mouth daily. Start taking on: July 16, 2019 What changed:   medication strength  how much to take   escitalopram 20 MG tablet Commonly known as: LEXAPRO Take 1 tablet by mouth once daily   Flutter Devi 1 each by Does not apply route QID.   furosemide 20 MG tablet Commonly known as: LASIX Take 1 tablet (20 mg total) by mouth daily.   guaiFENesin-dextromethorphan 100-10 MG/5ML syrup Commonly known as: ROBITUSSIN DM Take 5 mLs by mouth every 4 (four) hours as needed for cough.   ipratropium 17 MCG/ACT inhaler Commonly known as: ATROVENT HFA Inhale 2 puffs into the lungs 4 (four) times daily.   ipratropium-albuterol 0.5-2.5 (3) MG/3ML Soln Commonly known as: DUONEB Take 3 mLs by nebulization every 6 (six) hours as needed. What changed: reasons to take this   levofloxacin 750 MG tablet Commonly known as: LEVAQUIN Take 1 tablet (750 mg total) by mouth daily for 5 days. Start taking on: July 16, 2019 What changed:   medication strength  how much to take   Magnesium 250 MG  Tabs Take 250 mg by mouth 2 (two) times daily.   metoprolol tartrate 50 MG tablet Commonly known as: LOPRESSOR Take 50 mg by mouth 2 (two) times daily.   nystatin 100000 UNIT/ML suspension Commonly known as: MYCOSTATIN Take 5 mLs (500,000 Units total) by mouth 4 (four) times daily.   omeprazole 40 MG capsule Commonly known as: PRILOSEC Take 1 capsule by mouth once daily   polyethylene glycol powder 17 GM/SCOOP powder Commonly known as: GLYCOLAX/MIRALAX TAKE 17 GRAMS OF POWDER MIXED IN 8 OUNCES OF WATER BY MOUTH ONCE A DAY.   potassium chloride SA 20 MEQ tablet Commonly known as: KLOR-CON Take 1 tablet (20 mEq total) by mouth daily.   pravastatin 20 MG tablet Commonly known as: PRAVACHOL TAKE 1 TABLET BY MOUTH AT BEDTIME What changed: when to take this   ProAir HFA 108 (90 Base) MCG/ACT inhaler Generic drug: albuterol INHALE 2 PUFFS BY MOUTH EVERY 4 HOURS AS NEEDED FOR WHEEZING FOR SHORTNESS OF BREATH What changed: See the new instructions.   rOPINIRole 3 MG tablet Commonly known as: REQUIP TAKE 1  TABLET BY MOUTH AT BEDTIME   sucralfate 1 g tablet Commonly known as: CARAFATE Take 1 g by mouth 3 (three) times daily. DISSOLVE IN THREE TO FOUR TABLESPOONFULS OF WARM WARM SWISH AND SWALLOW   Trelegy Ellipta 100-62.5-25 MCG/INH Aepb Generic drug: Fluticasone-Umeclidin-Vilant Inhale 1 puff into the lungs daily.            Durable Medical Equipment  (From admission, onward)         Start     Ordered   07/14/19 1102  For home use only DME Walker rolling  Once    Question Answer Comment  Walker: With 5 Inch Wheels   Patient needs a walker to treat with the following condition Generalized weakness      07/14/19 1101   07/13/19 1436  For home use only DME oxygen  Once    Question Answer Comment  Length of Need Lifetime   Mode or (Route) Nasal cannula   Liters per Minute 3   Oxygen conserving device Yes   Oxygen delivery system Gas      07/13/19 1435           Allergies  Allergen Reactions  . Amoxicillin Hives and Itching    Did it involve swelling of the face/tongue/throat, SOB, or low BP? No Did it involve sudden or severe rash/hives, skin peeling, or any reaction on the inside of your mouth or nose? No Did you need to seek medical attention at a hospital or doctor's office? No When did it last happen?2018 If all above answers are "NO", may proceed with cephalosporin use.       Consultations:  pulmon   Procedures/Studies: DG Chest 2 View  Result Date: 07/09/2019 CLINICAL DATA:  Shortness of breath EXAM: CHEST - 2 VIEW COMPARISON:  06/28/2019 FINDINGS: Perihilar airspace opacities are again noted. These have not substantially changed since the prior study. No pneumothorax. No large pleural effusion. The Port-A-Cath is stable in positioning. Heart size is stable. Aortic calcifications are noted. IMPRESSION: 1. No acute cardiopulmonary process. 2. Stable appearance of the lungs as detailed above. Electronically Signed   By: Constance Holster M.D.   On: 07/09/2019 19:50   DG Chest 2 View  Result Date: 06/28/2019 CLINICAL DATA:  Chest pain, lung cancer EXAM: CHEST - 2 VIEW COMPARISON:  06/17/2019 FINDINGS: Right chest wall port catheter is unchanged. Bilateral perihilar opacities are unchanged. No pleural effusion or pneumothorax. Heart size is stable. Cholecystectomy clips. IMPRESSION: No acute finding. Stable appearance of bilateral perihilar opacities favored to reflect sequelae of radiation. Electronically Signed   By: Macy Mis M.D.   On: 06/28/2019 15:59   DG Chest 2 View  Result Date: 06/17/2019 CLINICAL DATA:  74 year old female with shortness of breath and cough for 2 weeks. History of bilateral lung cancer, more recently on the left side last year. EXAM: CHEST - 2 VIEW COMPARISON:  Chest CT 04/12/2019 and earlier. Most recent chest radiographs 04/25/2018. FINDINGS: Both hilar contours are abnormal. That on the right  appears stable since 2019. That on the left appears grossly stable from the December CT when left perihilar radiation changes were suspected. Lower lung volumes. No cardiomegaly. Right chest porta cath in place. No pneumothorax, pulmonary edema or pleural effusion. Negative visible bowel gas pattern. Stable cholecystectomy clips. No acute osseous abnormality identified. IMPRESSION: 1. Lower lung volumes, but otherwise stable chest when compared to the December CT. Perihilar post radiation changes suspected. 2. No new cardiopulmonary abnormality identified. Electronically Signed  By: Genevie Ann M.D.   On: 06/17/2019 16:44   CT Angio Chest PE W and/or Wo Contrast  Result Date: 07/09/2019 CLINICAL DATA:  Shortness of breath x2 months. EXAM: CT ANGIOGRAPHY CHEST WITH CONTRAST TECHNIQUE: Multidetector CT imaging of the chest was performed using the standard protocol during bolus administration of intravenous contrast. Multiplanar CT image reconstructions and MIPs were obtained to evaluate the vascular anatomy. CONTRAST:  21mL OMNIPAQUE IOHEXOL 350 MG/ML SOLN COMPARISON:  April 12, 2019 FINDINGS: Cardiovascular: Contrast injection is sufficient to demonstrate satisfactory opacification of the pulmonary arteries to the segmental level. There is no pulmonary embolus. The main pulmonary artery is within normal limits for size. There is no CT evidence of acute right heart strain. There are atherosclerotic changes of the visualized thoracic aorta without evidence for an aneurysm. Heart size is normal. Coronary artery calcifications are noted. Mediastinum/Nodes: --there is new mediastinal adenopathy. For example there is a pretracheal lymph node measuring approximately 1 cm (axial series 4, image 34). There is a precarinal lymph node measuring approximately 1.4 cm in the short axis (previously measuring less than 4 mm). Hilar adenopathy is noted. --No axillary lymphadenopathy. --there are few mildly prominent left  supraclavicular lymph nodes measuring up to approximately 0.8 cm (axial series 4, image 7). These are new since prior CT. --Normal thyroid gland. --The esophagus is unremarkable Lungs/Pleura: There is a worsening ill-defined left hilar mass causing compression of the left mainstem bronchus as well as the left upper and left lower lobe bronchi. There is worsening postobstructive atelectasis in the superior left lower lobe and left upper lobe. The right perihilar region is essentially stable in appearance from prior study. There are small bilateral pleural effusions, right greater than left. Moderate emphysematous changes are noted bilaterally. There are branching airspace opacities in the left upper lobe concerning for pneumonia. There are few sub solid airspace opacities in the medial right lower lobe which may represent postobstructive atelectasis or pneumonia. Upper Abdomen: No acute abnormality. Musculoskeletal: No chest wall abnormality. No acute or significant osseous findings. IMPRESSION: 1. No acute pulmonary embolism. 2. Postobstructive pneumonia in the left upper lobe. 3. Worsening postobstructive findings in the left upper and left lower lobes with worsening ill-defined soft tissue in the left hilum is concerning for disease progression. Pulmonary medicine follow-up is recommended. 4. Worsening mediastinal, hilar, and left supraclavicular adenopathy. While this may be reactive, nodal metastatic disease is not excluded. 5. Trace to small bilateral pleural effusions. Aortic Atherosclerosis (ICD10-I70.0) and Emphysema (ICD10-J43.9). Electronically Signed   By: Constance Holster M.D.   On: 07/09/2019 22:09   MR BRAIN W WO CONTRAST  Result Date: 07/14/2019 CLINICAL DATA:  74 year old female with right lung cancer, non-small cell. Suspicion of disease progression in the chest recently. Staging. EXAM: MRI HEAD WITHOUT AND WITH CONTRAST TECHNIQUE: Multiplanar, multiecho pulse sequences of the brain and  surrounding structures were obtained without and with intravenous contrast. CONTRAST:  68mL GADAVIST GADOBUTROL 1 MMOL/ML IV SOLN COMPARISON:  Noncontrast head CTs 06/27/2017 and earlier. FINDINGS: Brain: No restricted diffusion to suggest acute infarction. No midline shift, mass effect, evidence of mass lesion, ventriculomegaly, extra-axial collection or acute intracranial hemorrhage. Cervicomedullary junction and pituitary are within normal limits. No abnormal enhancement identified. There is motion artifact on the postcontrast images. No dural thickening. Scattered nonspecific cerebral white matter T2 and FLAIR hyperintensity, mild to moderate for age. None resembles vasogenic edema. Similar patchy T2 hyperintensity in the pons. No cortical encephalomalacia or chronic cerebral blood products identified. The  deep gray nuclei and cerebellum appear negative. Vascular: Major intracranial vascular flow voids are preserved. Skull and upper cervical spine: Visualized bone marrow signal is within normal limits. Sinuses/Orbits: Postoperative changes to both globes, otherwise negative orbits. Paranasal sinuses are clear. Other: Mild bilateral mastoid effusions. Visible internal auditory structures appear normal. Scalp and face soft tissues appear negative. IMPRESSION: 1. No metastatic disease or acute intracranial abnormality identified. 2. Mild to moderate for age signal changes in the cerebral white matter and pons, most commonly due to chronic small vessel disease. Electronically Signed   By: Genevie Ann M.D.   On: 07/14/2019 23:51       Subjective: Pt c/o shortness of breath   Discharge Exam: Vitals:   07/15/19 0737 07/15/19 0922  BP:  121/70  Pulse:  68  Resp:    Temp:    SpO2: 98%    Vitals:   07/14/19 2359 07/15/19 0736 07/15/19 0737 07/15/19 0922  BP: 113/86 116/81  121/70  Pulse: 85 72  68  Resp: 16 (!) 22    Temp: 98.5 F (36.9 C) 98.3 F (36.8 C)    TempSrc: Oral Oral    SpO2: 94% 98% 98%    Weight:      Height:        General: Pt is alert, awake, not in acute distress Cardiovascular: irregularly irregular, no rubs, no gallops Respiratory: diminished breath sounds b/l. No rales Abdominal: Soft, NT, obese, bowel sounds + Extremities: b/l LE edema , no cyanosis    The results of significant diagnostics from this hospitalization (including imaging, microbiology, ancillary and laboratory) are listed below for reference.     Microbiology: Recent Results (from the past 240 hour(s))  Expectorated sputum assessment w rflx to resp cult     Status: None   Collection Time: 07/06/19  4:30 PM   Specimen: Sputum  Result Value Ref Range Status   Specimen Description SPUTUM  Final   Special Requests SPUTUM  Final   Sputum evaluation   Final    Sputum specimen not acceptable for testing.  Please recollect.   Performed at [facility], [address] [place], [place] [address]    Report Status 07/07/2019 FINAL  Final  Blood culture (routine x 2)     Status: None   Collection Time: 07/09/19 10:56 PM   Specimen: BLOOD  Result Value Ref Range Status   Specimen Description BLOOD RIGHT ANTECUBITAL  Final   Special Requests   Final    BOTTLES DRAWN AEROBIC AND ANAEROBIC Blood Culture adequate volume   Culture   Final    NO GROWTH 5 DAYS Performed at [facility], [address], [place], [place] [address]    Report Status 07/14/2019 FINAL  Final  Blood culture (routine x 2)     Status: None   Collection Time: 07/09/19 10:56 PM   Specimen: BLOOD  Result Value Ref Range Status   Specimen Description BLOOD LEFT FOREARM  Final   Special Requests   Final    BOTTLES DRAWN AEROBIC AND ANAEROBIC Blood Culture adequate volume   Culture   Final    NO GROWTH 5 DAYS Performed at [facility], [address] [place], [place] [address]    Report Status 07/14/2019 FINAL  Final  Respiratory Panel by RT PCR (Flu A&B, Covid) - Nasopharyngeal Swab     Status:  None   Collection Time: 07/09/19 10:56 PM   Specimen: Nasopharyngeal Swab  Result Value Ref Range Status  SARS Coronavirus 2 by RT PCR NEGATIVE NEGATIVE Final    Comment: (NOTE) SARS-CoV-2 target nucleic acids are NOT DETECTED. The SARS-CoV-2 RNA is generally detectable in upper respiratoy specimens during the acute phase of infection. The lowest concentration of SARS-CoV-2 viral copies this assay can detect is 131 copies/mL. A negative result does not preclude SARS-Cov-2 infection and should not be used as the sole basis for treatment or other patient management decisions. A negative result may occur with  improper specimen collection/handling, submission of specimen other than nasopharyngeal swab, presence of viral mutation(s) within the areas targeted by this assay, and inadequate number of viral copies (<131 copies/mL). A negative result must be combined with clinical observations, patient history, and epidemiological information. The expected result is Negative. Fact Sheet for Patients:  PinkCheek.be Fact Sheet for Healthcare Providers:  GravelBags.it This test is not yet ap proved or cleared by the [place] FDA and  has been authorized for detection and/or diagnosis of SARS-CoV-2 by FDA under an Emergency Use Authorization (EUA). This EUA will remain  in effect (meaning this test can be used) for the duration of the COVID-19 declaration under Section 564(b)(1) of the Act, 21 U.S.C. section 360bbb-3(b)(1), unless the authorization is terminated or revoked sooner.    Influenza A by PCR NEGATIVE NEGATIVE Final   Influenza B by PCR NEGATIVE NEGATIVE Final    Comment: (NOTE) The Xpert Xpress SARS-CoV-2/FLU/RSV assay is intended as an aid in  the diagnosis of influenza from Nasopharyngeal swab specimens and  should not be used as a sole basis for treatment. Nasal washings and  aspirates are unacceptable for Xpert  Xpress SARS-CoV-2/FLU/RSV  testing. Fact Sheet for Patients: PinkCheek.be Fact Sheet for Healthcare Providers: GravelBags.it This test is not yet approved or cleared by the [place] FDA and  has been authorized for detection and/or diagnosis of SARS-CoV-2 by  FDA under an Emergency Use Authorization (EUA). This EUA will remain  in effect (meaning this test can be used) for the duration of the  Covid-19 declaration under Section 564(b)(1) of the Act, 21  U.S.C. section 360bbb-3(b)(1), unless the authorization is  terminated or revoked. Performed at [facility], [address] [place], [place] [address]      Labs: BNP (last 3 results) Recent Labs    07/09/19 2019  BNP 829.9*   Basic Metabolic Panel: Recent Labs  Lab 07/09/19 2019 07/10/19 0514 07/12/19 0414 07/15/19 0349  NA 130* 135 130* 126*  K 3.9 3.8 4.8 4.6  CL 95* 101 96* 89*  CO2 26 27 29 30   GLUCOSE 127* 137* 119* 151*  BUN 17 19 21 17   CREATININE 0.76 0.68 0.72 0.56  CALCIUM 8.9 7.9* 8.1* 8.7*   Liver Function Tests: No results for input(s): AST, ALT, ALKPHOS, BILITOT, PROT, ALBUMIN in the last 168 hours. No results for input(s): LIPASE, AMYLASE in the last 168 hours. No results for input(s): AMMONIA in the last 168 hours. CBC: Recent Labs  Lab 07/09/19 2019 07/10/19 0514 07/12/19 0414 07/15/19 0349  WBC 11.6* 10.8* 9.2 9.2  HGB 11.3* 10.6* 10.2* 10.8*  HCT 35.7* 33.0* 32.4* 32.9*  MCV 90.2 89.4 90.3 87.0  PLT 301 303 336 353   Cardiac Enzymes: No results for input(s): CKTOTAL, CKMB, CKMBINDEX, TROPONINI in the last 168 hours. BNP: Invalid input(s): POCBNP CBG: No results for input(s): GLUCAP in the last 168 hours. D-Dimer No results for input(s): DDIMER in the last 72 hours. Hgb A1c No results for input(s): HGBA1C in  the last 72 hours. Lipid Profile No results for input(s): CHOL, HDL, LDLCALC, TRIG, CHOLHDL, LDLDIRECT  in the last 72 hours. Thyroid function studies No results for input(s): TSH, T4TOTAL, T3FREE, THYROIDAB in the last 72 hours.  Invalid input(s): FREET3 Anemia work up No results for input(s): VITAMINB12, FOLATE, FERRITIN, TIBC, IRON, RETICCTPCT in the last 72 hours. Urinalysis    Component Value Date/Time   COLORURINE YELLOW (A) 06/28/2019 1655   APPEARANCEUR CLEAR (A) 06/28/2019 1655   LABSPEC 1.014 06/28/2019 1655   PHURINE 5.0 06/28/2019 1655   GLUCOSEU NEGATIVE 06/28/2019 1655   HGBUR SMALL (A) 06/28/2019 1655   BILIRUBINUR NEGATIVE 06/28/2019 1655   KETONESUR NEGATIVE 06/28/2019 1655   PROTEINUR NEGATIVE 06/28/2019 1655   NITRITE NEGATIVE 06/28/2019 1655   LEUKOCYTESUR NEGATIVE 06/28/2019 1655   Sepsis Labs Invalid input(s): PROCALCITONIN,  WBC,  LACTICIDVEN Microbiology Recent Results (from the past 240 hour(s))  Expectorated sputum assessment w rflx to resp cult     Status: None   Collection Time: 07/06/19  4:30 PM   Specimen: Sputum  Result Value Ref Range Status   Specimen Description SPUTUM  Final   Special Requests SPUTUM  Final   Sputum evaluation   Final    Sputum specimen not acceptable for testing.  Please recollect.   Performed at [facility], [address] [place], [place] [address]    Report Status 07/07/2019 FINAL  Final  Blood culture (routine x 2)     Status: None   Collection Time: 07/09/19 10:56 PM   Specimen: BLOOD  Result Value Ref Range Status   Specimen Description BLOOD RIGHT ANTECUBITAL  Final   Special Requests   Final    BOTTLES DRAWN AEROBIC AND ANAEROBIC Blood Culture adequate volume   Culture   Final    NO GROWTH 5 DAYS Performed at [facility], [address] [place], [place] [address]    Report Status 07/14/2019 FINAL  Final  Blood culture (routine x 2)     Status: None   Collection Time: 07/09/19 10:56 PM   Specimen: BLOOD  Result Value Ref Range Status   Specimen Description BLOOD LEFT FOREARM  Final    Special Requests   Final    BOTTLES DRAWN AEROBIC AND ANAEROBIC Blood Culture adequate volume   Culture   Final    NO GROWTH 5 DAYS Performed at [facility], [address] [place],  [address]    Report Status 07/14/2019 FINAL  Final  Respiratory Panel by RT PCR (Flu A&B, Covid) - Nasopharyngeal Swab     Status: None   Collection Time: 07/09/19 10:56 PM   Specimen: Nasopharyngeal Swab  Result Value Ref Range Status   SARS Coronavirus 2 by RT PCR NEGATIVE NEGATIVE Final    Comment: (NOTE) SARS-CoV-2 target nucleic acids are NOT DETECTED. The SARS-CoV-2 RNA is generally detectable in upper respiratoy specimens during the acute phase of infection. The lowest concentration of SARS-CoV-2 viral copies this assay can detect is 131 copies/mL. A negative result does not preclude SARS-Cov-2 infection and should not be used as the sole basis for treatment or other patient management decisions. A negative result may occur with  improper specimen collection/handling, submission of specimen other than nasopharyngeal swab, presence of viral mutation(s) within the areas targeted by this assay, and inadequate number of viral copies (<131 copies/mL). A negative result must be combined with clinical observations, patient history, and epidemiological information. The expected result is Negative. Fact Sheet for Patients:  PinkCheek.be Fact Sheet for Healthcare Providers:  GravelBags.it This test is not yet ap proved or cleared by the [place] FDA and  has been authorized for detection and/or diagnosis of SARS-CoV-2 by FDA under an Emergency Use Authorization (EUA). This EUA will remain  in effect (meaning this test can be used) for the duration of the COVID-19 declaration under Section 564(b)(1) of the Act, 21 U.S.C. section 360bbb-3(b)(1), unless the authorization is terminated or revoked sooner.    Influenza A by  PCR NEGATIVE NEGATIVE Final   Influenza B by PCR NEGATIVE NEGATIVE Final    Comment: (NOTE) The Xpert Xpress SARS-CoV-2/FLU/RSV assay is intended as an aid in  the diagnosis of influenza from Nasopharyngeal swab specimens and  should not be used as a sole basis for treatment. Nasal washings and  aspirates are unacceptable for Xpert Xpress SARS-CoV-2/FLU/RSV  testing. Fact Sheet for Patients: PinkCheek.be Fact Sheet for Healthcare Providers: GravelBags.it This test is not yet approved or cleared by the [place] FDA and  has been authorized for detection and/or diagnosis of SARS-CoV-2 by  FDA under an Emergency Use Authorization (EUA). This EUA will remain  in effect (meaning this test can be used) for the duration of the  Covid-19 declaration under Section 564(b)(1) of the Act, 21  U.S.C. section 360bbb-3(b)(1), unless the authorization is  terminated or revoked. Performed at [facility], [address], [place], [place] [address]      Time coordinating discharge: Over 30 minutes  SIGNED:   Wyvonnia Dusky, MD  [facility] 07/15/2019, 1:28 PM Pager   If 7PM-7AM, please contact night-coverage [email or web address]

## 2019-07-15 NOTE — Evaluation (Signed)
Occupational Therapy Evaluation Patient Details Name: Cathy Watts MRN: [id number] DOB: [birth year] Today's Date: 07/15/2019    History of Present Illness Pt presented to ER secondary to progressive SOB, cough; admitted for management of acute hypoxic respiratory failure due to post-obstructive PNA.  Pt's PMH includes dyspnea, sleep apnea, R lung cancer, and atrial flutter.   Clinical Impression   Pt seen for OT evaluation in context of postobstructive pneumonia.  Pt presents with decreased occupational participation 2/2 shortness of breath and limited activity tolerance.  Pt was independent with all ADL prior to admission, she enjoys playing with her dog, visiting with her daughters, and cooking.  Pt was pleasant and agreeable to therapy, interested in energy conservation strategies for increased safety and independence with ADL.  OTR provided various energy conservation strategies to assist in ADL and household management after discharge.  Pt is modified independent with all basic ADL, but is limited by limited endurance and increased work of breathing. OTR provided supervision assist for pt in bed mobility, sit to stand transfers, and grooming while standing at sink.  Pt with increased work of breathing at end of standing grooming, and SpO2 was 88% on 4L O2.  OTR provided verbal cues for pursed lip breathing and pacing, pt quickly recovered to 99% with rest.  Pt will benefit from home health OT for energy conservation strategies in her natural environment, as well as increased safety and independence in ADL.      Follow Up Recommendations  Home health OT    Equipment Recommendations  None recommended by OT    Recommendations for Other Services       Precautions / Restrictions Precautions Precautions: Fall Precaution Comments: monitor HR and O2 sats Restrictions Weight Bearing Restrictions: No      Mobility Bed Mobility Overal bed mobility: Modified Independent                 Transfers Overall transfer level: Needs assistance Equipment used: Rolling walker (2 wheeled) Transfers: Sit to/from Stand Sit to Stand: Supervision         General transfer comment: Cueing needed for pursed lip breathing and pacing    Balance Overall balance assessment: Needs assistance Sitting-balance support: No upper extremity supported;Feet supported Sitting balance-Leahy Scale: Good     Standing balance support: Single extremity supported Standing balance-Leahy Scale: Good Standing balance comment: Pt relied on sink for UE support when standing to brush teeth                           ADL either performed or assessed with clinical judgement   ADL Overall ADL's : Needs assistance/impaired                                       General ADL Comments: Pt is overall mod I for ADL and IADL, but is limited by shortness of breath and decreased activity tolerance.  Pt able to reach feet to don socks/shoes/pants and wash lower body.     Vision Patient Visual Report: No change from baseline       Perception     Praxis      Pertinent Vitals/Pain Pain Assessment: No/denies pain     Hand Dominance Right   Extremity/Trunk Assessment Upper Extremity Assessment Upper Extremity Assessment: Overall WFL for tasks assessed   Lower Extremity Assessment Lower Extremity Assessment: Overall [facility]  for tasks assessed   Cervical / Trunk Assessment Cervical / Trunk Assessment: Normal   Communication Communication Communication: No difficulties   Cognition Arousal/Alertness: Awake/alert Behavior During Therapy: WFL for tasks assessed/performed Overall Cognitive Status: Within Functional Limits for tasks assessed                                 General Comments: answering questions appropriately and aware of safety   General Comments  Pt with increasd work of breathing with activity.  SpO2 at 88% after standing at sink to brush  teeth, on 4L O2.  Pt's SpO2 recovered to 99% after a couple minutes of rest, with OTR providing verbal cues for deep breathing.    Exercises Other Exercises Other Exercises: provided education on fall and safety precautions, self care, energy conservation strategies, functional mobility, O2 management, and OT role and POC.  Pt denied any further questions. Other Exercises: Provided supervision assist for bed mobility, sit <> stand, grooming at sink   Shoulder Instructions      Home Living Family/patient expects to be discharged to:: Private residence Living Arrangements: Children Available Help at Discharge: Family;Available 24 hours/day Type of Home: House Home Access: Stairs to enter [employer] of Steps: 4 Entrance Stairs-Rails: Right;Left;Can reach both Home Layout: One level     Bathroom Shower/Tub: Tub/shower unit;Walk-in shower   Bathroom Toilet: Standard     Home Equipment: Environmental consultant - 2 wheels;Cane - single point          Prior Functioning/Environment Level of Independence: Independent        Comments: Indep with ADLs, household and community mobilization without assist device; home O2 at night; denies fall history.  Pt enjoys playing with her dog and cooking.  She was driving prior to admission.        OT Problem List: Decreased strength;Decreased range of motion;Decreased activity tolerance;Impaired balance (sitting and/or standing);Decreased safety awareness;Decreased knowledge of use of DME or AE;Cardiopulmonary status limiting activity      OT Treatment/Interventions: Self-care/ADL training;Therapeutic exercise;Energy conservation;DME and/or AE instruction;Therapeutic activities;Patient/family education;Balance training    OT Goals(Current goals can be found in the care plan section) Acute Rehab OT Goals Patient Stated Goal: to get strength back OT Goal Formulation: With patient Time For Goal Achievement: 07/29/19 Potential to Achieve Goals:  Good ADL Goals Pt Will Perform Lower Body Dressing: with modified independence;with adaptive equipment;sit to/from stand(with LRAD) Pt Will Transfer to Toilet: with modified independence;ambulating(with LRAD, comfort height toilet) Additional ADL Goal #1: Pt will independently verbalize x3 energy conservation strategies for increased safety and independence with ADL.  OT Frequency: Min 1X/week   Barriers to D/C:            Co-evaluation              AM-PAC OT "6 Clicks" Daily Activity     Outcome Measure Help from another person eating meals?: None Help from another person taking care of personal grooming?: None Help from another person toileting, which includes using toliet, bedpan, or urinal?: A Little Help from another person bathing (including washing, rinsing, drying)?: A Little Help from another person to put on and taking off regular upper body clothing?: None Help from another person to put on and taking off regular lower body clothing?: A Little 6 Click Score: 21   End of Session Equipment Utilized During Treatment: Gait belt  Activity Tolerance: Patient tolerated treatment well Patient left: in bed;with call  bell/phone within reach;with bed alarm set  OT Visit Diagnosis: Other abnormalities of gait and mobility (R26.89);Muscle weakness (generalized) (M62.81)                Time: 6606-3016 OT Time Calculation (min): 23 min Charges:  OT General Charges $OT Visit: 1 Visit OT Evaluation $OT Eval Moderate Complexity: 1 Mod OT Treatments $Self Care/Home Management : 8-22 mins  Myrtie Hawk Estes Lehner, OTR/L 07/15/19, 10:39 AM

## 2019-07-16 ENCOUNTER — Ambulatory Visit: Payer: Self-pay | Admitting: Physician Assistant

## 2019-07-16 ENCOUNTER — Ambulatory Visit: Payer: Self-pay | Admitting: *Deleted

## 2019-07-16 DIAGNOSIS — C7B8 Other secondary neuroendocrine tumors: Secondary | ICD-10-CM | POA: Diagnosis not present

## 2019-07-16 DIAGNOSIS — G4733 Obstructive sleep apnea (adult) (pediatric): Secondary | ICD-10-CM | POA: Diagnosis not present

## 2019-07-16 DIAGNOSIS — C3431 Malignant neoplasm of lower lobe, right bronchus or lung: Secondary | ICD-10-CM | POA: Diagnosis not present

## 2019-07-16 DIAGNOSIS — G2581 Restless legs syndrome: Secondary | ICD-10-CM | POA: Diagnosis not present

## 2019-07-16 DIAGNOSIS — I714 Abdominal aortic aneurysm, without rupture: Secondary | ICD-10-CM | POA: Diagnosis not present

## 2019-07-16 DIAGNOSIS — K219 Gastro-esophageal reflux disease without esophagitis: Secondary | ICD-10-CM | POA: Diagnosis not present

## 2019-07-16 DIAGNOSIS — K2270 Barrett's esophagus without dysplasia: Secondary | ICD-10-CM | POA: Diagnosis not present

## 2019-07-16 DIAGNOSIS — J9621 Acute and chronic respiratory failure with hypoxia: Secondary | ICD-10-CM | POA: Diagnosis not present

## 2019-07-16 DIAGNOSIS — M8580 Other specified disorders of bone density and structure, unspecified site: Secondary | ICD-10-CM | POA: Diagnosis not present

## 2019-07-16 DIAGNOSIS — D649 Anemia, unspecified: Secondary | ICD-10-CM | POA: Diagnosis not present

## 2019-07-16 DIAGNOSIS — J439 Emphysema, unspecified: Secondary | ICD-10-CM | POA: Diagnosis not present

## 2019-07-16 DIAGNOSIS — I10 Essential (primary) hypertension: Secondary | ICD-10-CM | POA: Diagnosis not present

## 2019-07-16 NOTE — Telephone Encounter (Signed)
Please advise 

## 2019-07-16 NOTE — Telephone Encounter (Signed)
Pt called with complaints of leg swelling that she noticed while she was in the hospital; the pt was hospitalized 07/09/19 - 07/15/19; she says the swelling is affects both legs L>R, and is from  calf to foot; the pt states that while she was in the hospital her lasix was decreased to 20 mg daily from twice daily (see visit 07/09/19); she is having shortness of breath more than usual since 07/10/19, and is to have "surgery on airway" (bronchosposcopy 07/23/19); recommendations made per nurse triage protocol; she verbalized understanding, but she would like to be seen in the office; the pt sees Fenton Malling, [facility]; spoke with Sharyn Lull and she requests that this information be sent to office for provider review.  Reason for Disposition . Patient sounds very sick or weak to the triager  Answer Assessment - Initial Assessment Questions 1. ONSET: "When did the swelling start?" (e.g., minutes, hours, days)     Around 07/09/19 when in hsopital 2. LOCATION: "What part of the leg is swollen?"  "Are both legs swollen or just one leg?"     L>R 3. SEVERITY: "How bad is the swelling?" (e.g., localized; mild, moderate, severe)  - Localized - small area of swelling localized to one leg  - MILD pedal edema - swelling limited to foot and ankle, pitting edema < 1/4 inch (6 mm) deep, rest and elevation eliminate most or all swelling  - MODERATE edema - swelling of lower leg to knee, pitting edema > 1/4 inch (6 mm) deep, rest and elevation only partially reduce swelling  - SEVERE edema - swelling extends above knee, facial or hand swelling present      Face is swollen (pt states she takes prednisone) 4. REDNESS: "Does the swelling look red or infected?"    no 5. PAIN: "Is the swelling painful to touch?" If so, ask: "How painful is it?"   (Scale 1-10; mild, moderate or severe)    no 6. FEVER: "Do you have a fever?" If so, ask: "What is it, how was it measured, and when did it start?"      no 7. CAUSE: "What  do you think is causing the leg swelling?"     Decreased dose of lasix 8. MEDICAL HISTORY: "Do you have a history of heart failure, kidney disease, liver failure, or cancer?"     Yes, cancer and CHF (recently d/c'd hosiptal) 232 lbs on 07/15/19; normally 252 lbs 9. RECURRENT SYMPTOM: "Have you had leg swelling before?" If so, ask: "When was the last time?" "What happened that time?"    Yes but not as bad 10. OTHER SYMPTOMS: "Do you have any other symptoms?" (e.g., chest pain, difficulty breathing)       Yes; pt to have airway surgery for correction of shortness of breath 11. PREGNANCY: "Is there any chance you are pregnant?" "When was your last menstrual period?"       no  Protocols used: LEG SWELLING AND EDEMA-A-AH

## 2019-07-16 NOTE — Progress Notes (Deleted)
Patient: Cathy Watts Female    DOB: 05/24/1945   74 y.o.   MRN: [id number] Visit Date: 07/16/2019  Today's Provider: Mar Daring, PA-C   No chief complaint on file.  Subjective:     HPI   Follow up Hospitalization  Patient was admitted to [facility] on 07/09/2019 and discharged on 07/15/2019 She was treated for SOB,Postobstructive pneumonia. Treatment for this included Continue on po levaquin. Marland Kitchen Respiratory pathogen panel is negative. Strep, legionella are both neg. Will need to follow up with Dr. Vernard Gambles on Friday 07/23/19 for bronchospy and stenting of her airwayas outpatient.  Telephone follow up was done on *** She reports good compliance with treatment. She reports this condition is {improved/worse/unchanged:[id number]}.  ------------------------------------------------------------------------------------    Pt with complaints of leg swelling that she noticed while she was in the hospital; the pt was hospitalized 07/09/19 - 07/15/19; she says the swelling is affects both legs L>R, and is from  calf to foot; the pt states that while she was in the hospital her lasix was decreased to 20 mg daily from twice daily (see visit 07/09/19); she is having shortness of breath more than usual since 07/10/19, and is to have "surgery on airway" (bronchosposcopy 07/23/19.  Allergies  Allergen Reactions  . Amoxicillin Hives and Itching    Did it involve swelling of the face/tongue/throat, SOB, or low BP? No Did it involve sudden or severe rash/hives, skin peeling, or any reaction on the inside of your mouth or nose? No Did you need to seek medical attention at a hospital or doctor's office? No When did it last happen?2018 If all above answers are "NO", may proceed with cephalosporin use.        Current Outpatient Medications:  .  apixaban (ELIQUIS) 5 MG TABS tablet, Take 1 tablet (5 mg total) by mouth 2 (two) times daily., Disp: 60 tablet, Rfl: 1 .  busPIRone  (BUSPAR) 30 MG tablet, Take 1 tablet by mouth twice daily (Patient taking differently: Take 30 mg by mouth 2 (two) times daily. ), Disp: 180 tablet, Rfl: 1 .  diltiazem (CARDIZEM CD) 360 MG 24 hr capsule, Take 1 capsule (360 mg total) by mouth daily., Disp: 30 capsule, Rfl: 0 .  escitalopram (LEXAPRO) 20 MG tablet, Take 1 tablet by mouth once daily (Patient taking differently: Take 20 mg by mouth daily. ), Disp: 90 tablet, Rfl: 1 .  Fluticasone-Umeclidin-Vilant (TRELEGY ELLIPTA) 100-62.5-25 MCG/INH AEPB, Inhale 1 puff into the lungs daily., Disp: 180 each, Rfl: 3 .  furosemide (LASIX) 20 MG tablet, Take 1 tablet (20 mg total) by mouth daily., Disp: 90 tablet, Rfl: 1 .  guaiFENesin-dextromethorphan (ROBITUSSIN DM) 100-10 MG/5ML syrup, Take 5 mLs by mouth every 4 (four) hours as needed for cough. (Patient not taking: Reported on 07/09/2019), Disp: 118 mL, Rfl: 0 .  ipratropium (ATROVENT HFA) 17 MCG/ACT inhaler, Inhale 2 puffs into the lungs 4 (four) times daily., Disp: 1 Inhaler, Rfl: 2 .  ipratropium-albuterol (DUONEB) 0.5-2.5 (3) MG/3ML SOLN, Take 3 mLs by nebulization every 6 (six) hours as needed. (Patient taking differently: Take 3 mLs by nebulization every 6 (six) hours as needed (shortness of breath). ), Disp: 360 mL, Rfl: 5 .  levofloxacin (LEVAQUIN) 750 MG tablet, Take 1 tablet (750 mg total) by mouth daily for 5 days., Disp: 5 tablet, Rfl: 0 .  Magnesium 250 MG TABS, Take 250 mg by mouth 2 (two) times daily. , Disp: , Rfl:  .  metoprolol  tartrate (LOPRESSOR) 50 MG tablet, Take 50 mg by mouth 2 (two) times daily., Disp: , Rfl:  .  nystatin (MYCOSTATIN) 100000 UNIT/ML suspension, Take 5 mLs (500,000 Units total) by mouth 4 (four) times daily. (Patient not taking: Reported on 07/09/2019), Disp: 240 mL, Rfl: 0 .  omeprazole (PRILOSEC) 40 MG capsule, Take 1 capsule by mouth once daily (Patient taking differently: Take 40 mg by mouth daily. ), Disp: 90 capsule, Rfl: 0 .  polyethylene glycol powder  (GLYCOLAX/MIRALAX) powder, TAKE 17 GRAMS OF POWDER MIXED IN 8 OUNCES OF WATER BY MOUTH ONCE A DAY. (Patient not taking: Reported on 07/09/2019), Disp: 255 g, Rfl: 3 .  potassium chloride SA (K-DUR) 20 MEQ tablet, Take 1 tablet (20 mEq total) by mouth daily. (Patient not taking: Reported on 07/09/2019), Disp: 30 tablet, Rfl: 3 .  pravastatin (PRAVACHOL) 20 MG tablet, TAKE 1 TABLET BY MOUTH AT BEDTIME (Patient taking differently: Take 20 mg by mouth daily. ), Disp: 90 tablet, Rfl: 1 .  PROAIR HFA 108 (90 Base) MCG/ACT inhaler, INHALE 2 PUFFS BY MOUTH EVERY 4 HOURS AS NEEDED FOR WHEEZING FOR SHORTNESS OF BREATH (Patient taking differently: Inhale 2 puffs into the lungs every 4 (four) hours as needed for wheezing or shortness of breath. ), Disp: 9 g, Rfl: 0 .  Respiratory Therapy Supplies (FLUTTER) DEVI, 1 each by Does not apply route QID., Disp: 1 each, Rfl: 0 .  rOPINIRole (REQUIP) 3 MG tablet, TAKE 1 TABLET BY MOUTH AT BEDTIME (Patient taking differently: Take 3 mg by mouth at bedtime. ), Disp: 90 tablet, Rfl: 1 .  sucralfate (CARAFATE) 1 g tablet, Take 1 g by mouth 3 (three) times daily. DISSOLVE IN THREE TO FOUR TABLESPOONFULS OF WARM WARM SWISH AND SWALLOW, Disp: , Rfl:   Review of Systems  Social History   Tobacco Use  . Smoking status: Former Smoker    Packs/day: 1.00    Years: 55.00    Pack years: 55.00    Types: Cigarettes    Quit date: 11/02/2017    Years since quitting: 1.7  . Smokeless tobacco: Never Used  Substance Use Topics  . Alcohol use: Yes    Alcohol/week: 0.0 standard drinks    Comment: occasionally- wine      Objective:   There were no vitals taken for this visit. There were no vitals filed for this visit.There is no height or weight on file to calculate BMI.   Physical Exam   No results found for any visits on 07/16/19.     Assessment & [place], PA-C  [facility]

## 2019-07-16 NOTE — Telephone Encounter (Signed)
Patient scheduled.

## 2019-07-16 NOTE — Telephone Encounter (Signed)
Spoke to pt and relayed below date/times.  Pt requested that I contact her daughter, Cathy Watts [facility]) and make her aware as well.  Cathy Watts is aware and voiced her understanding. Nothing further is needed.

## 2019-07-16 NOTE — Telephone Encounter (Signed)
She can be scheduled for appt today for leg swelling

## 2019-07-19 ENCOUNTER — Ambulatory Visit: Payer: Self-pay | Admitting: Physician Assistant

## 2019-07-19 ENCOUNTER — Other Ambulatory Visit: Payer: Self-pay

## 2019-07-19 ENCOUNTER — Emergency Department: Payer: Medicare Other

## 2019-07-19 ENCOUNTER — Inpatient Hospital Stay: Admit: 2019-07-19 | Payer: Medicare Other

## 2019-07-19 ENCOUNTER — Inpatient Hospital Stay
Admission: EM | Admit: 2019-07-19 | Discharge: 2019-07-23 | DRG: 177 | Disposition: A | Discharge status: Home Health | Payer: Medicare Other | Attending: Student | Admitting: Student

## 2019-07-19 ENCOUNTER — Telehealth: Payer: Self-pay | Admitting: Physician Assistant

## 2019-07-19 DIAGNOSIS — Z800 Family history of malignant neoplasm of digestive organs: Secondary | ICD-10-CM | POA: Diagnosis not present

## 2019-07-19 DIAGNOSIS — Z923 Personal history of irradiation: Secondary | ICD-10-CM

## 2019-07-19 DIAGNOSIS — R410 Disorientation, unspecified: Secondary | ICD-10-CM | POA: Diagnosis present

## 2019-07-19 DIAGNOSIS — M8580 Other specified disorders of bone density and structure, unspecified site: Secondary | ICD-10-CM | POA: Diagnosis not present

## 2019-07-19 DIAGNOSIS — U071 COVID-19: Secondary | ICD-10-CM | POA: Diagnosis not present

## 2019-07-19 DIAGNOSIS — E222 Syndrome of inappropriate secretion of antidiuretic hormone: Secondary | ICD-10-CM | POA: Diagnosis present

## 2019-07-19 DIAGNOSIS — Z743 Need for continuous supervision: Secondary | ICD-10-CM | POA: Diagnosis not present

## 2019-07-19 DIAGNOSIS — Z7901 Long term (current) use of anticoagulants: Secondary | ICD-10-CM

## 2019-07-19 DIAGNOSIS — J440 Chronic obstructive pulmonary disease with acute lower respiratory infection: Secondary | ICD-10-CM | POA: Diagnosis present

## 2019-07-19 DIAGNOSIS — J9621 Acute and chronic respiratory failure with hypoxia: Secondary | ICD-10-CM | POA: Diagnosis present

## 2019-07-19 DIAGNOSIS — I4891 Unspecified atrial fibrillation: Secondary | ICD-10-CM | POA: Diagnosis not present

## 2019-07-19 DIAGNOSIS — C3491 Malignant neoplasm of unspecified part of right bronchus or lung: Secondary | ICD-10-CM | POA: Diagnosis not present

## 2019-07-19 DIAGNOSIS — F411 Generalized anxiety disorder: Secondary | ICD-10-CM | POA: Diagnosis present

## 2019-07-19 DIAGNOSIS — Z515 Encounter for palliative care: Secondary | ICD-10-CM

## 2019-07-19 DIAGNOSIS — Z87891 Personal history of nicotine dependence: Secondary | ICD-10-CM

## 2019-07-19 DIAGNOSIS — R0989 Other specified symptoms and signs involving the circulatory and respiratory systems: Secondary | ICD-10-CM | POA: Diagnosis not present

## 2019-07-19 DIAGNOSIS — E785 Hyperlipidemia, unspecified: Secondary | ICD-10-CM | POA: Diagnosis present

## 2019-07-19 DIAGNOSIS — Z66 Do not resuscitate: Secondary | ICD-10-CM | POA: Diagnosis not present

## 2019-07-19 DIAGNOSIS — J441 Chronic obstructive pulmonary disease with (acute) exacerbation: Secondary | ICD-10-CM | POA: Diagnosis present

## 2019-07-19 DIAGNOSIS — Z9221 Personal history of antineoplastic chemotherapy: Secondary | ICD-10-CM | POA: Diagnosis not present

## 2019-07-19 DIAGNOSIS — Z8711 Personal history of peptic ulcer disease: Secondary | ICD-10-CM

## 2019-07-19 DIAGNOSIS — J9601 Acute respiratory failure with hypoxia: Secondary | ICD-10-CM | POA: Diagnosis not present

## 2019-07-19 DIAGNOSIS — K219 Gastro-esophageal reflux disease without esophagitis: Secondary | ICD-10-CM | POA: Diagnosis not present

## 2019-07-19 DIAGNOSIS — F29 Unspecified psychosis not due to a substance or known physiological condition: Secondary | ICD-10-CM | POA: Diagnosis present

## 2019-07-19 DIAGNOSIS — J189 Pneumonia, unspecified organism: Secondary | ICD-10-CM | POA: Diagnosis not present

## 2019-07-19 DIAGNOSIS — E878 Other disorders of electrolyte and fluid balance, not elsewhere classified: Secondary | ICD-10-CM | POA: Diagnosis present

## 2019-07-19 DIAGNOSIS — Z83511 Family history of glaucoma: Secondary | ICD-10-CM

## 2019-07-19 DIAGNOSIS — Z880 Allergy status to penicillin: Secondary | ICD-10-CM

## 2019-07-19 DIAGNOSIS — E871 Hypo-osmolality and hyponatremia: Secondary | ICD-10-CM | POA: Diagnosis not present

## 2019-07-19 DIAGNOSIS — Z803 Family history of malignant neoplasm of breast: Secondary | ICD-10-CM

## 2019-07-19 DIAGNOSIS — Z8041 Family history of malignant neoplasm of ovary: Secondary | ICD-10-CM

## 2019-07-19 DIAGNOSIS — Z9981 Dependence on supplemental oxygen: Secondary | ICD-10-CM

## 2019-07-19 DIAGNOSIS — F329 Major depressive disorder, single episode, unspecified: Secondary | ICD-10-CM | POA: Diagnosis present

## 2019-07-19 DIAGNOSIS — R0602 Shortness of breath: Secondary | ICD-10-CM | POA: Diagnosis not present

## 2019-07-19 DIAGNOSIS — G2581 Restless legs syndrome: Secondary | ICD-10-CM | POA: Diagnosis present

## 2019-07-19 DIAGNOSIS — J1282 Pneumonia due to coronavirus disease 2019: Secondary | ICD-10-CM | POA: Diagnosis present

## 2019-07-19 DIAGNOSIS — Z7189 Other specified counseling: Secondary | ICD-10-CM | POA: Diagnosis not present

## 2019-07-19 DIAGNOSIS — Z808 Family history of malignant neoplasm of other organs or systems: Secondary | ICD-10-CM

## 2019-07-19 DIAGNOSIS — I4892 Unspecified atrial flutter: Secondary | ICD-10-CM | POA: Diagnosis present

## 2019-07-19 DIAGNOSIS — R069 Unspecified abnormalities of breathing: Secondary | ICD-10-CM | POA: Diagnosis not present

## 2019-07-19 DIAGNOSIS — Z8261 Family history of arthritis: Secondary | ICD-10-CM

## 2019-07-19 DIAGNOSIS — Z8249 Family history of ischemic heart disease and other diseases of the circulatory system: Secondary | ICD-10-CM

## 2019-07-19 DIAGNOSIS — Z820 Family history of epilepsy and other diseases of the nervous system: Secondary | ICD-10-CM

## 2019-07-19 DIAGNOSIS — Z79899 Other long term (current) drug therapy: Secondary | ICD-10-CM

## 2019-07-19 DIAGNOSIS — Z833 Family history of diabetes mellitus: Secondary | ICD-10-CM

## 2019-07-19 LAB — OSMOLALITY: Osmolality: 273 mOsm/kg — ABNORMAL LOW (ref 275–295)

## 2019-07-19 LAB — RESPIRATORY PANEL BY RT PCR (FLU A&B, COVID)
Influenza A by PCR: NEGATIVE
Influenza B by PCR: NEGATIVE
SARS Coronavirus 2 by RT PCR: POSITIVE — AB

## 2019-07-19 LAB — CBC WITH DIFFERENTIAL/PLATELET
Abs Immature Granulocytes: 0.59 10*3/uL — ABNORMAL HIGH (ref 0.00–0.07)
Basophils Absolute: 0.1 10*3/uL (ref 0.0–0.1)
Basophils Relative: 1 %
Eosinophils Absolute: 0.2 10*3/uL (ref 0.0–0.5)
Eosinophils Relative: 2 %
HCT: 37.3 % (ref 36.0–46.0)
Hemoglobin: 11.7 g/dL — ABNORMAL LOW (ref 12.0–15.0)
Immature Granulocytes: 5 %
Lymphocytes Relative: 11 %
Lymphs Abs: 1.3 10*3/uL (ref 0.7–4.0)
MCH: 27.6 pg (ref 26.0–34.0)
MCHC: 31.4 g/dL (ref 30.0–36.0)
MCV: 88 fL (ref 80.0–100.0)
Monocytes Absolute: 1.8 10*3/uL — ABNORMAL HIGH (ref 0.1–1.0)
Monocytes Relative: 15 %
Neutro Abs: 8.4 10*3/uL — ABNORMAL HIGH (ref 1.7–7.7)
Neutrophils Relative %: 66 %
Platelets: 433 10*3/uL — ABNORMAL HIGH (ref 150–400)
RBC: 4.24 MIL/uL (ref 3.87–5.11)
RDW: 15.6 % — ABNORMAL HIGH (ref 11.5–15.5)
WBC: 12.5 10*3/uL — ABNORMAL HIGH (ref 4.0–10.5)
nRBC: 0 % (ref 0.0–0.2)

## 2019-07-19 LAB — BLOOD GAS, VENOUS
Acid-Base Excess: 4.4 mmol/L — ABNORMAL HIGH (ref 0.0–2.0)
Bicarbonate: 30.8 mmol/L — ABNORMAL HIGH (ref 20.0–28.0)
O2 Saturation: 62.1 %
Patient temperature: 37
pCO2, Ven: 52 mmHg (ref 44.0–60.0)
pH, Ven: 7.38 (ref 7.250–7.430)
pO2, Ven: 33 mmHg (ref 32.0–45.0)

## 2019-07-19 LAB — BASIC METABOLIC PANEL
Anion gap: 13 (ref 5–15)
BUN: 20 mg/dL (ref 8–23)
CO2: 25 mmol/L (ref 22–32)
Calcium: 8.8 mg/dL — ABNORMAL LOW (ref 8.9–10.3)
Chloride: 88 mmol/L — ABNORMAL LOW (ref 98–111)
Creatinine, Ser: 0.73 mg/dL (ref 0.44–1.00)
GFR calc Af Amer: >60 mL/min (ref >60)
GFR calc non Af Amer: >60 mL/min (ref >60)
Glucose, Bld: 138 mg/dL — ABNORMAL HIGH (ref 70–99)
Potassium: 4.4 mmol/L (ref 3.5–5.1)
Sodium: 126 mmol/L — ABNORMAL LOW (ref 135–145)

## 2019-07-19 LAB — MRSA PCR SCREENING: MRSA by PCR: NEGATIVE

## 2019-07-19 LAB — TSH: TSH: 1.286 u[IU]/mL (ref 0.350–4.500)

## 2019-07-19 LAB — TROPONIN I (HIGH SENSITIVITY)
Troponin I (High Sensitivity): 4 ng/L (ref <18)
Troponin I (High Sensitivity): 4 ng/L (ref <18)

## 2019-07-19 LAB — LACTIC ACID, PLASMA
Lactic Acid, Venous: 0.9 mmol/L (ref 0.5–1.9)
Lactic Acid, Venous: 1.5 mmol/L (ref 0.5–1.9)

## 2019-07-19 LAB — GLUCOSE, CAPILLARY: Glucose-Capillary: 166 mg/dL — ABNORMAL HIGH (ref 70–99)

## 2019-07-19 LAB — ABO/RH: ABO/RH(D): O POS

## 2019-07-19 LAB — BRAIN NATRIURETIC PEPTIDE: B Natriuretic Peptide: 155 pg/mL — ABNORMAL HIGH (ref 0.0–100.0)

## 2019-07-19 MED ORDER — DILTIAZEM HCL ER COATED BEADS 180 MG PO CP24
360.0000 mg | ORAL_CAPSULE | Freq: Every day | ORAL | Status: DC
  Administered 2019-07-19 – 2019-07-22 (×4): 360 mg via ORAL
  Filled 2019-07-19 (×5): qty 2

## 2019-07-19 MED ORDER — DEXAMETHASONE SODIUM PHOSPHATE 10 MG/ML IJ SOLN
6.0000 mg | INTRAMUSCULAR | Status: DC
  Administered 2019-07-19 – 2019-07-23 (×5): 6 mg via INTRAVENOUS
  Filled 2019-07-19 (×4): qty 0.6
  Filled 2019-07-19 (×2): qty 1

## 2019-07-19 MED ORDER — SODIUM CHLORIDE 0.9 % IV SOLN: 500.0000 mg | Freq: Once | INTRAVENOUS | Status: DC

## 2019-07-19 MED ORDER — MAGNESIUM HYDROXIDE 400 MG/5ML PO SUSP
30.0000 mL | Freq: Every day | ORAL | Status: DC | PRN
  Filled 2019-07-19: qty 30

## 2019-07-19 MED ORDER — BUSPIRONE HCL 15 MG PO TABS
30.0000 mg | ORAL_TABLET | Freq: Two times a day (BID) | ORAL | Status: DC
  Administered 2019-07-19 – 2019-07-23 (×9): 30 mg via ORAL
  Filled 2019-07-19 (×12): qty 2

## 2019-07-19 MED ORDER — MAGNESIUM OXIDE 400 (241.3 MG) MG PO TABS
200.0000 mg | ORAL_TABLET | Freq: Two times a day (BID) | ORAL | Status: DC
  Administered 2019-07-19 – 2019-07-23 (×9): 200 mg via ORAL
  Filled 2019-07-19 (×9): qty 1

## 2019-07-19 MED ORDER — ACETAMINOPHEN 325 MG PO TABS: 650.0000 mg | ORAL_TABLET | Freq: Four times a day (QID) | ORAL | Status: DC | PRN

## 2019-07-19 MED ORDER — FLUTTER DEVI: 1.0000 | Freq: Four times a day (QID) | Status: DC

## 2019-07-19 MED ORDER — POTASSIUM CHLORIDE CRYS ER 20 MEQ PO TBCR: 20.0000 meq | EXTENDED_RELEASE_TABLET | Freq: Every day | ORAL | Status: DC

## 2019-07-19 MED ORDER — SODIUM CHLORIDE 0.9 % IV SOLN: 250.0000 mL | INTRAVENOUS | Status: DC

## 2019-07-19 MED ORDER — ROPINIROLE HCL 1 MG PO TABS
3.0000 mg | ORAL_TABLET | Freq: Every day | ORAL | Status: DC
  Administered 2019-07-19 – 2019-07-22 (×4): 3 mg via ORAL
  Filled 2019-07-19 (×4): qty 3

## 2019-07-19 MED ORDER — METOPROLOL TARTRATE 50 MG PO TABS
50.0000 mg | ORAL_TABLET | Freq: Two times a day (BID) | ORAL | Status: DC
  Administered 2019-07-19 – 2019-07-20 (×3): 50 mg via ORAL
  Filled 2019-07-19 (×3): qty 1

## 2019-07-19 MED ORDER — LORAZEPAM 1 MG PO TABS
1.0000 mg | ORAL_TABLET | Freq: Four times a day (QID) | ORAL | Status: DC | PRN
  Administered 2019-07-19 – 2019-07-22 (×2): 1 mg via ORAL
  Filled 2019-07-19 (×2): qty 1

## 2019-07-19 MED ORDER — MOMETASONE FURO-FORMOTEROL FUM 100-5 MCG/ACT IN AERO
2.0000 | INHALATION_SPRAY | Freq: Two times a day (BID) | RESPIRATORY_TRACT | Status: DC
  Administered 2019-07-19 – 2019-07-23 (×9): 2 via RESPIRATORY_TRACT
  Filled 2019-07-19: qty 8.8

## 2019-07-19 MED ORDER — METHYLPREDNISOLONE SODIUM SUCC 125 MG IJ SOLR
125.0000 mg | Freq: Once | INTRAMUSCULAR | Status: AC
  Administered 2019-07-19: 125 mg via INTRAVENOUS
  Filled 2019-07-19: qty 2

## 2019-07-19 MED ORDER — SODIUM CHLORIDE 0.9 % IV SOLN
1.0000 g | Freq: Once | INTRAVENOUS | Status: AC
  Administered 2019-07-19: 1 g via INTRAVENOUS
  Filled 2019-07-19: qty 1

## 2019-07-19 MED ORDER — ALBUTEROL SULFATE HFA 108 (90 BASE) MCG/ACT IN AERS
2.0000 | INHALATION_SPRAY | RESPIRATORY_TRACT | Status: DC | PRN
  Administered 2019-07-20 – 2019-07-22 (×4): 2 via RESPIRATORY_TRACT
  Filled 2019-07-19: qty 6.7

## 2019-07-19 MED ORDER — ROPINIROLE HCL ER 4 MG PO TB24: 4.0000 mg | ORAL_TABLET | Freq: Every day | ORAL | Status: DC

## 2019-07-19 MED ORDER — IPRATROPIUM-ALBUTEROL 20-100 MCG/ACT IN AERS
1.0000 | INHALATION_SPRAY | Freq: Four times a day (QID) | RESPIRATORY_TRACT | Status: DC
  Administered 2019-07-19 – 2019-07-23 (×15): 1 via RESPIRATORY_TRACT
  Filled 2019-07-19: qty 4

## 2019-07-19 MED ORDER — SODIUM CHLORIDE 1 G PO TABS
1.0000 g | ORAL_TABLET | Freq: Two times a day (BID) | ORAL | Status: DC
  Administered 2019-07-19 – 2019-07-22 (×6): 1 g via ORAL
  Filled 2019-07-19 (×8): qty 1

## 2019-07-19 MED ORDER — DILTIAZEM HCL 25 MG/5ML IV SOLN
5.0000 mg | Freq: Once | INTRAVENOUS | Status: AC
  Administered 2019-07-19: 5 mg via INTRAVENOUS
  Filled 2019-07-19: qty 5

## 2019-07-19 MED ORDER — NOREPINEPHRINE 4 MG/250ML-% IV SOLN
2.0000 ug/min | INTRAVENOUS | Status: DC
  Administered 2019-07-19: 2 ug/min via INTRAVENOUS
  Filled 2019-07-19: qty 250

## 2019-07-19 MED ORDER — SODIUM CHLORIDE 0.9 % IV SOLN
100.0000 mg | Freq: Once | INTRAVENOUS | Status: AC
  Administered 2019-07-19: 100 mg via INTRAVENOUS
  Filled 2019-07-19: qty 100

## 2019-07-19 MED ORDER — ASPIRIN EC 81 MG PO TBEC
81.0000 mg | DELAYED_RELEASE_TABLET | Freq: Every day | ORAL | Status: DC
  Administered 2019-07-19 – 2019-07-23 (×5): 81 mg via ORAL
  Filled 2019-07-19 (×5): qty 1

## 2019-07-19 MED ORDER — IPRATROPIUM-ALBUTEROL 0.5-2.5 (3) MG/3ML IN SOLN
3.0000 mL | Freq: Once | RESPIRATORY_TRACT | Status: AC
  Administered 2019-07-19: 3 mL via RESPIRATORY_TRACT
  Filled 2019-07-19: qty 3

## 2019-07-19 MED ORDER — SUCRALFATE 1 G PO TABS
1.0000 g | ORAL_TABLET | Freq: Three times a day (TID) | ORAL | Status: DC
  Administered 2019-07-19 – 2019-07-23 (×13): 1 g via ORAL
  Filled 2019-07-19 (×13): qty 1

## 2019-07-19 MED ORDER — SODIUM CHLORIDE 0.9 % IV SOLN: INTRAVENOUS | Status: DC

## 2019-07-19 MED ORDER — VITAMIN D 25 MCG (1000 UNIT) PO TABS
1000.0000 [IU] | ORAL_TABLET | Freq: Every day | ORAL | Status: DC
  Administered 2019-07-19 – 2019-07-23 (×5): 1000 [IU] via ORAL
  Filled 2019-07-19 (×5): qty 1

## 2019-07-19 MED ORDER — APIXABAN 5 MG PO TABS
5.0000 mg | ORAL_TABLET | Freq: Two times a day (BID) | ORAL | Status: DC
  Administered 2019-07-19 – 2019-07-23 (×9): 5 mg via ORAL
  Filled 2019-07-19 (×9): qty 1

## 2019-07-19 MED ORDER — FAMOTIDINE 20 MG PO TABS
20.0000 mg | ORAL_TABLET | Freq: Two times a day (BID) | ORAL | Status: DC
  Administered 2019-07-19 – 2019-07-23 (×9): 20 mg via ORAL
  Filled 2019-07-19 (×9): qty 1

## 2019-07-19 MED ORDER — PRAVASTATIN SODIUM 20 MG PO TABS
20.0000 mg | ORAL_TABLET | Freq: Every day | ORAL | Status: DC
  Administered 2019-07-19 – 2019-07-22 (×4): 20 mg via ORAL
  Filled 2019-07-19 (×4): qty 1

## 2019-07-19 MED ORDER — GUAIFENESIN-DM 100-10 MG/5ML PO SYRP
5.0000 mL | ORAL_SOLUTION | ORAL | Status: DC | PRN
  Filled 2019-07-19: qty 5

## 2019-07-19 MED ORDER — LORAZEPAM 2 MG/ML IJ SOLN
INTRAMUSCULAR | Status: AC
  Administered 2019-07-19: 2 mg via INTRAVENOUS
  Filled 2019-07-19: qty 1

## 2019-07-19 MED ORDER — DILTIAZEM HCL-DEXTROSE 125-5 MG/125ML-% IV SOLN (PREMIX)
5.0000 mg/h | INTRAVENOUS | Status: DC
  Administered 2019-07-19: 5 mg/h via INTRAVENOUS
  Filled 2019-07-19: qty 125

## 2019-07-19 MED ORDER — ACETAMINOPHEN 500 MG PO TABS
1000.0000 mg | ORAL_TABLET | Freq: Once | ORAL | Status: AC
  Administered 2019-07-19: 1000 mg via ORAL
  Filled 2019-07-19: qty 2

## 2019-07-19 MED ORDER — DILTIAZEM HCL 25 MG/5ML IV SOLN
10.0000 mg | Freq: Once | INTRAVENOUS | Status: AC
  Administered 2019-07-19: 10 mg via INTRAVENOUS
  Filled 2019-07-19: qty 5

## 2019-07-19 MED ORDER — IPRATROPIUM-ALBUTEROL 0.5-2.5 (3) MG/3ML IN SOLN: 3.0000 mL | Freq: Four times a day (QID) | RESPIRATORY_TRACT | Status: DC | PRN

## 2019-07-19 MED ORDER — DILTIAZEM HCL ER COATED BEADS 180 MG PO CP24: 360.0000 mg | ORAL_CAPSULE | Freq: Every day | ORAL | Status: DC

## 2019-07-19 MED ORDER — SULFAMETHOXAZOLE-TRIMETHOPRIM 400-80 MG PO TABS
1.0000 | ORAL_TABLET | Freq: Two times a day (BID) | ORAL | Status: DC
  Administered 2019-07-19 – 2019-07-22 (×7): 1 via ORAL
  Filled 2019-07-19 (×9): qty 1

## 2019-07-19 MED ORDER — METOPROLOL TARTRATE 50 MG PO TABS: 50.0000 mg | ORAL_TABLET | Freq: Two times a day (BID) | ORAL | Status: DC

## 2019-07-19 MED ORDER — METHYLPREDNISOLONE SODIUM SUCC 40 MG IJ SOLR: 40.0000 mg | Freq: Three times a day (TID) | INTRAMUSCULAR | Status: DC

## 2019-07-19 MED ORDER — SODIUM CHLORIDE 0.9 % IV SOLN
200.0000 mg | Freq: Once | INTRAVENOUS | Status: AC
  Administered 2019-07-19: 200 mg via INTRAVENOUS
  Filled 2019-07-19: qty 40

## 2019-07-19 MED ORDER — ESCITALOPRAM OXALATE 10 MG PO TABS
20.0000 mg | ORAL_TABLET | Freq: Every day | ORAL | Status: DC
  Administered 2019-07-19 – 2019-07-23 (×5): 20 mg via ORAL
  Filled 2019-07-19 (×7): qty 2

## 2019-07-19 MED ORDER — MORPHINE SULFATE (PF) 2 MG/ML IV SOLN
1.0000 mg | INTRAVENOUS | Status: DC | PRN
  Administered 2019-07-19 – 2019-07-21 (×2): 2 mg via INTRAVENOUS
  Filled 2019-07-19 (×2): qty 1

## 2019-07-19 MED ORDER — ONDANSETRON HCL 4 MG/2ML IJ SOLN: 4.0000 mg | Freq: Four times a day (QID) | INTRAMUSCULAR | Status: DC | PRN

## 2019-07-19 MED ORDER — FUROSEMIDE 20 MG PO TABS
20.0000 mg | ORAL_TABLET | Freq: Every day | ORAL | Status: DC
  Administered 2019-07-19 – 2019-07-23 (×5): 20 mg via ORAL
  Filled 2019-07-19 (×5): qty 1

## 2019-07-19 MED ORDER — ONDANSETRON HCL 4 MG PO TABS: 4.0000 mg | ORAL_TABLET | Freq: Four times a day (QID) | ORAL | Status: DC | PRN

## 2019-07-19 MED ORDER — SODIUM CHLORIDE 0.9 % IV SOLN
100.0000 mg | Freq: Every day | INTRAVENOUS | Status: AC
  Administered 2019-07-20 – 2019-07-23 (×4): 100 mg via INTRAVENOUS
  Filled 2019-07-19: qty 100
  Filled 2019-07-19 (×3): qty 20
  Filled 2019-07-19: qty 100

## 2019-07-19 MED ORDER — LORAZEPAM 2 MG/ML IJ SOLN: 2.0000 mg | INTRAMUSCULAR | Status: DC

## 2019-07-19 MED ORDER — TRAZODONE HCL 50 MG PO TABS
25.0000 mg | ORAL_TABLET | Freq: Every evening | ORAL | Status: DC | PRN
  Administered 2019-07-19 – 2019-07-21 (×3): 25 mg via ORAL
  Filled 2019-07-19 (×3): qty 1

## 2019-07-19 MED ORDER — LORAZEPAM BOLUS VIA INFUSION: 2.0000 mg | INTRAVENOUS | Status: DC | PRN

## 2019-07-19 MED ORDER — APIXABAN 5 MG PO TABS: 5.0000 mg | ORAL_TABLET | Freq: Two times a day (BID) | ORAL | Status: DC

## 2019-07-19 MED ORDER — PANTOPRAZOLE SODIUM 40 MG PO TBEC
40.0000 mg | DELAYED_RELEASE_TABLET | Freq: Every day | ORAL | Status: DC
  Administered 2019-07-19 – 2019-07-23 (×5): 40 mg via ORAL
  Filled 2019-07-19 (×5): qty 1

## 2019-07-19 NOTE — ED Triage Notes (Signed)
PT to ED via EMS from home c/o increased SHOB. PT with hx of lung cancer, copd, and currently on ABX for PNA. When EMS arrived pt was 66% on her 2L [place]. EMS gave 2 duonebs. EMS attempted to give solumedrol but pt reports her doctor told her to quit taking that. PT arrived on NRB satting 97%, able to speak in almost full sentences.

## 2019-07-19 NOTE — ED Provider Notes (Signed)
[facility] Emergency Department Provider Note   ____________________________________________   First MD Initiated Contact with Patient 07/19/19 0118     (approximate)  I have reviewed the triage vital signs and the nursing notes.   HISTORY  Chief Complaint Respiratory Distress    HPI Cathy Watts is a 74 y.o. female with past medical history of lung cancer, atrial flutter on Eliquis, COPD on 2L, AAA, presents to the ED for shortness of breath.  Patient reports that she has had increased difficulty breathing over the past couple of days but it seemed to acutely get worse this evening.  When EMS arrived, they found her to have O2 sats of 66% on her usual 2 L nasal cannula.  She was given 2 duo nebs with improvement, but patient continued to have O2 sats in the 80s on nonrebreather.  She did not receive steroids with EMS as she was recently told to stop these.  She has been coughing lately, but denies any fevers or chest pain.  She has not noticed any increase swelling in her legs.  She was recently admitted for similar symptoms, when she was found to have a postobstructive pneumonia and scheduled for bronchial stenting later this week.        Past Medical History:  Diagnosis Date  . Abdominal aortic aneurysm (AAA) 3.0 cm to 5.0 cm in diameter in female [facility]) 05/2018   being followed by dr. Delana Meyer. will reultrasound in 1 year  . Anxiety   . Asthma   . Barrett's esophagus   . COPD (chronic obstructive pulmonary disease) ([place])   . Depression   . GERD (gastroesophageal reflux disease)   . History of peptic ulcer disease   . Hypercholesterolemia   . Low oxygen saturation    2l hs  . Osteopenia   . Panic disorder   . Personal history of radiation therapy   . Personal history of tobacco use, presenting hazards to health 01/16/2015  . Requires supplemental oxygen 05/2018   supposed to use 2L NP at night but she currently does not.  encouraged patient  to resume this  . Sleep apnea    snores significantly but has never been tested for OSA  . SOB (shortness of breath)   . Squamous cell carcinoma of right lung [facility]) 2015    Patient Active Problem List   Diagnosis Date Noted  . Postobstructive pneumonia 07/09/2019  . Atrial flutter with rapid ventricular response ([place]) 06/17/2019  . Left leg swelling 05/14/2018  . Elevated hemoglobin A1c 05/02/2018  . Abdominal aortic aneurysm (AAA) 3.0 cm to 5.0 cm in diameter in female ([place]) 06/30/2017  . Abdominal aortic atherosclerosis ([place]) 06/30/2017  . Chronic venous insufficiency 04/11/2016  . Varicose veins of bilateral lower extremities with pain 03/12/2016  . Cancer of lower lobe of right lung ([place]) 10/09/2015  . Oropharyngeal candidiasis 07/12/2015  . Malignant neoplasm of hilus of left lung ([place])   . Personal history of tobacco use, presenting hazards to health 01/16/2015  . Airway hyperreactivity 11/15/2014  . CN (constipation) 11/15/2014  . COPD with acute exacerbation ([place]) 11/15/2014  . Daytime somnolence 11/15/2014  . Clinical depression 11/15/2014  . DD (diverticular disease) 11/15/2014  . Accumulation of fluid in tissues 11/15/2014  . H/O peptic ulcer 11/15/2014  . Osteopenia 11/15/2014  . Awareness of heartbeats 11/15/2014  . Episodic paroxysmal anxiety disorder 11/15/2014  . Apnea, sleep 11/15/2014  . Snores 11/15/2014  . Breath shortness 11/15/2014  . Compulsive  tobacco user syndrome 11/15/2014  . Avitaminosis D 11/15/2014  . Barrett esophagus 10/19/2014  . Acid reflux 10/19/2014  . COPD, moderate ([place]) 08/31/2013  . Dyspnea 08/31/2013  . Otitis, externa, infective 08/31/2013  . Nocturnal hypoxemia 08/31/2013  . Hypercholesterolemia without hypertriglyceridemia 11/05/2005    Past Surgical History:  Procedure Laterality Date  . CERVICAL CONE BIOPSY  1990  . CHOLECYSTECTOMY  1980's  . colonoscopy  2014  . COLONOSCOPY  07/21/2012  . COLONOSCOPY WITH PROPOFOL N/A  05/12/2018   Procedure: COLONOSCOPY WITH PROPOFOL;  Surgeon: Lollie Sails, MD;  Location: [facility] ENDOSCOPY;  Service: Endoscopy;  Laterality: N/A;  . ELBOW FRACTURE SURGERY Right 2009  . ENDOBRONCHIAL ULTRASOUND N/A 02/07/2015   Procedure: ENDOBRONCHIAL ULTRASOUND;  Surgeon: Flora Lipps, MD;  Location: [facility] ORS;  Service: Cardiopulmonary;  Laterality: N/A;  . ENDOBRONCHIAL ULTRASOUND Left 06/05/2018   Procedure: ENDOBRONCHIAL ULTRASOUND;  Surgeon: Flora Lipps, MD;  Location: [facility] ORS;  Service: Cardiopulmonary;  Laterality: Left;  . ESOPHAGOGASTRODUODENOSCOPY (EGD) WITH PROPOFOL N/A 02/26/2016   Procedure: ESOPHAGOGASTRODUODENOSCOPY (EGD) WITH PROPOFOL;  Surgeon: Lollie Sails, MD;  Location: [facility] ENDOSCOPY;  Service: Endoscopy;  Laterality: N/A;  COPD, Sleep Apnea  . ESOPHAGOGASTRODUODENOSCOPY (EGD) WITH PROPOFOL N/A 05/12/2018   Procedure: ESOPHAGOGASTRODUODENOSCOPY (EGD) WITH PROPOFOL;  Surgeon: Lollie Sails, MD;  Location: [facility] ENDOSCOPY;  Service: Endoscopy;  Laterality: N/A;  . EYE SURGERY Bilateral    cataract extractions  . FRACTURE SURGERY  2005   elbow repair  . PORTA CATH INSERTION N/A 06/17/2018   Procedure: PORTA CATH INSERTION;  Surgeon: Katha Cabal, MD;  Location: [place] CV LAB;  Service: Cardiovascular;  Laterality: N/A;  . TONSILLECTOMY  1979  . TUBAL LIGATION  1970's  . UPPER GI ENDOSCOPY      Prior to Admission medications   Medication Sig Start Date End Date Taking? Authorizing Provider  apixaban (ELIQUIS) 5 MG TABS tablet Take 1 tablet (5 mg total) by mouth 2 (two) times daily. 06/19/19   Ezekiel Slocumb, DO  busPIRone (BUSPAR) 30 MG tablet Take 1 tablet by mouth twice daily Patient taking differently: Take 30 mg by mouth 2 (two) times daily.  05/17/19   Mar Daring, PA-C  diltiazem (CARDIZEM CD) 360 MG 24 hr capsule Take 1 capsule (360 mg total) by mouth daily. 07/16/19 08/15/19  Wyvonnia Dusky, MD  escitalopram (LEXAPRO) 20 MG  tablet Take 1 tablet by mouth once daily Patient taking differently: Take 20 mg by mouth daily.  05/17/19   Mar Daring, PA-C  Fluticasone-Umeclidin-Vilant (TRELEGY ELLIPTA) 100-62.5-25 MCG/INH AEPB Inhale 1 puff into the lungs daily. 12/28/18   Flora Lipps, MD  furosemide (LASIX) 20 MG tablet Take 1 tablet (20 mg total) by mouth daily. 01/20/19   Mar Daring, PA-C  guaiFENesin-dextromethorphan (ROBITUSSIN DM) 100-10 MG/5ML syrup Take 5 mLs by mouth every 4 (four) hours as needed for cough. Patient not taking: Reported on 07/09/2019 05/27/19   Flora Lipps, MD  ipratropium (ATROVENT HFA) 17 MCG/ACT inhaler Inhale 2 puffs into the lungs 4 (four) times daily. 06/19/19 06/18/20  Nicole Kindred A, DO  ipratropium-albuterol (DUONEB) 0.5-2.5 (3) MG/3ML SOLN Take 3 mLs by nebulization every 6 (six) hours as needed. Patient taking differently: Take 3 mLs by nebulization every 6 (six) hours as needed (shortness of breath).  07/02/19   Mar Daring, PA-C  levofloxacin (LEVAQUIN) 750 MG tablet Take 1 tablet (750 mg total) by mouth daily for 5 days. 07/16/19 07/21/19  Wyvonnia Dusky,  MD  Magnesium 250 MG TABS Take 250 mg by mouth 2 (two) times daily.     [provider]  metoprolol tartrate (LOPRESSOR) 50 MG tablet Take 50 mg by mouth 2 (two) times daily. 06/23/19   [provider]  nystatin (MYCOSTATIN) 100000 UNIT/ML suspension Take 5 mLs (500,000 Units total) by mouth 4 (four) times daily. Patient not taking: Reported on 07/09/2019 06/21/19   Mar Daring, PA-C  omeprazole (PRILOSEC) 40 MG capsule Take 1 capsule by mouth once daily Patient taking differently: Take 40 mg by mouth daily.  04/23/19   Mar Daring, PA-C  polyethylene glycol powder (GLYCOLAX/MIRALAX) powder TAKE 17 GRAMS OF POWDER MIXED IN 8 OUNCES OF WATER BY MOUTH ONCE A DAY. Patient not taking: Reported on 07/09/2019 05/02/17   Mar Daring, PA-C  potassium chloride SA (K-DUR) 20 MEQ  tablet Take 1 tablet (20 mEq total) by mouth daily. Patient not taking: Reported on 07/09/2019 10/22/18   Mar Daring, PA-C  pravastatin (PRAVACHOL) 20 MG tablet TAKE 1 TABLET BY MOUTH AT BEDTIME Patient taking differently: Take 20 mg by mouth daily.  06/14/19   Mar Daring, PA-C  PROAIR HFA 108 (90 Base) MCG/ACT inhaler INHALE 2 PUFFS BY MOUTH EVERY 4 HOURS AS NEEDED FOR WHEEZING FOR SHORTNESS OF BREATH Patient taking differently: Inhale 2 puffs into the lungs every 4 (four) hours as needed for wheezing or shortness of breath.  06/15/19   Flora Lipps, MD  Respiratory Therapy Supplies (FLUTTER) DEVI 1 each by Does not apply route QID. 03/31/18   Flora Lipps, MD  rOPINIRole (REQUIP) 3 MG tablet TAKE 1 TABLET BY MOUTH AT BEDTIME Patient taking differently: Take 3 mg by mouth at bedtime.  02/12/19   Mar Daring, PA-C  sucralfate (CARAFATE) 1 g tablet Take 1 g by mouth 3 (three) times daily. DISSOLVE IN THREE TO FOUR TABLESPOONFULS OF WARM WARM SWISH AND SWALLOW 06/22/19   [provider]    Allergies Amoxicillin  Family History  Problem Relation Age of Onset  . Colon cancer Mother        colon  . Diabetes Mother   . Arthritis Mother   . Glaucoma Mother   . Stomach cancer Father        stomach  . Throat cancer Brother        throat  . Breast cancer Sister        breast  . Diabetes Sister   . Cirrhosis Sister   . Cancer Paternal Aunt   . Parkinson's disease Sister   . Heart attack Sister   . Ovarian cancer Sister     Social History Social History   Tobacco Use  . Smoking status: Former Smoker    Packs/day: 1.00    Years: 55.00    Pack years: 55.00    Types: Cigarettes    Quit date: 11/02/2017    Years since quitting: 1.7  . Smokeless tobacco: Never Used  Substance Use Topics  . Alcohol use: Yes    Alcohol/week: 0.0 standard drinks    Comment: occasionally- wine  . Drug use: No    Review of Systems  Constitutional: No fever/chills Eyes:  No visual changes. ENT: No sore throat. Cardiovascular: Denies chest pain. Respiratory: Positive for cough and shortness of breath. Gastrointestinal: No abdominal pain.  No nausea, no vomiting.  No diarrhea.  No constipation. Genitourinary: Negative for dysuria. Musculoskeletal: Negative for back pain. Skin: Negative for rash. Neurological: Negative for headaches, focal  weakness or numbness.  ____________________________________________   PHYSICAL EXAM:  VITAL SIGNS: ED Triage Vitals  Enc Vitals Group     BP 07/19/19 0121 (!) 140/102     Pulse Rate 07/19/19 0121 (!) 119     Resp 07/19/19 0121 (!) 24     Temp 07/19/19 0121 98.7 F (37.1 C)     Temp Source 07/19/19 0121 Axillary     SpO2 07/19/19 0121 99 %     Weight 07/19/19 0123 232 lb (105.2 kg)     Height 07/19/19 0123 5\' 10"  (1.778 m)     Head Circumference --      Peak Flow --      Pain Score 07/19/19 0122 0     Pain Loc --      Pain Edu? --      Excl. in [place]? --     Constitutional: Alert and oriented. Eyes: Conjunctivae are normal. Head: Atraumatic. Nose: No congestion/rhinnorhea. Mouth/Throat: Mucous membranes are moist. Neck: Normal ROM Cardiovascular: Normal rate, regular rhythm. Grossly normal heart sounds. Respiratory: Moderate respiratory distress with tachypnea.  No retractions. Lungs with poor air movement throughout, inspiratory and expiratory wheezing noted. Gastrointestinal: Soft and nontender. No distention. Genitourinary: deferred Musculoskeletal: No lower extremity tenderness nor edema. Neurologic:  Normal speech and language. No gross focal neurologic deficits are appreciated. Skin:  Skin is warm, dry and intact. No rash noted. Psychiatric: Mood and affect are normal. Speech and behavior are normal.  ____________________________________________   LABS (all labs ordered are listed, but only abnormal results are displayed)  Labs Reviewed  BLOOD GAS, VENOUS - Abnormal; Notable for the following  components:      Result Value   Bicarbonate 30.8 (*)    Acid-Base Excess 4.4 (*)    All other components within normal limits  BASIC METABOLIC PANEL - Abnormal; Notable for the following components:   Sodium 126 (*)    Chloride 88 (*)    Glucose, Bld 138 (*)    Calcium 8.8 (*)    All other components within normal limits  CBC WITH DIFFERENTIAL/PLATELET - Abnormal; Notable for the following components:   WBC 12.5 (*)    Hemoglobin 11.7 (*)    RDW 15.6 (*)    Platelets 433 (*)    Neutro Abs 8.4 (*)    Monocytes Absolute 1.8 (*)    Abs Immature Granulocytes 0.59 (*)    All other components within normal limits  BRAIN NATRIURETIC PEPTIDE - Abnormal; Notable for the following components:   B Natriuretic Peptide 155.0 (*)    All other components within normal limits  CULTURE, BLOOD (ROUTINE X 2)  CULTURE, BLOOD (ROUTINE X 2)  RESPIRATORY PANEL BY RT PCR (FLU A&B, COVID)  LACTIC ACID, PLASMA  LACTIC ACID, PLASMA  TROPONIN I (HIGH SENSITIVITY)  TROPONIN I (HIGH SENSITIVITY)   ____________________________________________  EKG  ED ECG REPORT I, Blake Divine, the attending physician, personally viewed and interpreted this ECG.   Date: 07/19/2019  EKG Time: 1:21  Rate: 123  Rhythm: atrial fibrillation, rate 123  Axis: Normal  Intervals:none  ST&T Change: None    PROCEDURES  Procedure(s) performed (including Critical Care):  .Critical Care Performed by: Blake Divine, MD Authorized by: Blake Divine, MD   Critical care provider statement:    Critical care time (minutes):  45   Critical care time was exclusive of:  Separately billable procedures and treating other patients and teaching time   Critical care was necessary to treat or prevent imminent  or life-threatening deterioration of the following conditions:  Respiratory failure   Critical care was time spent personally by me on the following activities:  Discussions with consultants, evaluation of patient's  response to treatment, examination of patient, ordering and performing treatments and interventions, ordering and review of laboratory studies, ordering and review of radiographic studies, pulse oximetry, re-evaluation of patient's condition, obtaining history from patient or surrogate and review of old charts   I assumed direction of critical care for this patient from another provider in my specialty: no    .1-3 Lead EKG Interpretation Performed by: Blake Divine, MD Authorized by: Blake Divine, MD     Interpretation: abnormal     ECG rate:  124   ECG rate assessment: tachycardic     Rhythm: atrial fibrillation     Ectopy: none     Conduction: normal       ____________________________________________   INITIAL IMPRESSION / ASSESSMENT AND PLAN / ED COURSE       74 year old female with history of obstructive lung mass and recent postobstructive pneumonia presents to the ED for increased difficulty breathing over the past couple of days, noted to have O2 sats in the 60s on her usual 2 L nasal cannula by EMS.  She arrives in some respiratory distress, although per EMS has significantly improved following 2 DuoNeb's.  Wheezing on exam is concerning for COPD exacerbation and while she had initially refused steroids, she was agreeable here to receive Solu-Medrol.  She also received an additional DuoNeb and work of breathing continues to improve.  Chest x-ray was concerning for new opacity on the right and given she is on Levaquin, will broaden antibiotics with cefepime and doxycycline rather than azithromycin given her prolonged QT.  Lab work is reassuring and VBG shows no evidence of hypercapnia to necessitate BiPAP.  EKG without acute ischemic changes and troponin within normal limits, doubt primary cardiac process.  Case was discussed with hospitalist, who accepts patient for admission.      ____________________________________________   FINAL CLINICAL IMPRESSION(S) / ED  DIAGNOSES  Final diagnoses:  COPD exacerbation ([place])  Atrial fibrillation with RVR ([place])  Acute on chronic respiratory failure with hypoxia [facility])     ED Discharge Orders    None       Note:  This document was prepared using Dragon voice recognition software and may include unintentional dictation errors.   Blake Divine, MD 07/19/19 [phone]

## 2019-07-19 NOTE — Progress Notes (Signed)
CSW called Educational psychologist. Confirmed patient is currently active with Home Health RN services through them.   Oleh Genin, [place]

## 2019-07-19 NOTE — Telephone Encounter (Signed)
Cathy Watts from [facility] home [facility] advised as directed below.

## 2019-07-19 NOTE — Progress Notes (Signed)
1040 Patient called out. States she cant breathe. Before nurse could dress (patient is Covid 19 positive - Contact and Airborne Isolation) and get in room patient was sitting on side of bed trying to get up, screaming she couldn't breathe. Dr. Lanney Gins at door as well as RT dressing to enter room to assist patient. Orders received. Patient given mg of Lorazepam and assisted into bed. Placed on 34% HF Oxygen 40 L.per RT. Patient calmed immediatedly and went to sleep. Oxygen saturations now 97%. Oxygen saturations during her shortness of breath episodes were 95% per the monitor.

## 2019-07-19 NOTE — ED Notes (Signed)
Report from [place], rn.

## 2019-07-19 NOTE — Telephone Encounter (Signed)
Yes this will be ok, but patient is currently hospitalized again

## 2019-07-19 NOTE — Consult Note (Signed)
CARDIOLOGY CONSULT NOTE               Patient ID: Cathy Watts MRN: [id number] DOB/AGE: [birth year] 74 y.o.  Admit date: 07/19/2019 Referring Physician Dr. Eugenie Norrie  Primary Physician Fenton Malling, PA-C Primary Cardiologist Dr. Nehemiah Massed  Reason for Consultation Atrial flutter  HPI: Cathy Watts is a 74 year old female with a past medical history significant for paroxsymal atrial flutter, anticoagulated with Eliquis, a AAA, squamous cell carcinoma of the right lung, oxygen dependent COPD, hyperlipidemia, and GERD who presented to the ED on 07/19/19 for a few day history of progressive shortness of breath and a productive cough.  Upon EMS arrival, O2 sats were noted to be 66% on 2L .  Workup in the ED was significant for COVID-19 positive, ECG revealing atrial flutter at a rate of 127bpm, BNP of 155, high sensitivity troponin negative at 4, sodium of 126, and WBC of 12.5.  She was given 10mg  IV Cardizem infusion, doxycycline, cefepime, DuoNeb's, 125mg  of IV Solumedrol and admitted for further evaluation and treatment.    Cathy Watts recently established care with Dr. Nehemiah Massed.  Echocardiogram on 07/14/19 revealed mild LV systolic dysfunction with an EF estimated between 40-45% with mild LVH; apical and septal hypokinesis noted  Review of systems complete and found to be negative unless listed above     Past Medical History:  Diagnosis Date  . Abdominal aortic aneurysm (AAA) 3.0 cm to 5.0 cm in diameter in female [facility]) 05/2018   being followed by dr. Delana Meyer. will reultrasound in 1 year  . Anxiety   . Asthma   . Barrett's esophagus   . COPD (chronic obstructive pulmonary disease) ([place])   . Depression   . GERD (gastroesophageal reflux disease)   . History of peptic ulcer disease   . Hypercholesterolemia   . Low oxygen saturation    2l hs  . Osteopenia   . Panic disorder   . Personal history of radiation therapy   . Personal history of tobacco use, presenting hazards  to health 01/16/2015  . Requires supplemental oxygen 05/2018   supposed to use 2L NP at night but she currently does not.  encouraged patient to resume this  . Sleep apnea    snores significantly but has never been tested for OSA  . SOB (shortness of breath)   . Squamous cell carcinoma of right lung ([place]) 2015    Past Surgical History:  Procedure Laterality Date  . CERVICAL CONE BIOPSY  1990  . CHOLECYSTECTOMY  1980's  . colonoscopy  2014  . COLONOSCOPY  07/21/2012  . COLONOSCOPY WITH PROPOFOL N/A 05/12/2018   Procedure: COLONOSCOPY WITH PROPOFOL;  Surgeon: Lollie Sails, MD;  Location: [facility] ENDOSCOPY;  Service: Endoscopy;  Laterality: N/A;  . ELBOW FRACTURE SURGERY Right 2009  . ENDOBRONCHIAL ULTRASOUND N/A 02/07/2015   Procedure: ENDOBRONCHIAL ULTRASOUND;  Surgeon: Flora Lipps, MD;  Location: [facility] ORS;  Service: Cardiopulmonary;  Laterality: N/A;  . ENDOBRONCHIAL ULTRASOUND Left 06/05/2018   Procedure: ENDOBRONCHIAL ULTRASOUND;  Surgeon: Flora Lipps, MD;  Location: [facility] ORS;  Service: Cardiopulmonary;  Laterality: Left;  . ESOPHAGOGASTRODUODENOSCOPY (EGD) WITH PROPOFOL N/A 02/26/2016   Procedure: ESOPHAGOGASTRODUODENOSCOPY (EGD) WITH PROPOFOL;  Surgeon: Lollie Sails, MD;  Location: [facility] ENDOSCOPY;  Service: Endoscopy;  Laterality: N/A;  COPD, Sleep Apnea  . ESOPHAGOGASTRODUODENOSCOPY (EGD) WITH PROPOFOL N/A 05/12/2018   Procedure: ESOPHAGOGASTRODUODENOSCOPY (EGD) WITH PROPOFOL;  Surgeon: Lollie Sails, MD;  Location: [facility] ENDOSCOPY;  Service: Endoscopy;  Laterality: N/A;  .  EYE SURGERY Bilateral    cataract extractions  . FRACTURE SURGERY  2005   elbow repair  . PORTA CATH INSERTION N/A 06/17/2018   Procedure: PORTA CATH INSERTION;  Surgeon: Katha Cabal, MD;  Location: [place] CV LAB;  Service: Cardiovascular;  Laterality: N/A;  . TONSILLECTOMY  1979  . TUBAL LIGATION  1970's  . UPPER GI ENDOSCOPY      Medications Prior to Admission  Medication Sig Dispense  Refill Last Dose  . apixaban (ELIQUIS) 5 MG TABS tablet Take 1 tablet (5 mg total) by mouth 2 (two) times daily. 60 tablet 1 07/18/2019 at 2000  . busPIRone (BUSPAR) 30 MG tablet Take 1 tablet by mouth twice daily (Patient taking differently: Take 30 mg by mouth 2 (two) times daily. ) 180 tablet 1 07/18/2019 at Unknown time  . diltiazem (CARDIZEM CD) 360 MG 24 hr capsule Take 1 capsule (360 mg total) by mouth daily. 30 capsule 0 07/18/2019 at Unknown time  . escitalopram (LEXAPRO) 20 MG tablet Take 1 tablet by mouth once daily (Patient taking differently: Take 20 mg by mouth daily. ) 90 tablet 1 07/18/2019 at Unknown time  . Fluticasone-Umeclidin-Vilant (TRELEGY ELLIPTA) 100-62.5-25 MCG/INH AEPB Inhale 1 puff into the lungs daily. 180 each 3 07/18/2019 at Unknown time  . furosemide (LASIX) 20 MG tablet Take 1 tablet (20 mg total) by mouth daily. 90 tablet 1 07/18/2019 at Unknown time  . ipratropium (ATROVENT HFA) 17 MCG/ACT inhaler Inhale 2 puffs into the lungs 4 (four) times daily. 1 Inhaler 2 07/18/2019 at Unknown time  . ipratropium-albuterol (DUONEB) 0.5-2.5 (3) MG/3ML SOLN Take 3 mLs by nebulization every 6 (six) hours as needed. (Patient taking differently: Take 3 mLs by nebulization every 6 (six) hours as needed (shortness of breath). ) 360 mL 5 07/18/2019 at Unknown time  . levofloxacin (LEVAQUIN) 750 MG tablet Take 1 tablet (750 mg total) by mouth daily for 5 days. 5 tablet 0 07/18/2019 at Unknown time  . Magnesium 250 MG TABS Take 250 mg by mouth 2 (two) times daily.    07/18/2019 at Unknown time  . metoprolol tartrate (LOPRESSOR) 50 MG tablet Take 50 mg by mouth 2 (two) times daily.   07/18/2019 at Unknown time  . omeprazole (PRILOSEC) 40 MG capsule Take 1 capsule by mouth once daily (Patient taking differently: Take 40 mg by mouth daily. ) 90 capsule 0 07/18/2019 at Unknown time  . pravastatin (PRAVACHOL) 20 MG tablet TAKE 1 TABLET BY MOUTH AT BEDTIME (Patient taking differently: Take 20 mg by mouth  daily. ) 90 tablet 1 07/18/2019 at Unknown time  . PROAIR HFA 108 (90 Base) MCG/ACT inhaler INHALE 2 PUFFS BY MOUTH EVERY 4 HOURS AS NEEDED FOR WHEEZING FOR SHORTNESS OF BREATH (Patient taking differently: Inhale 2 puffs into the lungs every 4 (four) hours as needed for wheezing or shortness of breath. ) 9 g 0 07/18/2019 at Unknown time  . rOPINIRole (REQUIP) 3 MG tablet TAKE 1 TABLET BY MOUTH AT BEDTIME (Patient taking differently: Take 3 mg by mouth at bedtime. ) 90 tablet 1 07/18/2019 at Unknown time  . sucralfate (CARAFATE) 1 g tablet Take 1 g by mouth 3 (three) times daily. DISSOLVE IN THREE TO FOUR TABLESPOONFULS OF WARM WARM SWISH AND SWALLOW   07/18/2019 at Unknown time  . guaiFENesin-dextromethorphan (ROBITUSSIN DM) 100-10 MG/5ML syrup Take 5 mLs by mouth every 4 (four) hours as needed for cough. (Patient not taking: Reported on 07/09/2019) 118 mL 0 Not Taking at  Unknown time  . nystatin (MYCOSTATIN) 100000 UNIT/ML suspension Take 5 mLs (500,000 Units total) by mouth 4 (four) times daily. (Patient not taking: Reported on 07/09/2019) 240 mL 0 Not Taking at Unknown time  . polyethylene glycol powder (GLYCOLAX/MIRALAX) powder TAKE 17 GRAMS OF POWDER MIXED IN 8 OUNCES OF WATER BY MOUTH ONCE A DAY. (Patient not taking: Reported on 07/09/2019) 255 g 3 Not Taking at Unknown time  . potassium chloride SA (K-DUR) 20 MEQ tablet Take 1 tablet (20 mEq total) by mouth daily. (Patient not taking: Reported on 07/09/2019) 30 tablet 3 Not Taking at Unknown time  . Respiratory Therapy Supplies (FLUTTER) DEVI 1 each by Does not apply route QID. 1 each 0    Social History   Socioeconomic History  . Marital status: Widowed    Spouse name: Not on file  . Number of children: 3  . Years of education: Not on file  . Highest education level: 12th grade  Occupational History  . Occupation: Agricultural consultant    Comment: retired  Tobacco Use  . Smoking status: Former Smoker    Packs/day: 1.00    Years: 55.00      Pack years: 55.00    Types: Cigarettes    Quit date: 11/02/2017    Years since quitting: 1.7  . Smokeless tobacco: Never Used  Substance and Sexual Activity  . Alcohol use: Yes    Alcohol/week: 0.0 standard drinks    Comment: occasionally- wine  . Drug use: No  . Sexual activity: Not Currently  Other Topics Concern  . Not on file  Social History Narrative   Grandson lives with patient. Is available to help her as needed.   Social Determinants of Health   Financial Resource Strain:   . Difficulty of Paying Living Expenses:   Food Insecurity:   . Worried About Charity fundraiser in the Last Year:   . Arboriculturist in the Last Year:   Transportation Needs:   . Film/video editor (Medical):   Marland Kitchen Lack of Transportation (Non-Medical):   Physical Activity: Inactive  . Days of Exercise per Week: 0 days  . Minutes of Exercise per Session: 0 min  Stress:   . Feeling of Stress :   Social Connections: Unknown  . Frequency of Communication with Friends and Family: Patient refused  . Frequency of Social Gatherings with Friends and Family: Patient refused  . Attends Religious Services: Patient refused  . Active Member of Clubs or Organizations: Patient refused  . Attends Archivist Meetings: Patient refused  . Marital Status: Patient refused  Intimate Partner Violence: Unknown  . Fear of Current or Ex-Partner: Patient refused  . Emotionally Abused: Patient refused  . Physically Abused: Patient refused  . Sexually Abused: Patient refused    Family History  Problem Relation Age of Onset  . Colon cancer Mother        colon  . Diabetes Mother   . Arthritis Mother   . Glaucoma Mother   . Stomach cancer Father        stomach  . Throat cancer Brother        throat  . Breast cancer Sister        breast  . Diabetes Sister   . Cirrhosis Sister   . Cancer Paternal Aunt   . Parkinson's disease Sister   . Heart attack Sister   . Ovarian cancer Sister        Review of  systems complete and found to be negative unless listed above     PHYSICAL EXAM  General: Well developed, well nourished, in no acute distress HEENT:  Normocephalic and atramatic Neck:  No JVD.  Heart: Irregularly irregular, rate varying between 95-105bpm.  Neuro: Alert and oriented X 3. Psych:  Good affect, responds appropriately  Labs:   Lab Results  Component Value Date   WBC 12.5 (H) 07/19/2019   HGB 11.7 (L) 07/19/2019   HCT 37.3 07/19/2019   MCV 88.0 07/19/2019   PLT 433 (H) 07/19/2019    Recent Labs  Lab 07/19/19 0125  NA 126*  K 4.4  CL 88*  CO2 25  BUN 20  CREATININE 0.73  CALCIUM 8.8*  GLUCOSE 138*   Lab Results  Component Value Date   TROPONINI <0.03 06/26/2017    Lab Results  Component Value Date   CHOL 220 (H) 05/01/2018   CHOL 141 10/24/2017   CHOL 185 05/02/2016   Lab Results  Component Value Date   HDL 59 05/01/2018   HDL 44 10/24/2017   HDL 59 05/02/2016   Lab Results  Component Value Date   LDLCALC 138 (H) 05/01/2018   LDLCALC 81 10/24/2017   LDLCALC 111 (H) 05/02/2016   Lab Results  Component Value Date   TRIG 116 05/01/2018   TRIG 79 10/24/2017   TRIG 75 05/02/2016   Lab Results  Component Value Date   CHOLHDL 3.7 05/01/2018   CHOLHDL 3.2 10/24/2017   CHOLHDL 3.1 05/02/2016   No results found for: LDLDIRECT    Radiology: DG Chest 2 View  Result Date: 07/09/2019 CLINICAL DATA:  Shortness of breath EXAM: CHEST - 2 VIEW COMPARISON:  06/28/2019 FINDINGS: Perihilar airspace opacities are again noted. These have not substantially changed since the prior study. No pneumothorax. No large pleural effusion. The Port-A-Cath is stable in positioning. Heart size is stable. Aortic calcifications are noted. IMPRESSION: 1. No acute cardiopulmonary process. 2. Stable appearance of the lungs as detailed above. Electronically Signed   By: Constance Holster M.D.   On: 07/09/2019 19:50   DG Chest 2 View  Result Date:  06/28/2019 CLINICAL DATA:  Chest pain, lung cancer EXAM: CHEST - 2 VIEW COMPARISON:  06/17/2019 FINDINGS: Right chest wall port catheter is unchanged. Bilateral perihilar opacities are unchanged. No pleural effusion or pneumothorax. Heart size is stable. Cholecystectomy clips. IMPRESSION: No acute finding. Stable appearance of bilateral perihilar opacities favored to reflect sequelae of radiation. Electronically Signed   By: Macy Mis M.D.   On: 06/28/2019 15:59   CT Angio Chest PE W and/or Wo Contrast  Result Date: 07/09/2019 CLINICAL DATA:  Shortness of breath x2 months. EXAM: CT ANGIOGRAPHY CHEST WITH CONTRAST TECHNIQUE: Multidetector CT imaging of the chest was performed using the standard protocol during bolus administration of intravenous contrast. Multiplanar CT image reconstructions and MIPs were obtained to evaluate the vascular anatomy. CONTRAST:  55mL OMNIPAQUE IOHEXOL 350 MG/ML SOLN COMPARISON:  April 12, 2019 FINDINGS: Cardiovascular: Contrast injection is sufficient to demonstrate satisfactory opacification of the pulmonary arteries to the segmental level. There is no pulmonary embolus. The main pulmonary artery is within normal limits for size. There is no CT evidence of acute right heart strain. There are atherosclerotic changes of the visualized thoracic aorta without evidence for an aneurysm. Heart size is normal. Coronary artery calcifications are noted. Mediastinum/Nodes: --there is new mediastinal adenopathy. For example there is a pretracheal lymph node measuring approximately 1 cm (axial series 4, image 34). There  is a precarinal lymph node measuring approximately 1.4 cm in the short axis (previously measuring less than 4 mm). Hilar adenopathy is noted. --No axillary lymphadenopathy. --there are few mildly prominent left supraclavicular lymph nodes measuring up to approximately 0.8 cm (axial series 4, image 7). These are new since prior CT. --Normal thyroid gland. --The esophagus is  unremarkable Lungs/Pleura: There is a worsening ill-defined left hilar mass causing compression of the left mainstem bronchus as well as the left upper and left lower lobe bronchi. There is worsening postobstructive atelectasis in the superior left lower lobe and left upper lobe. The right perihilar region is essentially stable in appearance from prior study. There are small bilateral pleural effusions, right greater than left. Moderate emphysematous changes are noted bilaterally. There are branching airspace opacities in the left upper lobe concerning for pneumonia. There are few sub solid airspace opacities in the medial right lower lobe which may represent postobstructive atelectasis or pneumonia. Upper Abdomen: No acute abnormality. Musculoskeletal: No chest wall abnormality. No acute or significant osseous findings. IMPRESSION: 1. No acute pulmonary embolism. 2. Postobstructive pneumonia in the left upper lobe. 3. Worsening postobstructive findings in the left upper and left lower lobes with worsening ill-defined soft tissue in the left hilum is concerning for disease progression. Pulmonary medicine follow-up is recommended. 4. Worsening mediastinal, hilar, and left supraclavicular adenopathy. While this may be reactive, nodal metastatic disease is not excluded. 5. Trace to small bilateral pleural effusions. Aortic Atherosclerosis (ICD10-I70.0) and Emphysema (ICD10-J43.9). Electronically Signed   By: Constance Holster M.D.   On: 07/09/2019 22:09   MR BRAIN W WO CONTRAST  Result Date: 07/14/2019 CLINICAL DATA:  74 year old female with right lung cancer, non-small cell. Suspicion of disease progression in the chest recently. Staging. EXAM: MRI HEAD WITHOUT AND WITH CONTRAST TECHNIQUE: Multiplanar, multiecho pulse sequences of the brain and surrounding structures were obtained without and with intravenous contrast. CONTRAST:  44mL GADAVIST GADOBUTROL 1 MMOL/ML IV SOLN COMPARISON:  Noncontrast head CTs  06/27/2017 and earlier. FINDINGS: Brain: No restricted diffusion to suggest acute infarction. No midline shift, mass effect, evidence of mass lesion, ventriculomegaly, extra-axial collection or acute intracranial hemorrhage. Cervicomedullary junction and pituitary are within normal limits. No abnormal enhancement identified. There is motion artifact on the postcontrast images. No dural thickening. Scattered nonspecific cerebral white matter T2 and FLAIR hyperintensity, mild to moderate for age. None resembles vasogenic edema. Similar patchy T2 hyperintensity in the pons. No cortical encephalomalacia or chronic cerebral blood products identified. The deep gray nuclei and cerebellum appear negative. Vascular: Major intracranial vascular flow voids are preserved. Skull and upper cervical spine: Visualized bone marrow signal is within normal limits. Sinuses/Orbits: Postoperative changes to both globes, otherwise negative orbits. Paranasal sinuses are clear. Other: Mild bilateral mastoid effusions. Visible internal auditory structures appear normal. Scalp and face soft tissues appear negative. IMPRESSION: 1. No metastatic disease or acute intracranial abnormality identified. 2. Mild to moderate for age signal changes in the cerebral white matter and pons, most commonly due to chronic small vessel disease. Electronically Signed   By: Genevie Ann M.D.   On: 07/14/2019 23:51   DG Chest Portable 1 View  Result Date: 07/19/2019 CLINICAL DATA:  Shortness of breath. Lung cancer and COPD. EXAM: PORTABLE CHEST 1 VIEW COMPARISON:  07/09/2019 FINDINGS: Unchanged position of right chest wall Port-A-Cath. Bilateral parahilar opacities are unchanged. Opacities in the right lung base are slightly increased. No pleural effusion or pneumothorax. IMPRESSION: Slightly increased opacities in the right lung base may indicate  developing consolidation. Otherwise unchanged radiograph. Electronically Signed   By: Ulyses Jarred M.D.   On:  07/19/2019 02:02    EKG: Atrial flutter with rapid ventricular response, rate of 127bpm   ASSESSMENT AND PLAN:  1.  Atrial flutter with RVR  -In the setting of COVID-19 infection and right lung carcinoma  -Rate is better controlled after diltiazem infusion   -Would continue with diltiazem drip and metoprolol 50mg  BID for now; would recommend d/c diltiazem prior to discharge due to reduced EF   -Continue Eliquis 5mg  BID    2. Reduced LV systolic function with septal and apical hypokinesis   -Inpatient echocardiogram not indicated as patient had one completed at [facility] clinic on 07/14/19  -EF mildly reduced at 40-45% with septal and apical hypokinesis; would consider ischemic workup in an outpatient setting   -Would recommend d/c diltiazem prior to discharge and transition from metoprolol tartrate to metoprolol succinate   3.  Pneumonia   -Treated with abx  4.  Hyponatremia   -Continue with IV fluids    The history, physical exam findings, and plan of care were all discussed with Dr. Bartholome Bill, and all decision making was made in collaboration   Signed: Avie Arenas PA-C 07/19/2019, 11:05 AM

## 2019-07-19 NOTE — ED Notes (Signed)
Pt reports improved work of breathing. Pt is able to speak in full sentences, strong frequent cough noted.

## 2019-07-19 NOTE — ED Notes (Signed)
hospitalist in to see pt.

## 2019-07-19 NOTE — ED Notes (Signed)
External urinary cath placed.

## 2019-07-19 NOTE — Progress Notes (Addendum)
Busy day. Confused all day but sodium is currently 126. Supplemental Sodium pills and extra salt given to patient with dinner. Patient has stripped naked x 3 since admission. She is pleasantly confused. Safety sitter ordfered per Dr. Lanney Gins. Up in chair most of the afternoon. Oxygen saturations have maintained in the high 90s since 11 am meltdown. Ativan IV has relieved any concern about her breathing this afternoon. Daughter called several times and patient happy with telephone calls.

## 2019-07-19 NOTE — ED Notes (Signed)
Attempted to call report

## 2019-07-19 NOTE — Progress Notes (Signed)
[facility] [place] NAME: Cathy Watts    MR#:  [id number]  DATE OF BIRTH:  Aug 22, 1945  SUBJECTIVE:  patient came in with increasing shortness of breath. This is her bottom third admission in the last one month with increasing shortness of breath and COPD exacerbation. she was tested positive for coronavirus.pt was initially on 6 L nasal cannula oxygen however got agitated earlier and currently on 35 L high flow nasal cannula.  REVIEW OF SYSTEMS:   Review of Systems  Constitutional: Negative for chills, fever and weight loss.  HENT: Negative for ear discharge, ear pain and nosebleeds.   Eyes: Negative for blurred vision, pain and discharge.  Respiratory: Negative for sputum production, shortness of breath, wheezing and stridor.   Cardiovascular: Negative for chest pain, palpitations, orthopnea and PND.  Gastrointestinal: Negative for abdominal pain, diarrhea, nausea and vomiting.  Genitourinary: Negative for frequency and urgency.  Musculoskeletal: Negative for back pain and joint pain.  Neurological: Negative for sensory change, speech change, focal weakness and weakness.  Psychiatric/Behavioral: Negative for depression and hallucinations. The patient is not nervous/anxious.    Tolerating Diet: Tolerating PT:   DRUG ALLERGIES:   Allergies  Allergen Reactions  . Amoxicillin Hives and Itching    Did it involve swelling of the face/tongue/throat, SOB, or low BP? No Did it involve sudden or severe rash/hives, skin peeling, or any reaction on the inside of your mouth or nose? No Did you need to seek medical attention at a hospital or doctor's office? No When did it last happen?2018 If all above answers are "NO", may proceed with cephalosporin use.       VITALS:  Blood pressure (!) 148/78, pulse (!) 108, temperature 98.2 F (36.8 C), temperature source Oral, resp. rate (!) 22, height 5\' 10"  (1.778 m), weight 117.7 kg, SpO2  92 %.  PHYSICAL EXAMINATION:   Physical Exam  GENERAL:  74 y.o.-year-old patient lying in the bed with mild -moderate acute respiratory distress. Appears chronically ill EYES: Pupils equal, round, reactive to light and accommodation. No scleral icterus.   HEENT: Head atraumatic, normocephalic. Oropharynx and nasopharynx clear.  NECK:  Supple, no jugular venous distention. No thyroid enlargement, no tenderness.  LUNGS: Coarse breath sounds bilaterally, no wheezing, rales, ++ rhonchi. No use of accessory muscles of respiration.  CARDIOVASCULAR: S1, S2 normal. No murmurs, rubs, or gallops. Tachycardia++ ABDOMEN: Soft, nontender, nondistended. Bowel sounds present. No organomegaly or mass.  EXTREMITIES: No cyanosis, clubbing or edema b/l.    NEUROLOGIC: Cranial nerves II through XII are intact. No focal Motor or sensory deficits b/l.   PSYCHIATRIC:  patient is alert and oriented x 3.  SKIN: No obvious rash, lesion, or ulcer.   LABORATORY PANEL:  CBC Recent Labs  Lab 07/19/19 0125  WBC 12.5*  HGB 11.7*  HCT 37.3  PLT 433*    Chemistries  Recent Labs  Lab 07/19/19 0125  NA 126*  K 4.4  CL 88*  CO2 25  GLUCOSE 138*  BUN 20  CREATININE 0.73  CALCIUM 8.8*   Cardiac Enzymes No results for input(s): TROPONINI in the last 168 hours. RADIOLOGY:  DG Chest Portable 1 View  Result Date: 07/19/2019 CLINICAL DATA:  Shortness of breath. Lung cancer and COPD. EXAM: PORTABLE CHEST 1 VIEW COMPARISON:  07/09/2019 FINDINGS: Unchanged position of right chest wall Port-A-Cath. Bilateral parahilar opacities are unchanged. Opacities in the right lung base are slightly increased. No pleural effusion or pneumothorax. IMPRESSION: Slightly  increased opacities in the right lung base may indicate developing consolidation. Otherwise unchanged radiograph. Electronically Signed   By: Ulyses Jarred M.D.   On: 07/19/2019 02:02   ASSESSMENT AND PLAN:  Cathy Watts  is a 74 y.o. female with a known  history of COPD, dyslipidemia, depression, GERD, and anxiety and depression and squamous cell carcinoma of the right lung, presented to the emergency room with acute onset of worsening dyspnea with associated cough productive of yellowish sputum and wheezing over the last 10 days. chest x-ray showed slightly increased opacities in the right lung base that may indicate developing consolidation   1. Acute on chronic  hypoxemic respiratory failure secondary to COVID-19. -O2 protocol will be followed to keep O2 saturation above 93. -Currently on HFNC 35 liter/min  -appreciate ICU attending input  2. Pneumonia secondary to COVID-19. -IV remdisivir (day 1/5) -Po bactrim -IV decadron - on scheduled Mucinex and as needed Tussionex. -O2 protocol will be followed. - will follow CRP, ferritin, LDH and D-dimer--pending - on vitamin D3, vitamin C, zinc sulfate, p.o. Pepcid and aspirin. -Actemra can be considered for CRP more than 7 with associated hypoxemia.  3.  Atrial flutter with rapid ventricular response with chronic Systolic dysfunction -EF 54-09% with septal and apical hyokinesis -on IV Cardizem drip to replace Cardizem CD for now--change to po metoprolol succinate - continue Eliquis. -Cardiology consult and 2D echo be obtained. -seen by Cardiology PA and Dr fath--rec out pt ichemia w/u  4.  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  5.  Depression and anxiety. -Lexapro and BuSpar   6.  Restless leg syndrome. -on Requip .  7.  GERD. -Carafate and PPI therapy   8.  Dyslipidemia. - on pravchol  9. H/o Squamous cell lung cancer stage 1A -- follows with Dr Rogue Bussing -s/p Chemo and radiation -h/o smoking in the past  10.  DVT prophylaxis. - continue Eliquis.  Palliative care see patient given multiple comorbidities and repeated admission for similar issues. Patient and family will benefit from discussion of goals of  care  Procedures: Family communication : Consults :ICU MD and Cardiology Discharge Disposition :TBD, admitted in the ICU currently on 35 L high flow nasal cannula oxygen with a fib with RVR and on IV diltiazem drip,ongoing  COVID pneumonia rx, Palliative consult placed CODE STATUS: FULL DVT Prophylaxis :eliquis   TOTAL TIME TAKING CARE OF THIS PATIENT: *35* minutes.  >50% time spent on counselling and coordination of care  Note: This dictation was prepared with Dragon dictation along with smaller phrase technology. Any transcriptional errors that result from this process are unintentional.  Fritzi Mandes M.D    [facility]   CC: Primary care physician; Mar Daring, PA-CPatient ID: Cathy Watts, female   DOB: 1945-12-20, 74 y.o.   MRN: [id number]

## 2019-07-19 NOTE — ED Notes (Signed)
Waiting on remdisivir and cardiazem from pharmacy.

## 2019-07-19 NOTE — ED Notes (Signed)
Pt tolerating high flow nasal cannula oxygen 40lpm at 40% well.

## 2019-07-19 NOTE — Telephone Encounter (Signed)
Cathy Watts is calling from [place] for [place]- Frequency starting this week 2 week 1, 1 week 3, 3prn.  Please advise [phone] can leave VM

## 2019-07-19 NOTE — ED Notes (Signed)
Dr. Sidney Ace notified of continued tachycardia and positive covid status. md states will place new orders in computer.

## 2019-07-19 NOTE — ED Notes (Signed)
md mansy notified of continued tachycardia.

## 2019-07-19 NOTE — H&P (Addendum)
[facility] at [place] NAME: Cathy Watts    MR#:  [id number]  DATE OF BIRTH:  10-Dec-1945  DATE OF ADMISSION:  07/19/2019  PRIMARY CARE PHYSICIAN: Mar Daring, PA-C   REQUESTING/REFERRING PHYSICIAN: Blake Divine, MD CHIEF COMPLAINT:   Chief Complaint  Patient presents with  . Respiratory Distress    HISTORY OF PRESENT ILLNESS:  Cathy Watts  is a 74 y.o. female with a known history of COPD, dyslipidemia, depression, GERD, and anxiety and depression and squamous cell carcinoma of the right lung, presented to the emergency room with acute onset of worsening dyspnea with associated cough productive of yellowish sputum and wheezing over the last 10 days.  She denied any fever or chills.  No chest pain or palpitations.  No nausea vomiting or abdominal pain.  She was not sure about known COVID-19 exposure.  She was recently admitted for postobstructive pneumonia and has a scheduled bronchoscopy for bronchial stent by Dr. Patsey Berthold on Friday.  She was noted to be hypoxemic with pulse oximetry of 60% on 2 L of O2 by nasal cannula today by EMS.  She was given 2 nebulized bronchodilator therapy with minimal improvement and was placed on 6 L of O2.  Upon presentation to the emergency room, blood pressure was 124/73 heart rate was 127 with temperature of 99.3 and respirate of 23.  Labs revealed hyponatremia 126 hypochloremia of 88 with BNP of 155 lactic acid of 0.9 then 1.5 high-sensitivity troponin I of 4.  CBC showed leukocytosis of 12.5.  Blood cultures were drawn.  Influenza antigens came back negative. COVID-19 PCR came back positive.  Portable chest x-ray showed slightly increased opacities in the right lung base that may indicate developing consolidation and was otherwise unremarkable..  EKG showed Atrial flutter with rapid ventricular response of 123 and prolonged QT interval with QTC 510 MS.  The patient was given 1 g of p.o. Tylenol, 10 - IV Cardizem,  100 mg of IV doxycycline, a gram of IV cefepime, DuoNeb's and 125 mg of IV Solu-Medrol.  She will be admitted to a medical monitored isolation bed for further evaluation and management. PAST MEDICAL HISTORY:   Past Medical History:  Diagnosis Date  . Abdominal aortic aneurysm (AAA) 3.0 cm to 5.0 cm in diameter in female [facility]) 05/2018   being followed by dr. Delana Meyer. will reultrasound in 1 year  . Anxiety   . Asthma   . Barrett's esophagus   . COPD (chronic obstructive pulmonary disease) ([place])   . Depression   . GERD (gastroesophageal reflux disease)   . History of peptic ulcer disease   . Hypercholesterolemia   . Low oxygen saturation    2l hs  . Osteopenia   . Panic disorder   . Personal history of radiation therapy   . Personal history of tobacco use, presenting hazards to health 01/16/2015  . Requires supplemental oxygen 05/2018   supposed to use 2L NP at night but she currently does not.  encouraged patient to resume this  . Sleep apnea    snores significantly but has never been tested for OSA  . SOB (shortness of breath)   . Squamous cell carcinoma of right lung ([place]) 2015    PAST SURGICAL HISTORY:   Past Surgical History:  Procedure Laterality Date  . CERVICAL CONE BIOPSY  1990  . CHOLECYSTECTOMY  1980's  . colonoscopy  2014  . COLONOSCOPY  07/21/2012  . COLONOSCOPY WITH PROPOFOL N/A 05/12/2018  Procedure: COLONOSCOPY WITH PROPOFOL;  Surgeon: Lollie Sails, MD;  Location[facility] ENDOSCOPY;  Service: Endoscopy;  Laterality: N/A;  . ELBOW FRACTURE SURGERY Right 2009  . ENDOBRONCHIAL ULTRASOUND N/A 02/07/2015   Procedure: ENDOBRONCHIAL ULTRASOUND;  Surgeon: Flora Lipps, MD;  Location: [facility] ORS;  Service: Cardiopulmonary;  Laterality: N/A;  . ENDOBRONCHIAL ULTRASOUND Left 06/05/2018   Procedure: ENDOBRONCHIAL ULTRASOUND;  Surgeon: Flora Lipps, MD;  Location: [facility] ORS;  Service: Cardiopulmonary;  Laterality: Left;  . ESOPHAGOGASTRODUODENOSCOPY (EGD) WITH PROPOFOL N/A  02/26/2016   Procedure: ESOPHAGOGASTRODUODENOSCOPY (EGD) WITH PROPOFOL;  Surgeon: Lollie Sails, MD;  Location: [facility] ENDOSCOPY;  Service: Endoscopy;  Laterality: N/A;  COPD, Sleep Apnea  . ESOPHAGOGASTRODUODENOSCOPY (EGD) WITH PROPOFOL N/A 05/12/2018   Procedure: ESOPHAGOGASTRODUODENOSCOPY (EGD) WITH PROPOFOL;  Surgeon: Lollie Sails, MD;  Location: [facility] ENDOSCOPY;  Service: Endoscopy;  Laterality: N/A;  . EYE SURGERY Bilateral    cataract extractions  . FRACTURE SURGERY  2005   elbow repair  . PORTA CATH INSERTION N/A 06/17/2018   Procedure: PORTA CATH INSERTION;  Surgeon: Katha Cabal, MD;  Location: [facility] CV LAB;  Service: Cardiovascular;  Laterality: N/A;  . TONSILLECTOMY  1979  . TUBAL LIGATION  1970's  . UPPER GI ENDOSCOPY      SOCIAL HISTORY:   Social History   Tobacco Use  . Smoking status: Former Smoker    Packs/day: 1.00    Years: 55.00    Pack years: 55.00    Types: Cigarettes    Quit date: 11/02/2017    Years since quitting: 1.7  . Smokeless tobacco: Never Used  Substance Use Topics  . Alcohol use: Yes    Alcohol/week: 0.0 standard drinks    Comment: occasionally- wine    FAMILY HISTORY:   Family History  Problem Relation Age of Onset  . Colon cancer Mother        colon  . Diabetes Mother   . Arthritis Mother   . Glaucoma Mother   . Stomach cancer Father        stomach  . Throat cancer Brother        throat  . Breast cancer Sister        breast  . Diabetes Sister   . Cirrhosis Sister   . Cancer Paternal Aunt   . Parkinson's disease Sister   . Heart attack Sister   . Ovarian cancer Sister     DRUG ALLERGIES:   Allergies  Allergen Reactions  . Amoxicillin Hives and Itching    Did it involve swelling of the face/tongue/throat, SOB, or low BP? No Did it involve sudden or severe rash/hives, skin peeling, or any reaction on the inside of your mouth or nose? No Did you need to seek medical attention at a hospital or doctor's  office? No When did it last happen?2018 If all above answers are "NO", may proceed with cephalosporin use.       REVIEW OF SYSTEMS:   ROS As per history of present illness. All pertinent systems were reviewed above. Constitutional,  HEENT, cardiovascular, respiratory, GI, GU, musculoskeletal, neuro, psychiatric, endocrine,  integumentary and hematologic systems were reviewed and are otherwise  negative/unremarkable except for positive findings mentioned above in the HPI.   MEDICATIONS AT HOME:   Prior to Admission medications   Medication Sig Start Date End Date Taking? Authorizing Provider  apixaban (ELIQUIS) 5 MG TABS tablet Take 1 tablet (5 mg total) by mouth 2 (two) times daily. 06/19/19  Yes Ezekiel Slocumb, DO  busPIRone (BUSPAR) 30 MG tablet Take 1 tablet by mouth twice daily Patient taking differently: Take 30 mg by mouth 2 (two) times daily.  05/17/19  Yes Mar Daring, PA-C  diltiazem (CARDIZEM CD) 360 MG 24 hr capsule Take 1 capsule (360 mg total) by mouth daily. 07/16/19 08/15/19 Yes Wyvonnia Dusky, MD  escitalopram (LEXAPRO) 20 MG tablet Take 1 tablet by mouth once daily Patient taking differently: Take 20 mg by mouth daily.  05/17/19  Yes Mar Daring, PA-C  Fluticasone-Umeclidin-Vilant (TRELEGY ELLIPTA) 100-62.5-25 MCG/INH AEPB Inhale 1 puff into the lungs daily. 12/28/18  Yes Flora Lipps, MD  furosemide (LASIX) 20 MG tablet Take 1 tablet (20 mg total) by mouth daily. 01/20/19  Yes Mar Daring, PA-C  ipratropium (ATROVENT HFA) 17 MCG/ACT inhaler Inhale 2 puffs into the lungs 4 (four) times daily. 06/19/19 06/18/20 Yes Nicole Kindred A, DO  ipratropium-albuterol (DUONEB) 0.5-2.5 (3) MG/3ML SOLN Take 3 mLs by nebulization every 6 (six) hours as needed. Patient taking differently: Take 3 mLs by nebulization every 6 (six) hours as needed (shortness of breath).  07/02/19  Yes Mar Daring, PA-C  levofloxacin (LEVAQUIN) 750 MG tablet Take  1 tablet (750 mg total) by mouth daily for 5 days. 07/16/19 07/21/19 Yes Wyvonnia Dusky, MD  Magnesium 250 MG TABS Take 250 mg by mouth 2 (two) times daily.    Yes [provider]  metoprolol tartrate (LOPRESSOR) 50 MG tablet Take 50 mg by mouth 2 (two) times daily. 06/23/19  Yes [provider]  omeprazole (PRILOSEC) 40 MG capsule Take 1 capsule by mouth once daily Patient taking differently: Take 40 mg by mouth daily.  04/23/19  Yes Fenton Malling M, PA-C  pravastatin (PRAVACHOL) 20 MG tablet TAKE 1 TABLET BY MOUTH AT BEDTIME Patient taking differently: Take 20 mg by mouth daily.  06/14/19  Yes Burnette, Clearnce Sorrel, PA-C  PROAIR HFA 108 (90 Base) MCG/ACT inhaler INHALE 2 PUFFS BY MOUTH EVERY 4 HOURS AS NEEDED FOR WHEEZING FOR SHORTNESS OF BREATH Patient taking differently: Inhale 2 puffs into the lungs every 4 (four) hours as needed for wheezing or shortness of breath.  06/15/19  Yes Kasa, Maretta Bees, MD  rOPINIRole (REQUIP) 3 MG tablet TAKE 1 TABLET BY MOUTH AT BEDTIME Patient taking differently: Take 3 mg by mouth at bedtime.  02/12/19  Yes Mar Daring, PA-C  sucralfate (CARAFATE) 1 g tablet Take 1 g by mouth 3 (three) times daily. DISSOLVE IN THREE TO FOUR TABLESPOONFULS OF WARM WARM SWISH AND SWALLOW 06/22/19  Yes [provider]  guaiFENesin-dextromethorphan (ROBITUSSIN DM) 100-10 MG/5ML syrup Take 5 mLs by mouth every 4 (four) hours as needed for cough. Patient not taking: Reported on 07/09/2019 05/27/19   Flora Lipps, MD  nystatin (MYCOSTATIN) 100000 UNIT/ML suspension Take 5 mLs (500,000 Units total) by mouth 4 (four) times daily. Patient not taking: Reported on 07/09/2019 06/21/19   Mar Daring, PA-C  polyethylene glycol powder (GLYCOLAX/MIRALAX) powder TAKE 17 GRAMS OF POWDER MIXED IN 8 OUNCES OF WATER BY MOUTH ONCE A DAY. Patient not taking: Reported on 07/09/2019 05/02/17   Mar Daring, PA-C  potassium chloride SA (K-DUR) 20 MEQ tablet Take 1  tablet (20 mEq total) by mouth daily. Patient not taking: Reported on 07/09/2019 10/22/18   Mar Daring, PA-C  Respiratory Therapy Supplies (FLUTTER) DEVI 1 each by Does not apply route QID. 03/31/18   Flora Lipps, MD      VITAL SIGNS:  Blood pressure Marland Kitchen)  142/83, pulse (!) 124, temperature 99.3 F (37.4 C), temperature source Oral, resp. rate 19, height 5\' 10"  (1.778 m), weight 105.2 kg, SpO2 95 %.  PHYSICAL EXAMINATION:  Physical Exam  GENERAL:  74 y.o.-year-old patient lying in the bed with no acute distress.  EYES: Pupils equal, round, reactive to light and accommodation. No scleral icterus. Extraocular muscles intact.  HEENT: Head atraumatic, normocephalic. Oropharynx and nasopharynx clear.  NECK:  Supple, no jugular venous distention. No thyroid enlargement, no tenderness.  LUNGS: Diminished bibasal breath sounds with bibasal crackles and diffuse expiratory wheezes with tight extra airflow and harsh vesicular breathing.   CARDIOVASCULAR: Regular rate and tachycardic rhythm, S1, S2 normal. No murmurs, rubs, or gallops.  ABDOMEN: Soft, nondistended, nontender. Bowel sounds present. No organomegaly or mass.  EXTREMITIES: No pedal edema, cyanosis, or clubbing.  NEUROLOGIC: Cranial nerves II through XII are intact. Muscle strength 5/5 in all extremities. Sensation intact. Gait not checked.  PSYCHIATRIC: The patient is alert and oriented x 3.  Normal affect and good eye contact. SKIN: No obvious rash, lesion, or ulcer.   LABORATORY PANEL:   CBC Recent Labs  Lab 07/19/19 0125  WBC 12.5*  HGB 11.7*  HCT 37.3  PLT 433*   ------------------------------------------------------------------------------------------------------------------  Chemistries  Recent Labs  Lab 07/19/19 0125  NA 126*  K 4.4  CL 88*  CO2 25  GLUCOSE 138*  BUN 20  CREATININE 0.73  CALCIUM 8.8*    ------------------------------------------------------------------------------------------------------------------  Cardiac Enzymes No results for input(s): TROPONINI in the last 168 hours. ------------------------------------------------------------------------------------------------------------------  RADIOLOGY:  DG Chest Portable 1 View  Result Date: 07/19/2019 CLINICAL DATA:  Shortness of breath. Lung cancer and COPD. EXAM: PORTABLE CHEST 1 VIEW COMPARISON:  07/09/2019 FINDINGS: Unchanged position of right chest wall Port-A-Cath. Bilateral parahilar opacities are unchanged. Opacities in the right lung base are slightly increased. No pleural effusion or pneumothorax. IMPRESSION: Slightly increased opacities in the right lung base may indicate developing consolidation. Otherwise unchanged radiograph. Electronically Signed   By: Ulyses Jarred M.D.   On: 07/19/2019 02:02      IMPRESSION AND PLAN:  1.  Acute hypoxemic respiratory failure secondary to COVID-19. -The patient will be admitted to a medically monitored isolation bed. -O2 protocol will be followed to keep O2 saturation above 93.   2.    Pneumonia secondary to COVID-19. -The patient will be admitted to an isolation monitored bed with droplet and contact precautions. -We will obtain procalcitonin level and if less than 0.1 the patient will not need further antibiotic therapy.  -The patient will be placed on scheduled Mucinex and as needed Tussionex. -O2 protocol will be followed. -We will follow CRP, ferritin, LDH and D-dimer. -Will follow manual differential for ANC/ALC ratio as well as follow troponin I and daily CBC with manual differential and CMP. - Will place the patient on IV Remdesivir and IV steroid therapy with IV Solu-Medrol with elevated inflammatory markers. -The patient will be placed on vitamin D3, vitamin C, zinc sulfate, p.o. Pepcid and aspirin. -Actemra can be considered for CRP more than 7 with associated  hypoxemia.  3.  Atrial flutter with rapid ventricular response -The patient will be placed on IV Cardizem drip to replace Cardizem CD for now. -We will continue Eliquis. -Cardiology consult and 2D echo be obtained. -I notified Dr. Ubaldo Glassing about the patient.  4.  Hyponatremia. -The patient will be hydrated with IV normal saline. -Hyponatremia work-up was sent.  5.  Depression and anxiety. -Lexapro and BuSpar will be resumed.  6.  Restless leg syndrome. -Requip will be resumed.  7.  GERD. -Carafate and PPI therapy will be resumed.  8.  Dyslipidemia. -Statin therapy will be resumed.  9.  DVT prophylaxis. -We will continue Eliquis.   All the records are reviewed and case discussed with ED provider. The plan of care was discussed in details with the patient (and family). I answered all questions. The patient agreed to proceed with the above mentioned plan. Further management will depend upon hospital course.   CODE STATUS: Full code  TOTAL TIME TAKING CARE OF THIS PATIENT: 60 minutes.    Christel Mormon M.D on 07/19/2019 at 3:47 AM  [facility]   From 7 PM-7 AM, contact night-coverage [email or web address]  CC: Primary care physician; Mar Daring, PA-C   Note: This dictation was prepared with Dragon dictation along with smaller phrase technology. Any transcriptional errors that result from this process are unintentional.

## 2019-07-19 NOTE — Progress Notes (Signed)
Remdesivir - Pharmacy Brief Note   O:  CXR: IMPRESSION: Slightly increased opacities in the right lung base may indicate developing consolidation. Otherwise unchanged radiograph. SpO2: 95 - 98% on 7L NRB   A/P:  Remdesivir 200 mg IVPB once followed by 100 mg IVPB daily x 4 days.   Tobie Lords, PharmD, BCPS Clinical Pharmacist 07/19/2019 4:57 AM

## 2019-07-19 NOTE — Consult Note (Signed)
CRITICAL CARE PROGRESS NOTE    Name: Cathy Watts MRN: [id number] DOB: 04-11-1946     LOS: 0   SUBJECTIVE FINDINGS & SIGNIFICANT EVENTS   Patient description:  74 yo F with hx of COPD, sq cell lung cancer or right lung, came in hypoxic with spO2 <70%, tachycardic with +COVID test.   Lines / Drains: PIVx2  Right port-aCath-currently not accessed  Cultures / Sepsis markers: COVID19+   Antibiotics: cefepimeX1 in ED no on Bactrim po bid   Protocols / Consultants: Hospitalist, cardio, pccm    Overnight: Patient with severe anxiety - partially improved with 2mg  IV ativan, with prolonged QTC reducing options for mood stabilizers.   PAST MEDICAL HISTORY   Past Medical History:  Diagnosis Date  . Abdominal aortic aneurysm (AAA) 3.0 cm to 5.0 cm in diameter in female [facility]) 05/2018   being followed by dr. Delana Meyer. will reultrasound in 1 year  . Anxiety   . Asthma   . Barrett's esophagus   . COPD (chronic obstructive pulmonary disease) ([place])   . Depression   . GERD (gastroesophageal reflux disease)   . History of peptic ulcer disease   . Hypercholesterolemia   . Low oxygen saturation    2l hs  . Osteopenia   . Panic disorder   . Personal history of radiation therapy   . Personal history of tobacco use, presenting hazards to health 01/16/2015  . Requires supplemental oxygen 05/2018   supposed to use 2L NP at night but she currently does not.  encouraged patient to resume this  . Sleep apnea    snores significantly but has never been tested for OSA  . SOB (shortness of breath)   . 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 CERVICAL CONE BIOPSY  1990  . CHOLECYSTECTOMY  1980's  . colonoscopy  2014  .  COLONOSCOPY  07/21/2012  . COLONOSCOPY WITH PROPOFOL N/A 05/12/2018   Procedure: COLONOSCOPY WITH PROPOFOL;  Surgeon: Lollie Sails, MD;  Location: [facility] ENDOSCOPY;  Service: Endoscopy;  Laterality: N/A;  . ELBOW FRACTURE SURGERY Right 2009  . ENDOBRONCHIAL ULTRASOUND N/A 02/07/2015   Procedure: ENDOBRONCHIAL ULTRASOUND;  Surgeon: Flora Lipps, MD;  Location: [facility] ORS;  Service: Cardiopulmonary;  Laterality: N/A;  . ENDOBRONCHIAL ULTRASOUND Left 06/05/2018   Procedure: ENDOBRONCHIAL ULTRASOUND;  Surgeon: Flora Lipps, MD;  Location: [facility] ORS;  Service: Cardiopulmonary;  Laterality: Left;  . ESOPHAGOGASTRODUODENOSCOPY (EGD) WITH PROPOFOL N/A 02/26/2016   Procedure: ESOPHAGOGASTRODUODENOSCOPY (EGD) WITH PROPOFOL;  Surgeon: Lollie Sails, MD;  Location: [facility] ENDOSCOPY;  Service: Endoscopy;  Laterality: N/A;  COPD, Sleep Apnea  . ESOPHAGOGASTRODUODENOSCOPY (EGD) WITH PROPOFOL N/A 05/12/2018   Procedure: ESOPHAGOGASTRODUODENOSCOPY (EGD) WITH PROPOFOL;  Surgeon: Lollie Sails, MD;  Location: [facility] ENDOSCOPY;  Service: Endoscopy;  Laterality: N/A;  . EYE SURGERY Bilateral    cataract extractions  . FRACTURE SURGERY  2005   elbow repair  . PORTA CATH INSERTION N/A 06/17/2018   Procedure: PORTA CATH INSERTION;  Surgeon: Katha Cabal, MD;  Location: [place] CV LAB;  Service: Cardiovascular;  Laterality: N/A;  . TONSILLECTOMY  1979  . TUBAL LIGATION  1970's  . UPPER GI ENDOSCOPY       FAMILY HISTORY   Family History  Problem Relation Age of Onset  . Colon cancer Mother        colon  . Diabetes Mother   . Arthritis  Mother   . Glaucoma Mother   . Stomach cancer Father        stomach  . Throat cancer Brother        throat  . Breast cancer Sister        breast  . Diabetes Sister   . Cirrhosis Sister   . Cancer Paternal Aunt   . Parkinson's disease Sister   . Heart attack Sister   . Ovarian cancer Sister      SOCIAL HISTORY   Social History   Tobacco Use  .  Smoking status: Former Smoker    Packs/day: 1.00    Years: 55.00    Pack years: 55.00    Types: Cigarettes    Quit date: 11/02/2017    Years since quitting: 1.7  . Smokeless tobacco: Never Used  Substance Use Topics  . Alcohol use: Yes    Alcohol/week: 0.0 standard drinks    Comment: occasionally- wine  . Drug use: No     MEDICATIONS   Current Medication:  Current Facility-Administered Medications:  .  acetaminophen (TYLENOL) tablet 650 mg, 650 mg, Oral, Q6H PRN, Mansy, Jan A, MD .  albuterol (VENTOLIN HFA) 108 (90 Base) MCG/ACT inhaler 2 puff, 2 puff, Inhalation, Q4H PRN, Ottie Glazier, MD .  apixaban (ELIQUIS) tablet 5 mg, 5 mg, Oral, BID, Mansy, Jan A, MD .  aspirin EC tablet 81 mg, 81 mg, Oral, Daily, Mansy, Jan A, MD .  busPIRone (BUSPAR) tablet 30 mg, 30 mg, Oral, BID, Mansy, Jan A, MD .  cholecalciferol (VITAMIN D3) tablet 1,000 Units, 1,000 Units, Oral, Daily, Mansy, Jan A, MD .  dexamethasone (DECADRON) injection 6 mg, 6 mg, Intravenous, Q24H, Mansy, Jan A, MD .  diltiazem (CARDIZEM) 125 mg in dextrose 5% 125 mL (1 mg/mL) infusion, 5-15 mg/hr, Intravenous, Titrated, Mansy, Jan A, MD, Last Rate: 15 mL/hr at 07/19/19 0827, 15 mg/hr at 07/19/19 0827 .  escitalopram (LEXAPRO) tablet 20 mg, 20 mg, Oral, Daily, Mansy, Jan A, MD .  famotidine (PEPCID) tablet 20 mg, 20 mg, Oral, BID, Mansy, Jan A, MD .  furosemide (LASIX) tablet 20 mg, 20 mg, Oral, Daily, Mansy, Jan A, MD .  guaiFENesin-dextromethorphan (ROBITUSSIN DM) 100-10 MG/5ML syrup 5 mL, 5 mL, Oral, Q4H PRN, Mansy, Jan A, MD .  Ipratropium-Albuterol (COMBIVENT) respimat 1 puff, 1 puff, Inhalation, Q6H, Jawanda Passey, MD .  LORazepam (ATIVAN) injection 2 mg, 2 mg, Intravenous, Q4H, Arita Severtson, MD, 2 mg at 07/19/19 1101 .  magnesium hydroxide (MILK OF MAGNESIA) suspension 30 mL, 30 mL, Oral, Daily PRN, Mansy, Jan A, MD .  magnesium oxide (MAG-OX) tablet 200 mg, 200 mg, Oral, BID, Mansy, Jan A, MD .  metoprolol  tartrate (LOPRESSOR) tablet 50 mg, 50 mg, Oral, BID, Mansy, Jan A, MD .  mometasone-formoterol (DULERA) 100-5 MCG/ACT inhaler 2 puff, 2 puff, Inhalation, BID, Lanney Gins, Tayloranne Lekas, MD .  pantoprazole (PROTONIX) EC tablet 40 mg, 40 mg, Oral, Daily, Mansy, Jan A, MD .  pravastatin (PRAVACHOL) tablet 20 mg, 20 mg, Oral, QHS, Mansy, Jan A, MD .  [COMPLETED] remdesivir 200 mg in sodium chloride 0.9% 250 mL IVPB, 200 mg, Intravenous, Once, Stopped at 07/19/19 0827 **FOLLOWED BY** [START ON 07/20/2019] remdesivir 100 mg in sodium chloride 0.9 % 100 mL IVPB, 100 mg, Intravenous, Daily, Mansy, Jan A, MD .  rOPINIRole (REQUIP) tablet 3 mg, 3 mg, Oral, QHS, Mansy, Jan A, MD .  sucralfate (CARAFATE) tablet 1 g, 1 g, Oral, TID, Mansy, Arvella Merles, MD .  traZODone (DESYREL) tablet 25 mg, 25 mg, Oral, QHS PRN, Mansy, Jan A, MD    ALLERGIES   Amoxicillin    REVIEW OF SYSTEMS    10 point ROS with anxiety on report  PHYSICAL EXAMINATION   Vital Signs: Temp:  [98.2 F (36.8 C)-99.3 F (37.4 C)] 98.2 F (36.8 C) (03/15 0825) Pulse Rate:  [98-127] 98 (03/15 1000) Resp:  [15-24] 17 (03/15 1000) BP: (96-142)/(63-102) 116/80 (03/15 1000) SpO2:  [88 %-100 %] 95 % (03/15 1116) FiO2 (%):  [34 %-40 %] 34 % (03/15 1116) Weight:  [105.2 kg] 105.2 kg (03/15 0123)  GENERAL:mild distress due to viral pna HEAD: Normocephalic, atraumatic.  EYES: Pupils equal, round, reactive to light.  No scleral icterus.  MOUTH: Moist mucosal membrane. NECK: Supple. No thyromegaly. No nodules. No JVD.  PULMONARY: rhochorous bs bilaterally CARDIOVASCULAR: S1 and S2. Regular rate and rhythm. No murmurs, rubs, or gallops.  GASTROINTESTINAL: Soft, nontender, non-distended. No masses. Positive bowel sounds. No hepatosplenomegaly.  MUSCULOSKELETAL: No swelling, clubbing, or edema.  NEUROLOGIC: Mild distress due to acute illness SKIN:intact,warm,dry   PERTINENT DATA     Infusions: . diltiazem (CARDIZEM) infusion 15 mg/hr (07/19/19  0827)  . [START ON 07/20/2019] remdesivir 100 mg in NS 100 mL     Scheduled Medications: . apixaban  5 mg Oral BID  . aspirin EC  81 mg Oral Daily  . busPIRone  30 mg Oral BID  . cholecalciferol  1,000 Units Oral Daily  . dexamethasone (DECADRON) injection  6 mg Intravenous Q24H  . escitalopram  20 mg Oral Daily  . famotidine  20 mg Oral BID  . furosemide  20 mg Oral Daily  . Ipratropium-Albuterol  1 puff Inhalation Q6H  . LORazepam  2 mg Intravenous Q4H  . magnesium oxide  200 mg Oral BID  . metoprolol tartrate  50 mg Oral BID  . mometasone-formoterol  2 puff Inhalation BID  . pantoprazole  40 mg Oral Daily  . pravastatin  20 mg Oral QHS  . rOPINIRole  3 mg Oral QHS  . sucralfate  1 g Oral TID   PRN Medications: acetaminophen, albuterol, guaiFENesin-dextromethorphan, magnesium hydroxide, traZODone Hemodynamic parameters:   Intake/Output: 03/14 0701 - 03/15 0700 In: 451.7 [I.V.:101.7; IV Piggyback:350] Out: -   Ventilator  Settings: FiO2 (%):  [34 %-40 %] 34 %    LAB RESULTS:  Basic Metabolic Panel: Recent Labs  Lab 07/15/19 0349 07/19/19 0125  NA 126* 126*  K 4.6 4.4  CL 89* 88*  CO2 30 25  GLUCOSE 151* 138*  BUN 17 20  CREATININE 0.56 0.73  CALCIUM 8.7* 8.8*   Liver Function Tests: No results for input(s): AST, ALT, ALKPHOS, BILITOT, PROT, ALBUMIN in the last 168 hours. No results for input(s): LIPASE, AMYLASE in the last 168 hours. No results for input(s): AMMONIA in the last 168 hours. CBC: Recent Labs  Lab 07/15/19 0349 07/19/19 0125  WBC 9.2 12.5*  NEUTROABS  --  8.4*  HGB 10.8* 11.7*  HCT 32.9* 37.3  MCV 87.0 88.0  PLT 353 433*   Cardiac Enzymes: No results for input(s): CKTOTAL, CKMB, CKMBINDEX, TROPONINI in the last 168 hours. BNP: Invalid input(s): POCBNP CBG: Recent Labs  Lab 07/19/19 0913  GLUCAP 166*     IMAGING RESULTS:  Imaging: DG Chest Portable 1 View  Result Date: 07/19/2019 CLINICAL DATA:  Shortness of breath. Lung  cancer and COPD. EXAM: PORTABLE CHEST 1 VIEW COMPARISON:  07/09/2019 FINDINGS: Unchanged position of right chest wall  Port-A-Cath. Bilateral parahilar opacities are unchanged. Opacities in the right lung base are slightly increased. No pleural effusion or pneumothorax. IMPRESSION: Slightly increased opacities in the right lung base may indicate developing consolidation. Otherwise unchanged radiograph. Electronically Signed   By: Ulyses Jarred M.D.   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 ICU monitoring -workup in process -gentle rehydration -will start salt tabs   GI/Nutrition GI PROPHYLAXIS as indicated DIET-->TF's as tolerated Constipation protocol as indicated  ENDO - ICU hypoglycemic\Hyperglycemia protocol -check FSBS per protocol   ELECTROLYTES -follow labs as needed -replace as needed -pharmacy consultation   DVT/GI PRX ordered -SCDs  TRANSFUSIONS AS NEEDED MONITOR FSBS ASSESS the need for LABS as needed   Critical care provider statement:    Critical care time (minutes):  33   Critical care time was exclusive of:  Separately billable procedures and treating other patients   Critical care was necessary to treat or prevent imminent or life-threatening deterioration of the following conditions:  acute on chronic hypoxemic respiratory failure, encephalopathy, pneumonia, lung cancer, multiple comorbid conditions.    Critical care was time spent personally  by me on the following activities:  Development of treatment plan with patient or surrogate, discussions with consultants, evaluation of patient's response to treatment, examination of patient, obtaining history from patient or surrogate, ordering and performing treatments and interventions, ordering and review of laboratory studies and re-evaluation of patient's condition.  I assumed direction of critical care for this patient from another provider in my specialty: no    This document was prepared using Dragon voice recognition software and may include unintentional dictation errors.    Ottie Glazier, M.D.  Division of [place]

## 2019-07-19 NOTE — Progress Notes (Signed)
Patient is COVID-19 Positive. CPE is not on the door, it is hanging in the side. Patient has Advance Directive

## 2019-07-19 NOTE — ED Notes (Signed)
Report to stephanie, rn

## 2019-07-20 DIAGNOSIS — Z515 Encounter for palliative care: Secondary | ICD-10-CM

## 2019-07-20 DIAGNOSIS — Z7189 Other specified counseling: Secondary | ICD-10-CM

## 2019-07-20 LAB — CBC WITH DIFFERENTIAL/PLATELET
Abs Immature Granulocytes: 0.35 10*3/uL — ABNORMAL HIGH (ref 0.00–0.07)
Basophils Absolute: 0 10*3/uL (ref 0.0–0.1)
Basophils Relative: 0 %
Eosinophils Absolute: 0 10*3/uL (ref 0.0–0.5)
Eosinophils Relative: 0 %
HCT: 32 % — ABNORMAL LOW (ref 36.0–46.0)
Hemoglobin: 10.5 g/dL — ABNORMAL LOW (ref 12.0–15.0)
Immature Granulocytes: 3 %
Lymphocytes Relative: 8 %
Lymphs Abs: 0.8 10*3/uL (ref 0.7–4.0)
MCH: 28.5 pg (ref 26.0–34.0)
MCHC: 32.8 g/dL (ref 30.0–36.0)
MCV: 86.7 fL (ref 80.0–100.0)
Monocytes Absolute: 1 10*3/uL (ref 0.1–1.0)
Monocytes Relative: 9 %
Neutro Abs: 8.1 10*3/uL — ABNORMAL HIGH (ref 1.7–7.7)
Neutrophils Relative %: 80 %
Platelets: 339 10*3/uL (ref 150–400)
RBC: 3.69 MIL/uL — ABNORMAL LOW (ref 3.87–5.11)
RDW: 15.5 % (ref 11.5–15.5)
WBC: 10.2 10*3/uL (ref 4.0–10.5)
nRBC: 0 % (ref 0.0–0.2)

## 2019-07-20 LAB — COMPREHENSIVE METABOLIC PANEL
ALT: 35 U/L (ref 0–44)
AST: 29 U/L (ref 15–41)
Albumin: 3.1 g/dL — ABNORMAL LOW (ref 3.5–5.0)
Alkaline Phosphatase: 72 U/L (ref 38–126)
Anion gap: 11 (ref 5–15)
BUN: 17 mg/dL (ref 8–23)
CO2: 29 mmol/L (ref 22–32)
Calcium: 9 mg/dL (ref 8.9–10.3)
Chloride: 90 mmol/L — ABNORMAL LOW (ref 98–111)
Creatinine, Ser: 0.59 mg/dL (ref 0.44–1.00)
GFR calc Af Amer: >60 mL/min (ref >60)
GFR calc non Af Amer: >60 mL/min (ref >60)
Glucose, Bld: 153 mg/dL — ABNORMAL HIGH (ref 70–99)
Potassium: 4.6 mmol/L (ref 3.5–5.1)
Sodium: 130 mmol/L — ABNORMAL LOW (ref 135–145)
Total Bilirubin: 0.6 mg/dL (ref 0.3–1.2)
Total Protein: 6.2 g/dL — ABNORMAL LOW (ref 6.5–8.1)

## 2019-07-20 LAB — FIBRIN DERIVATIVES D-DIMER (ARMC ONLY): Fibrin derivatives D-dimer (ARMC): 1244.75 ng/mL (FEU) — ABNORMAL HIGH (ref 0.00–499.00)

## 2019-07-20 LAB — NA AND K (SODIUM & POTASSIUM), RAND UR
Potassium Urine: 18 mmol/L
Sodium, Ur: 28 mmol/L

## 2019-07-20 LAB — FERRITIN: Ferritin: 103 ng/mL (ref 11–307)

## 2019-07-20 LAB — C-REACTIVE PROTEIN: CRP: 2.8 mg/dL — ABNORMAL HIGH (ref <1.0)

## 2019-07-20 LAB — OSMOLALITY, URINE: Osmolality, Ur: 235 mOsm/kg — ABNORMAL LOW (ref 300–900)

## 2019-07-20 LAB — MAGNESIUM: Magnesium: 2.2 mg/dL (ref 1.7–2.4)

## 2019-07-20 NOTE — Progress Notes (Signed)
Pts MAP 58 at 2200. Reached out to Velva, NP to notify of B/P s/p evening dose of lopressor. Orders given to initiate peripheral levophed infusion. Pt resting comfortably in bed at this time, asymptomatic with no s/s of distress.

## 2019-07-20 NOTE — Progress Notes (Signed)
[facility]    SUBJECTIVE: Cathy Watts is a 74 year old female with a past medical history significant for paroxsymal atrial flutter, anticoagulated with Eliquis, a AAA, squamous cell carcinoma of the right lung, oxygen dependent COPD, hyperlipidemia, and GERD who presented to the ED on 07/19/19 for a few day history of progressive shortness of breath and a productive cough.  Upon EMS arrival, O2 sats were noted to be 66% on 2L [place].  Workup in the ED was significant for COVID-19 positive, ECG revealing atrial flutter at a rate of 127bpm, BNP of 155, high sensitivity troponin negative at 4, sodium of 126, and WBC of 12.5.  She was given 10mg  IV Cardizem infusion, doxycycline, cefepime, DuoNeb's, 125mg  of IV Solumedrol and admitted to the ICU for further evaluation and treatment.    She has since been weaned off of diltiazem drip and is currently on long acting diltiazem 360mg  BID and metoprolol 50mg  BID. Earlier today, she became agitated and is currently on 35 L high flow [place].    Vitals:   07/20/19 0950 07/20/19 1000 07/20/19 1100 07/20/19 1208  BP: 129/81 97/75 128/87   Pulse:  (!) 122 75   Resp:  17 16   Temp:   98.6 F (37 C)   TempSrc:      SpO2:  100% (!) 87% 92%  Weight:      Height:         Intake/Output Summary (Last 24 hours) at 07/20/2019 1357 Last data filed at 07/20/2019 0800 Gross per 24 hour  Intake 885.3 ml  Output 1100 ml  Net -214.7 ml      PHYSICAL EXAM  Heart: Regularly irregular, rate controlled . Normal S1 and S2 without gallops or murmurs.     LABS: Basic Metabolic Panel: Recent Labs    07/19/19 0125 07/20/19 0422  NA 126* 130*  K 4.4 4.6  CL 88* 90*  CO2 25 29  GLUCOSE 138* 153*  BUN 20 17  CREATININE 0.73 0.59  CALCIUM 8.8* 9.0  MG  --  2.2   Liver Function Tests: Recent Labs    07/20/19 0422  AST 29  ALT 35  ALKPHOS 72  BILITOT 0.6  PROT 6.2*  ALBUMIN 3.1*   No results for input(s): LIPASE, AMYLASE in the last 72 hours. CBC:  Recent Labs    07/19/19 0125 07/20/19 0422  WBC 12.5* 10.2  NEUTROABS 8.4* 8.1*  HGB 11.7* 10.5*  HCT 37.3 32.0*  MCV 88.0 86.7  PLT 433* 339   Cardiac Enzymes: No results for input(s): CKTOTAL, CKMB, CKMBINDEX, TROPONINI in the last 72 hours. BNP: Invalid input(s): POCBNP D-Dimer: No results for input(s): DDIMER in the last 72 hours. Hemoglobin A1C: No results for input(s): HGBA1C in the last 72 hours. Fasting Lipid Panel: No results for input(s): CHOL, HDL, LDLCALC, TRIG, CHOLHDL, LDLDIRECT in the last 72 hours. Thyroid Function Tests: Recent Labs    07/19/19 0936  TSH 1.286   Anemia Panel: Recent Labs    07/20/19 0422  FERRITIN 103    DG Chest Portable 1 View  Result Date: 07/19/2019 CLINICAL DATA:  Shortness of breath. Lung cancer and COPD. EXAM: PORTABLE CHEST 1 VIEW COMPARISON:  07/09/2019 FINDINGS: Unchanged position of right chest wall Port-A-Cath. Bilateral parahilar opacities are unchanged. Opacities in the right lung base are slightly increased. No pleural effusion or pneumothorax. IMPRESSION: Slightly increased opacities in the right lung base may indicate developing consolidation. Otherwise unchanged radiograph. Electronically Signed   By: Lennette Bihari  [place] M.D.   On: 07/19/2019 02:02     Echo: Mild LV systolic dysfunction with an EF estimated between 40-45% with mild LVH; apical and septal hypokinesis noted  TELEMETRY: Atrial flutter, controlled ventricular response   ASSESSMENT AND PLAN:  Active Problems:   COPD exacerbation ([place])   COVID-19   Acute on chronic respiratory failure with hypoxia ([facility])   Atrial fibrillation with RVR ([place])    1.  Atrial flutter   -Rate controlled off of diltiazem drip   -Continue diltiazem 360mg  daily and metoprolol 50mg  BID with close monitoring of BP   -Okay to remain on diltiazem for now, but would recommend d/c at discharge due to reduced LV systolic function  -Continue Eliquis 5mg  BID   2. Reduced LV systolic  function with septal and apical hypokinesis              -Inpatient echocardiogram not indicated as patient had one completed at [facility] clinic on 07/14/19             -EF mildly reduced at 40-45% with septal and apical hypokinesis; would consider ischemic workup in an outpatient setting              -Would recommend d/c diltiazem prior to discharge; will also transition from tartrate to succinate prior to discharge and titrate dose as indicated    Avie Arenas  PA-C 07/20/2019 1:57 PM

## 2019-07-20 NOTE — Progress Notes (Addendum)
[place] [facility] [place] NAME: Cathy Watts    MR#:  [id number]  DATE OF BIRTH:  Aug 17, 1945  SUBJECTIVE:  patient came in with increasing shortness of breath. This is her bottom third admission in the last one month with increasing shortness of breath and COPD exacerbation. she was tested positive for coronavirus.pt was initially on 6 L nasal cannula oxygen however got agitated earlier and currently on 35 L high flow nasal cannula.  REVIEW OF SYSTEMS:   Review of Systems  Constitutional: Negative for chills, fever and weight loss.  HENT: Negative for ear discharge, ear pain and nosebleeds.   Eyes: Negative for blurred vision, pain and discharge.  Respiratory: Positive for cough, shortness of breath and wheezing. Negative for sputum production and stridor.   Cardiovascular: Negative for chest pain, palpitations, orthopnea and PND.  Gastrointestinal: Negative for abdominal pain, diarrhea, nausea and vomiting.  Genitourinary: Negative for frequency and urgency.  Musculoskeletal: Negative for back pain and joint pain.  Neurological: Positive for weakness. Negative for sensory change, speech change and focal weakness.  Psychiatric/Behavioral: Negative for depression and hallucinations. The patient is nervous/anxious.    Tolerating Diet:yes Tolerating PT: pending  DRUG ALLERGIES:   Allergies  Allergen Reactions  . Amoxicillin Hives and Itching    Did it involve swelling of the face/tongue/throat, SOB, or low BP? No Did it involve sudden or severe rash/hives, skin peeling, or any reaction on the inside of your mouth or nose? No Did you need to seek medical attention at a hospital or doctor's office? No When did it last happen?2018 If all above answers are "NO", may proceed with cephalosporin use.     . Chlorhexidine Dermatitis    Patient refused    VITALS:  Blood pressure 128/87, pulse 75, temperature 98.6 F (37 C), resp. rate  16, height 5\' 10"  (1.778 m), weight 116.7 kg, SpO2 92 %.  PHYSICAL EXAMINATION:   Physical Exam  GENERAL:  74 y.o.-year-old patient lying in the bed with mild -moderate acute respiratory distress. Appears chronically ill EYES: Pupils equal, round, reactive to light and accommodation. No scleral icterus.   HEENT: Head atraumatic, normocephalic. Oropharynx and nasopharynx clear.  NECK:  Supple, no jugular venous distention. No thyroid enlargement, no tenderness.  LUNGS: Coarse breath sounds bilaterally, no wheezing, rales, ++ rhonchi. No use of accessory muscles of respiration.  CARDIOVASCULAR: S1, S2 normal. No murmurs, rubs, or gallops. Tachycardia++ ABDOMEN: Soft, nontender, nondistended. Bowel sounds present. No organomegaly or mass.  EXTREMITIES: No cyanosis, clubbing or edema b/l.    NEUROLOGIC: Cranial nerves II through XII are intact. No focal Motor or sensory deficits b/l.   PSYCHIATRIC:  patient is alert and oriented x 3.  SKIN: No obvious rash, lesion, or ulcer.   LABORATORY PANEL:  CBC Recent Labs  Lab 07/20/19 0422  WBC 10.2  HGB 10.5*  HCT 32.0*  PLT 339    Chemistries  Recent Labs  Lab 07/20/19 0422  NA 130*  K 4.6  CL 90*  CO2 29  GLUCOSE 153*  BUN 17  CREATININE 0.59  CALCIUM 9.0  MG 2.2  AST 29  ALT 35  ALKPHOS 72  BILITOT 0.6   Cardiac Enzymes No results for input(s): TROPONINI in the last 168 hours. RADIOLOGY:  DG Chest Portable 1 View  Result Date: 07/19/2019 CLINICAL DATA:  Shortness of breath. Lung cancer and COPD. EXAM: PORTABLE CHEST 1 VIEW COMPARISON:  07/09/2019 FINDINGS: Unchanged position of right chest  wall Port-A-Cath. Bilateral parahilar opacities are unchanged. Opacities in the right lung base are slightly increased. No pleural effusion or pneumothorax. IMPRESSION: Slightly increased opacities in the right lung base may indicate developing consolidation. Otherwise unchanged radiograph. Electronically Signed   By: Ulyses Jarred M.D.    On: 07/19/2019 02:02   ASSESSMENT AND PLAN:  Cathy Watts  is a 74 y.o. female with a known history of COPD, dyslipidemia, depression, GERD, and anxiety and depression and squamous cell carcinoma of the right lung, presented to the emergency room with acute onset of worsening dyspnea with associated cough productive of yellowish sputum and wheezing over the last 10 days. chest x-ray showed slightly increased opacities in the right lung base that may indicate developing consolidation   1. Acute on chronic  hypoxemic respiratory failure secondary to COVID-19. -O2 protocol will be followed to keep O2 saturation above 93. -wason HFNC 35 liter/min after anxiety spell-->now down to 2L/min  (her baseline) -appreciate ICU attending input  2. Pneumonia secondary to COVID-19. -IV remdisivir (day 2/5) -Po bactrim--empiric -IV decadron - on scheduled Mucinex and as needed Tussionex. -O2 protocol will be followed. - will follow CRP 2.8, ferritin- 103 - on vitamin D3, vitamin C, zinc sulfate, p.o. Pepcid and aspirin.  3. Atrial flutter with rapid ventricular response with chronic Systolic dysfunction -EF 19-41% with septal and apical hyokinesis -on IV Cardizem drip --weaned off --change to po metoprolol succinate and cont Cardizem CD 360 mg qd - continue Eliquis. -seen by Cardiology PA and Dr fath--rec out pt ischemia w/u  4.  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  5.  Depression and anxiety. -Lexapro and BuSpar   6.  Restless leg syndrome. -on Requip .  7.  GERD. -Carafate and PPI therapy   8.  Dyslipidemia. - on pravchol  9. H/o Squamous cell lung cancer stage 1A -- follows with Dr Rogue Bussing -s/p Chemo and radiation -h/o smoking in the past -pt will need to be reschedule at as out pt with Dr Mortimer Fries to get Bronch stent placement. She will need to be off Eliquis also  10.  DVT prophylaxis. - continue Eliquis.  Palliative care see  patient given multiple comorbidities and repeated admission for similar issues. Patient and family will benefit from discussion of goals of care  Procedures: Family communication :spoke with [name]--dter on the phone Consults :ICU MD and Cardiology Discharge Disposition : most likely to home after completing Remdesivir, PT pending CODE STATUS: FULL DVT Prophylaxis :eliquis   TOTAL TIME TAKING CARE OF THIS PATIENT: *25* minutes.  >50% time spent on counselling and coordination of care  Note: This dictation was prepared with Dragon dictation along with smaller phrase technology. Any transcriptional errors that result from this process are unintentional.  Fritzi Mandes M.D    [facility]   CC: Primary care physician; Mar Daring, PA-CPatient ID: [name], female   DOB: Sep 11, 1945, 73 y.o.   MRN: [id number]

## 2019-07-20 NOTE — Consult Note (Signed)
Consultation Note Date: 07/20/2019   Patient Name: Cathy Watts  DOB: 1946-04-19  MRN: [id number]  Age / Sex: 74 y.o., female  PCP: Mar Daring, PA-C Referring Physician: Fritzi Mandes, MD  Reason for Consultation: Establishing goals of care  HPI/Patient Profile: 74 y.o. female  with past medical history of COPD, HLD, depression, GERD, anxiety, depression, and squamous cell carcinoma of R lung admitted on 07/19/2019 with shortness of breath. Patient found to be COVID-19 positive. Diagnosed with acute on chronic hypoxemic respiratory failure secondary to pna and COVID-19.  This is patient's third admission in past month. PMT consulted for [place].  Clinical Assessment and Goals of Care: I have reviewed medical records including EPIC notes, labs and imaging, received report from RN, assessed the patient and then spoke with daughter, Cathy Watts,  to discuss diagnosis prognosis, GOC, EOL wishes, disposition and options.  I introduced Palliative Medicine as specialized medical care for people living with serious illness. It focuses on providing relief from the symptoms and stress of a serious illness. The goal is to improve quality of life for both the patient and the family.  We discussed a brief life review of the patient. She tells me patient worked as a Librarian, academic at [employer]. She has always been a very active, energized person.   As far as functional and nutritional status, Cathy Watts tells me of a rapid decline in the past 2 months. She tells me of worsening weakness and some cognitive decline - becoming forgetful and easily confused. Great appetite.    We discussed her current illness and what it means in the larger context of her on-going co-morbidities.  Natural disease trajectory and expectations at EOL were discussed. Cathy Watts shares about patient's cancer and different treatments. She shares about plan for stent placement soon d/t tumor pressure  on airway. We discussed patient's COVID infections and concern about long term prognosis given her COPD, oxygen dependence, and lung cancer. We discussed concerns about patient's mental status and delirium. We discussed patient's multiple hospitalizations.  I attempted to elicit values and goals of care important to the patient. The difference between aggressive medical intervention and comfort care was considered in light of the patient's goals of care. For now, Cathy Watts would like to "take it a day at a time" and see how patient responds to current treatment. PMT will continue to follow along.  Advance directives, concepts specific to code status, artifical feeding and hydration, and rehospitalization were considered and discussed. Discussed code status as it appears patient was DNR during previous admission. Cathy Watts tells me this is inaccurate and the patient would want resuscitation attempts. She tells me she would not want to be on ventilator long-term - no trach.   Cathy Watts is one of 3 daughters but she is HCPOA.   Cathy Watts also shares the patient would never want to go to a facility and Cathy Watts feels that they will be able to provide whatever type of care she needs at home.   Questions and concerns were addressed.  The family was encouraged to call with questions or concerns.   Primary Decision Maker HCPOA - daughter, Cathy Watts    SUMMARY OF RECOMMENDATIONS   Full code/full scope PMT will follow along Patient/family not interested in facility/rehab placement - supportive family at home  Code Status/Advance Care Planning:  Full code  Additional Recommendations (Limitations, Scope, Preferences):  No Tracheostomy  Prognosis:   Unable to determine  Discharge Planning: To Be Determined      Primary Diagnoses:  Present on Admission: . COPD exacerbation ([place]) . COVID-19   I have reviewed the medical record, interviewed the patient and family, and examined the patient. The following  aspects are pertinent.  Past Medical History:  Diagnosis Date  . Abdominal aortic aneurysm (AAA) 3.0 cm to 5.0 cm in diameter in female [facility]) 05/2018   being followed by dr. Delana Meyer. will reultrasound in 1 year  . Anxiety   . Asthma   . Barrett's esophagus   . COPD (chronic obstructive pulmonary disease) ([place])   . Depression   . GERD (gastroesophageal reflux disease)   . History of peptic ulcer disease   . Hypercholesterolemia   . Low oxygen saturation    2l hs  . Osteopenia   . Panic disorder   . Personal history of radiation therapy   . Personal history of tobacco use, presenting hazards to health 01/16/2015  . Requires supplemental oxygen 05/2018   supposed to use 2L NP at night but she currently does not.  encouraged patient to resume this  . Sleep apnea    snores significantly but has never been tested for OSA  . SOB (shortness of breath)   . Squamous cell carcinoma of right lung ([place]) 2015   Social History   Socioeconomic History  . Marital status: Widowed    Spouse name: Not on file  . Number of children: 3  . Years of education: Not on file  . Highest education level: 12th grade  Occupational History  . Occupation: Agricultural consultant    Comment: retired  Tobacco Use  . Smoking status: Former Smoker    Packs/day: 1.00    Years: 55.00    Pack years: 55.00    Types: Cigarettes    Quit date: 11/02/2017    Years since quitting: 1.7  . Smokeless tobacco: Never Used  Substance and Sexual Activity  . Alcohol use: Yes    Alcohol/week: 0.0 standard drinks    Comment: occasionally- wine  . Drug use: No  . Sexual activity: Not Currently  Other Topics Concern  . Not on file  Social History Narrative   Grandson lives with patient. Is available to help her as needed.   Social Determinants of Health   Financial Resource Strain:   . Difficulty of Paying Living Expenses:   Food Insecurity:   . Worried About Charity fundraiser in the Last Year:   .  Arboriculturist in the Last Year:   Transportation Needs:   . Film/video editor (Medical):   Marland Kitchen Lack of Transportation (Non-Medical):   Physical Activity: Inactive  . Days of Exercise per Week: 0 days  . Minutes of Exercise per Session: 0 min  Stress:   . Feeling of Stress :   Social Connections: Unknown  . Frequency of Communication with Friends and Family: Patient refused  . Frequency of Social Gatherings with Friends and Family: Patient refused  . Attends Religious Services: Patient refused  . Active Member of Clubs or Organizations: Patient refused  . Attends Archivist Meetings: Patient refused  . Marital Status: Patient refused   Family History  Problem Relation Age of Onset  . Colon cancer Mother        colon  . Diabetes Mother   . Arthritis Mother   . Glaucoma Mother   . Stomach cancer Father        stomach  . Throat cancer Brother        throat  .  Breast cancer Sister        breast  . Diabetes Sister   . Cirrhosis Sister   . Cancer Paternal Aunt   . Parkinson's disease Sister   . Heart attack Sister   . Ovarian cancer Sister    Scheduled Meds: . apixaban  5 mg Oral BID  . aspirin EC  81 mg Oral Daily  . busPIRone  30 mg Oral BID  . cholecalciferol  1,000 Units Oral Daily  . dexamethasone (DECADRON) injection  6 mg Intravenous Q24H  . diltiazem  360 mg Oral Daily  . escitalopram  20 mg Oral Daily  . famotidine  20 mg Oral BID  . furosemide  20 mg Oral Daily  . Ipratropium-Albuterol  1 puff Inhalation Q6H  . magnesium oxide  200 mg Oral BID  . metoprolol tartrate  50 mg Oral BID  . mometasone-formoterol  2 puff Inhalation BID  . pantoprazole  40 mg Oral Daily  . pravastatin  20 mg Oral QHS  . rOPINIRole  3 mg Oral QHS  . sodium chloride  1 g Oral BID WC  . sucralfate  1 g Oral TID  . sulfamethoxazole-trimethoprim  1 tablet Oral Q12H   Continuous Infusions: . sodium chloride    . diltiazem (CARDIZEM) infusion 15 mg/hr (07/19/19 0827)  .  norepinephrine (LEVOPHED) Adult infusion 2 mcg/min (07/20/19 0344)  . remdesivir 100 mg in NS 100 mL 100 mg (07/20/19 0941)   PRN Meds:.acetaminophen, albuterol, guaiFENesin-dextromethorphan, LORazepam, magnesium hydroxide, morphine injection, traZODone Allergies  Allergen Reactions  . Amoxicillin Hives and Itching    Did it involve swelling of the face/tongue/throat, SOB, or low BP? No Did it involve sudden or severe rash/hives, skin peeling, or any reaction on the inside of your mouth or nose? No Did you need to seek medical attention at a hospital or doctor's office? No When did it last happen?2018 If all above answers are "NO", may proceed with cephalosporin use.     . Chlorhexidine Dermatitis    Patient refused    Vital Signs: BP 129/81   Pulse 60   Temp (P) 97.9 F (36.6 C) (Axillary)   Resp 13   Ht 5\' 10"  (1.778 m)   Wt 116.7 kg   SpO2 97%   BMI 36.92 kg/m  Pain Scale: 0-10   Pain Score: Asleep   SpO2: SpO2: 97 % O2 Device:SpO2: 97 % O2 Flow Rate: .O2 Flow Rate (L/min): 35 L/min  IO: Intake/output summary:   Intake/Output Summary (Last 24 hours) at 07/20/2019 1022 Last data filed at 07/20/2019 0344 Gross per 24 hour  Intake 435.3 ml  Output 600 ml  Net -164.7 ml    LBM: Last BM Date: 07/18/19 Baseline Weight: Weight: 105.2 kg Most recent weight: Weight: 116.7 kg     Palliative Assessment/Data: PPS 50%    The above conversation was completed via telephone due to the visitor restrictions during the COVID-19 pandemic. Thorough chart review and discussion with necessary members of the care team was completed as part of assessment. All issues were discussed and addressed but no physical exam was performed.  Time Total: 70 minutes Greater than 50%  of this time was spent counseling and coordinating care related to the above assessment and plan.  Juel Burrow, DNP, AGNP-C Palliative Medicine Team [phone] Pager: [phone]

## 2019-07-20 NOTE — Progress Notes (Signed)
PT Cancellation Note  Patient Details Name: Cathy Watts MRN: [id number] DOB: 1945-07-27   Cancelled Treatment:    Reason Eval/Treat Not Completed: Fatigue/lethargy limiting ability to participate(Consult received and chart reviewed. Patient sleeping soundly upon arrival to room. Per discussion with primary RN, requests therapist allow patient to rest calmly at this time and re-attempt next date. Will continue to follow and initiate as appropriate)   Ayjah Show H. Owens Shark, PT, DPT, NCS 07/20/19, 2:21 PM [phone]

## 2019-07-20 NOTE — Progress Notes (Signed)
CRITICAL CARE PROGRESS NOTE    Name: Cathy Watts MRN: [id number] DOB: [birth year]     LOS: 1   SUBJECTIVE FINDINGS & SIGNIFICANT EVENTS   Patient description:  74 yo F with hx of COPD, sq cell lung cancer or right lung, came in hypoxic with spO2 <70%, tachycardic with +COVID test.   Lines / Drains: PIVx2  Right port-aCath-currently not accessed  Cultures / Sepsis markers: COVID19+   Antibiotics: cefepimeX1 in ED no on Bactrim po bid   Protocols / Consultants: Hospitalist, cardio, pccm    Overnight: Patient with severe anxiety - partially improved with 2mg  IV ativan, with prolonged QTC reducing options for mood stabilizers.   PAST MEDICAL HISTORY   Past Medical History:  Diagnosis Date  . Abdominal aortic aneurysm (AAA) 3.0 cm to 5.0 cm in diameter in female [facility]) 05/2018   being followed by dr. Delana Meyer. will reultrasound in 1 year  . Anxiety   . Asthma   . Barrett's esophagus   . COPD (chronic obstructive pulmonary disease) ([place])   . Depression   . GERD (gastroesophageal reflux disease)   . History of peptic ulcer disease   . Hypercholesterolemia   . Low oxygen saturation    2l hs  . Osteopenia   . Panic disorder   . Personal history of radiation therapy   . Personal history of tobacco use, presenting hazards to health 01/16/2015  . Requires supplemental oxygen 05/2018   supposed to use 2L NP at night but she currently does not.  encouraged patient to resume this  . Sleep apnea    snores significantly but has never been tested for OSA  . SOB (shortness of breath)   . Squamous cell carcinoma of right lung ([place]) 2015     SURGICAL HISTORY   Past Surgical History:  Procedure Laterality Date  . CERVICAL CONE BIOPSY  1990  . CHOLECYSTECTOMY  1980's  . colonoscopy  2014  .  COLONOSCOPY  07/21/2012  . COLONOSCOPY WITH PROPOFOL N/A 05/12/2018   Procedure: COLONOSCOPY WITH PROPOFOL;  Surgeon: Lollie Sails, MD;  Location: [facility] END[facility]COPY;  Service: Endoscopy;  Laterality: N/A;  . ELBOW FRACTURE SURGERY Right 2009  . ENDOBRONCHIAL ULTRASOUND N/A 02/07/2015   Procedure: ENDOBRONCHIAL ULTRASOUND;  Surgeon: Flora Lipps, MD;  Location: [facility] ORS;  Service: Cardiopulmonary;  Laterality: N/A;  . ENDOBRONCHIAL ULTRASOUND Left 06/05/2018   Procedure: ENDOBRONCHIAL ULTRASOUND;  Surgeon: Flora Lipps, MD;  Location: [facility] ORS;  Service: Cardiopulmonary;  Laterality: Left;  . ESOPHAGOGASTRODUODENOSCOPY (EGD) WITH PROPOFOL N/A 02/26/2016   Procedure: ESOPHAGOGASTRODUODENOSCOPY (EGD) WITH PROPOFOL;  Surgeon: Lollie Sails, MD;  Location: [facility] ENDOSCOPY;  Service: Endoscopy;  Laterality: N/A;  COPD, Sleep Apnea  . ESOPHAGOGASTRODUODENOSCOPY (EGD) WITH PROPOFOL N/A 05/12/2018   Procedure: ESOPHAGOGASTRODUODENOSCOPY (EGD) WITH PROPOFOL;  Surgeon: Lollie Sails, MD;  Location: [facility] ENDOSCOPY;  Service: Endoscopy;  Laterality: N/A;  . EYE SURGERY Bilateral    cataract extractions  . FRACTURE SURGERY  2005   elbow repair  . PORTA CATH INSERTION N/A 06/17/2018   Procedure: PORTA CATH INSERTION;  Surgeon: Katha Cabal, MD;  Location: [facility];  Service: Cardiovascular;  Laterality: N/A;  . TONSILLECTOMY  1979  . TUBAL LIGATION  1970's  . UPPER GI ENDOSCOPY       FAMILY HISTORY   Family History  Problem Relation Age of Onset  . Colon cancer Mother        colon  . Diabetes Mother   . Arthritis  Mother   . Glaucoma Mother   . Stomach cancer Father        stomach  . Throat cancer Brother        throat  . Breast cancer Sister        breast  . Diabetes Sister   . Cirrhosis Sister   . Cancer Paternal Aunt   . Parkinson's disease Sister   . Heart attack Sister   . Ovarian cancer Sister      SOCIAL HISTORY   Social History   Tobacco Use  .  Smoking status: Former Smoker    Packs/day: 1.00    Years: 55.00    Pack years: 55.00    Types: Cigarettes    Quit date: 11/02/2017    Years since quitting: 1.7  . Smokeless tobacco: Never Used  Substance Use Topics  . Alcohol use: Yes    Alcohol/week: 0.0 standard drinks    Comment: occasionally- wine  . Drug use: No     MEDICATIONS   Current Medication:  Current Facility-Administered Medications:  .  0.9 %  sodium chloride infusion, 250 mL, Intravenous, Continuous, Blakeney, Dreama Saa, NP .  acetaminophen (TYLENOL) tablet 650 mg, 650 mg, Oral, Q6H PRN, Mansy, Jan A, MD .  albuterol (VENTOLIN HFA) 108 (90 Base) MCG/ACT inhaler 2 puff, 2 puff, Inhalation, Q4H PRN, Ottie Glazier, MD, 2 puff at 07/20/19 0955 .  apixaban (ELIQUIS) tablet 5 mg, 5 mg, Oral, BID, Mansy, Jan A, MD, 5 mg at 07/20/19 0955 .  aspirin EC tablet 81 mg, 81 mg, Oral, Daily, Mansy, Jan A, MD, 81 mg at 07/20/19 0953 .  busPIRone (BUSPAR) tablet 30 mg, 30 mg, Oral, BID, Mansy, Jan A, MD, 30 mg at 07/20/19 0943 .  cholecalciferol (VITAMIN D3) tablet 1,000 Units, 1,000 Units, Oral, Daily, Mansy, Arvella Merles, MD, 1,000 Units at 07/20/19 0957 .  dexamethasone (DECADRON) injection 6 mg, 6 mg, Intravenous, Q24H, Mansy, Jan A, MD, 6 mg at 07/20/19 1001 .  diltiazem (CARDIZEM CD) 24 hr capsule 360 mg, 360 mg, Oral, Daily, Jerie Basford, MD, 360 mg at 07/20/19 0950 .  diltiazem (CARDIZEM) 125 mg in dextrose 5% 125 mL (1 mg/mL) infusion, 5-15 mg/hr, Intravenous, Titrated, Mansy, Jan A, MD, Last Rate: 15 mL/hr at 07/19/19 0827, 15 mg/hr at 07/19/19 0827 .  escitalopram (LEXAPRO) tablet 20 mg, 20 mg, Oral, Daily, Mansy, Jan A, MD, 20 mg at 07/20/19 0944 .  famotidine (PEPCID) tablet 20 mg, 20 mg, Oral, BID, Mansy, Jan A, MD, 20 mg at 07/20/19 0957 .  furosemide (LASIX) tablet 20 mg, 20 mg, Oral, Daily, Mansy, Jan A, MD, 20 mg at 07/20/19 0957 .  guaiFENesin-dextromethorphan (ROBITUSSIN DM) 100-10 MG/5ML syrup 5 mL, 5 mL, Oral, Q4H  PRN, Mansy, Jan A, MD .  Ipratropium-Albuterol (COMBIVENT) respimat 1 puff, 1 puff, Inhalation, Q6H, Ottie Glazier, MD, 1 puff at 07/20/19 0505 .  LORazepam (ATIVAN) tablet 1 mg, 1 mg, Oral, Q6H PRN, Ottie Glazier, MD, 1 mg at 07/19/19 1715 .  magnesium hydroxide (MILK OF MAGNESIA) suspension 30 mL, 30 mL, Oral, Daily PRN, Mansy, Jan A, MD .  magnesium oxide (MAG-OX) tablet 200 mg, 200 mg, Oral, BID, Mansy, Jan A, MD, 200 mg at 07/20/19 0956 .  metoprolol tartrate (LOPRESSOR) tablet 50 mg, 50 mg, Oral, BID, Mansy, Jan A, MD, 50 mg at 07/20/19 0952 .  mometasone-formoterol (DULERA) 100-5 MCG/ACT inhaler 2 puff, 2 puff, Inhalation, BID, Ottie Glazier, MD, 2 puff at 07/20/19 1005 .  morphine  2 MG/ML injection 1-2 mg, 1-2 mg, Intravenous, Q4H PRN, Awilda Bill, NP, 2 mg at 07/19/19 2125 .  norepinephrine (LEVOPHED) 4mg  in 222mL premix infusion, 2-10 mcg/min, Intravenous, Titrated, Blakeney, Dreama Saa, NP, Last Rate: 7.5 mL/hr at 07/20/19 0344, 2 mcg/min at 07/20/19 0344 .  pantoprazole (PROTONIX) EC tablet 40 mg, 40 mg, Oral, Daily, Mansy, Jan A, MD, 40 mg at 07/20/19 0953 .  pravastatin (PRAVACHOL) tablet 20 mg, 20 mg, Oral, QHS, Mansy, Jan A, MD, 20 mg at 07/19/19 2122 .  [COMPLETED] remdesivir 200 mg in sodium chloride 0.9% 250 mL IVPB, 200 mg, Intravenous, Once, Stopped at 07/19/19 0827 **FOLLOWED BY** remdesivir 100 mg in sodium chloride 0.9 % 100 mL IVPB, 100 mg, Intravenous, Daily, Mansy, Jan A, MD, Last Rate: 200 mL/hr at 07/20/19 0941, 100 mg at 07/20/19 0941 .  rOPINIRole (REQUIP) tablet 3 mg, 3 mg, Oral, QHS, Mansy, Jan A, MD, 3 mg at 07/19/19 2123 .  sodium chloride tablet 1 g, 1 g, Oral, BID WC, Parminder Cupples, MD, 1 g at 07/20/19 1002 .  sucralfate (CARAFATE) tablet 1 g, 1 g, Oral, TID, Mansy, Jan A, MD, 1 g at 07/20/19 0955 .  sulfamethoxazole-trimethoprim (BACTRIM) 400-80 MG per tablet 1 tablet, 1 tablet, Oral, Q12H, Ottie Glazier, MD, 1 tablet at 07/20/19 0945 .  traZODone  (DESYREL) tablet 25 mg, 25 mg, Oral, QHS PRN, Mansy, Jan A, MD, 25 mg at 07/19/19 2124    ALLERGIES   Amoxicillin and Chlorhexidine    REVIEW OF SYSTEMS    10 point ROS with anxiety on report  PHYSICAL EXAMINATION   Vital Signs: Temp:  [97.6 F (36.4 C)-98.6 F (37 C)] (P) 97.9 F (36.6 C) (03/16 0200) Pulse Rate:  [41-121] 60 (03/16 0515) Resp:  [10-23] 13 (03/16 0515) BP: (71-154)/(44-130) 129/81 (03/16 0950) SpO2:  [92 %-100 %] 97 % (03/16 0515) FiO2 (%):  [34 %-41 %] 41 % (03/16 0455) Weight:  [116.7 kg] 116.7 kg (03/16 0500)  GENERAL:mild distress due to viral pna HEAD: Normocephalic, atraumatic.  EYES: Pupils equal, round, reactive to light.  No scleral icterus.  MOUTH: Moist mucosal membrane. NECK: Supple. No thyromegaly. No nodules. No JVD.  PULMONARY: rhochorous bs bilaterally CARDIOVASCULAR: S1 and S2. Regular rate and rhythm. No murmurs, rubs, or gallops.  GASTROINTESTINAL: Soft, nontender, non-distended. No masses. Positive bowel sounds. No hepatosplenomegaly.  MUSCULOSKELETAL: No swelling, clubbing, or edema.  NEUROLOGIC: Mild distress due to acute illness SKIN:intact,warm,dry   PERTINENT DATA     Infusions: . sodium chloride    . diltiazem (CARDIZEM) infusion 15 mg/hr (07/19/19 0827)  . norepinephrine (LEVOPHED) Adult infusion 2 mcg/min (07/20/19 0344)  . remdesivir 100 mg in NS 100 mL 100 mg (07/20/19 0941)   Scheduled Medications: . apixaban  5 mg Oral BID  . aspirin EC  81 mg Oral Daily  . busPIRone  30 mg Oral BID  . cholecalciferol  1,000 Units Oral Daily  . dexamethasone (DECADRON) injection  6 mg Intravenous Q24H  . diltiazem  360 mg Oral Daily  . escitalopram  20 mg Oral Daily  . famotidine  20 mg Oral BID  . furosemide  20 mg Oral Daily  . Ipratropium-Albuterol  1 puff Inhalation Q6H  . magnesium oxide  200 mg Oral BID  . metoprolol tartrate  50 mg Oral BID  . mometasone-formoterol  2 puff Inhalation BID  . pantoprazole  40 mg  Oral Daily  . pravastatin  20 mg Oral QHS  . rOPINIRole  3 mg Oral QHS  . sodium chloride  1 g Oral BID WC  . sucralfate  1 g Oral TID  . sulfamethoxazole-trimethoprim  1 tablet Oral Q12H   PRN Medications: acetaminophen, albuterol, guaiFENesin-dextromethorphan, LORazepam, magnesium hydroxide, morphine injection, traZODone Hemodynamic parameters:   Intake/Output: 03/15 0701 - 03/16 0700 In: 435.3 [P.O.:375; I.V.:60.3] Out: 600 [Urine:600]  Ventilator  Settings: FiO2 (%):  [34 %-41 %] 41 %    LAB RESULTS:  Basic Metabolic Panel: Recent Labs  Lab 07/15/19 0349 07/15/19 0349 07/19/19 0125 07/20/19 0422  NA 126*  --  126* 130*  K 4.6   < > 4.4 4.6  CL 89*  --  88* 90*  CO2 30  --  25 29  GLUCOSE 151*  --  138* 153*  BUN 17  --  20 17  CREATININE 0.56  --  0.73 0.59  CALCIUM 8.7*  --  8.8* 9.0  MG  --   --   --  2.2   < > = values in this interval not displayed.   Liver Function Tests: Recent Labs  Lab 07/20/19 0422  AST 29  ALT 35  ALKPHOS 72  BILITOT 0.6  PROT 6.2*  ALBUMIN 3.1*   No results for input(s): LIPASE, AMYLASE in the last 168 hours. No results for input(s): AMMONIA in the last 168 hours. CBC: Recent Labs  Lab 07/15/19 0349 07/19/19 0125 07/20/19 0422  WBC 9.2 12.5* 10.2  NEUTROABS  --  8.4* 8.1*  HGB 10.8* 11.7* 10.5*  HCT 32.9* 37.3 32.0*  MCV 87.0 88.0 86.7  PLT 353 433* 339   Cardiac Enzymes: No results for input(s): CKTOTAL, CKMB, CKMBINDEX, TROPONINI in the last 168 hours. BNP: Invalid input(s): POCBNP CBG: Recent Labs  Lab 07/19/19 0913  GLUCAP 166*     IMAGING RESULTS:  Imaging: DG Chest Portable 1 View  Result Date: 07/19/2019 CLINICAL DATA:  Shortness of breath. Lung cancer and COPD. EXAM: PORTABLE CHEST 1 VIEW COMPARISON:  07/09/2019 FINDINGS: Unchanged position of right chest wall Port-A-Cath. Bilateral parahilar opacities are unchanged. Opacities in the right lung base are slightly increased. No pleural effusion or  pneumothorax. IMPRESSION: Slightly increased opacities in the right lung base may indicate developing consolidation. Otherwise unchanged radiograph. Electronically Signed   By: Ulyses Jarred M.D.   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 ICU monitoring -workup in process -gentle rehydration -will start salt tabs   GI/Nutrition GI PROPHYLAXIS as indicated DIET-->TF's as tolerated Constipation protocol as indicated  ENDO - ICU hypoglycemic\Hyperglycemia protocol -check FSBS per protocol   ELECTROLYTES -follow labs as needed -replace as needed -pharmacy consultation   DVT/GI PRX ordered -SCDs  TRANSFUSIONS AS NEEDED MONITOR FSBS ASSESS the need for LABS as needed   Critical care provider statement:    Critical care time (minutes):  33   Critical care time was exclusive of:  Separately billable procedures and  treating other patients   Critical care was necessary to treat or prevent imminent or life-threatening deterioration of the following conditions:  acute on chronic hypoxemic respiratory failure, encephalopathy, pneumonia, lung cancer, multiple comorbid conditions.    Critical care was time spent personally by me on the following activities:  Development of treatment plan with patient or surrogate, discussions with consultants, evaluation of patient's response to treatment, examination of patient, obtaining history from patient or surrogate, ordering and performing  treatments and interventions, ordering and review of laboratory studies and re-evaluation of patient's condition.  I assumed direction of critical care for this patient from another provider in my specialty: no    This document was prepared using Dragon voice recognition software and may include unintentional dictation errors.    Ottie Glazier, M.D.  Division of [place]

## 2019-07-20 NOTE — Progress Notes (Signed)
Patient ID: Cathy Watts, female   DOB: 07-17-1945, 74 y.o.   MRN: [id number]  Metoprolol discontinued since pt's bp dropped last nite requiring IV levophed. HR 60's slow Afib. Cont Cardizem CD

## 2019-07-20 NOTE — Progress Notes (Signed)
Better day. Remains oriented x 4 but still hallucinating elephants,ants on the wall and flying men this morning. Appetite and fluid intake was better. Napped for 2 hours and awake very happy and refreshed. Oriented x 4 but complimented our designer on the Keith Rake theme in her room. I informed her interior design was Dr Teodoro Kil hobby and I would pass along the compliment. She was pleased.

## 2019-07-20 NOTE — Progress Notes (Signed)
CSW attempted call to patient's daughter, Gae Bon, for Readmission Screening and Heart Failure Consult. Patient is on airborne precautions and per rounds, has been having anxiety and panic attacks. CSW left a message for Gae Bon requesting a return call.   Oleh Genin, [place]

## 2019-07-21 ENCOUNTER — Other Ambulatory Visit: Payer: Medicare Other

## 2019-07-21 DIAGNOSIS — R410 Disorientation, unspecified: Secondary | ICD-10-CM

## 2019-07-21 LAB — COMPREHENSIVE METABOLIC PANEL
ALT: 38 U/L (ref 0–44)
AST: 22 U/L (ref 15–41)
Albumin: 3.1 g/dL — ABNORMAL LOW (ref 3.5–5.0)
Alkaline Phosphatase: 64 U/L (ref 38–126)
Anion gap: 11 (ref 5–15)
BUN: 18 mg/dL (ref 8–23)
CO2: 29 mmol/L (ref 22–32)
Calcium: 8.8 mg/dL — ABNORMAL LOW (ref 8.9–10.3)
Chloride: 96 mmol/L — ABNORMAL LOW (ref 98–111)
Creatinine, Ser: 0.72 mg/dL (ref 0.44–1.00)
GFR calc Af Amer: >60 mL/min (ref >60)
GFR calc non Af Amer: >60 mL/min (ref >60)
Glucose, Bld: 149 mg/dL — ABNORMAL HIGH (ref 70–99)
Potassium: 4.7 mmol/L (ref 3.5–5.1)
Sodium: 136 mmol/L (ref 135–145)
Total Bilirubin: 0.4 mg/dL (ref 0.3–1.2)
Total Protein: 6 g/dL — ABNORMAL LOW (ref 6.5–8.1)

## 2019-07-21 LAB — CBC WITH DIFFERENTIAL/PLATELET
Abs Immature Granulocytes: 0.34 10*3/uL — ABNORMAL HIGH (ref 0.00–0.07)
Basophils Absolute: 0 10*3/uL (ref 0.0–0.1)
Basophils Relative: 0 %
Eosinophils Absolute: 0 10*3/uL (ref 0.0–0.5)
Eosinophils Relative: 0 %
HCT: 33.6 % — ABNORMAL LOW (ref 36.0–46.0)
Hemoglobin: 10.6 g/dL — ABNORMAL LOW (ref 12.0–15.0)
Immature Granulocytes: 3 %
Lymphocytes Relative: 6 %
Lymphs Abs: 0.8 10*3/uL (ref 0.7–4.0)
MCH: 28 pg (ref 26.0–34.0)
MCHC: 31.5 g/dL (ref 30.0–36.0)
MCV: 88.9 fL (ref 80.0–100.0)
Monocytes Absolute: 1.4 10*3/uL — ABNORMAL HIGH (ref 0.1–1.0)
Monocytes Relative: 12 %
Neutro Abs: 9.2 10*3/uL — ABNORMAL HIGH (ref 1.7–7.7)
Neutrophils Relative %: 79 %
Platelets: 347 10*3/uL (ref 150–400)
RBC: 3.78 MIL/uL — ABNORMAL LOW (ref 3.87–5.11)
RDW: 15.9 % — ABNORMAL HIGH (ref 11.5–15.5)
WBC: 11.7 10*3/uL — ABNORMAL HIGH (ref 4.0–10.5)
nRBC: 0 % (ref 0.0–0.2)

## 2019-07-21 LAB — FERRITIN: Ferritin: 103 ng/mL (ref 11–307)

## 2019-07-21 LAB — MAGNESIUM: Magnesium: 2.2 mg/dL (ref 1.7–2.4)

## 2019-07-21 LAB — C-REACTIVE PROTEIN: CRP: 1.1 mg/dL — ABNORMAL HIGH (ref <1.0)

## 2019-07-21 LAB — FIBRIN DERIVATIVES D-DIMER (ARMC ONLY): Fibrin derivatives D-dimer (ARMC): 845.89 ng/mL (FEU) — ABNORMAL HIGH (ref 0.00–499.00)

## 2019-07-21 MED ORDER — POLYETHYLENE GLYCOL 3350 17 G PO PACK
17.0000 g | PACK | Freq: Every day | ORAL | Status: DC
  Administered 2019-07-21 – 2019-07-22 (×2): 17 g via ORAL
  Filled 2019-07-21 (×3): qty 1

## 2019-07-21 NOTE — Progress Notes (Signed)
[facility] [place] NAME: Cathy Watts    MR#:  [id number]  DATE OF BIRTH:  June 16, 1945  SUBJECTIVE:  patient came in with increasing shortness of breath. This is her bottom third admission in the last one month with increasing shortness of breath and COPD exacerbation. she was tested positive for coronavirus.  Patient was on 6 L and then 35 L with high flow now patient is on 3 L oxygen via nasal cannula.  Feeling improvement.     REVIEW OF SYSTEMS:   Review of Systems  Constitutional: Negative for chills, fever and weight loss.  HENT: Negative for ear discharge, ear pain and nosebleeds.   Eyes: Negative for blurred vision, pain and discharge.  Respiratory: Positive for cough, shortness of breath and wheezing. Negative for sputum production and stridor.   Cardiovascular: Negative for chest pain, palpitations, orthopnea and PND.  Gastrointestinal: Negative for abdominal pain, diarrhea, nausea and vomiting.  Genitourinary: Negative for frequency and urgency.  Musculoskeletal: Negative for back pain and joint pain.  Neurological: Positive for weakness. Negative for sensory change, speech change and focal weakness.  Psychiatric/Behavioral: Negative for depression and hallucinations. The patient is nervous/anxious.    Tolerating Diet:yes Tolerating PT: pending  DRUG ALLERGIES:   Allergies  Allergen Reactions  . Amoxicillin Hives and Itching    Did it involve swelling of the face/tongue/throat, SOB, or low BP? No Did it involve sudden or severe rash/hives, skin peeling, or any reaction on the inside of your mouth or nose? No Did you need to seek medical attention at a hospital or doctor's office? No When did it last happen?2018 If all above answers are "NO", may proceed with cephalosporin use.     . Chlorhexidine Dermatitis    Patient refused    VITALS:  Blood pressure (!) 112/53, pulse 85, temperature (!) 95.4 F (35.2 C),  temperature source Oral, resp. rate 14, height 5\' 10"  (1.778 m), weight 116.7 kg, SpO2 96 %.  PHYSICAL EXAMINATION:   Physical Exam  GENERAL:  74 y.o.-year-old patient lying in the bed with mild -moderate acute respiratory distress. Appears chronically ill EYES: Pupils equal, round, reactive to light and accommodation. No scleral icterus.   HEENT: Head atraumatic, normocephalic. Oropharynx and nasopharynx clear.  NECK:  Supple, no jugular venous distention. No thyroid enlargement, no tenderness.  LUNGS: Coarse breath sounds bilaterally, no wheezing, rales, ++ rhonchi. No use of accessory muscles of respiration.  CARDIOVASCULAR: S1, S2 normal. No murmurs, rubs, or gallops. Tachycardia++ ABDOMEN: Soft, nontender, nondistended. Bowel sounds present. No organomegaly or mass.  EXTREMITIES: No cyanosis, clubbing or edema b/l.    NEUROLOGIC: Cranial nerves II through XII are intact. No focal Motor or sensory deficits b/l.   PSYCHIATRIC:  patient is alert and oriented x 3.  SKIN: No obvious rash, lesion, or ulcer.   LABORATORY PANEL:  CBC Recent Labs  Lab 07/21/19 0742  WBC 11.7*  HGB 10.6*  HCT 33.6*  PLT 347    Chemistries  Recent Labs  Lab 07/21/19 0742  NA 136  K 4.7  CL 96*  CO2 29  GLUCOSE 149*  BUN 18  CREATININE 0.72  CALCIUM 8.8*  MG 2.2  AST 22  ALT 38  ALKPHOS 64  BILITOT 0.4   Cardiac Enzymes No results for input(s): TROPONINI in the last 168 hours. RADIOLOGY:  No results found. ASSESSMENT AND PLAN:  Cathy Watts  is a 74 y.o. female with a known history of  COPD, dyslipidemia, depression, GERD, and anxiety and depression and squamous cell carcinoma of the right lung, presented to the emergency room with acute onset of worsening dyspnea with associated cough productive of yellowish sputum and wheezing over the last 10 days. chest x-ray showed slightly increased opacities in the right lung base that may indicate developing consolidation   1. Acute on  chronic  hypoxemic respiratory failure secondary to COVID-19. -O2 protocol will be followed to keep O2 saturation above 93. -wason HFNC 35 liter/min after anxiety spell-->now down to 2L/min [name] (her baseline) -appreciate ICU attending input  2. Pneumonia secondary to COVID-19. -IV remdisivir 3/15=>3/19  -Po bactrim--empiric -IV decadron - on scheduled Mucinex and as needed Tussionex. -O2 protocol will be followed. - will follow CRP 2.8, ferritin- 103 - on vitamin D3, vitamin C, zinc sulfate, p.o. Pepcid and aspirin.  3. Atrial flutter with rapid ventricular response with chronic Systolic dysfunction -EF 59-93% with septal and apical hyokinesis -on IV Cardizem drip --weaned off --change to po metoprolol succinate and cont Cardizem CD 360 mg qd - continue Eliquis. -seen by Cardiology PA and Dr fath--rec out pt ischemia w/u  4.  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 --Started fluid restriction  5.  Depression and anxiety. -Lexapro and BuSpar   6.  Restless leg syndrome. -on Requip .  7.  GERD. -Carafate and PPI therapy   8.  Dyslipidemia. - on pravchol  9. H/o Squamous cell lung cancer stage 1A -- follows with Dr Rogue Bussing -s/p Chemo and radiation -h/o smoking in the past -pt will need to be reschedule at as out pt with Dr Mortimer Fries to get Bronch stent placement. She will need to be off Eliquis also  10.  DVT prophylaxis. - continue Eliquis.  Palliative care see patient given multiple comorbidities and repeated admission for similar issues. Patient and family will benefit from discussion of goals of care  Procedures: Family communication : Previous attending spoke with [name]--dter on the phone Consults :ICU MD and Cardiology Discharge Disposition : most likely to home after completing Remdesivir, last dose 07/23/2019,  PT pending  CODE STATUS: FULL DVT Prophylaxis :eliquis   TOTAL TIME TAKING CARE OF THIS PATIENT: *25* minutes.   >50% time spent on counselling and coordination of care  Note: This dictation was prepared with Dragon dictation along with smaller phrase technology. Any transcriptional errors that result from this process are unintentional.  Val Riles M.D    [facility]   CC: Primary care physician; Mar Daring, PA-CPatient ID: Cathy Watts, female   DOB: December 27, 1945, 73 y.o.   MRN: [id number]

## 2019-07-21 NOTE — TOC Initial Note (Signed)
Transition of Care [facility]) - Initial/Assessment Note    Patient Details  Name: Cathy Watts MRN: [id number] Date of Birth: 11/14/1945  [facility] CM/SW Contact:    Magnus Ivan, LCSW Phone Number: 07/21/2019, 2:22 PM  Clinical Narrative:                CSW unable to reach patient's daughter for assessments. Per notes, patient is alert and oriented x 4 today. CSW completed Heart Failure and Readmission Screens via phone due to airborne precautions. Patient reported she lives with her daughter Gae Bon and [name]'s fiance. Patient stated her other daughter, [name], also lives close by and is involved and supportive. Patient uses Product[facility]process development scientist on [employer] in [place] and denied issues with obtaining medications. Patient sees Fenton Malling for primary care and stated she previously drove herself to appointments, but daughters are also able to assist with this if needed. Patient was receiving [place] through [facility] and would like these to continue at discharge. Patient reported she has oxygen, a rolling walker, bedside commode, and gait belt at home. Patient reported she weighs herself daily and is in a program through [employer] where she reports her weight to them. Patient said she has a cardiologist and knows to report weight gain of 2 pounds in 1 day or 5 pounds in 1 week to her doctor. She denied needs at this time. CSW will continue to follow.   Expected Discharge Plan: [place] Barriers to Discharge: Continued Medical Work up   Patient Goals and CMS Choice        Expected Discharge Plan and Services Expected Discharge Plan: [place]       Living arrangements for the past 2 months: Single Family Home                                      Prior Living Arrangements/Services Living arrangements for the past 2 months: Single Family Home Lives with:: Adult Children Patient language and need  for interpreter reviewed:: Yes Do you feel safe going back to the place where you live?: Yes      Need for Family Participation in Patient Care: Yes (Comment) Care giver support system in place?: Yes (comment) Current home services: DME, Home RN Criminal Activity/Legal Involvement Pertinent to Current Situation/Hospitalization: No - Comment as needed  Activities of Daily Living Home Assistive Devices/Equipment: Nebulizer, Walker (specify type), CPAP, Shower chair with back, Wheelchair, Bedside commode/3-in-1, Oxygen, Grab bars around toilet, Grab bars in shower, Raised toilet seat with rails ADL Screening (condition at time of admission) Patient's cognitive ability adequate to safely complete daily activities?: Yes Is the patient deaf or have difficulty hearing?: No Does the patient have difficulty seeing, even when wearing glasses/contacts?: No Does the patient have difficulty concentrating, remembering, or making decisions?: Yes Patient able to express need for assistance with ADLs?: Yes Does the patient have difficulty dressing or bathing?: Yes Independently performs ADLs?: No Communication: Needs assistance Is this a change from baseline?: Change from baseline, expected to last >3 days Dressing (OT): Dependent Is this a change from baseline?: Change from baseline, expected to last >3 days Grooming: Dependent, Needs assistance Is this a change from baseline?: Change from baseline, expected to last >3 days Feeding: Dependent, Needs assistance Bathing: Dependent, Needs assistance Toileting: Dependent, Needs assistance Is this a change from baseline?: Change  from baseline, expected to last >3days In/Out Bed: Needs assistance Is this a change from baseline?: Change from baseline, expected to last >3 days Walks in Home: Needs assistance Is this a change from baseline?: Change from baseline, expected to last >3 days Does the patient have difficulty walking or climbing stairs?:  Yes Weakness of Legs: Both Weakness of Arms/Hands: Both  Permission Sought/Granted   Permission granted to share information with : Yes, Verbal Permission Granted  Share Information with NAME: daughters  Permission granted to share info w AGENCY: [facility]        Emotional Assessment   Attitude/Demeanor/Rapport: Engaged Affect (typically observed): Calm, Accepting Orientation: : Oriented to Self, Oriented to Place, Oriented to  Time, Oriented to Situation Alcohol / Substance Use: Not Applicable Psych Involvement: Yes (comment)  Admission diagnosis:  COPD exacerbation ([place]) [J44.1] Atrial fibrillation with RVR (HCC) [I48.91] Acute on chronic respiratory failure with hypoxia ([place]) [J96.21] COVID-19 [U07.1] Patient Active Problem List   Diagnosis Date Noted  . Goals of care, counseling/discussion   . Palliative care by specialist   . COPD exacerbation ([place]) 07/19/2019  . COVID-19 07/19/2019  . Acute on chronic respiratory failure with hypoxia ([place])   . Atrial fibrillation with RVR ([place])   . Postobstructive pneumonia 07/09/2019  . Atrial flutter with rapid ventricular response ([place]) 06/17/2019  . Left leg swelling 05/14/2018  . Elevated hemoglobin A1c 05/02/2018  . Abdominal aortic aneurysm (AAA) 3.0 cm to 5.0 cm in diameter in female ([place]) 06/30/2017  . Abdominal aortic atherosclerosis ([place]) 06/30/2017  . Chronic venous insufficiency 04/11/2016  . Varicose veins of bilateral lower extremities with pain 03/12/2016  . Cancer of lower lobe of right lung ([place]) 10/09/2015  . Oropharyngeal candidiasis 07/12/2015  . Malignant neoplasm of hilus of left lung ([place])   . Personal history of tobacco use, presenting hazards to health 01/16/2015  . Airway hyperreactivity 11/15/2014  . CN (constipation) 11/15/2014  . COPD with acute exacerbation ([place]) 11/15/2014  . Daytime somnolence 11/15/2014  . Clinical depression 11/15/2014  . DD (diverticular disease) 11/15/2014  .  Accumulation of fluid in tissues 11/15/2014  . H/O peptic ulcer 11/15/2014  . Osteopenia 11/15/2014  . Awareness of heartbeats 11/15/2014  . Episodic paroxysmal anxiety disorder 11/15/2014  . Apnea, sleep 11/15/2014  . Snores 11/15/2014  . Breath shortness 11/15/2014  . Compulsive tobacco user syndrome 11/15/2014  . Avitaminosis D 11/15/2014  . Barrett esophagus 10/19/2014  . Acid reflux 10/19/2014  . COPD, moderate ([place]) 08/31/2013  . Dyspnea 08/31/2013  . Otitis, externa, infective 08/31/2013  . Nocturnal hypoxemia 08/31/2013  . Hypercholesterolemia without hypertriglyceridemia 11/05/2005   PCP:  Mar Daring, PA-C Pharmacy:   [facility] [address], [place] - [place] [address] [place] [address] Phone: [phone] Fax: [phone]     Social Determinants of Health (SDOH) Interventions    Readmission Risk Interventions Readmission Risk Prevention Plan 07/21/2019  Transportation Screening Complete  Medication Review ([place]) Complete  PCP or Specialist appointment within 3-5 days of discharge Complete  HRI or [place] Complete  Some recent data might be hidden

## 2019-07-21 NOTE — Progress Notes (Signed)
CRITICAL CARE PROGRESS NOTE    Name: Cathy Watts MRN: [id number] DOB: 08/16/1945     LOS: 2   SUBJECTIVE FINDINGS & SIGNIFICANT EVENTS   Patient description:  74 yo F with hx of COPD, sq cell lung cancer or right lung, came in hypoxic with spO2 <70%, tachycardic with +COVID test.   Lines / Drains: PIVx2  Right port-aCath-currently not accessed  Cultures / Sepsis markers: COVID19+   Antibiotics: cefepimeX1 in ED no on Bactrim po bid   Protocols / Consultants: Hospitalist, cardio, pccm    Overnight: 3/17Patient with severe anxiety - partially improved with 2mg  IV ativan, with prolonged QTC reducing options for mood stabilizers. 3/18- significant improvement of hypoxemia now on 3L [place], plan to optimize for stepdown and then medical floor  PAST MEDICAL HISTORY   Past Medical History:  Diagnosis Date  . Abdominal aortic aneurysm (AAA) 3.0 cm to 5.0 cm in diameter in female [facility]) 05/2018   being followed by dr. Delana Meyer. will reultrasound in 1 year  . Anxiety   . Asthma   . Barrett's esophagus   . COPD (chronic obstructive pulmonary disease) ([place])   . Depression   . GERD (gastroesophageal reflux disease)   . History of peptic ulcer disease   . Hypercholesterolemia   . Low oxygen saturation    2l hs  . Osteopenia   . Panic disorder   . Personal history of radiation therapy   . Personal history of tobacco use, presenting hazards to health 01/16/2015  . Requires supplemental oxygen 05/2018   supposed to use 2L NP at night but she currently does not.  encouraged patient to resume this  . Sleep apnea    snores significantly but has never been tested for OSA  . SOB (shortness of breath)   . 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 CERVICAL CONE BIOPSY  1990  . CHOLECYSTECTOMY  1980's  . colonoscopy  2014  . COLONOSCOPY  07/21/2012  . COLONOSCOPY WITH PROPOFOL N/A 05/12/2018   Procedure: COLONOSCOPY WITH PROPOFOL;  Surgeon: Lollie Sails, MD;  Location: [facility] ENDOSCOPY;  Service: Endoscopy;  Laterality: N/A;  . ELBOW FRACTURE SURGERY Right 2009  . ENDOBRONCHIAL ULTRASOUND N/A 02/07/2015   Procedure: ENDOBRONCHIAL ULTRASOUND;  Surgeon: Flora Lipps, MD;  Location: [facility] ORS;  Service: Cardiopulmonary;  Laterality: N/A;  . ENDOBRONCHIAL ULTRASOUND Left 06/05/2018   Procedure: ENDOBRONCHIAL ULTRASOUND;  Surgeon: Flora Lipps, MD;  Location: [facility] ORS;  Service: Cardiopulmonary;  Laterality: Left;  . ESOPHAGOGASTRODUODENOSCOPY (EGD) WITH PROPOFOL N/A 02/26/2016   Procedure: ESOPHAGOGASTRODUODENOSCOPY (EGD) WITH PROPOFOL;  Surgeon: Lollie Sails, MD;  Location: [facility] ENDOSCOPY;  Service: Endoscopy;  Laterality: N/A;  COPD, Sleep Apnea  . ESOPHAGOGASTRODUODENOSCOPY (EGD) WITH PROPOFOL N/A 05/12/2018   Procedure: ESOPHAGOGASTRODUODENOSCOPY (EGD) WITH PROPOFOL;  Surgeon: Lollie Sails, MD;  Location: [facility] ENDOSCOPY;  Service: Endoscopy;  Laterality: N/A;  . EYE SURGERY Bilateral    cataract extractions  . FRACTURE SURGERY  2005   elbow repair  . PORTA CATH INSERTION N/A 06/17/2018   Procedure: PORTA CATH INSERTION;  Surgeon: Katha Cabal, MD;  Location: [place] CV LAB;  Service: Cardiovascular;  Laterality: N/A;  . TONSILLECTOMY  1979  . TUBAL LIGATION  1970's  . UPPER GI ENDOSCOPY       FAMILY HISTORY   Family History  Problem Relation Age of Onset  . Colon  cancer Mother        colon  . Diabetes Mother   . Arthritis Mother   . Glaucoma Mother   . Stomach cancer Father        stomach  . Throat cancer Brother        throat  . Breast cancer Sister        breast  . Diabetes Sister   . Cirrhosis Sister   . Cancer Paternal Aunt   . Parkinson's disease Sister   .  Heart attack Sister   . Ovarian cancer Sister      SOCIAL HISTORY   Social History   Tobacco Use  . Smoking status: Former Smoker    Packs/day: 1.00    Years: 55.00    Pack years: 55.00    Types: Cigarettes    Quit date: 11/02/2017    Years since quitting: 1.7  . Smokeless tobacco: Never Used  Substance Use Topics  . Alcohol use: Yes    Alcohol/week: 0.0 standard drinks    Comment: occasionally- wine  . Drug use: No     MEDICATIONS   Current Medication:  Current Facility-Administered Medications:  .  0.9 %  sodium chloride infusion, 250 mL, Intravenous, Continuous, Blakeney, Dreama Saa, NP .  acetaminophen (TYLENOL) tablet 650 mg, 650 mg, Oral, Q6H PRN, Mansy, Jan A, MD .  albuterol (VENTOLIN HFA) 108 (90 Base) MCG/ACT inhaler 2 puff, 2 puff, Inhalation, Q4H PRN, Ottie Glazier, MD, 2 puff at 07/20/19 0955 .  apixaban (ELIQUIS) tablet 5 mg, 5 mg, Oral, BID, Mansy, Jan A, MD, 5 mg at 07/21/19 0853 .  aspirin EC tablet 81 mg, 81 mg, Oral, Daily, Mansy, Jan A, MD, 81 mg at 07/21/19 0853 .  busPIRone (BUSPAR) tablet 30 mg, 30 mg, Oral, BID, Mansy, Jan A, MD, 30 mg at 07/21/19 0854 .  cholecalciferol (VITAMIN D3) tablet 1,000 Units, 1,000 Units, Oral, Daily, Mansy, Arvella Merles, MD, 1,000 Units at 07/21/19 0851 .  dexamethasone (DECADRON) injection 6 mg, 6 mg, Intravenous, Q24H, Mansy, Jan A, MD, 6 mg at 07/21/19 0854 .  diltiazem (CARDIZEM CD) 24 hr capsule 360 mg, 360 mg, Oral, Daily, Shamar Engelmann, MD, 360 mg at 07/21/19 0852 .  escitalopram (LEXAPRO) tablet 20 mg, 20 mg, Oral, Daily, Mansy, Jan A, MD, 20 mg at 07/21/19 0852 .  famotidine (PEPCID) tablet 20 mg, 20 mg, Oral, BID, Mansy, Jan A, MD, 20 mg at 07/21/19 0852 .  furosemide (LASIX) tablet 20 mg, 20 mg, Oral, Daily, Mansy, Jan A, MD, 20 mg at 07/21/19 0854 .  guaiFENesin-dextromethorphan (ROBITUSSIN DM) 100-10 MG/5ML syrup 5 mL, 5 mL, Oral, Q4H PRN, Mansy, Jan A, MD .  Ipratropium-Albuterol (COMBIVENT) respimat 1 puff, 1  puff, Inhalation, Q6H, Ottie Glazier, MD, 1 puff at 07/21/19 1327 .  LORazepam (ATIVAN) tablet 1 mg, 1 mg, Oral, Q6H PRN, Ottie Glazier, MD, 1 mg at 07/19/19 1715 .  magnesium hydroxide (MILK OF MAGNESIA) suspension 30 mL, 30 mL, Oral, Daily PRN, Mansy, Jan A, MD .  magnesium oxide (MAG-OX) tablet 200 mg, 200 mg, Oral, BID, Mansy, Jan A, MD, 200 mg at 07/21/19 0853 .  mometasone-formoterol (DULERA) 100-5 MCG/ACT inhaler 2 puff, 2 puff, Inhalation, BID, Ottie Glazier, MD, 2 puff at 07/21/19 0848 .  morphine 2 MG/ML injection 1-2 mg, 1-2 mg, Intravenous, Q4H PRN, Awilda Bill, NP, 2 mg at 07/21/19 0035 .  norepinephrine (LEVOPHED) 4mg  in 269mL premix infusion, 2-10 mcg/min, Intravenous, Titrated, Blakeney, Dreama Saa, NP, Last  Rate: 7.5 mL/hr at 07/20/19 0344, 2 mcg/min at 07/20/19 0344 .  pantoprazole (PROTONIX) EC tablet 40 mg, 40 mg, Oral, Daily, Mansy, Jan A, MD, 40 mg at 07/21/19 0853 .  polyethylene glycol (MIRALAX / GLYCOLAX) packet 17 g, 17 g, Oral, Daily, Val Riles, MD, 17 g at 07/21/19 0955 .  pravastatin (PRAVACHOL) tablet 20 mg, 20 mg, Oral, QHS, Mansy, Jan A, MD, 20 mg at 07/20/19 2112 .  [COMPLETED] remdesivir 200 mg in sodium chloride 0.9% 250 mL IVPB, 200 mg, Intravenous, Once, Stopped at 07/19/19 0827 **FOLLOWED BY** remdesivir 100 mg in sodium chloride 0.9 % 100 mL IVPB, 100 mg, Intravenous, Daily, Mansy, Jan A, MD, Last Rate: 200 mL/hr at 07/21/19 0859, 100 mg at 07/21/19 0859 .  rOPINIRole (REQUIP) tablet 3 mg, 3 mg, Oral, QHS, Mansy, Jan A, MD, 3 mg at 07/20/19 2115 .  sodium chloride tablet 1 g, 1 g, Oral, BID WC, Sirena Riddle, MD, 1 g at 07/21/19 0854 .  sucralfate (CARAFATE) tablet 1 g, 1 g, Oral, TID, Mansy, Jan A, MD, 1 g at 07/21/19 0852 .  sulfamethoxazole-trimethoprim (BACTRIM) 400-80 MG per tablet 1 tablet, 1 tablet, Oral, Q12H, Ottie Glazier, MD, 1 tablet at 07/21/19 0854 .  traZODone (DESYREL) tablet 25 mg, 25 mg, Oral, QHS PRN, Mansy, Jan A, MD, 25 mg at  07/20/19 2115    ALLERGIES   Amoxicillin and Chlorhexidine    REVIEW OF SYSTEMS    10 point ROS with anxiety on report  PHYSICAL EXAMINATION   Vital Signs: Temp:  [95.1 F (35.1 C)-98.1 F (36.7 C)] 95.4 F (35.2 C) (03/17 1330) Pulse Rate:  [42-127] 85 (03/17 1330) Resp:  [9-23] 14 (03/17 1330) BP: (63-127)/(46-109) 112/53 (03/17 1300) SpO2:  [90 %-98 %] 96 % (03/17 1330)  GENERAL:mild distress due to viral pna HEAD: Normocephalic, atraumatic.  EYES: Pupils equal, round, reactive to light.  No scleral icterus.  MOUTH: Moist mucosal membrane. NECK: Supple. No thyromegaly. No nodules. No JVD.  PULMONARY: rhochorous bs bilaterally CARDIOVASCULAR: S1 and S2. Regular rate and rhythm. No murmurs, rubs, or gallops.  GASTROINTESTINAL: Soft, nontender, non-distended. No masses. Positive bowel sounds. No hepatosplenomegaly.  MUSCULOSKELETAL: No swelling, clubbing, or edema.  NEUROLOGIC: Mild distress due to acute illness SKIN:intact,warm,dry   PERTINENT DATA     Infusions: . sodium chloride    . norepinephrine (LEVOPHED) Adult infusion 2 mcg/min (07/20/19 0344)  . remdesivir 100 mg in NS 100 mL 100 mg (07/21/19 0859)   Scheduled Medications: . apixaban  5 mg Oral BID  . aspirin EC  81 mg Oral Daily  . busPIRone  30 mg Oral BID  . cholecalciferol  1,000 Units Oral Daily  . dexamethasone (DECADRON) injection  6 mg Intravenous Q24H  . diltiazem  360 mg Oral Daily  . escitalopram  20 mg Oral Daily  . famotidine  20 mg Oral BID  . furosemide  20 mg Oral Daily  . Ipratropium-Albuterol  1 puff Inhalation Q6H  . magnesium oxide  200 mg Oral BID  . mometasone-formoterol  2 puff Inhalation BID  . pantoprazole  40 mg Oral Daily  . polyethylene glycol  17 g Oral Daily  . pravastatin  20 mg Oral QHS  . rOPINIRole  3 mg Oral QHS  . sodium chloride  1 g Oral BID WC  . sucralfate  1 g Oral TID  . sulfamethoxazole-trimethoprim  1 tablet Oral Q12H   PRN  Medications: acetaminophen, albuterol, guaiFENesin-dextromethorphan, LORazepam, magnesium hydroxide, morphine injection, traZODone  Hemodynamic parameters:   Intake/Output: 03/16 0701 - 03/17 0700 In: 1178.5 [P.O.:450; I.V.:638.3; IV Piggyback:90.2] Out: 2350 [Urine:2350]  Ventilator  Settings:      LAB RESULTS:  Basic Metabolic Panel: Recent Labs  Lab 07/15/19 0349 07/15/19 0349 07/19/19 0125 07/19/19 0125 07/20/19 0422 07/21/19 0742  NA 126*  --  126*  --  130* 136  K 4.6   < > 4.4   < > 4.6 4.7  CL 89*  --  88*  --  90* 96*  CO2 30  --  25  --  29 29  GLUCOSE 151*  --  138*  --  153* 149*  BUN 17  --  20  --  17 18  CREATININE 0.56  --  0.73  --  0.59 0.72  CALCIUM 8.7*  --  8.8*  --  9.0 8.8*  MG  --   --   --   --  2.2 2.2   < > = values in this interval not displayed.   Liver Function Tests: Recent Labs  Lab 07/20/19 0422 07/21/19 0742  AST 29 22  ALT 35 38  ALKPHOS 72 64  BILITOT 0.6 0.4  PROT 6.2* 6.0*  ALBUMIN 3.1* 3.1*   No results for input(s): LIPASE, AMYLASE in the last 168 hours. No results for input(s): AMMONIA in the last 168 hours. CBC: Recent Labs  Lab 07/15/19 0349 07/19/19 0125 07/20/19 0422 07/21/19 0742  WBC 9.2 12.5* 10.2 11.7*  NEUTROABS  --  8.4* 8.1* 9.2*  HGB 10.8* 11.7* 10.5* 10.6*  HCT 32.9* 37.3 32.0* 33.6*  MCV 87.0 88.0 86.7 88.9  PLT 353 433* 339 347   Cardiac Enzymes: No results for input(s): CKTOTAL, CKMB, CKMBINDEX, TROPONINI in the last 168 hours. BNP: Invalid input(s): POCBNP CBG: Recent Labs  Lab 07/19/19 0913  GLUCAP 166*     IMAGING RESULTS:  Imaging: No results found.    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 ICU monitoring -workup in process -gentle rehydration -will start salt tabs   GI/Nutrition GI PROPHYLAXIS as indicated DIET-->TF's as tolerated Constipation protocol as indicated  ENDO - ICU hypoglycemic\Hyperglycemia protocol -check FSBS per protocol   ELECTROLYTES -follow labs as needed -replace as needed -pharmacy consultation   DVT/GI PRX ordered -SCDs  TRANSFUSIONS AS NEEDED MONITOR FSBS ASSESS the need for LABS as needed   Critical care provider statement:    Critical care time (minutes):  33   Critical care time was exclusive of:  Separately billable procedures and treating other patients   Critical care was necessary to treat or prevent imminent or life-threatening deterioration of the following conditions:  acute on chronic hypoxemic respiratory failure, encephalopathy, pneumonia, lung cancer, multiple comorbid conditions.    Critical care was time spent personally by me on the following activities:  Development of treatment plan with patient or surrogate, discussions with consultants, evaluation of patient's response to  treatment, examination of patient, obtaining history from patient or surrogate, ordering and performing treatments and interventions, ordering and review of laboratory studies and re-evaluation of patient's condition.  I assumed direction of critical care for this patient from another provider in my specialty: no    This document was prepared using Dragon voice recognition software and may include unintentional dictation errors.    Ottie Glazier, M.D.  Division of [facility]

## 2019-07-21 NOTE — Consult Note (Signed)
[facility] Face-to-Face Psychiatry Consult   Reason for Consult: Visual hallucinations Referring Physician: Dr. Lanney Gins Patient Identification: Cathy Watts MRN:  [id number] Principal Diagnosis: <principal problem not specified> Diagnosis:  Active Problems:   COPD exacerbation ([place])   COVID-19   Acute on chronic respiratory failure with hypoxia ([place])   Atrial fibrillation with RVR ([place])   Goals of care, counseling/discussion   Palliative care by specialist   Total Time spent with patient: 30 minutes  Subjective:   Cathy Watts is a 74 y.o. female patient who was admitted for a COPD exacerbation, found to have COVID-19, and psychiatry was consulted due to complaints of visual hallucination/bizarre mood.Marland Kitchen  HPI: Patient is a 74 year old woman with no significant past psychiatric history who presents with a COPD exacerbation.  During the course of her hospitalization patient required ICU admission.  During her time in the ICU patient experienced a visual hallucinations and some bizarre behavior.  Patient has recollection of this event and states that she is embarrassed and remorseful for her actions.  She states that she remembers waking up and seeing everything blank.  She also remembers having trying to exit the room via the wall.  Patient also recalls having had stripped naked.  Patient unable to verbalize exactly what she saw when she had visual hallucinations but does describe seeing as if the world went blank.  Patient denies any of this having happened to her in the past.  And she has not had anything happen similar to this since that one episode Monday morning approximately 2 days ago.  Patient states that she has been taking her current regimen for quite some time and expresses some concern as to whether or not this incident will happen again.  She is however reporting feeling better from a medical standpoint and is looking forward to progressing her way out of the hospital when  appropriate to do so.  Patient denies any history of psychotic illness.  She denies any suicidal homicidal ideation.  She denies any family history of psychotic illness.  She denies any prior episodes of delirium or dementia diagnosis prior to this hospitalization.  Past Psychiatric History: Noncontributory, patient does have a history of generalized anxiety and depression but has never had to see a psychiatrist before being psychiatrically hospitalized.  She denies any prior suicide attempts.  She states that her medication regimen is currently managed by her primary care doctor.  Risk to Self:  No Risk to Others:  No Prior Inpatient Therapy:  No Prior Outpatient Therapy:  No  Past Medical History:  Past Medical History:  Diagnosis Date  . Abdominal aortic aneurysm (AAA) 3.0 cm to 5.0 cm in diameter in female [facility]) 05/2018   being followed by dr. Delana Meyer. will reultrasound in 1 year  . Anxiety   . Asthma   . Barrett's esophagus   . COPD (chronic obstructive pulmonary disease) ([place])   . Depression   . GERD (gastroesophageal reflux disease)   . History of peptic ulcer disease   . Hypercholesterolemia   . Low oxygen saturation    2l hs  . Osteopenia   . Panic disorder   . Personal history of radiation therapy   . Personal history of tobacco use, presenting hazards to health 01/16/2015  . Requires supplemental oxygen 05/2018   supposed to use 2L NP at night but she currently does not.  encouraged patient to resume this  . Sleep apnea    snores significantly but has never been tested  for OSA  . SOB (shortness of breath)   . 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 CERVICAL CONE BIOPSY  1990  . CHOLECYSTECTOMY  1980's  . colonoscopy  2014  . COLONOSCOPY  07/21/2012  . COLONOSCOPY WITH PROPOFOL N/A 05/12/2018   Procedure: COLONOSCOPY WITH PROPOFOL;  Surgeon: Lollie Sails, MD;  Location: [facility] ENDOSCOPY;  Service:  Endoscopy;  Laterality: N/A;  . ELBOW FRACTURE SURGERY Right 2009  . ENDOBRONCHIAL ULTRASOUND N/A 02/07/2015   Procedure: ENDOBRONCHIAL ULTRASOUND;  Surgeon: Flora Lipps, MD;  Location: [facility] ORS;  Service: Cardiopulmonary;  Laterality: N/A;  . ENDOBRONCHIAL ULTRASOUND Left 06/05/2018   Procedure: ENDOBRONCHIAL ULTRASOUND;  Surgeon: Flora Lipps, MD;  Location: [facility] ORS;  Service: Cardiopulmonary;  Laterality: Left;  . ESOPHAGOGASTRODUODENOSCOPY (EGD) WITH PROPOFOL N/A 02/26/2016   Procedure: ESOPHAGOGASTRODUODENOSCOPY (EGD) WITH PROPOFOL;  Surgeon: Lollie Sails, MD;  Location: [facility] ENDOSCOPY;  Service: Endoscopy;  Laterality: N/A;  COPD, Sleep Apnea  . ESOPHAGOGASTRODUODENOSCOPY (EGD) WITH PROPOFOL N/A 05/12/2018   Procedure: ESOPHAGOGASTRODUODENOSCOPY (EGD) WITH PROPOFOL;  Surgeon: Lollie Sails, MD;  Location: [facility] ENDOSCOPY;  Service: Endoscopy;  Laterality: N/A;  . EYE SURGERY Bilateral    cataract extractions  . FRACTURE SURGERY  2005   elbow repair  . PORTA CATH INSERTION N/A 06/17/2018   Procedure: PORTA CATH INSERTION;  Surgeon: Katha Cabal, MD;  Location: [place] CV LAB;  Service: Cardiovascular;  Laterality: N/A;  . TONSILLECTOMY  1979  . TUBAL LIGATION  1970's  . UPPER GI ENDOSCOPY     Family History:  Family History  Problem Relation Age of Onset  . Colon cancer Mother        colon  . Diabetes Mother   . Arthritis Mother   . Glaucoma Mother   . Stomach cancer Father        stomach  . Throat cancer Brother        throat  . Breast cancer Sister        breast  . Diabetes Sister   . Cirrhosis Sister   . Cancer Paternal Aunt   . Parkinson's disease Sister   . Heart attack Sister   . Ovarian cancer Sister    Family Psychiatric  History: Denies Social History:  Social History   Substance and Sexual Activity  Alcohol Use Yes  . Alcohol/week: 0.0 standard drinks   Comment: occasionally- wine     Social History   Substance and Sexual Activity   Drug Use No    Social History   Socioeconomic History  . Marital status: Widowed    Spouse name: Not on file  . Number of children: 3  . Years of education: Not on file  . Highest education level: 12th grade  Occupational History  . Occupation: Agricultural consultant    Comment: retired  Tobacco Use  . Smoking status: Former Smoker    Packs/day: 1.00    Years: 55.00    Pack years: 55.00    Types: Cigarettes    Quit date: 11/02/2017    Years since quitting: 1.7  . Smokeless tobacco: Never Used  Substance and Sexual Activity  . Alcohol use: Yes    Alcohol/week: 0.0 standard drinks    Comment: occasionally- wine  . Drug use: No  . Sexual activity: Not Currently  Other Topics Concern  . Not on file  Social History Narrative   Grandson lives with patient. Is  available to help her as needed.   Social Determinants of Health   Financial Resource Strain:   . Difficulty of Paying Living Expenses:   Food Insecurity:   . Worried About Charity fundraiser in the Last Year:   . Arboriculturist in the Last Year:   Transportation Needs:   . Film/video editor (Medical):   Marland Kitchen Lack of Transportation (Non-Medical):   Physical Activity: Inactive  . Days of Exercise per Week: 0 days  . Minutes of Exercise per Session: 0 min  Stress:   . Feeling of Stress :   Social Connections: Unknown  . Frequency of Communication with Friends and Family: Patient refused  . Frequency of Social Gatherings with Friends and Family: Patient refused  . Attends Religious Services: Patient refused  . Active Member of Clubs or Organizations: Patient refused  . Attends Archivist Meetings: Patient refused  . Marital Status: Patient refused   Additional Social History:    Allergies:   Allergies  Allergen Reactions  . Amoxicillin Hives and Itching    Did it involve swelling of the face/tongue/throat, SOB, or low BP? No Did it involve sudden or severe rash/hives, skin peeling,  or any reaction on the inside of your mouth or nose? No Did you need to seek medical attention at a hospital or doctor's office? No When did it last happen?2018 If all above answers are "NO", may proceed with cephalosporin use.     . Chlorhexidine Dermatitis    Patient refused    Labs:  Results for orders placed or performed during the hospital encounter of 07/19/19 (from the past 48 hour(s))  CBC with Differential/Platelet     Status: Abnormal   Collection Time: 07/20/19  4:22 AM  Result Value Ref Range   WBC 10.2 4.0 - 10.5 K/uL   RBC 3.69 (L) 3.87 - 5.11 MIL/uL   Hemoglobin 10.5 (L) 12.0 - 15.0 g/dL   HCT 32.0 (L) 36.0 - 46.0 %   MCV 86.7 80.0 - 100.0 fL   MCH 28.5 26.0 - 34.0 pg   MCHC 32.8 30.0 - 36.0 g/dL   RDW 15.5 11.5 - 15.5 %   Platelets 339 150 - 400 K/uL   nRBC 0.0 0.0 - 0.2 %   Neutrophils Relative % 80 %   Neutro Abs 8.1 (H) 1.7 - 7.7 K/uL   Lymphocytes Relative 8 %   Lymphs Abs 0.8 0.7 - 4.0 K/uL   Monocytes Relative 9 %   Monocytes Absolute 1.0 0.1 - 1.0 K/uL   Eosinophils Relative 0 %   Eosinophils Absolute 0.0 0.0 - 0.5 K/uL   Basophils Relative 0 %   Basophils Absolute 0.0 0.0 - 0.1 K/uL   Immature Granulocytes 3 %   Abs Immature Granulocytes 0.35 (H) 0.00 - 0.07 K/uL    Comment: Performed at [facility], [address] [place], [place] [address]  Comprehensive metabolic panel     Status: Abnormal   Collection Time: 07/20/19  4:22 AM  Result Value Ref Range   Sodium 130 (L) 135 - 145 mmol/L   Potassium 4.6 3.5 - 5.1 mmol/L   Chloride 90 (L) 98 - 111 mmol/L   CO2 29 22 - 32 mmol/L   Glucose, Bld 153 (H) 70 - 99 mg/dL    Comment: Glucose reference range applies only to samples taken after fasting for at least 8 hours.   BUN 17 8 - 23 mg/dL   Creatinine, Ser 0.59  0.44 - 1.00 mg/dL   Calcium 9.0 8.9 - 10.3 mg/dL   Total Protein 6.2 (L) 6.5 - 8.1 g/dL   Albumin 3.1 (L) 3.5 - 5.0 g/dL   AST 29 15 - 41 U/L   ALT 35 0 - 44 U/L    Alkaline Phosphatase 72 38 - 126 U/L   Total Bilirubin 0.6 0.3 - 1.2 mg/dL   GFR calc non Af Amer >60 >60 mL/min   GFR calc Af Amer >60 >60 mL/min   Anion gap 11 5 - 15    Comment: Performed at [facility], [address] [place], [place] [address]  C-reactive protein     Status: Abnormal   Collection Time: 07/20/19  4:22 AM  Result Value Ref Range   CRP 2.8 (H) <1.0 mg/dL    Comment: Performed at [facility] [address], [place], [place] [address]  Fibrin derivatives D-Dimer [facility] only)     Status: Abnormal   Collection Time: 07/20/19  4:22 AM  Result Value Ref Range   Fibrin derivatives D-dimer ([facility]) 1,244.75 (H) 0.00 - 499.00 ng/mL (FEU)    Comment: (NOTE) <> Exclusion of Venous Thromboembolism (VTE) - OUTPATIENT ONLY   (Emergency Department or [place])   0-499 ng/ml (FEU): With a low to intermediate pretest probability                      for VTE this test result excludes the diagnosis                      of VTE.   >499 ng/ml (FEU) : VTE not excluded; additional work up for VTE is                      required. <> Testing on Inpatients and Evaluation of Disseminated Intravascular   Coagulation (DIC) Reference Range:   0-499 ng/ml (FEU) Performed at [facility], [address] [place], [place] [address]   Ferritin     Status: None   Collection Time: 07/20/19  4:22 AM  Result Value Ref Range   Ferritin 103 11 - 307 ng/mL    Comment: Performed at [facility], [address] [place], [place] [address]  Magnesium     Status: None   Collection Time: 07/20/19  4:22 AM  Result Value Ref Range   Magnesium 2.2 1.7 - 2.4 mg/dL    Comment: Performed at [facility], [address] [place], [place] [address]  Osmolality, urine     Status: Abnormal   Collection Time: 07/20/19  5:06 AM  Result Value Ref Range   Osmolality, Ur 235 (L) 300 - 900 mOsm/kg    Comment: Performed at [facility], [address]  [place], [place] [address]  Na and K (sodium & potassium), rand urine     Status: None   Collection Time: 07/20/19  5:06 AM  Result Value Ref Range   Sodium, Ur 28 mmol/L   Potassium Urine 18 mmol/L    Comment: Performed at [facility], [address] [place], [place] [address]  CBC with Differential/Platelet     Status: Abnormal   Collection Time: 07/21/19  7:42 AM  Result Value Ref Range   WBC 11.7 (H) 4.0 - 10.5 K/uL   RBC 3.78 (L) 3.87 - 5.11 MIL/uL   Hemoglobin 10.6 (L) 12.0 - 15.0 g/dL   HCT 33.6 (L) 36.0 - 46.0 %  MCV 88.9 80.0 - 100.0 fL   MCH 28.0 26.0 - 34.0 pg   MCHC 31.5 30.0 - 36.0 g/dL   RDW 15.9 (H) 11.5 - 15.5 %   Platelets 347 150 - 400 K/uL   nRBC 0.0 0.0 - 0.2 %   Neutrophils Relative % 79 %   Neutro Abs 9.2 (H) 1.7 - 7.7 K/uL   Lymphocytes Relative 6 %   Lymphs Abs 0.8 0.7 - 4.0 K/uL   Monocytes Relative 12 %   Monocytes Absolute 1.4 (H) 0.1 - 1.0 K/uL   Eosinophils Relative 0 %   Eosinophils Absolute 0.0 0.0 - 0.5 K/uL   Basophils Relative 0 %   Basophils Absolute 0.0 0.0 - 0.1 K/uL   Immature Granulocytes 3 %   Abs Immature Granulocytes 0.34 (H) 0.00 - 0.07 K/uL    Comment: Performed at [facility], [address], [place], [place] [address]  Comprehensive metabolic panel     Status: Abnormal   Collection Time: 07/21/19  7:42 AM  Result Value Ref Range   Sodium 136 135 - 145 mmol/L   Potassium 4.7 3.5 - 5.1 mmol/L   Chloride 96 (L) 98 - 111 mmol/L   CO2 29 22 - 32 mmol/L   Glucose, Bld 149 (H) 70 - 99 mg/dL    Comment: Glucose reference range applies only to samples taken after fasting for at least 8 hours.   BUN 18 8 - 23 mg/dL   Creatinine, Ser 0.72 0.44 - 1.00 mg/dL   Calcium 8.8 (L) 8.9 - 10.3 mg/dL   Total Protein 6.0 (L) 6.5 - 8.1 g/dL   Albumin 3.1 (L) 3.5 - 5.0 g/dL   AST 22 15 - 41 U/L   ALT 38 0 - 44 U/L   Alkaline Phosphatase 64 38 - 126 U/L   Total Bilirubin 0.4 0.3 - 1.2 mg/dL   GFR calc non Af Amer >60 >60 mL/min    GFR calc Af Amer >60 >60 mL/min   Anion gap 11 5 - 15    Comment: Performed at [facility], [address] [place], [place] [address]  C-reactive protein     Status: Abnormal   Collection Time: 07/21/19  7:42 AM  Result Value Ref Range   CRP 1.1 (H) <1.0 mg/dL    Comment: Performed at [facility] [address], [place], [place] [address]  Fibrin derivatives D-Dimer [facility] only)     Status: Abnormal   Collection Time: 07/21/19  7:42 AM  Result Value Ref Range   Fibrin derivatives D-dimer ([facility]) 845.89 (H) 0.00 - 499.00 ng/mL (FEU)    Comment: (NOTE) <> Exclusion of Venous Thromboembolism (VTE) - OUTPATIENT ONLY   (Emergency Department or [place])   0-499 ng/ml (FEU): With a low to intermediate pretest probability                      for VTE this test result excludes the diagnosis                      of VTE.   >499 ng/ml (FEU) : VTE not excluded; additional work up for VTE is                      required. <> Testing on Inpatients and Evaluation of Disseminated Intravascular   Coagulation (DIC) Reference Range:   0-499 ng/ml (FEU) Performed at [facility], [address]  [place], [place] [address]   Ferritin     Status: None   Collection Time: 07/21/19  7:42 AM  Result Value Ref Range   Ferritin 103 11 - 307 ng/mL    Comment: Performed at [facility], [address] [place], [place] [address]  Magnesium     Status: None   Collection Time: 07/21/19  7:42 AM  Result Value Ref Range   Magnesium 2.2 1.7 - 2.4 mg/dL    Comment: Performed at [facility], [address], [place], [place] [address]    Current Facility-Administered Medications  Medication Dose Route Frequency Provider Last Rate Last Admin  . 0.9 %  sodium chloride infusion  250 mL Intravenous Continuous Awilda Bill, NP      . acetaminophen (TYLENOL) tablet 650 mg  650 mg Oral Q6H PRN Mansy, Jan A, MD      . albuterol (VENTOLIN HFA) 108 (90 Base) MCG/ACT  inhaler 2 puff  2 puff Inhalation Q4H PRN Ottie Glazier, MD   2 puff at 07/20/19 0955  . apixaban (ELIQUIS) tablet 5 mg  5 mg Oral BID Mansy, Jan A, MD   5 mg at 07/21/19 0853  . aspirin EC tablet 81 mg  81 mg Oral Daily Mansy, Jan A, MD   81 mg at 07/21/19 0853  . busPIRone (BUSPAR) tablet 30 mg  30 mg Oral BID Mansy, Jan A, MD   30 mg at 07/21/19 0854  . cholecalciferol (VITAMIN D3) tablet 1,000 Units  1,000 Units Oral Daily Mansy, Arvella Merles, MD   1,000 Units at 07/21/19 0851  . dexamethasone (DECADRON) injection 6 mg  6 mg Intravenous Q24H Mansy, Jan A, MD   6 mg at 07/21/19 0854  . diltiazem (CARDIZEM CD) 24 hr capsule 360 mg  360 mg Oral Daily Ottie Glazier, MD   360 mg at 07/21/19 0852  . escitalopram (LEXAPRO) tablet 20 mg  20 mg Oral Daily Mansy, Jan A, MD   20 mg at 07/21/19 5462  . famotidine (PEPCID) tablet 20 mg  20 mg Oral BID Mansy, Jan A, MD   20 mg at 07/21/19 7035  . furosemide (LASIX) tablet 20 mg  20 mg Oral Daily Mansy, Jan A, MD   20 mg at 07/21/19 0854  . guaiFENesin-dextromethorphan (ROBITUSSIN DM) 100-10 MG/5ML syrup 5 mL  5 mL Oral Q4H PRN Mansy, Jan A, MD      . Ipratropium-Albuterol (COMBIVENT) respimat 1 puff  1 puff Inhalation Q6H Ottie Glazier, MD   1 puff at 07/21/19 1327  . LORazepam (ATIVAN) tablet 1 mg  1 mg Oral Q6H PRN Ottie Glazier, MD   1 mg at 07/19/19 1715  . magnesium hydroxide (MILK OF MAGNESIA) suspension 30 mL  30 mL Oral Daily PRN Mansy, Jan A, MD      . magnesium oxide (MAG-OX) tablet 200 mg  200 mg Oral BID Mansy, Jan A, MD   200 mg at 07/21/19 0853  . mometasone-formoterol (DULERA) 100-5 MCG/ACT inhaler 2 puff  2 puff Inhalation BID Ottie Glazier, MD   2 puff at 07/21/19 0848  . morphine 2 MG/ML injection 1-2 mg  1-2 mg Intravenous Q4H PRN Awilda Bill, NP   2 mg at 07/21/19 0035  . norepinephrine (LEVOPHED) 4mg  in 240mL premix infusion  2-10 mcg/min Intravenous Titrated Awilda Bill, NP 7.5 mL/hr at 07/20/19 0344 2 mcg/min at 07/20/19  0344  . pantoprazole (PROTONIX) EC tablet 40 mg  40 mg Oral Daily Mansy,  Arvella Merles, MD   40 mg at 07/21/19 0853  . polyethylene glycol (MIRALAX / GLYCOLAX) packet 17 g  17 g Oral Daily Val Riles, MD   17 g at 07/21/19 0955  . pravastatin (PRAVACHOL) tablet 20 mg  20 mg Oral QHS Mansy, Jan A, MD   20 mg at 07/20/19 2112  . remdesivir 100 mg in sodium chloride 0.9 % 100 mL IVPB  100 mg Intravenous Daily Mansy, Jan A, MD 200 mL/hr at 07/21/19 0859 100 mg at 07/21/19 0859  . rOPINIRole (REQUIP) tablet 3 mg  3 mg Oral QHS Mansy, Jan A, MD   3 mg at 07/20/19 2115  . sodium chloride tablet 1 g  1 g Oral BID WC Ottie Glazier, MD   1 g at 07/21/19 1632  . sucralfate (CARAFATE) tablet 1 g  1 g Oral TID Mansy, Jan A, MD   1 g at 07/21/19 1632  . sulfamethoxazole-trimethoprim (BACTRIM) 400-80 MG per tablet 1 tablet  1 tablet Oral Q12H Ottie Glazier, MD   1 tablet at 07/21/19 0854  . traZODone (DESYREL) tablet 25 mg  25 mg Oral QHS PRN Mansy, Jan A, MD   25 mg at 07/20/19 2115    Musculoskeletal: Strength & Muscle Tone: decreased Gait & Station: normal Patient leans: N/A  Psychiatric Specialty Exam: Physical Exam  Constitutional: She appears well-developed and well-nourished.  HENT:  Head: Normocephalic.  Musculoskeletal:        General: Normal range of motion.     Cervical back: Normal range of motion.  Psychiatric: She has a normal mood and affect. Her behavior is normal. Judgment and thought content normal.    Review of Systems  Psychiatric/Behavioral: Positive for hallucinations. Negative for agitation, dysphoric mood and suicidal ideas. The patient is not hyperactive.     Blood pressure 100/63, pulse 85, temperature (!) 96.5 F (35.8 C), temperature source Oral, resp. rate 16, height 5\' 10"  (1.778 m), weight 116.7 kg, SpO2 94 %.Body mass index is 36.92 kg/m.  General Appearance: Casual  Eye Contact:  Good  Speech:  Clear and Coherent  Volume:  Normal  Mood:  Euthymic  Affect:   Appropriate  Thought Process:  Coherent  Orientation:  Full (Time, Place, and Person)  Thought Content:  Logical  Suicidal Thoughts:  No  Homicidal Thoughts:  No  Memory:  Recent;   Fair  Judgement:  Intact  Insight:  Fair  Psychomotor Activity:  Normal  Concentration:  Concentration: Fair  Recall:  [employer] of Knowledge:  Fair  Language:  Fair  Akathisia:  No  Handed:  Right  AIMS (if indicated):     Assets:  Communication Skills Desire for Improvement Leisure Time Resilience  ADL's:  Intact  Cognition:  WNL  Sleep:       Treatment Plan Summary: Patient with a episode of visual hallucinations and bizarre mood in the context of ICU hospitalization.  Patient is no longer experiencing the symptoms as they seem to been a one-time incident.  Most likely explanation for this would be delirium.  Patient did have numerous issues that can contribute to her delirium including infection/metabolic.  In addition to this patient is taking Requip which is a dopamine agonist and can potentially exacerbate psychotic symptoms in the setting of delirium. Patient was educated as to this potential side effect of the medication but was agreeable to continue the medication at this time.   Diagnosis: Delirium psychosis  Disposition: No evidence of imminent risk to  self or others at present.   Patient does not meet criteria for psychiatric inpatient admission. Supportive therapy provided about ongoing stressors. Discussed crisis plan, support from social network, calling 911, coming to the Emergency Department, and calling Suicide Hotline.  Dixie Dials, MD 07/21/2019 5:14 PM

## 2019-07-21 NOTE — Evaluation (Signed)
Physical Therapy Evaluation Patient Details Name: Cathy Watts MRN: [id number] DOB: 1945-05-17 Today's Date: 07/21/2019   History of Present Illness  presented to ER secondary to worsening cough, dyspnea, SOB; admitted for management of acute hypoxic respiratory failure with PNA due to COVID-19 infection.  Clinical Impression  Upon evaluation, patient alert and oriented to basic information; follows commands and participates well with session. Mildly impulsive with mobility efforts, but easily redirectable as needed.  Bilat UE/LE strength and ROM grossly symmetrical and WFL; no focal weakness appreciated.  Able to complete bed mobility with mod indep; sit/stand, basic transfers and gait (5' x2) with RW, cga/min assist.   Requires consistent cuing for walker position and management (patient often releasing grasp); significant SOB with minimal exertion with sats 84-85% on 3L (recovers <90% within 60 seconds seated rest). Unable to tolerate additional activity/distance at this time.  Will continue to assess/progress in subsequent sessions as appropriate. Would benefit from skilled PT to address above deficits and promote optimal return to PLOF.;d Recommend transition to HHPT upon discharge from acute hospitalization.     Follow Up Recommendations Home health PT    Equipment Recommendations  Rolling walker with 5" wheels    Recommendations for Other Services       Precautions / Restrictions Precautions Precautions: Fall Restrictions Weight Bearing Restrictions: No      Mobility  Bed Mobility Overal bed mobility: Modified Independent                Transfers Overall transfer level: Needs assistance Equipment used: Rolling walker (2 wheeled) Transfers: Sit to/from Stand Sit to Stand: Min guard         General transfer comment: cuing for hand placement to prevent pulling on RW  Ambulation/Gait Ambulation/Gait assistance: Min guard Gait Distance (Feet): (5'  x2) Assistive device: Rolling walker (2 wheeled)       General Gait Details: consistent cuing for walker position and management (patient often releasing grasp); significant SOB with minimal exertion with sats 84-85% on 3L (recovers <90% within 60 seconds seated rest). Unable to tolerate additional activity/distance at this time.  Stairs            Wheelchair Mobility    Modified Rankin (Stroke Patients Only)       Balance Overall balance assessment: Needs assistance Sitting-balance support: No upper extremity supported;Feet supported Sitting balance-Leahy Scale: Good     Standing balance support: Bilateral upper extremity supported Standing balance-Leahy Scale: Fair                               Pertinent Vitals/Pain Pain Assessment: No/denies pain    Home Living Family/patient expects to be discharged to:: Private residence Living Arrangements: Children Available Help at Discharge: Family;Available 24 hours/day Type of Home: House Home Access: Stairs to enter Entrance Stairs-Rails: Right;Left;Can reach both Entrance Stairs-Number of Steps: 4 Home Layout: One level Home Equipment: Walker - 2 wheels;Cane - single point      Prior Function           Comments: Indep with ADLs, household and community mobilization without assist device; home O2 at night; denies fall history.  Pt enjoys playing with her dog and cooking.  She was driving prior to admission.  Was using RW for household activities after last admission; was not home long enough for [facility] services to get started.     Hand Dominance        Extremity/Trunk Assessment  Upper Extremity Assessment Upper Extremity Assessment: Overall WFL for tasks assessed    Lower Extremity Assessment Lower Extremity Assessment: Overall WFL for tasks assessed(grossly 4+/5 throughout)       Communication   Communication: No difficulties  Cognition Arousal/Alertness: Awake/alert Behavior During  Therapy: WFL for tasks assessed/performed Overall Cognitive Status: Within Functional Limits for tasks assessed                                        General Comments      Exercises Other Exercises Other Exercises: Toilet transfer, SPT wiht RW, cga/min assist; mildly impulsive, cuing for safety and RW use/management.  Sit/stand from [facility] with RW, cga/min assist; standing balance with RW for hygiene, cga/close sup. Other Exercises: Instructed in pursed lip breathing, gradual resumption of activity, benefit of small bouts of activity as able (encouraged OOB to BSC vs purewick); patient voiced understanding and agreement.   Assessment/Plan    PT Assessment Patient needs continued PT services  PT Problem List Decreased activity tolerance;Cardiopulmonary status limiting activity;Obesity;Decreased balance;Decreased mobility;Decreased safety awareness;Decreased knowledge of use of DME       PT Treatment Interventions DME instruction;Gait training;Stair training;Functional mobility training;Therapeutic activities;Therapeutic exercise;Balance training;Neuromuscular re-education;Patient/family education    PT Goals (Current goals can be found in the Care Plan section)  Acute Rehab PT Goals Patient Stated Goal: to get back home PT Goal Formulation: With patient Time For Goal Achievement: 08/04/19 Potential to Achieve Goals: Good    Frequency Min 2X/week   Barriers to discharge        Co-evaluation               AM-PAC PT "6 Clicks" Mobility  Outcome Measure Help needed turning from your back to your side while in a flat bed without using bedrails?: None Help needed moving from lying on your back to sitting on the side of a flat bed without using bedrails?: None Help needed moving to and from a bed to a chair (including a wheelchair)?: A Little Help needed standing up from a chair using your arms (e.g., wheelchair or bedside chair)?: A Little Help needed to walk  in hospital room?: A Little Help needed climbing 3-5 steps with a railing? : A Little 6 Click Score: 20    End of Session Equipment Utilized During Treatment: Gait belt;Oxygen Activity Tolerance: Patient tolerated treatment well Patient left: in chair;with call bell/phone within reach Nurse Communication: Mobility status PT Visit Diagnosis: Unsteadiness on feet (R26.81);Muscle weakness (generalized) (M62.81);Difficulty in walking, not elsewhere classified (R26.2)    Time: 3382-5053 PT Time Calculation (min) (ACUTE ONLY): 25 min   Charges:   PT Evaluation $PT Eval Moderate Complexity: 1 Mod PT Treatments $Therapeutic Activity: 8-22 mins        Sanora Cunanan H. Owens Shark, PT, DPT, NCS 07/21/19, 3:31 PM [phone]

## 2019-07-22 ENCOUNTER — Telehealth: Payer: Self-pay | Admitting: Internal Medicine

## 2019-07-22 LAB — COMPREHENSIVE METABOLIC PANEL
ALT: 32 U/L (ref 0–44)
AST: 19 U/L (ref 15–41)
Albumin: 3.1 g/dL — ABNORMAL LOW (ref 3.5–5.0)
Alkaline Phosphatase: 58 U/L (ref 38–126)
Anion gap: 4 — ABNORMAL LOW (ref 5–15)
BUN: 15 mg/dL (ref 8–23)
CO2: 36 mmol/L — ABNORMAL HIGH (ref 22–32)
Calcium: 8.9 mg/dL (ref 8.9–10.3)
Chloride: 99 mmol/L (ref 98–111)
Creatinine, Ser: 0.65 mg/dL (ref 0.44–1.00)
GFR calc Af Amer: >60 mL/min (ref >60)
GFR calc non Af Amer: >60 mL/min (ref >60)
Glucose, Bld: 142 mg/dL — ABNORMAL HIGH (ref 70–99)
Potassium: 4.1 mmol/L (ref 3.5–5.1)
Sodium: 139 mmol/L (ref 135–145)
Total Bilirubin: 0.4 mg/dL (ref 0.3–1.2)
Total Protein: 5.9 g/dL — ABNORMAL LOW (ref 6.5–8.1)

## 2019-07-22 LAB — CBC WITH DIFFERENTIAL/PLATELET
Abs Immature Granulocytes: 0.51 10*3/uL — ABNORMAL HIGH (ref 0.00–0.07)
Basophils Absolute: 0 10*3/uL (ref 0.0–0.1)
Basophils Relative: 0 %
Eosinophils Absolute: 0 10*3/uL (ref 0.0–0.5)
Eosinophils Relative: 0 %
HCT: 32.9 % — ABNORMAL LOW (ref 36.0–46.0)
Hemoglobin: 10.7 g/dL — ABNORMAL LOW (ref 12.0–15.0)
Immature Granulocytes: 5 %
Lymphocytes Relative: 6 %
Lymphs Abs: 0.7 10*3/uL (ref 0.7–4.0)
MCH: 28.5 pg (ref 26.0–34.0)
MCHC: 32.5 g/dL (ref 30.0–36.0)
MCV: 87.5 fL (ref 80.0–100.0)
Monocytes Absolute: 1.3 10*3/uL — ABNORMAL HIGH (ref 0.1–1.0)
Monocytes Relative: 12 %
Neutro Abs: 8.5 10*3/uL — ABNORMAL HIGH (ref 1.7–7.7)
Neutrophils Relative %: 77 %
Platelets: 357 10*3/uL (ref 150–400)
RBC: 3.76 MIL/uL — ABNORMAL LOW (ref 3.87–5.11)
RDW: 16.2 % — ABNORMAL HIGH (ref 11.5–15.5)
WBC: 11 10*3/uL — ABNORMAL HIGH (ref 4.0–10.5)
nRBC: 0.2 % (ref 0.0–0.2)

## 2019-07-22 LAB — FERRITIN: Ferritin: 96 ng/mL (ref 11–307)

## 2019-07-22 LAB — C-REACTIVE PROTEIN: CRP: 0.7 mg/dL (ref <1.0)

## 2019-07-22 LAB — MAGNESIUM: Magnesium: 2.2 mg/dL (ref 1.7–2.4)

## 2019-07-22 LAB — FIBRIN DERIVATIVES D-DIMER (ARMC ONLY): Fibrin derivatives D-dimer (ARMC): 638.97 ng/mL (FEU) — ABNORMAL HIGH (ref 0.00–499.00)

## 2019-07-22 MED ORDER — SODIUM CHLORIDE 1 G PO TABS
1.0000 g | ORAL_TABLET | Freq: Every day | ORAL | Status: DC
  Administered 2019-07-23: 1 g via ORAL
  Filled 2019-07-22: qty 1

## 2019-07-22 MED ORDER — DILTIAZEM HCL 30 MG PO TABS: 90.0000 mg | ORAL_TABLET | Freq: Four times a day (QID) | ORAL | Status: DC

## 2019-07-22 MED ORDER — METOPROLOL TARTRATE 50 MG PO TABS
50.0000 mg | ORAL_TABLET | Freq: Two times a day (BID) | ORAL | Status: DC
  Administered 2019-07-22 – 2019-07-23 (×3): 50 mg via ORAL
  Filled 2019-07-22 (×3): qty 1

## 2019-07-22 MED ORDER — DILTIAZEM HCL 25 MG/5ML IV SOLN
10.0000 mg | Freq: Once | INTRAVENOUS | Status: AC
  Administered 2019-07-22: 10 mg via INTRAVENOUS
  Filled 2019-07-22: qty 5

## 2019-07-22 MED ORDER — DILTIAZEM HCL ER COATED BEADS 180 MG PO CP24
360.0000 mg | ORAL_CAPSULE | Freq: Every day | ORAL | Status: DC
  Administered 2019-07-23: 360 mg via ORAL
  Filled 2019-07-22: qty 2

## 2019-07-22 NOTE — Progress Notes (Signed)
Dr Dwyane Dee paged to follow up on patient heart rate. Patient a flutter in 135-140 sustained 30 minutes after diltiazem dose. Will CTM and await response from MD. No PRNs available for patient rate.

## 2019-07-22 NOTE — Progress Notes (Signed)
New referral for [facility] community Palliative program to follow at home received from Palliative NP Kathie Rhodes. Patient information given to referral. Thank you for this referral. Flo Shanks BSN, RN, [place] [phone]

## 2019-07-22 NOTE — Progress Notes (Signed)
Dr Dwyane Dee rounded on patient. Per Dr Dwyane Dee, please given 2 hours for diltiazem to be effective. If patient remains high 2 hours after diltiazem dose. Please notify MD.

## 2019-07-22 NOTE — Progress Notes (Signed)
Several hours after diltiazem patients HR has gone down into the 110s and 120s. MD made aware of improvement but that patient still remains high.

## 2019-07-22 NOTE — Progress Notes (Signed)
Daily Progress Note   Patient Name: Cathy Watts       Date: 07/22/2019 DOB: Jun 02, 1945  Age: 74 y.o. MRN#: [id number] Attending Physician: Val Riles, MD Primary Care Physician: Rubye Beach Admit Date: 07/19/2019  Reason for Consultation/Follow-up: Establishing goals of care  Subjective: Sitting up in bed eating breakfast, no complaints, feels like she is breathing well  Length of Stay: 3  Current Medications: Scheduled Meds:  . apixaban  5 mg Oral BID  . aspirin EC  81 mg Oral Daily  . busPIRone  30 mg Oral BID  . cholecalciferol  1,000 Units Oral Daily  . dexamethasone (DECADRON) injection  6 mg Intravenous Q24H  . diltiazem  360 mg Oral Daily  . escitalopram  20 mg Oral Daily  . famotidine  20 mg Oral BID  . furosemide  20 mg Oral Daily  . Ipratropium-Albuterol  1 puff Inhalation Q6H  . magnesium oxide  200 mg Oral BID  . mometasone-formoterol  2 puff Inhalation BID  . pantoprazole  40 mg Oral Daily  . polyethylene glycol  17 g Oral Daily  . pravastatin  20 mg Oral QHS  . rOPINIRole  3 mg Oral QHS  . sodium chloride  1 g Oral BID WC  . sucralfate  1 g Oral TID  . sulfamethoxazole-trimethoprim  1 tablet Oral Q12H    Continuous Infusions: . sodium chloride    . norepinephrine (LEVOPHED) Adult infusion 2 mcg/min (07/20/19 0344)  . remdesivir 100 mg in NS 100 mL 100 mg (07/22/19 0928)    PRN Meds: acetaminophen, albuterol, guaiFENesin-dextromethorphan, LORazepam, magnesium hydroxide, morphine injection, traZODone  Physical Exam Cardiovascular:     Rate and Rhythm: Tachycardia present.     Comments: HR 130s-140s - RN notified Dr. Dwyane Dee Pulmonary:     Effort: Pulmonary effort is normal. No respiratory distress.  Skin:    General: Skin is warm and dry.    Neurological:     Mental Status: She is alert and oriented to person, place, and time.  Psychiatric:        Mood and Affect: Mood normal.        Behavior: Behavior normal.             Vital Signs: BP 131/82 (BP Location: Left Arm)   Pulse (!) 130   Temp 97.9 F (36.6 C) (Oral)   Resp 18   Ht 5\' 10"  (1.778 m)   Wt 116.7 kg   SpO2 93%   BMI 36.92 kg/m  SpO2: SpO2: 93 % O2 Device: O2 Device: Nasal Cannula O2 Flow Rate: O2 Flow Rate (L/min): 2 L/min  Intake/output summary:   Intake/Output Summary (Last 24 hours) at 07/22/2019 1046 Last data filed at 07/22/2019 2010 Gross per 24 hour  Intake 100 ml  Output 3900 ml  Net -3800 ml   LBM: Last BM Date: 07/21/19 Baseline Weight: Weight: 105.2 kg Most recent weight: Weight: 116.7 kg       Palliative Assessment/Data: PPS 50%    Flowsheet Rows     Most Recent Value  Intake Tab  Referral Department  Hospitalist  Unit at Time of Referral  ICU  Palliative Care Primary Diagnosis  Pulmonary  Date Notified  07/19/19  Palliative Care Type  New Palliative care  Reason for referral  Clarify Goals of Care  Date of Admission  07/19/19  Date first seen by Palliative Care  07/20/19  # of days Palliative referral response time  1 Day(s)  # of days IP prior to Palliative referral  0  Clinical Assessment  Palliative Performance Scale Score  50%  Psychosocial & Spiritual Assessment  Palliative Care Outcomes  Patient/Family meeting held?  Yes  Who was at the meeting?  dtr  [place] goals of care, Provided psychosocial or spiritual support      Patient Active Problem List   Diagnosis Date Noted  . Delirium   . Goals of care, counseling/discussion   . Palliative care by specialist   . COPD exacerbation ([place]) 07/19/2019  . COVID-19 07/19/2019  . Acute on chronic respiratory failure with hypoxia ([place])   . Atrial fibrillation with RVR ([place])   . Postobstructive pneumonia 07/09/2019  . Atrial flutter with  rapid ventricular response ([place]) 06/17/2019  . Left leg swelling 05/14/2018  . Elevated hemoglobin A1c 05/02/2018  . Abdominal aortic aneurysm (AAA) 3.0 cm to 5.0 cm in diameter in female ([place]) 06/30/2017  . Abdominal aortic atherosclerosis ([place]) 06/30/2017  . Chronic venous insufficiency 04/11/2016  . Varicose veins of bilateral lower extremities with pain 03/12/2016  . Cancer of lower lobe of right lung ([place]) 10/09/2015  . Oropharyngeal candidiasis 07/12/2015  . Malignant neoplasm of hilus of left lung ([place])   . Personal history of tobacco use, presenting hazards to health 01/16/2015  . Airway hyperreactivity 11/15/2014  . CN (constipation) 11/15/2014  . COPD with acute exacerbation ([place]) 11/15/2014  . Daytime somnolence 11/15/2014  . Clinical depression 11/15/2014  . DD (diverticular disease) 11/15/2014  . Accumulation of fluid in tissues 11/15/2014  . H/O peptic ulcer 11/15/2014  . Osteopenia 11/15/2014  . Awareness of heartbeats 11/15/2014  . Episodic paroxysmal anxiety disorder 11/15/2014  . Apnea, sleep 11/15/2014  . Snores 11/15/2014  . Breath shortness 11/15/2014  . Compulsive tobacco user syndrome 11/15/2014  . Avitaminosis D 11/15/2014  . Barrett esophagus 10/19/2014  . Acid reflux 10/19/2014  . COPD, moderate ([place]) 08/31/2013  . Dyspnea 08/31/2013  . Otitis, externa, infective 08/31/2013  . Nocturnal hypoxemia 08/31/2013  . Hypercholesterolemia without hypertriglyceridemia 11/05/2005    Palliative Care Assessment & Plan   HPI: 74 y.o. female  with past medical history of COPD, HLD, depression, GERD, anxiety, depression, and squamous cell carcinoma of R lung admitted on 07/19/2019 with shortness of breath. Patient found to be COVID-19 positive. Diagnosed with acute on chronic hypoxemic respiratory failure secondary to pna and COVID-19.  This is patient's third admission in past month. PMT consulted for [place].  Assessment: Follow up today with Cathy Watts - she is  feeling much better. No longer delirious. No shortness of breath.  I shared with her the conversation I had with her daughter on Tuesday to verify her goals of care. She confirms everything her daughter said - would want full scope/full code care. Her daughter, [name], is HCPOA. She does share she would never want a trach.  Again states she would never want to go to a facility - she has plenty of family at home to meet her needs at home.  We did discuss the concept of hospice and when that type of care would be appropriate for her. She expresses understanding to this.  She is agreeable to  outpatient palliative seeing her at home.   Recommendations/Plan:  Full code full scope  Home with palliative to see outpatient  Goals of Care and Additional Recommendations:  Limitations on Scope of Treatment: No Tracheostomy  Code Status:  Full code  Prognosis:   Unable to determine  Discharge Planning:  Home with Palliative Services/home health?  Care plan was discussed with patient and Dr. Dwyane Dee  Thank you for allowing the Palliative Medicine Team to assist in the care of this patient.   Total Time 35 minutes Prolonged Time Billed  no       Greater than 50%  of this time was spent counseling and coordinating care related to the above assessment and plan.  Juel Burrow, DNP, [facility] Palliative Medicine Team Team Phone # [phone]  Pager [phone]

## 2019-07-22 NOTE — Telephone Encounter (Signed)
ATC to call daughter back, unable to reach but let her know I would forward the message to Dr. Patsey Berthold

## 2019-07-22 NOTE — Progress Notes (Signed)
[place] at [place] NAME: Cathy Watts    MR#:  [id number]  DATE OF BIRTH:  04-05-1946  SUBJECTIVE:  No overnight issues, patient is improving gradually.  Patient was transferred out of ICU last night.  Patient still has cough and dyspnea on exertion but she felt improvement. Patient remains in A. fib with RVR but does not feel any palpitations or chest pain, no headache or dizziness. Patient denies any abdominal pain no nausea vomiting or diarrhea. Patient is on 2 L oxygen via [place]  REVIEW OF SYSTEMS:   Review of Systems  Constitutional: Negative for chills, fever and weight loss.  HENT: Negative for ear discharge, ear pain and nosebleeds.   Eyes: Negative for blurred vision, pain and discharge.  Respiratory: Positive for cough, shortness of breath and wheezing. Negative for sputum production and stridor.   Cardiovascular: Negative for chest pain, palpitations, orthopnea and PND.  Gastrointestinal: Negative for abdominal pain, diarrhea, nausea and vomiting.  Genitourinary: Negative for frequency and urgency.  Musculoskeletal: Negative for back pain and joint pain.  Neurological: Positive for weakness. Negative for sensory change, speech change and focal weakness.  Psychiatric/Behavioral: Negative for depression and hallucinations. The patient is nervous/anxious.    Tolerating Diet:yes Tolerating PT: pending  DRUG ALLERGIES:   Allergies  Allergen Reactions  . Amoxicillin Hives and Itching    Did it involve swelling of the face/tongue/throat, SOB, or low BP? No Did it involve sudden or severe rash/hives, skin peeling, or any reaction on the inside of your mouth or nose? No Did you need to seek medical attention at a hospital or doctor's office? No When did it last happen?2018 If all above answers are "NO", may proceed with cephalosporin use.     . Chlorhexidine Dermatitis    Patient refused    VITALS:  Blood pressure  133/75, pulse (!) 124, temperature 98 F (36.7 C), temperature source Oral, resp. rate 18, height 5\' 10"  (1.778 m), weight 116.7 kg, SpO2 91 %.  PHYSICAL EXAMINATION:   Physical Exam  GENERAL:  74 y.o.-year-old patient lying in the bed with mild -moderate acute respiratory distress. Appears chronically ill EYES: Pupils equal, round, reactive to light and accommodation. No scleral icterus.   HEENT: Head atraumatic, normocephalic. Oropharynx and nasopharynx clear.  NECK:  Supple, no jugular venous distention. No thyroid enlargement, no tenderness.  LUNGS: Coarse breath sounds bilaterally, no wheezing, rales, ++ rhonchi. No use of accessory muscles of respiration.  CARDIOVASCULAR: S1, S2 normal. No murmurs, rubs, or gallops. Tachycardia++ ABDOMEN: Soft, nontender, nondistended. Bowel sounds present. No organomegaly or mass.  EXTREMITIES: No cyanosis, clubbing or edema b/l.    NEUROLOGIC: Cranial nerves II through XII are intact. No focal Motor or sensory deficits b/l.   PSYCHIATRIC:  patient is alert and oriented x 3.  SKIN: No obvious rash, lesion, or ulcer.   LABORATORY PANEL:  CBC Recent Labs  Lab 07/22/19 0451  WBC 11.0*  HGB 10.7*  HCT 32.9*  PLT 357    Chemistries  Recent Labs  Lab 07/22/19 0451  NA 139  K 4.1  CL 99  CO2 36*  GLUCOSE 142*  BUN 15  CREATININE 0.65  CALCIUM 8.9  MG 2.2  AST 19  ALT 32  ALKPHOS 58  BILITOT 0.4   Cardiac Enzymes No results for input(s): TROPONINI in the last 168 hours. RADIOLOGY:  No results found. ASSESSMENT AND PLAN:  Cathy Watts  is a 74 y.o. female with  a known history of COPD, dyslipidemia, depression, GERD, and anxiety and depression and squamous cell carcinoma of the right lung, presented to the emergency room with acute onset of worsening dyspnea with associated cough productive of yellowish sputum and wheezing over the last 10 days. chest x-ray showed slightly increased opacities in the right lung base that may  indicate developing consolidation   1. Acute on chronic  hypoxemic respiratory failure secondary to COVID-19. -O2 protocol will be followed to keep O2 saturation above 93. -Pt was on HFNC 35 liter/min after anxiety spell-->now down to 2L/min [place] (her baseline) -s/p ICU stay, transferred out 07/21/2019   2. Pneumonia secondary to COVID-19. -IV remdisivir 3/15=>3/19  -Po bactrim--empiric -IV decadron - on scheduled Mucinex and as needed Tussionex. -O2 protocol will be followed. - will follow CRP 2.8, ferritin- 103 - on vitamin D3, vitamin C, zinc sulfate, p.o. Pepcid and aspirin.  3. Atrial flutter with rapid ventricular response with chronic Systolic dysfunction -EF 00-92% with septal and apical hyokinesis -on IV Cardizem drip --weaned off --change to po metoprolol succinate and cont Cardizem CD 360 mg qd - continue Eliquis. -seen by Cardiology PA and Dr fath--rec out pt ischemia w/u  4.  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 --Started fluid restriction  5.  Depression and anxiety. -Lexapro and BuSpar   6.  Restless leg syndrome. -on Requip .  7.  GERD. -Carafate and PPI therapy   8.  Dyslipidemia. - on pravchol  9. H/o Squamous cell lung cancer stage 1A -- follows with Dr Rogue Bussing -s/p Chemo and radiation -h/o smoking in the past -pt will need to be reschedule at as out pt with Dr Mortimer Fries to get Bronch stent placement. She will need to be off Eliquis also  10.  DVT prophylaxis. - continue Eliquis.  Palliative care see patient given multiple comorbidities and repeated admission for similar issues. Patient and family will benefit from discussion of goals of care  Procedures: None Family communication : Previous attending spoke with [name]--dter on the phone Consults :ICU MD and Cardiology Discharge Disposition : most likely to home after completing Remdesivir, last dose 07/23/2019,  PT pending.  Today patient remains tachycardic,  we will continue to watch, possible discharge tomorrow.  CODE STATUS: FULL DVT Prophylaxis :eliquis   TOTAL TIME TAKING CARE OF THIS PATIENT: *25* minutes.  >50% time spent on counselling and coordination of care  Note: This dictation was prepared with Dragon dictation along with smaller phrase technology. Any transcriptional errors that result from this process are unintentional.  Val Riles M.D    [facility]   CC: Primary care physician; Mar Daring, PA-CPatient ID: Cathy Watts, female   DOB: 22-Oct-1945, 74 y.o.   MRN: [id number]

## 2019-07-22 NOTE — TOC Progression Note (Signed)
Transition of Care [facility]) - Progression Note    Patient Details  Name: Cathy Watts MRN: [id number] Date of Birth: 1945/11/01  Transition of Care [facility]) CM/SW Contact  Shelbie Hutching, RN Phone Number: 07/22/2019, 12:28 PM  Clinical Narrative:    Patient would like to be followed by outpatient palliative, Flo Shanks with [facility] has the referral.    Expected Discharge Plan: [place] Barriers to Discharge: Continued Medical Work up  Expected Discharge Plan and Services Expected Discharge Plan: [place] In-house Referral: Hospice / [place] arrangements for the past 2 months: Single Family Home                                       Social Determinants of Health (SDOH) Interventions    Readmission Risk Interventions Readmission Risk Prevention Plan 07/21/2019  Transportation Screening Complete  Medication Review ([place]) Complete  PCP or Specialist appointment within 3-5 days of discharge Complete  HRI or [place] Complete  Some recent data might be hidden

## 2019-07-23 ENCOUNTER — Telehealth: Payer: Self-pay | Admitting: Pulmonary Disease

## 2019-07-23 ENCOUNTER — Encounter: Admission: RE | Payer: Self-pay | Source: Home / Self Care

## 2019-07-23 ENCOUNTER — Telehealth: Payer: Self-pay

## 2019-07-23 ENCOUNTER — Ambulatory Visit: Admission: RE | Admit: 2019-07-23 | Payer: Medicare Other | Source: Home / Self Care | Admitting: Pulmonary Disease

## 2019-07-23 LAB — CBC WITH DIFFERENTIAL/PLATELET
Abs Immature Granulocytes: 0.63 10*3/uL — ABNORMAL HIGH (ref 0.00–0.07)
Basophils Absolute: 0.1 10*3/uL (ref 0.0–0.1)
Basophils Relative: 1 %
Eosinophils Absolute: 0 10*3/uL (ref 0.0–0.5)
Eosinophils Relative: 0 %
HCT: 36 % (ref 36.0–46.0)
Hemoglobin: 11.3 g/dL — ABNORMAL LOW (ref 12.0–15.0)
Immature Granulocytes: 5 %
Lymphocytes Relative: 9 %
Lymphs Abs: 1.2 10*3/uL (ref 0.7–4.0)
MCH: 27.9 pg (ref 26.0–34.0)
MCHC: 31.4 g/dL (ref 30.0–36.0)
MCV: 88.9 fL (ref 80.0–100.0)
Monocytes Absolute: 1.6 10*3/uL — ABNORMAL HIGH (ref 0.1–1.0)
Monocytes Relative: 13 %
Neutro Abs: 9.5 10*3/uL — ABNORMAL HIGH (ref 1.7–7.7)
Neutrophils Relative %: 72 %
Platelets: 374 10*3/uL (ref 150–400)
RBC: 4.05 MIL/uL (ref 3.87–5.11)
RDW: 16.3 % — ABNORMAL HIGH (ref 11.5–15.5)
WBC: 13 10*3/uL — ABNORMAL HIGH (ref 4.0–10.5)
nRBC: 0.2 % (ref 0.0–0.2)

## 2019-07-23 LAB — COMPREHENSIVE METABOLIC PANEL
ALT: 35 U/L (ref 0–44)
AST: 22 U/L (ref 15–41)
Albumin: 3.3 g/dL — ABNORMAL LOW (ref 3.5–5.0)
Alkaline Phosphatase: 59 U/L (ref 38–126)
Anion gap: 9 (ref 5–15)
BUN: 21 mg/dL (ref 8–23)
CO2: 31 mmol/L (ref 22–32)
Calcium: 8.8 mg/dL — ABNORMAL LOW (ref 8.9–10.3)
Chloride: 98 mmol/L (ref 98–111)
Creatinine, Ser: 0.75 mg/dL (ref 0.44–1.00)
GFR calc Af Amer: >60 mL/min (ref >60)
GFR calc non Af Amer: >60 mL/min (ref >60)
Glucose, Bld: 117 mg/dL — ABNORMAL HIGH (ref 70–99)
Potassium: 4.3 mmol/L (ref 3.5–5.1)
Sodium: 138 mmol/L (ref 135–145)
Total Bilirubin: 0.6 mg/dL (ref 0.3–1.2)
Total Protein: 6 g/dL — ABNORMAL LOW (ref 6.5–8.1)

## 2019-07-23 LAB — C-REACTIVE PROTEIN: CRP: 0.6 mg/dL (ref <1.0)

## 2019-07-23 LAB — FERRITIN: Ferritin: 100 ng/mL (ref 11–307)

## 2019-07-23 LAB — MAGNESIUM: Magnesium: 2.3 mg/dL (ref 1.7–2.4)

## 2019-07-23 LAB — FIBRIN DERIVATIVES D-DIMER (ARMC ONLY): Fibrin derivatives D-dimer (ARMC): 512.81 ng/mL (FEU) — ABNORMAL HIGH (ref 0.00–499.00)

## 2019-07-23 SURGERY — BRONCHOSCOPY, WITH FLUOROSCOPY
Anesthesia: General

## 2019-07-23 MED ORDER — ACETAMINOPHEN 325 MG PO TABS: 650.0000 mg | ORAL_TABLET | Freq: Four times a day (QID) | ORAL | Status: AC | PRN

## 2019-07-23 MED ORDER — ASCORBIC ACID 500 MG PO TABS: 500.0000 mg | ORAL_TABLET | Freq: Every day | ORAL | 0 refills | Status: DC

## 2019-07-23 MED ORDER — DEXAMETHASONE 6 MG PO TABS: 6.0000 mg | ORAL_TABLET | Freq: Every day | ORAL | 0 refills | Status: AC

## 2019-07-23 NOTE — TOC Progression Note (Signed)
Transition of Care [facility]) - Progression Note    Patient Details  Name: Cathy Watts MRN: [id number] Date of Birth: 02-Jan-1946  Transition of Care [facility]) CM/SW Contact  Su Hilt, RN Phone Number: 07/23/2019, 11:50 AM  Clinical Narrative:     Patient to DC home today with her daughter, she has DME at home and does not need additional, She is set up with [facility] for [facility] services, I notified Corene Cornea with [facility] that the patient is discharging  Expected Discharge Plan: [place] Barriers to Discharge: Continued Medical Work up  Expected Discharge Plan and Services Expected Discharge Plan: [place] In-house Referral: Hospice / [place] arrangements for the past 2 months: Single Family Home Expected Discharge Date: 07/23/19                                     Social Determinants of Health (SDOH) Interventions    Readmission Risk Interventions Readmission Risk Prevention Plan 07/21/2019  Transportation Screening Complete  Medication Review ([place]) Complete  PCP or Specialist appointment within 3-5 days of discharge Complete  HRI or Home Care Consult Complete  Skilled Nursing Facility Complete  Some recent data might be hidden

## 2019-07-23 NOTE — Progress Notes (Signed)
Reviewed AVS with patient and discharge instructions. Waiting on family to bring change of clothes and home 02.

## 2019-07-23 NOTE — Telephone Encounter (Signed)
This was done already.  We will reschedule for after first part of April.

## 2019-07-23 NOTE — Telephone Encounter (Signed)
Per [name] patient is suppose to have her second covid vaccine but she tested positive for covid and they want to know how long do they need to wait to get?

## 2019-07-23 NOTE — Telephone Encounter (Signed)
Can we call to see if she has any specific questions. I can call back at the end of clinic today.

## 2019-07-23 NOTE — Telephone Encounter (Signed)
Copied from [place] [phone]. Topic: General - Other >> Jul 23, 2019  1:46 PM Celene Kras wrote: Reason for CRM: Pts daughter called stating that she has lots of questions regarding pt testing positive for covid. She is requesting information regarding pts covid vaccine and reschedule pts procedure. Pts daughter states that pt is not able to be dependent and has about  3 people working with her at all times. Pts daughter is requesting to have someone call her back to speak with someone about this. Please advise.

## 2019-07-23 NOTE — Telephone Encounter (Signed)
Dr. Patsey Berthold was aware of the patient status as of clinic this morning and patient will be re-scheduled accordingly. I also spoke with Dr. Patsey Berthold in regards to receiving this message.

## 2019-07-23 NOTE — Discharge Summary (Signed)
[facility] Discharge Summary   Patient: Cathy Watts QIO:[id number]  PCP: Mar Daring, PA-C  Date of admission: 07/19/2019   Date of discharge:  07/23/2019     Discharge Diagnoses:  Principal diagnosis COVID-19 viral pneumonia  Active Problems:   COPD exacerbation ([place])   COVID-19   Acute on chronic respiratory failure with hypoxia ([place])   Atrial fibrillation with RVR ([place])   Goals of care, counseling/discussion   Palliative care by specialist   Delirium   Admitted From: Home Disposition:  Home with [facility] PT  Recommendations for Outpatient Follow-up:  1. PCP: In 1 week, follow with oncologist as per schedule and follow with pulmonologist, cardiologist as per schedule 2. Follow up LABS/TEST: Chest x-ray after 4 weeks  Follow-up Information    Mar Daring, PA-C Follow up.   Specialty: Family Medicine Why: Please schedule PCP follow up appointment for within 3-5 days of discharge date. Contact information: [address]  [place] [address] [phone]          Diet recommendation: Cardiac diet  Activity: The patient is advised to gradually reintroduce usual activities, as tolerated  Discharge Condition: stable  Code Status: Full code   History of present illness: As per the H and P dictated on admission SandraHighfillis a74 y.o.femalewith a known history of COPD, dyslipidemia, depression, GERD, and anxiety and depression and squamous cell carcinoma of the right lung, presented to the emergency room with acute onset of worsening dyspnea with associated cough productive of yellowish sputum and wheezing over the last 10 days. chest x-ray showed slightly increased opacities in the right lung base that may indicate developing consolidation    Hospital Course:  Summary of her active problems in the hospital is as following.  # Acute on chronic  hypoxemic respiratory failure secondary to COVID-19. -O2 protocol will be  followed to keep O2 saturation above 93. -Pt was on HFNC 35 liter/min after anxiety spell-->now down to 2L/min [place] (her baseline) -s/p ICU stay, transferred out 07/21/2019, status stable without any distress. #Pneumoniasecondary to COVID-19, s/p IV remdisivir 3/15=>3/19 and Po bactrim--empiric -IV decadron, switched to oral Decadron 6 mg p.o. daily for 5 additional days.  s/p on vitamin D3, vitamin C, zinc sulfate, p.o. Pepcid and aspirin given during hospital stay. Continue DuoNeb and inhalers at home.  Continue vitamin C for 2 weeks.  Repeat chest x-ray after 4 weeks for resolution of pneumonia and follow with PCP. # Atrial flutter with rapid ventricular response with chronic Systolic dysfunction -EF 40-34% with septal and apical hyokinesis. S/p IV Cardizem drip --weaned off --Continue metoprolol succinate and cont Cardizem CD 360 mg qd, and Eliquis. 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  Resolved, sodium 138 today. # Depression and anxiety-Lexapro and BuSpar  # Restless leg syndrome, -on Requip . # GERD, -Carafate and PPI therapy  # Dyslipidemia, - on pravchol # H/o Squamous cell lung cancer stage 1A -- follows with Dr Rogue Bussing -s/p Chemo and radiation, -h/o smoking in the past -pt will need to be reschedule at as out pt with Dr Mortimer Fries to get Bronch stent placement. She will need to be off Eliquis also # Palliative care see patient given multiple comorbidities and repeated admission for similar issues. Patient and family wound benefit from discussion of goals of care.  Palliative care will continue to follow as an  outpatient.  Body mass index is 36.92 kg/m.  Nutrition Interventions:      No controlled substance prescribed for pain control  - [facility] Controlled Substance Reporting System database was not reviewed. - Patient was  instructed, not to drive, operate heavy machinery, perform activities at heights, swimming or participation in water activities or provide baby sitting services while on Pain, Sleep and Anxiety Medications; until her outpatient Physician has advised to do so again.  - Also recommended to not to take more than prescribed Pain, Sleep and Anxiety Medications.  Patient was seen by physical therapy, who recommended Home health, which was arranged. On the day of the discharge the patient's vitals were stable, and no other acute medical condition were reported by patient. the patient was felt safe to be discharge at Home with Home health.  Consultants: [facility] critical care, oncologist, psychiatrist, PT/OT, TOC Procedures: None  Discharge Exam: General: Appear in mild distress, no Rash; Oral Mucosa Clear, moist. Cardiovascular: S1 and S2 Present, no Murmur, Respiratory: normal respiratory effort, Bilateral Air entry present and mild Crackles, mild wheezes Abdomen: Bowel Sound present, Soft and no tenderness, no hernia Extremities: no Pedal edema, no calf tenderness Neurology: alert and oriented to time, place, and person affect appropriate.  Filed Weights   07/19/19 0123 07/19/19 0825 07/20/19 0500  Weight: 105.2 kg 117.7 kg 116.7 kg   Vitals:   07/23/19 0732 07/23/19 0900  BP: 139/85   Pulse: 62 67  Resp: 19 13  Temp: 98.6 F (37 C)   SpO2: 93% 94%    DISCHARGE MEDICATION: Allergies as of 07/23/2019      Reactions   Amoxicillin Hives, Itching   Did it involve swelling of the face/tongue/throat, SOB, or low BP? No Did it involve sudden or severe rash/hives, skin peeling, or any reaction on the inside of your mouth or nose? No Did you need to seek medical attention at a hospital or doctor's office? No When did it last happen?2018 If all above answers are "NO", may proceed with cephalosporin use.   Chlorhexidine Dermatitis   Patient refused      Medication List    STOP  taking these medications   levofloxacin 750 MG tablet Commonly known as: LEVAQUIN     TAKE these medications   acetaminophen 325 MG tablet Commonly known as: TYLENOL Take 2 tablets (650 mg total) by mouth every 6 (six) hours as needed for mild pain or headache (fever >/= 101).   apixaban 5 MG Tabs tablet Commonly known as: ELIQUIS Take 1 tablet (5 mg total) by mouth 2 (two) times daily.   ascorbic acid 500 MG tablet Commonly known as: VITAMIN C Take 1 tablet (500 mg total) by mouth daily for 14 days.   busPIRone 30 MG tablet Commonly known as: BUSPAR Take 1 tablet by mouth twice daily   dexamethasone 6 MG tablet Commonly known as: DECADRON Take 1 tablet (6 mg total) by mouth daily for 5 days.   diltiazem 360 MG 24 hr capsule Commonly known as: CARDIZEM CD Take 1 capsule (360 mg total) by mouth daily.   escitalopram 20 MG tablet Commonly known as: LEXAPRO Take 1 tablet by mouth once daily   Flutter Devi 1 each by Does not apply route QID.   furosemide 20 MG tablet Commonly known as: LASIX Take 1 tablet (20 mg total) by mouth daily.   guaiFENesin-dextromethorphan 100-10 MG/5ML syrup Commonly known as: ROBITUSSIN DM Take 5 mLs by mouth every 4 (four) hours as  needed for cough.   ipratropium 17 MCG/ACT inhaler Commonly known as: ATROVENT HFA Inhale 2 puffs into the lungs 4 (four) times daily.   ipratropium-albuterol 0.5-2.5 (3) MG/3ML Soln Commonly known as: DUONEB Take 3 mLs by nebulization every 6 (six) hours as needed. What changed: reasons to take this   Magnesium 250 MG Tabs Take 250 mg by mouth 2 (two) times daily.   metoprolol tartrate 50 MG tablet Commonly known as: LOPRESSOR Take 50 mg by mouth 2 (two) times daily.   nystatin 100000 UNIT/ML suspension Commonly known as: MYCOSTATIN Take 5 mLs (500,000 Units total) by mouth 4 (four) times daily.   omeprazole 40 MG capsule Commonly known as: PRILOSEC Take 1 capsule by mouth once daily     polyethylene glycol powder 17 GM/SCOOP powder Commonly known as: GLYCOLAX/MIRALAX TAKE 17 GRAMS OF POWDER MIXED IN 8 OUNCES OF WATER BY MOUTH ONCE A DAY.   potassium chloride SA 20 MEQ tablet Commonly known as: KLOR-CON Take 1 tablet (20 mEq total) by mouth daily.   pravastatin 20 MG tablet Commonly known as: PRAVACHOL TAKE 1 TABLET BY MOUTH AT BEDTIME What changed: when to take this   ProAir HFA 108 (90 Base) MCG/ACT inhaler Generic drug: albuterol INHALE 2 PUFFS BY MOUTH EVERY 4 HOURS AS NEEDED FOR WHEEZING FOR SHORTNESS OF BREATH What changed: See the new instructions.   rOPINIRole 3 MG tablet Commonly known as: REQUIP TAKE 1 TABLET BY MOUTH AT BEDTIME   sucralfate 1 g tablet Commonly known as: CARAFATE Take 1 g by mouth 3 (three) times daily. DISSOLVE IN THREE TO FOUR TABLESPOONFULS OF WARM WARM SWISH AND SWALLOW   Trelegy Ellipta 100-62.5-25 MCG/INH Aepb Generic drug: Fluticasone-Umeclidin-Vilant Inhale 1 puff into the lungs daily.            Durable Medical Equipment  (From admission, onward)         Start     Ordered   07/23/19 0726  For home use only DME oxygen  Once    Question Answer Comment  Length of Need 6 Months   Mode or (Route) Nasal cannula   Oxygen delivery system Gas      07/23/19 0725         Allergies  Allergen Reactions  . Amoxicillin Hives and Itching    Did it involve swelling of the face/tongue/throat, SOB, or low BP? No Did it involve sudden or severe rash/hives, skin peeling, or any reaction on the inside of your mouth or nose? No Did you need to seek medical attention at a hospital or doctor's office? No When did it last happen?2018 If all above answers are "NO", may proceed with cephalosporin use.     . Chlorhexidine Dermatitis    Patient refused   Discharge Instructions    Call MD for:  difficulty breathing, headache or visual disturbances   Complete by: As directed    Call MD for:  temperature >100.4    Complete by: As directed    Diet - low sodium heart healthy   Complete by: As directed    Discharge instructions   Complete by: As directed    Follow-up with PCP in 1 week, continue home PT and RN visit will be arranged.  Repeat chest x-ray after 4 weeks for resolution of pneumonia Palliative care will continue to follow as an outpatient. Patient is to be on quarantine at home as per [facility] guideline Follow oncologist as per schedule.   Increase activity slowly   Complete  by: As directed       The results of significant diagnostics from this hospitalization (including imaging, microbiology, ancillary and laboratory) are listed below for reference.    Significant Diagnostic Studies: DG Chest 2 View  Result Date: 07/09/2019 CLINICAL DATA:  Shortness of breath EXAM: CHEST - 2 VIEW COMPARISON:  06/28/2019 FINDINGS: Perihilar airspace opacities are again noted. These have not substantially changed since the prior study. No pneumothorax. No large pleural effusion. The Port-A-Cath is stable in positioning. Heart size is stable. Aortic calcifications are noted. IMPRESSION: 1. No acute cardiopulmonary process. 2. Stable appearance of the lungs as detailed above. Electronically Signed   By: Constance Holster M.D.   On: 07/09/2019 19:50   DG Chest 2 View  Result Date: 06/28/2019 CLINICAL DATA:  Chest pain, lung cancer EXAM: CHEST - 2 VIEW COMPARISON:  06/17/2019 FINDINGS: Right chest wall port catheter is unchanged. Bilateral perihilar opacities are unchanged. No pleural effusion or pneumothorax. Heart size is stable. Cholecystectomy clips. IMPRESSION: No acute finding. Stable appearance of bilateral perihilar opacities favored to reflect sequelae of radiation. Electronically Signed   By: Macy Mis M.D.   On: 06/28/2019 15:59   CT Angio Chest PE W and/or Wo Contrast  Result Date: 07/09/2019 CLINICAL DATA:  Shortness of breath x2 months. EXAM: CT ANGIOGRAPHY CHEST WITH CONTRAST TECHNIQUE:  Multidetector CT imaging of the chest was performed using the standard protocol during bolus administration of intravenous contrast. Multiplanar CT image reconstructions and MIPs were obtained to evaluate the vascular anatomy. CONTRAST:  38mL OMNIPAQUE IOHEXOL 350 MG/ML SOLN COMPARISON:  April 12, 2019 FINDINGS: Cardiovascular: Contrast injection is sufficient to demonstrate satisfactory opacification of the pulmonary arteries to the segmental level. There is no pulmonary embolus. The main pulmonary artery is within normal limits for size. There is no CT evidence of acute right heart strain. There are atherosclerotic changes of the visualized thoracic aorta without evidence for an aneurysm. Heart size is normal. Coronary artery calcifications are noted. Mediastinum/Nodes: --there is new mediastinal adenopathy. For example there is a pretracheal lymph node measuring approximately 1 cm (axial series 4, image 34). There is a precarinal lymph node measuring approximately 1.4 cm in the short axis (previously measuring less than 4 mm). Hilar adenopathy is noted. --No axillary lymphadenopathy. --there are few mildly prominent left supraclavicular lymph nodes measuring up to approximately 0.8 cm (axial series 4, image 7). These are new since prior CT. --Normal thyroid gland. --The esophagus is unremarkable Lungs/Pleura: There is a worsening ill-defined left hilar mass causing compression of the left mainstem bronchus as well as the left upper and left lower lobe bronchi. There is worsening postobstructive atelectasis in the superior left lower lobe and left upper lobe. The right perihilar region is essentially stable in appearance from prior study. There are small bilateral pleural effusions, right greater than left. Moderate emphysematous changes are noted bilaterally. There are branching airspace opacities in the left upper lobe concerning for pneumonia. There are few sub solid airspace opacities in the medial right  lower lobe which may represent postobstructive atelectasis or pneumonia. Upper Abdomen: No acute abnormality. Musculoskeletal: No chest wall abnormality. No acute or significant osseous findings. IMPRESSION: 1. No acute pulmonary embolism. 2. Postobstructive pneumonia in the left upper lobe. 3. Worsening postobstructive findings in the left upper and left lower lobes with worsening ill-defined soft tissue in the left hilum is concerning for disease progression. Pulmonary medicine follow-up is recommended. 4. Worsening mediastinal, hilar, and left supraclavicular adenopathy. While this may be  reactive, nodal metastatic disease is not excluded. 5. Trace to small bilateral pleural effusions. Aortic Atherosclerosis (ICD10-I70.0) and Emphysema (ICD10-J43.9). Electronically Signed   By: Constance Holster M.D.   On: 07/09/2019 22:09   MR BRAIN W WO CONTRAST  Result Date: 07/14/2019 CLINICAL DATA:  74 year old female with right lung cancer, non-small cell. Suspicion of disease progression in the chest recently. Staging. EXAM: MRI HEAD WITHOUT AND WITH CONTRAST TECHNIQUE: Multiplanar, multiecho pulse sequences of the brain and surrounding structures were obtained without and with intravenous contrast. CONTRAST:  25mL GADAVIST GADOBUTROL 1 MMOL/ML IV SOLN COMPARISON:  Noncontrast head CTs 06/27/2017 and earlier. FINDINGS: Brain: No restricted diffusion to suggest acute infarction. No midline shift, mass effect, evidence of mass lesion, ventriculomegaly, extra-axial collection or acute intracranial hemorrhage. Cervicomedullary junction and pituitary are within normal limits. No abnormal enhancement identified. There is motion artifact on the postcontrast images. No dural thickening. Scattered nonspecific cerebral white matter T2 and FLAIR hyperintensity, mild to moderate for age. None resembles vasogenic edema. Similar patchy T2 hyperintensity in the pons. No cortical encephalomalacia or chronic cerebral blood products  identified. The deep gray nuclei and cerebellum appear negative. Vascular: Major intracranial vascular flow voids are preserved. Skull and upper cervical spine: Visualized bone marrow signal is within normal limits. Sinuses/Orbits: Postoperative changes to both globes, otherwise negative orbits. Paranasal sinuses are clear. Other: Mild bilateral mastoid effusions. Visible internal auditory structures appear normal. Scalp and face soft tissues appear negative. IMPRESSION: 1. No metastatic disease or acute intracranial abnormality identified. 2. Mild to moderate for age signal changes in the cerebral white matter and pons, most commonly due to chronic small vessel disease. Electronically Signed   By: Genevie Ann M.D.   On: 07/14/2019 23:51   DG Chest Portable 1 View  Result Date: 07/19/2019 CLINICAL DATA:  Shortness of breath. Lung cancer and COPD. EXAM: PORTABLE CHEST 1 VIEW COMPARISON:  07/09/2019 FINDINGS: Unchanged position of right chest wall Port-A-Cath. Bilateral parahilar opacities are unchanged. Opacities in the right lung base are slightly increased. No pleural effusion or pneumothorax. IMPRESSION: Slightly increased opacities in the right lung base may indicate developing consolidation. Otherwise unchanged radiograph. Electronically Signed   By: Ulyses Jarred M.D.   On: 07/19/2019 02:02    Microbiology: Recent Results (from the past 240 hour(s))  Culture, blood (routine x 2)     Status: None (Preliminary result)   Collection Time: 07/19/19  1:32 AM   Specimen: BLOOD  Result Value Ref Range Status   Specimen Description BLOOD RIGHT ANTECUBITAL  Final   Special Requests   Final    BOTTLES DRAWN AEROBIC AND ANAEROBIC Blood Culture adequate volume   Culture   Final    NO GROWTH 4 DAYS Performed at [facility], [address], [place], [place] [address]    Report Status PENDING  Incomplete  Culture, blood (routine x 2)     Status: None (Preliminary result)   Collection Time: 07/19/19   1:56 AM   Specimen: BLOOD  Result Value Ref Range Status   Specimen Description BLOOD RIGHT ANTECUBITAL  Final   Special Requests   Final    BOTTLES DRAWN AEROBIC AND ANAEROBIC Blood Culture adequate volume   Culture   Final    NO GROWTH 4 DAYS Performed at [facility], [address] [place],  [address]    Report Status PENDING  Incomplete  Respiratory Panel by RT PCR (Flu A&B, Covid) - Nasopharyngeal Swab     Status: Abnormal  Collection Time: 07/19/19  1:56 AM   Specimen: Nasopharyngeal Swab  Result Value Ref Range Status   SARS Coronavirus 2 by RT PCR POSITIVE (A) NEGATIVE Final    Comment: RESULT CALLED TO, READ BACK BY AND VERIFIED WITH: APRIL BRUMGARD 07/19/19 AT 0404 HS    Influenza A by PCR NEGATIVE NEGATIVE Final   Influenza B by PCR NEGATIVE NEGATIVE Final    Comment: (NOTE) The Xpert Xpress SARS-CoV-2/FLU/RSV assay is intended as an aid in  the diagnosis of influenza from Nasopharyngeal swab specimens and  should not be used as a sole basis for treatment. Nasal washings and  aspirates are unacceptable for Xpert Xpress SARS-CoV-2/FLU/RSV  testing. Fact Sheet for Patients: PinkCheek.be Fact Sheet for Healthcare Providers: GravelBags.it This test is not yet approved or cleared by the [place] FDA and  has been authorized for detection and/or diagnosis of SARS-CoV-2 by  FDA under an Emergency Use Authorization (EUA). This EUA will remain  in effect (meaning this test can be used) for the duration of the  Covid-19 declaration under Section 564(b)(1) of the Act, 21  U.S.C. section 360bbb-3(b)(1), unless the authorization is  terminated or revoked. Performed at [facility], [address] [place], [place] [address]   MRSA PCR Screening     Status: None   Collection Time: 07/19/19 12:51 PM   Specimen: Nasopharyngeal  Result Value Ref Range Status   MRSA by PCR NEGATIVE  NEGATIVE Final    Comment:        The GeneXpert MRSA Assay (FDA approved for NASAL specimens only), is one component of a comprehensive MRSA colonization surveillance program. It is not intended to diagnose MRSA infection nor to guide or monitor treatment for MRSA infections. Performed at [facility], [address] [place], [place] [address]      Labs: CBC: Recent Labs  Lab 07/19/19 0125 07/20/19 0422 07/21/19 0742 07/22/19 0451 07/23/19 0546  WBC 12.5* 10.2 11.7* 11.0* 13.0*  NEUTROABS 8.4* 8.1* 9.2* 8.5* 9.5*  HGB 11.7* 10.5* 10.6* 10.7* 11.3*  HCT 37.3 32.0* 33.6* 32.9* 36.0  MCV 88.0 86.7 88.9 87.5 88.9  PLT 433* 339 347 357 026   Basic Metabolic Panel: Recent Labs  Lab 07/19/19 0125 07/20/19 0422 07/21/19 0742 07/22/19 0451 07/23/19 0546  NA 126* 130* 136 139 138  K 4.4 4.6 4.7 4.1 4.3  CL 88* 90* 96* 99 98  CO2 25 29 29  36* 31  GLUCOSE 138* 153* 149* 142* 117*  BUN 20 17 18 15 21   CREATININE 0.73 0.59 0.72 0.65 0.75  CALCIUM 8.8* 9.0 8.8* 8.9 8.8*  MG  --  2.2 2.2 2.2 2.3   Liver Function Tests: Recent Labs  Lab 07/20/19 0422 07/21/19 0742 07/22/19 0451 07/23/19 0546  AST 29 22 19 22   ALT 35 38 32 35  ALKPHOS 72 64 58 59  BILITOT 0.6 0.4 0.4 0.6  PROT 6.2* 6.0* 5.9* 6.0*  ALBUMIN 3.1* 3.1* 3.1* 3.3*   No results for input(s): LIPASE, AMYLASE in the last 168 hours. No results for input(s): AMMONIA in the last 168 hours. Cardiac Enzymes: No results for input(s): CKTOTAL, CKMB, CKMBINDEX, TROPONINI in the last 168 hours. BNP (last 3 results) Recent Labs    07/09/19 2019 07/19/19 0125  BNP 168.0* 155.0*   CBG: Recent Labs  Lab 07/19/19 0913  GLUCAP 166*    Time spent: 35 minutes  Signed:  Val Riles  [facility]  07/23/2019 10:37 AM

## 2019-07-23 NOTE — Progress Notes (Signed)
Due to patient COVID positive status patient will not receive the pneumoccocal or the flu vaccine, per protocol.

## 2019-07-23 NOTE — Telephone Encounter (Signed)
Called and left VM for [employer]

## 2019-07-24 ENCOUNTER — Other Ambulatory Visit: Payer: Self-pay | Admitting: Physician Assistant

## 2019-07-24 DIAGNOSIS — C7B8 Other secondary neuroendocrine tumors: Secondary | ICD-10-CM | POA: Diagnosis not present

## 2019-07-24 DIAGNOSIS — I714 Abdominal aortic aneurysm, without rupture: Secondary | ICD-10-CM | POA: Diagnosis not present

## 2019-07-24 DIAGNOSIS — I10 Essential (primary) hypertension: Secondary | ICD-10-CM | POA: Diagnosis not present

## 2019-07-24 DIAGNOSIS — C3431 Malignant neoplasm of lower lobe, right bronchus or lung: Secondary | ICD-10-CM | POA: Diagnosis not present

## 2019-07-24 DIAGNOSIS — D649 Anemia, unspecified: Secondary | ICD-10-CM | POA: Diagnosis not present

## 2019-07-24 DIAGNOSIS — M8580 Other specified disorders of bone density and structure, unspecified site: Secondary | ICD-10-CM | POA: Diagnosis not present

## 2019-07-24 DIAGNOSIS — G2581 Restless legs syndrome: Secondary | ICD-10-CM | POA: Diagnosis not present

## 2019-07-24 DIAGNOSIS — K219 Gastro-esophageal reflux disease without esophagitis: Secondary | ICD-10-CM | POA: Diagnosis not present

## 2019-07-24 DIAGNOSIS — J439 Emphysema, unspecified: Secondary | ICD-10-CM | POA: Diagnosis not present

## 2019-07-24 DIAGNOSIS — K2270 Barrett's esophagus without dysplasia: Secondary | ICD-10-CM | POA: Diagnosis not present

## 2019-07-24 DIAGNOSIS — J9621 Acute and chronic respiratory failure with hypoxia: Secondary | ICD-10-CM | POA: Diagnosis not present

## 2019-07-24 DIAGNOSIS — G4733 Obstructive sleep apnea (adult) (pediatric): Secondary | ICD-10-CM | POA: Diagnosis not present

## 2019-07-24 LAB — CULTURE, BLOOD (ROUTINE X 2)
Culture: NO GROWTH
Culture: NO GROWTH
Special Requests: ADEQUATE
Special Requests: ADEQUATE

## 2019-07-26 ENCOUNTER — Other Ambulatory Visit: Payer: Self-pay

## 2019-07-26 ENCOUNTER — Telehealth: Payer: Self-pay

## 2019-07-26 DIAGNOSIS — M7989 Other specified soft tissue disorders: Secondary | ICD-10-CM

## 2019-07-26 DIAGNOSIS — K219 Gastro-esophageal reflux disease without esophagitis: Secondary | ICD-10-CM | POA: Diagnosis not present

## 2019-07-26 DIAGNOSIS — K2270 Barrett's esophagus without dysplasia: Secondary | ICD-10-CM | POA: Diagnosis not present

## 2019-07-26 DIAGNOSIS — I10 Essential (primary) hypertension: Secondary | ICD-10-CM | POA: Diagnosis not present

## 2019-07-26 DIAGNOSIS — J439 Emphysema, unspecified: Secondary | ICD-10-CM | POA: Diagnosis not present

## 2019-07-26 DIAGNOSIS — I714 Abdominal aortic aneurysm, without rupture: Secondary | ICD-10-CM | POA: Diagnosis not present

## 2019-07-26 DIAGNOSIS — J9621 Acute and chronic respiratory failure with hypoxia: Secondary | ICD-10-CM | POA: Diagnosis not present

## 2019-07-26 DIAGNOSIS — G4733 Obstructive sleep apnea (adult) (pediatric): Secondary | ICD-10-CM | POA: Diagnosis not present

## 2019-07-26 DIAGNOSIS — G2581 Restless legs syndrome: Secondary | ICD-10-CM | POA: Diagnosis not present

## 2019-07-26 DIAGNOSIS — C7B8 Other secondary neuroendocrine tumors: Secondary | ICD-10-CM | POA: Diagnosis not present

## 2019-07-26 DIAGNOSIS — C3431 Malignant neoplasm of lower lobe, right bronchus or lung: Secondary | ICD-10-CM | POA: Diagnosis not present

## 2019-07-26 DIAGNOSIS — D649 Anemia, unspecified: Secondary | ICD-10-CM | POA: Diagnosis not present

## 2019-07-26 DIAGNOSIS — M8580 Other specified disorders of bone density and structure, unspecified site: Secondary | ICD-10-CM | POA: Diagnosis not present

## 2019-07-26 MED ORDER — POTASSIUM CHLORIDE CRYS ER 20 MEQ PO TBCR: 20.0000 meq | EXTENDED_RELEASE_TABLET | Freq: Every day | ORAL | 3 refills | Status: AC

## 2019-07-26 NOTE — Telephone Encounter (Signed)
Cathy Watts advised of approved verbal orders for home health.  Patient advised that she may increase her furosemide to 20mg  twice daily.

## 2019-07-26 NOTE — Telephone Encounter (Signed)
Yes ok for verbal for home health.   She can also increase her furosemide to 20mg  twice daily.

## 2019-07-26 NOTE — Telephone Encounter (Signed)
Verbal ok?

## 2019-07-26 NOTE — Telephone Encounter (Signed)
Transition Care Management Follow-up Telephone Call  Date of discharge and from where: [facility] on 07/23/19  How have you been since you were released from the hospital? Doing well, pt is able to get up and go to the bathroom with assistance. Oxygen level was 99 when checked this morning. Declines SOB but is having some wheezing. Pt is coughing and seeing a thick brownish sputum. Pts feet are swollen but no better or worse than when she was in the hospital. Declines pain, fever or n/v/d.  Any questions or concerns? Yes, pt has called in about her Lasix prescription. Pt would like to know if there should be a change to the Lasix due to her feet swelling.  Items Reviewed:  Did the pt receive and understand the discharge instructions provided? Yes   Medications obtained and verified? Yes   Any new allergies since your discharge? No   Dietary orders reviewed? Yes  Do you have support at home? Yes   Other (ie: DME, Home Health, etc): [facility] was ordered for PT and a nursing aide. Pt was d/c with walker.  Functional Questionnaire: (I = Independent and D = Dependent)  Bathing/Dressing- I, daughter will assist if needed.   Meal Prep- D currently  Eating- I  Maintaining continence- D, uses pads at night for urine leakage.  Transferring/Ambulation- D, using a walker currently.  Managing Meds- D, daughters manage medications.   Follow up appointments reviewed:    PCP Hospital f/u appt confirmed? Yes , scheduled to see Fenton Malling on 07/30/19 @ 11:00 AM.  [facility] f/u appt confirmed? N/A   Are transportation arrangements needed? No   If their condition worsens, is the pt aware to call  their PCP or go to the ED? Yes  Was the patient provided with contact information for the PCP's office or ED? Yes  Was the pt encouraged to call back with questions or concerns? Yes

## 2019-07-27 ENCOUNTER — Telehealth: Payer: Self-pay | Admitting: Nurse Practitioner

## 2019-07-27 NOTE — Telephone Encounter (Signed)
Spoke with patient's daughter Cathy Watts regarding Palliative services and she was in agreement with this.  I have scheduled a Teeephone Consult for 08/17/19 @ 11 AM.

## 2019-07-28 ENCOUNTER — Other Ambulatory Visit: Payer: Self-pay | Admitting: Physician Assistant

## 2019-07-28 DIAGNOSIS — C3431 Malignant neoplasm of lower lobe, right bronchus or lung: Secondary | ICD-10-CM | POA: Diagnosis not present

## 2019-07-28 DIAGNOSIS — K219 Gastro-esophageal reflux disease without esophagitis: Secondary | ICD-10-CM | POA: Diagnosis not present

## 2019-07-28 DIAGNOSIS — I714 Abdominal aortic aneurysm, without rupture: Secondary | ICD-10-CM | POA: Diagnosis not present

## 2019-07-28 DIAGNOSIS — C7B8 Other secondary neuroendocrine tumors: Secondary | ICD-10-CM | POA: Diagnosis not present

## 2019-07-28 DIAGNOSIS — J439 Emphysema, unspecified: Secondary | ICD-10-CM | POA: Diagnosis not present

## 2019-07-28 DIAGNOSIS — J9621 Acute and chronic respiratory failure with hypoxia: Secondary | ICD-10-CM | POA: Diagnosis not present

## 2019-07-28 DIAGNOSIS — G4733 Obstructive sleep apnea (adult) (pediatric): Secondary | ICD-10-CM | POA: Diagnosis not present

## 2019-07-28 DIAGNOSIS — K2100 Gastro-esophageal reflux disease with esophagitis, without bleeding: Secondary | ICD-10-CM

## 2019-07-28 DIAGNOSIS — I10 Essential (primary) hypertension: Secondary | ICD-10-CM | POA: Diagnosis not present

## 2019-07-28 DIAGNOSIS — M8580 Other specified disorders of bone density and structure, unspecified site: Secondary | ICD-10-CM | POA: Diagnosis not present

## 2019-07-28 DIAGNOSIS — K2270 Barrett's esophagus without dysplasia: Secondary | ICD-10-CM | POA: Diagnosis not present

## 2019-07-28 DIAGNOSIS — G2581 Restless legs syndrome: Secondary | ICD-10-CM | POA: Diagnosis not present

## 2019-07-28 DIAGNOSIS — D649 Anemia, unspecified: Secondary | ICD-10-CM | POA: Diagnosis not present

## 2019-07-28 NOTE — Telephone Encounter (Signed)
Recommend making an appointment sometime after 2 April.

## 2019-07-28 NOTE — Telephone Encounter (Signed)
Please advise on what dates you would like to look at when you return to clinic.

## 2019-07-28 NOTE — Telephone Encounter (Signed)
Patient is aware and she will have to be seen in the office again before she can have a bronch due to it being over 30 days.

## 2019-07-28 NOTE — Telephone Encounter (Signed)
I have to coordinate with 4 departments for this particular procedure.  I will try to get it on the books today but may be when I get back.

## 2019-07-29 NOTE — Progress Notes (Signed)
Patient: Cathy Watts Female    DOB: 02/15/1946   74 y.o.   MRN: [id number] Visit Date: 07/30/2019  Today's Provider: Mar Daring, PA-C   Chief Complaint  Patient presents with  . Hospitalization Follow-up   Subjective:     Virtual Visit via Telephone Note  I connected with TRIVIA HEFFELFINGER on 07/30/19 at 11:00 AM EDT by telephone and verified that I am speaking with the correct person using two identifiers.  Location: Patient: Home; daughter, [name] was present through the visit as well.  Provider: [facility]   I discussed the limitations, risks, security and privacy concerns of performing an evaluation and management service by telephone and the availability of in person appointments. I also discussed with the patient that there may be a patient responsible charge related to this service. The patient expressed understanding and agreed to proceed.  HPI   Follow up Hospitalization  Patient was admitted to [facility] on 07/19/19 and discharged on 07/23/19. She was treated for Covid-19 Viral Pneumonia. Treatment for this included Ptwas on HFNC 35 liter/min after anxiety spell-->now down to 2L/min [place] (her baseline) s/p IV remdisivir 3/15=>3/19 and Po bactrim--empiric  IV decadron, switched to oral Decadron 6 mg p.o. daily for 5 additional days.  s/pon vitamin D3, vitamin C, zinc sulfate, p.o. Pepcid and aspirin given during hospital stay.  Continue DuoNeb and inhalers at home.  Continue vitamin C for 2 weeks.  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 S/p IV Cardizem drip --weaned off  --Continue metoprolol succinate and cont Cardizem CD 360 mg qd, and Eliquis. -seen by Cardiology PA and Dr fath--rec out pt ischemia w/u  Palliative care following patient-outpatient  Telephone follow up was done on 07/26/2019 She reports  excellent compliance with treatment. ------------------------------------------------------------------------------------   Allergies  Allergen Reactions  . Amoxicillin Hives and Itching    Did it involve swelling of the face/tongue/throat, SOB, or low BP? No Did it involve sudden or severe rash/hives, skin peeling, or any reaction on the inside of your mouth or nose? No Did you need to seek medical attention at a hospital or doctor's office? No When did it last happen?2018 If all above answers are "NO", may proceed with cephalosporin use.     . Chlorhexidine Dermatitis    Patient refused     Current Outpatient Medications:  .  acetaminophen (TYLENOL) 325 MG tablet, Take 2 tablets (650 mg total) by mouth every 6 (six) hours as needed for mild pain or headache (fever >/= 101)., Disp:  , Rfl:  .  apixaban (ELIQUIS) 5 MG TABS tablet, Take 1 tablet (5 mg total) by mouth 2 (two) times daily., Disp: 60 tablet, Rfl: 1 .  busPIRone (BUSPAR) 30 MG tablet, Take 1 tablet by mouth twice daily (Patient taking differently: Take 30 mg by mouth 2 (two) times daily. ), Disp: 180 tablet, Rfl: 1 .  diltiazem (CARDIZEM CD) 360 MG 24 hr capsule, Take 1 capsule (360 mg total) by mouth daily., Disp: 30 capsule, Rfl: 0 .  escitalopram (LEXAPRO) 20 MG tablet, Take 1 tablet by mouth once daily (Patient taking differently: Take 20 mg by mouth daily. ), Disp: 90 tablet, Rfl: 1 .  Fluticasone-Umeclidin-Vilant (TRELEGY ELLIPTA) 100-62.5-25 MCG/INH AEPB, Inhale 1 puff into the lungs daily., Disp: 180 each, Rfl: 3 .  furosemide (LASIX) 20 MG  tablet, Take 1 tablet (20 mg total) by mouth daily. (Patient taking differently: Take 20 mg by mouth daily. ), Disp: 90 tablet, Rfl: 1 .  ipratropium (ATROVENT HFA) 17 MCG/ACT inhaler, Inhale 2 puffs into the lungs 4 (four) times daily., Disp: 1 Inhaler, Rfl: 2 .  ipratropium-albuterol (DUONEB) 0.5-2.5 (3) MG/3ML SOLN, Take 3 mLs by nebulization every 6 (six) hours as needed.  (Patient taking differently: Take 3 mLs by nebulization every 6 (six) hours as needed (shortness of breath). ), Disp: 360 mL, Rfl: 5 .  Magnesium 250 MG TABS, Take 250 mg by mouth 2 (two) times daily. , Disp: , Rfl:  .  metoprolol tartrate (LOPRESSOR) 50 MG tablet, Take 50 mg by mouth 2 (two) times daily., Disp: , Rfl:  .  nystatin (MYCOSTATIN) 100000 UNIT/ML suspension, Take 5 mLs (500,000 Units total) by mouth 4 (four) times daily., Disp: 240 mL, Rfl: 0 .  omeprazole (PRILOSEC) 40 MG capsule, Take 1 capsule by mouth once daily, Disp: 90 capsule, Rfl: 0 .  polyethylene glycol powder (GLYCOLAX/MIRALAX) powder, TAKE 17 GRAMS OF POWDER MIXED IN 8 OUNCES OF WATER BY MOUTH ONCE A DAY., Disp: 255 g, Rfl: 3 .  potassium chloride SA (KLOR-CON) 20 MEQ tablet, Take 1 tablet (20 mEq total) by mouth daily., Disp: 30 tablet, Rfl: 3 .  pravastatin (PRAVACHOL) 20 MG tablet, TAKE 1 TABLET BY MOUTH AT BEDTIME (Patient taking differently: Take 20 mg by mouth daily. ), Disp: 90 tablet, Rfl: 1 .  PROAIR HFA 108 (90 Base) MCG/ACT inhaler, INHALE 2 PUFFS BY MOUTH EVERY 4 HOURS AS NEEDED FOR WHEEZING FOR SHORTNESS OF BREATH (Patient taking differently: Inhale 2 puffs into the lungs every 4 (four) hours as needed for wheezing or shortness of breath. ), Disp: 9 g, Rfl: 0 .  Respiratory Therapy Supplies (FLUTTER) DEVI, 1 each by Does not apply route QID., Disp: 1 each, Rfl: 0 .  rOPINIRole (REQUIP) 3 MG tablet, TAKE 1 TABLET BY MOUTH AT BEDTIME, Disp: 90 tablet, Rfl: 2 .  sucralfate (CARAFATE) 1 g tablet, Take 1 g by mouth 3 (three) times daily. DISSOLVE IN THREE TO FOUR TABLESPOONFULS OF WARM WARM SWISH AND SWALLOW, Disp: , Rfl:  .  vitamin C (VITAMIN C) 500 MG tablet, Take 1 tablet (500 mg total) by mouth daily for 14 days., Disp: 14 tablet, Rfl: 0 .  guaiFENesin-dextromethorphan (ROBITUSSIN DM) 100-10 MG/5ML syrup, Take 5 mLs by mouth every 4 (four) hours as needed for cough. (Patient not taking: Reported on 07/09/2019), Disp:  118 mL, Rfl: 0 .  levofloxacin (LEVAQUIN) 750 MG tablet, Take 750 mg by mouth daily., Disp: , Rfl:   Review of Systems  Constitutional: Positive for fatigue. Negative for chills and fever.  HENT: Negative.   Respiratory: Positive for shortness of breath. Negative for cough and chest tightness.   Cardiovascular: Negative.   Gastrointestinal: Negative.   Neurological: Positive for weakness. Negative for dizziness, light-headedness and headaches.    Social History   Tobacco Use  . Smoking status: Former Smoker    Packs/day: 1.00    Years: 55.00    Pack years: 55.00    Types: Cigarettes    Quit date: 11/02/2017    Years since quitting: 1.7  . Smokeless tobacco: Never Used  Substance Use Topics  . Alcohol use: Yes    Alcohol/week: 0.0 standard drinks    Comment: occasionally- wine      Objective:   There were no vitals taken for this visit. There were  no vitals filed for this visit.There is no height or weight on file to calculate BMI.   Physical Exam Vitals reviewed.  Constitutional:      General: She is not in acute distress. Pulmonary:     Effort: No respiratory distress (sounds much better today over the phone; able to talk in full sentences and no audible wheezing).  Neurological:     Mental Status: She is alert.     No results found for any visits on 07/30/19.     Assessment & Plan     1. Encounter for support and coordination of transition of care Hospital notes, consults, labs, and imaging from 07/19/19-07/23/19 all reviewed prior to today's appt. Telephone f/u was done on 07/26/19 and reviewed by me.   2. Pneumonia due to COVID-19 virus Much improved. Completed IV remdesivir, decadron. Took last decadron today. Continue duoneb and inhalers as prescribed.   3. Postobstructive pneumonia Had been scheduled for a bronchoscopy with biopsy and stent placement but was cancelled due to hospitalization. In process of being rescheduled with Dr. Patsey Berthold, pulmonology.     4. Malignant neoplasm of hilus of left lung ([place]) Has chest CT and f/u appts with Dr. Rogue Bussing and Dr. Baruch Gouty coming up early April.   5. Acute on chronic respiratory failure with hypoxia (HCC) Improved. Uses 2L O2 [place]. Using flutter device.   6. Situational anxiety Has panic attacks when she becomes SOB that exacerbate the situations. Will give Clonazepam as below for prn use during those episodes.  - clonazePAM (KLONOPIN) 0.5 MG tablet; Take 0.5-1 tablets (0.25-0.5 mg total) by mouth 2 (two) times daily as needed for anxiety.  Dispense: 20 tablet; Refill: 1  7. DOE (dyspnea on exertion) Improving with PT and moving. Will send Rx for patient to get a rollator walker with seat to help with upcoming appointments.   8. Generalized weakness In HHPT. Improving slowly. See above.   9. Physical deconditioning See above medical treatment plan.   I discussed the assessment and treatment plan with the patient. The patient was provided an opportunity to ask questions and all were answered. The patient agreed with the plan and demonstrated an understanding of the instructions.   The patient was advised to call back or seek an in-person evaluation if the symptoms worsen or if the condition fails to improve as anticipated.  I provided 28 minutes of non-face-to-face time during this encounter.    Mar Daring, PA-C  [facility]

## 2019-07-30 ENCOUNTER — Other Ambulatory Visit: Payer: Self-pay | Admitting: Physician Assistant

## 2019-07-30 ENCOUNTER — Encounter: Payer: Self-pay | Admitting: Physician Assistant

## 2019-07-30 ENCOUNTER — Ambulatory Visit (INDEPENDENT_AMBULATORY_CARE_PROVIDER_SITE_OTHER): Payer: Medicare Other | Admitting: Physician Assistant

## 2019-07-30 ENCOUNTER — Telehealth: Payer: Self-pay | Admitting: Physician Assistant

## 2019-07-30 VITALS — BP 113/76 | HR 68

## 2019-07-30 DIAGNOSIS — B370 Candidal stomatitis: Secondary | ICD-10-CM

## 2019-07-30 DIAGNOSIS — C3402 Malignant neoplasm of left main bronchus: Secondary | ICD-10-CM | POA: Diagnosis not present

## 2019-07-30 DIAGNOSIS — J9621 Acute and chronic respiratory failure with hypoxia: Secondary | ICD-10-CM | POA: Diagnosis not present

## 2019-07-30 DIAGNOSIS — R5381 Other malaise: Secondary | ICD-10-CM | POA: Insufficient documentation

## 2019-07-30 DIAGNOSIS — Z7689 Persons encountering health services in other specified circumstances: Secondary | ICD-10-CM

## 2019-07-30 DIAGNOSIS — U071 COVID-19: Secondary | ICD-10-CM | POA: Diagnosis not present

## 2019-07-30 DIAGNOSIS — J189 Pneumonia, unspecified organism: Secondary | ICD-10-CM | POA: Diagnosis not present

## 2019-07-30 DIAGNOSIS — R0609 Other forms of dyspnea: Secondary | ICD-10-CM

## 2019-07-30 DIAGNOSIS — R531 Weakness: Secondary | ICD-10-CM | POA: Insufficient documentation

## 2019-07-30 DIAGNOSIS — R0600 Dyspnea, unspecified: Secondary | ICD-10-CM

## 2019-07-30 DIAGNOSIS — J1282 Pneumonia due to coronavirus disease 2019: Secondary | ICD-10-CM

## 2019-07-30 DIAGNOSIS — F418 Other specified anxiety disorders: Secondary | ICD-10-CM

## 2019-07-30 MED ORDER — CLONAZEPAM 0.5 MG PO TABS: 0.2500 mg | ORAL_TABLET | Freq: Two times a day (BID) | ORAL | 1 refills | Status: AC | PRN

## 2019-07-30 NOTE — Telephone Encounter (Signed)
Requested medication (s) are due for refill today -yes  Requested medication (s) are on the active medication list -yes  Future visit scheduled -no- seen today  Last refill: 06/21/19  Notes to clinic: Request for medication not assigned to protocol.  Requested Prescriptions  Pending Prescriptions Disp Refills   nystatin (MYCOSTATIN) 100000 UNIT/ML suspension [Pharmacy Med Name: Nystatin 100000 UNIT/ML Mouth/Throat Suspension] 240 mL 0    Sig: TAKE 5 ML BY MOUTH 4 TIMES DAILY      Off-Protocol Failed - 07/30/2019  3:57 PM      Failed - Medication not assigned to a protocol, review manually.      Passed - Valid encounter within last 12 months    Recent Outpatient Visits           Today Encounter for support and coordination of transition of care   [facility] [place], [place], [place]   3 weeks ago Acute respiratory distress   [place], [place]   4 weeks ago COPD, moderate [facility])   [place], [place], [place]   1 month ago COPD with acute exacerbation [facility])   [facility] [place], [place], [place]   3 months ago Annual physical exam   [place], [place], [place]       Future Appointments             In 3 weeks Flora Lipps, MD [facility] Pulmonary [place]                Requested Prescriptions  Pending Prescriptions Disp Refills   nystatin (MYCOSTATIN) 100000 UNIT/ML suspension [Pharmacy Med Name: Nystatin 100000 UNIT/ML Mouth/Throat Suspension] 240 mL 0    Sig: TAKE 5 ML BY MOUTH 4 TIMES DAILY      Off-Protocol Failed - 07/30/2019  3:57 PM      Failed - Medication not assigned to a protocol, review manually.      Passed - Valid encounter within last 12 months    Recent Outpatient Visits           Today Encounter for support and coordination of transition of care   [place], [place]   3 weeks ago Acute  respiratory distress   [place], [place]   4 weeks ago COPD, moderate [facility])   [place], [place], [place]   1 month ago COPD with acute exacerbation [facility])   [place], [place], [place]   3 months ago Annual physical exam   [place], [place], [place]       Future Appointments             In 3 weeks Flora Lipps, MD [place]

## 2019-07-30 NOTE — Telephone Encounter (Signed)
Cathy Watts calling with [facility] called and stated that she would like verbals for start of care for skilled nursing. Please advise

## 2019-07-30 NOTE — Telephone Encounter (Signed)
Pt currently has an appt scheduled with Dr. Mortimer Fries 4/20 for a 4-week f/u per TP. Dr. Patsey Berthold, please advise if you are fine with pt keeping that appt or if we should move pt's appt up sooner?

## 2019-07-30 NOTE — Telephone Encounter (Signed)
Cathy Watts with [facility] was advised ok for orders

## 2019-07-30 NOTE — Telephone Encounter (Signed)
Yes please this is ok

## 2019-08-03 DIAGNOSIS — K2270 Barrett's esophagus without dysplasia: Secondary | ICD-10-CM | POA: Diagnosis not present

## 2019-08-03 DIAGNOSIS — I714 Abdominal aortic aneurysm, without rupture: Secondary | ICD-10-CM | POA: Diagnosis not present

## 2019-08-03 DIAGNOSIS — G2581 Restless legs syndrome: Secondary | ICD-10-CM | POA: Diagnosis not present

## 2019-08-03 DIAGNOSIS — J439 Emphysema, unspecified: Secondary | ICD-10-CM | POA: Diagnosis not present

## 2019-08-03 DIAGNOSIS — K219 Gastro-esophageal reflux disease without esophagitis: Secondary | ICD-10-CM | POA: Diagnosis not present

## 2019-08-03 DIAGNOSIS — C7B8 Other secondary neuroendocrine tumors: Secondary | ICD-10-CM | POA: Diagnosis not present

## 2019-08-03 DIAGNOSIS — C3431 Malignant neoplasm of lower lobe, right bronchus or lung: Secondary | ICD-10-CM | POA: Diagnosis not present

## 2019-08-03 DIAGNOSIS — D649 Anemia, unspecified: Secondary | ICD-10-CM | POA: Diagnosis not present

## 2019-08-03 DIAGNOSIS — M8580 Other specified disorders of bone density and structure, unspecified site: Secondary | ICD-10-CM | POA: Diagnosis not present

## 2019-08-03 DIAGNOSIS — G4733 Obstructive sleep apnea (adult) (pediatric): Secondary | ICD-10-CM | POA: Diagnosis not present

## 2019-08-03 DIAGNOSIS — J9621 Acute and chronic respiratory failure with hypoxia: Secondary | ICD-10-CM | POA: Diagnosis not present

## 2019-08-03 DIAGNOSIS — I10 Essential (primary) hypertension: Secondary | ICD-10-CM | POA: Diagnosis not present

## 2019-08-03 NOTE — Telephone Encounter (Signed)
Dr. Darnell Level will be back in office the week of April 8.

## 2019-08-04 ENCOUNTER — Telehealth: Payer: Self-pay | Admitting: Pulmonary Disease

## 2019-08-04 ENCOUNTER — Telehealth: Payer: Self-pay

## 2019-08-04 NOTE — Telephone Encounter (Signed)
Copied from [place] [id number]. Topic: General - Other >> Aug 04, 2019 12:31 PM Leonides Schanz, Ja-Kwan wrote: Reason for CRM: Pt daughter Gwenith Spitz stated she was contacted by [facility] and advised that they have requested additional information regarding the Rx for the rollator, transport chair, and humidifier but have yet to receive any additional information. [name] stated she was advised that the order will not be processed without the additional information and if the information is not received today by 5 pm the order will not be processed.

## 2019-08-04 NOTE — Telephone Encounter (Signed)
I called and spoke with patient's daughter and made her aware that due to the timing of her being in the hospital and Dr. Patsey Berthold being out of the office it will have to be rescheduled until after she returns. Dr. Patsey Berthold states that she will have to be seen in the office again due to it being so long in between a visit and procedure. The daughter voiced her concern that she has to wait so long and does not want her to end up back into the hospital. I also advised her with the patient recently having been hospitalized due to Covid there are protocols in place in regards to her coming in for a procedure. I will hold this in triage awaiting for Dr. Synthia Innocent return.

## 2019-08-04 NOTE — Telephone Encounter (Signed)
She's upset because she states that they were informed it was emergent procedure that was supposed to be done but her mom was in the hospital and she was not able to get the bronch done. She said she feels like she may need to be seen elsewhere because Dr. Patsey Berthold is not available asap to get the bronch scheduled. She states this is the third time she has called and no one has called her back.

## 2019-08-05 ENCOUNTER — Encounter: Payer: Self-pay | Admitting: Family Medicine

## 2019-08-05 ENCOUNTER — Other Ambulatory Visit: Payer: Self-pay

## 2019-08-05 ENCOUNTER — Telehealth: Payer: Self-pay | Admitting: Physician Assistant

## 2019-08-05 ENCOUNTER — Ambulatory Visit
Admission: RE | Admit: 2019-08-05 | Discharge: 2019-08-05 | Disposition: A | Discharge status: Home / Self Care | Payer: Medicare Other | Source: Ambulatory Visit | Attending: Radiation Oncology | Admitting: Radiation Oncology

## 2019-08-05 DIAGNOSIS — C3431 Malignant neoplasm of lower lobe, right bronchus or lung: Secondary | ICD-10-CM | POA: Diagnosis not present

## 2019-08-05 DIAGNOSIS — Z7901 Long term (current) use of anticoagulants: Secondary | ICD-10-CM | POA: Diagnosis not present

## 2019-08-05 DIAGNOSIS — M8580 Other specified disorders of bone density and structure, unspecified site: Secondary | ICD-10-CM | POA: Diagnosis not present

## 2019-08-05 DIAGNOSIS — R079 Chest pain, unspecified: Secondary | ICD-10-CM | POA: Diagnosis not present

## 2019-08-05 DIAGNOSIS — C3490 Malignant neoplasm of unspecified part of unspecified bronchus or lung: Secondary | ICD-10-CM | POA: Diagnosis not present

## 2019-08-05 DIAGNOSIS — K219 Gastro-esophageal reflux disease without esophagitis: Secondary | ICD-10-CM | POA: Diagnosis not present

## 2019-08-05 DIAGNOSIS — J439 Emphysema, unspecified: Secondary | ICD-10-CM | POA: Diagnosis not present

## 2019-08-05 DIAGNOSIS — I714 Abdominal aortic aneurysm, without rupture: Secondary | ICD-10-CM | POA: Diagnosis not present

## 2019-08-05 DIAGNOSIS — J90 Pleural effusion, not elsewhere classified: Secondary | ICD-10-CM | POA: Diagnosis not present

## 2019-08-05 DIAGNOSIS — K2270 Barrett's esophagus without dysplasia: Secondary | ICD-10-CM | POA: Diagnosis not present

## 2019-08-05 DIAGNOSIS — I10 Essential (primary) hypertension: Secondary | ICD-10-CM | POA: Diagnosis not present

## 2019-08-05 DIAGNOSIS — I4891 Unspecified atrial fibrillation: Secondary | ICD-10-CM | POA: Diagnosis not present

## 2019-08-05 DIAGNOSIS — I2510 Atherosclerotic heart disease of native coronary artery without angina pectoris: Secondary | ICD-10-CM | POA: Diagnosis not present

## 2019-08-05 DIAGNOSIS — C3400 Malignant neoplasm of unspecified main bronchus: Secondary | ICD-10-CM | POA: Diagnosis not present

## 2019-08-05 DIAGNOSIS — C7A8 Other malignant neuroendocrine tumors: Secondary | ICD-10-CM | POA: Diagnosis not present

## 2019-08-05 DIAGNOSIS — J449 Chronic obstructive pulmonary disease, unspecified: Secondary | ICD-10-CM | POA: Diagnosis not present

## 2019-08-05 DIAGNOSIS — E8809 Other disorders of plasma-protein metabolism, not elsewhere classified: Secondary | ICD-10-CM | POA: Diagnosis not present

## 2019-08-05 DIAGNOSIS — Z9981 Dependence on supplemental oxygen: Secondary | ICD-10-CM | POA: Diagnosis not present

## 2019-08-05 DIAGNOSIS — I509 Heart failure, unspecified: Secondary | ICD-10-CM | POA: Diagnosis not present

## 2019-08-05 DIAGNOSIS — J189 Pneumonia, unspecified organism: Secondary | ICD-10-CM | POA: Diagnosis not present

## 2019-08-05 DIAGNOSIS — E785 Hyperlipidemia, unspecified: Secondary | ICD-10-CM | POA: Diagnosis not present

## 2019-08-05 DIAGNOSIS — I4892 Unspecified atrial flutter: Secondary | ICD-10-CM | POA: Diagnosis not present

## 2019-08-05 DIAGNOSIS — Z79899 Other long term (current) drug therapy: Secondary | ICD-10-CM | POA: Diagnosis not present

## 2019-08-05 DIAGNOSIS — M7989 Other specified soft tissue disorders: Secondary | ICD-10-CM | POA: Diagnosis not present

## 2019-08-05 DIAGNOSIS — G4730 Sleep apnea, unspecified: Secondary | ICD-10-CM | POA: Diagnosis not present

## 2019-08-05 DIAGNOSIS — Z8616 Personal history of COVID-19: Secondary | ICD-10-CM | POA: Diagnosis not present

## 2019-08-05 DIAGNOSIS — I959 Hypotension, unspecified: Secondary | ICD-10-CM | POA: Diagnosis not present

## 2019-08-05 DIAGNOSIS — R531 Weakness: Secondary | ICD-10-CM | POA: Diagnosis not present

## 2019-08-05 DIAGNOSIS — R0600 Dyspnea, unspecified: Secondary | ICD-10-CM | POA: Diagnosis not present

## 2019-08-05 DIAGNOSIS — Z8711 Personal history of peptic ulcer disease: Secondary | ICD-10-CM | POA: Diagnosis not present

## 2019-08-05 DIAGNOSIS — C3430 Malignant neoplasm of lower lobe, unspecified bronchus or lung: Secondary | ICD-10-CM | POA: Diagnosis not present

## 2019-08-05 DIAGNOSIS — C3412 Malignant neoplasm of upper lobe, left bronchus or lung: Secondary | ICD-10-CM | POA: Insufficient documentation

## 2019-08-05 DIAGNOSIS — J9621 Acute and chronic respiratory failure with hypoxia: Secondary | ICD-10-CM | POA: Diagnosis not present

## 2019-08-05 DIAGNOSIS — R918 Other nonspecific abnormal finding of lung field: Secondary | ICD-10-CM | POA: Diagnosis not present

## 2019-08-05 DIAGNOSIS — J441 Chronic obstructive pulmonary disease with (acute) exacerbation: Secondary | ICD-10-CM | POA: Diagnosis not present

## 2019-08-05 DIAGNOSIS — G2581 Restless legs syndrome: Secondary | ICD-10-CM | POA: Diagnosis not present

## 2019-08-05 DIAGNOSIS — D649 Anemia, unspecified: Secondary | ICD-10-CM | POA: Diagnosis not present

## 2019-08-05 DIAGNOSIS — G4733 Obstructive sleep apnea (adult) (pediatric): Secondary | ICD-10-CM | POA: Diagnosis not present

## 2019-08-05 DIAGNOSIS — I482 Chronic atrial fibrillation, unspecified: Secondary | ICD-10-CM | POA: Diagnosis not present

## 2019-08-05 DIAGNOSIS — C7B8 Other secondary neuroendocrine tumors: Secondary | ICD-10-CM | POA: Diagnosis not present

## 2019-08-05 MED ORDER — IOHEXOL 300 MG/ML  SOLN
75.0000 mL | Freq: Once | INTRAMUSCULAR | Status: AC | PRN
  Administered 2019-08-05: 75 mL via INTRAVENOUS

## 2019-08-05 NOTE — Telephone Encounter (Signed)
Please advise 

## 2019-08-05 NOTE — Telephone Encounter (Signed)
Mendel Ryder was given verbal okay.

## 2019-08-05 NOTE — Telephone Encounter (Signed)
Yes this is ok to check potassium

## 2019-08-05 NOTE — Telephone Encounter (Signed)
Office notes faxed today. Couldn't be done yesterday since CMA and Provider not in the office.

## 2019-08-05 NOTE — Telephone Encounter (Signed)
Cathy Watts, [facility], wants to visit an extra visit this week to make sure that her potassium levels are okay. Her cardiologist upped her dosage this week. Best contact: [phone]

## 2019-08-06 ENCOUNTER — Emergency Department: Payer: Medicare Other

## 2019-08-06 ENCOUNTER — Inpatient Hospital Stay
Admission: EM | Admit: 2019-08-06 | Discharge: 2019-08-09 | DRG: 843 | Disposition: A | Discharge status: Home Health | Payer: Medicare Other | Attending: Internal Medicine | Admitting: Internal Medicine

## 2019-08-06 ENCOUNTER — Other Ambulatory Visit: Payer: Self-pay

## 2019-08-06 DIAGNOSIS — J90 Pleural effusion, not elsewhere classified: Secondary | ICD-10-CM | POA: Diagnosis present

## 2019-08-06 DIAGNOSIS — Z85118 Personal history of other malignant neoplasm of bronchus and lung: Secondary | ICD-10-CM

## 2019-08-06 DIAGNOSIS — C3401 Malignant neoplasm of right main bronchus: Secondary | ICD-10-CM | POA: Diagnosis not present

## 2019-08-06 DIAGNOSIS — Z7901 Long term (current) use of anticoagulants: Secondary | ICD-10-CM

## 2019-08-06 DIAGNOSIS — K219 Gastro-esophageal reflux disease without esophagitis: Secondary | ICD-10-CM | POA: Diagnosis present

## 2019-08-06 DIAGNOSIS — Z8711 Personal history of peptic ulcer disease: Secondary | ICD-10-CM

## 2019-08-06 DIAGNOSIS — R531 Weakness: Secondary | ICD-10-CM | POA: Diagnosis not present

## 2019-08-06 DIAGNOSIS — Z9981 Dependence on supplemental oxygen: Secondary | ICD-10-CM

## 2019-08-06 DIAGNOSIS — M7989 Other specified soft tissue disorders: Secondary | ICD-10-CM

## 2019-08-06 DIAGNOSIS — E8809 Other disorders of plasma-protein metabolism, not elsewhere classified: Secondary | ICD-10-CM | POA: Diagnosis present

## 2019-08-06 DIAGNOSIS — C3400 Malignant neoplasm of unspecified main bronchus: Secondary | ICD-10-CM | POA: Diagnosis present

## 2019-08-06 DIAGNOSIS — Z6841 Body Mass Index (BMI) 40.0 and over, adult: Secondary | ICD-10-CM

## 2019-08-06 DIAGNOSIS — Z87891 Personal history of nicotine dependence: Secondary | ICD-10-CM

## 2019-08-06 DIAGNOSIS — R918 Other nonspecific abnormal finding of lung field: Secondary | ICD-10-CM | POA: Diagnosis not present

## 2019-08-06 DIAGNOSIS — G4710 Hypersomnia, unspecified: Secondary | ICD-10-CM | POA: Diagnosis not present

## 2019-08-06 DIAGNOSIS — C3402 Malignant neoplasm of left main bronchus: Secondary | ICD-10-CM | POA: Diagnosis not present

## 2019-08-06 DIAGNOSIS — C7A8 Other malignant neuroendocrine tumors: Principal | ICD-10-CM | POA: Diagnosis present

## 2019-08-06 DIAGNOSIS — J9621 Acute and chronic respiratory failure with hypoxia: Secondary | ICD-10-CM | POA: Diagnosis present

## 2019-08-06 DIAGNOSIS — I482 Chronic atrial fibrillation, unspecified: Secondary | ICD-10-CM | POA: Diagnosis present

## 2019-08-06 DIAGNOSIS — F3341 Major depressive disorder, recurrent, in partial remission: Secondary | ICD-10-CM | POA: Diagnosis not present

## 2019-08-06 DIAGNOSIS — J439 Emphysema, unspecified: Secondary | ICD-10-CM | POA: Diagnosis present

## 2019-08-06 DIAGNOSIS — G2581 Restless legs syndrome: Secondary | ICD-10-CM | POA: Diagnosis present

## 2019-08-06 DIAGNOSIS — I959 Hypotension, unspecified: Secondary | ICD-10-CM | POA: Diagnosis present

## 2019-08-06 DIAGNOSIS — J449 Chronic obstructive pulmonary disease, unspecified: Secondary | ICD-10-CM | POA: Diagnosis present

## 2019-08-06 DIAGNOSIS — C3490 Malignant neoplasm of unspecified part of unspecified bronchus or lung: Secondary | ICD-10-CM | POA: Diagnosis present

## 2019-08-06 DIAGNOSIS — E785 Hyperlipidemia, unspecified: Secondary | ICD-10-CM | POA: Diagnosis present

## 2019-08-06 DIAGNOSIS — I2510 Atherosclerotic heart disease of native coronary artery without angina pectoris: Secondary | ICD-10-CM | POA: Diagnosis present

## 2019-08-06 DIAGNOSIS — Z8249 Family history of ischemic heart disease and other diseases of the circulatory system: Secondary | ICD-10-CM

## 2019-08-06 DIAGNOSIS — Z8616 Personal history of COVID-19: Secondary | ICD-10-CM

## 2019-08-06 DIAGNOSIS — I509 Heart failure, unspecified: Secondary | ICD-10-CM

## 2019-08-06 DIAGNOSIS — F329 Major depressive disorder, single episode, unspecified: Secondary | ICD-10-CM | POA: Diagnosis present

## 2019-08-06 DIAGNOSIS — I4892 Unspecified atrial flutter: Secondary | ICD-10-CM | POA: Diagnosis present

## 2019-08-06 DIAGNOSIS — Z743 Need for continuous supervision: Secondary | ICD-10-CM | POA: Diagnosis not present

## 2019-08-06 DIAGNOSIS — Z8041 Family history of malignant neoplasm of ovary: Secondary | ICD-10-CM | POA: Diagnosis not present

## 2019-08-06 DIAGNOSIS — J441 Chronic obstructive pulmonary disease with (acute) exacerbation: Secondary | ICD-10-CM | POA: Diagnosis not present

## 2019-08-06 DIAGNOSIS — Z79899 Other long term (current) drug therapy: Secondary | ICD-10-CM | POA: Diagnosis not present

## 2019-08-06 DIAGNOSIS — U071 COVID-19: Secondary | ICD-10-CM | POA: Diagnosis not present

## 2019-08-06 DIAGNOSIS — Z888 Allergy status to other drugs, medicaments and biological substances status: Secondary | ICD-10-CM

## 2019-08-06 DIAGNOSIS — I714 Abdominal aortic aneurysm, without rupture: Secondary | ICD-10-CM | POA: Diagnosis present

## 2019-08-06 DIAGNOSIS — M8580 Other specified disorders of bone density and structure, unspecified site: Secondary | ICD-10-CM | POA: Diagnosis present

## 2019-08-06 DIAGNOSIS — Z880 Allergy status to penicillin: Secondary | ICD-10-CM

## 2019-08-06 DIAGNOSIS — R0902 Hypoxemia: Secondary | ICD-10-CM | POA: Diagnosis not present

## 2019-08-06 DIAGNOSIS — R609 Edema, unspecified: Secondary | ICD-10-CM | POA: Diagnosis not present

## 2019-08-06 DIAGNOSIS — J9611 Chronic respiratory failure with hypoxia: Secondary | ICD-10-CM | POA: Diagnosis not present

## 2019-08-06 DIAGNOSIS — G4730 Sleep apnea, unspecified: Secondary | ICD-10-CM | POA: Diagnosis present

## 2019-08-06 DIAGNOSIS — C3430 Malignant neoplasm of lower lobe, unspecified bronchus or lung: Secondary | ICD-10-CM | POA: Diagnosis not present

## 2019-08-06 DIAGNOSIS — Z800 Family history of malignant neoplasm of digestive organs: Secondary | ICD-10-CM | POA: Diagnosis not present

## 2019-08-06 DIAGNOSIS — I4891 Unspecified atrial fibrillation: Secondary | ICD-10-CM | POA: Diagnosis present

## 2019-08-06 DIAGNOSIS — R079 Chest pain, unspecified: Secondary | ICD-10-CM | POA: Diagnosis not present

## 2019-08-06 DIAGNOSIS — F32A Depression, unspecified: Secondary | ICD-10-CM | POA: Diagnosis present

## 2019-08-06 LAB — HEPATIC FUNCTION PANEL
ALT: 47 U/L — ABNORMAL HIGH (ref 0–44)
AST: 24 U/L (ref 15–41)
Albumin: 2.6 g/dL — ABNORMAL LOW (ref 3.5–5.0)
Alkaline Phosphatase: 67 U/L (ref 38–126)
Bilirubin, Direct: 0.1 mg/dL (ref 0.0–0.2)
Indirect Bilirubin: 0.6 mg/dL (ref 0.3–0.9)
Total Bilirubin: 0.7 mg/dL (ref 0.3–1.2)
Total Protein: 6.4 g/dL — ABNORMAL LOW (ref 6.5–8.1)

## 2019-08-06 LAB — CBC
HCT: 34.6 % — ABNORMAL LOW (ref 36.0–46.0)
Hemoglobin: 10.8 g/dL — ABNORMAL LOW (ref 12.0–15.0)
MCH: 28 pg (ref 26.0–34.0)
MCHC: 31.2 g/dL (ref 30.0–36.0)
MCV: 89.6 fL (ref 80.0–100.0)
Platelets: 236 10*3/uL (ref 150–400)
RBC: 3.86 MIL/uL — ABNORMAL LOW (ref 3.87–5.11)
RDW: 16.5 % — ABNORMAL HIGH (ref 11.5–15.5)
WBC: 11.5 10*3/uL — ABNORMAL HIGH (ref 4.0–10.5)
nRBC: 0 % (ref 0.0–0.2)

## 2019-08-06 LAB — BASIC METABOLIC PANEL
Anion gap: 10 (ref 5–15)
BUN: 15 mg/dL (ref 8–23)
CO2: 30 mmol/L (ref 22–32)
Calcium: 8.5 mg/dL — ABNORMAL LOW (ref 8.9–10.3)
Chloride: 93 mmol/L — ABNORMAL LOW (ref 98–111)
Creatinine, Ser: 0.63 mg/dL (ref 0.44–1.00)
GFR calc Af Amer: >60 mL/min (ref >60)
GFR calc non Af Amer: >60 mL/min (ref >60)
Glucose, Bld: 114 mg/dL — ABNORMAL HIGH (ref 70–99)
Potassium: 3.7 mmol/L (ref 3.5–5.1)
Sodium: 133 mmol/L — ABNORMAL LOW (ref 135–145)

## 2019-08-06 LAB — URINALYSIS, COMPLETE (UACMP) WITH MICROSCOPIC
Bacteria, UA: NONE SEEN
Bilirubin Urine: NEGATIVE
Glucose, UA: 50 mg/dL — AB
Hgb urine dipstick: NEGATIVE
Ketones, ur: NEGATIVE mg/dL
Nitrite: NEGATIVE
Protein, ur: 30 mg/dL — AB
Specific Gravity, Urine: 1.023 (ref 1.005–1.030)
pH: 5 (ref 5.0–8.0)

## 2019-08-06 LAB — TROPONIN I (HIGH SENSITIVITY)
Troponin I (High Sensitivity): 7 ng/L (ref <18)
Troponin I (High Sensitivity): 6 ng/L (ref <18)

## 2019-08-06 LAB — MAGNESIUM: Magnesium: 1.7 mg/dL (ref 1.7–2.4)

## 2019-08-06 LAB — BRAIN NATRIURETIC PEPTIDE: B Natriuretic Peptide: 144 pg/mL — ABNORMAL HIGH (ref 0.0–100.0)

## 2019-08-06 MED ORDER — SODIUM CHLORIDE 0.9% FLUSH: 3.0000 mL | Freq: Once | INTRAVENOUS | Status: DC

## 2019-08-06 MED ORDER — SUCRALFATE 1 G PO TABS
1.0000 g | ORAL_TABLET | Freq: Three times a day (TID) | ORAL | Status: DC
  Administered 2019-08-06 – 2019-08-09 (×9): 1 g via ORAL
  Filled 2019-08-06 (×9): qty 1

## 2019-08-06 MED ORDER — APIXABAN 5 MG PO TABS
5.0000 mg | ORAL_TABLET | Freq: Two times a day (BID) | ORAL | Status: DC
  Administered 2019-08-06 – 2019-08-09 (×6): 5 mg via ORAL
  Filled 2019-08-06 (×6): qty 1

## 2019-08-06 MED ORDER — ASCORBIC ACID 500 MG PO TABS
500.0000 mg | ORAL_TABLET | Freq: Every day | ORAL | Status: DC
  Administered 2019-08-07 – 2019-08-09 (×3): 500 mg via ORAL
  Filled 2019-08-06 (×3): qty 1

## 2019-08-06 MED ORDER — CLONAZEPAM 0.5 MG PO TABS
0.2500 mg | ORAL_TABLET | Freq: Two times a day (BID) | ORAL | Status: DC | PRN
  Administered 2019-08-07 (×2): 0.25 mg via ORAL
  Filled 2019-08-06 (×2): qty 1

## 2019-08-06 MED ORDER — SODIUM CHLORIDE 0.9 % IV SOLN: 250.0000 mL | INTRAVENOUS | Status: DC | PRN

## 2019-08-06 MED ORDER — SODIUM CHLORIDE 0.9% FLUSH
3.0000 mL | Freq: Two times a day (BID) | INTRAVENOUS | Status: DC
  Administered 2019-08-06 – 2019-08-09 (×7): 3 mL via INTRAVENOUS

## 2019-08-06 MED ORDER — METHYLPREDNISOLONE SODIUM SUCC 125 MG IJ SOLR
125.0000 mg | Freq: Once | INTRAMUSCULAR | Status: AC
  Administered 2019-08-06: 12:00:00 125 mg via INTRAVENOUS
  Filled 2019-08-06: qty 2

## 2019-08-06 MED ORDER — BUSPIRONE HCL 15 MG PO TABS
30.0000 mg | ORAL_TABLET | Freq: Two times a day (BID) | ORAL | Status: DC
  Administered 2019-08-06 – 2019-08-09 (×6): 30 mg via ORAL
  Filled 2019-08-06 (×2): qty 2
  Filled 2019-08-06: qty 6
  Filled 2019-08-06 (×4): qty 2

## 2019-08-06 MED ORDER — DILTIAZEM HCL ER COATED BEADS 180 MG PO CP24
360.0000 mg | ORAL_CAPSULE | Freq: Every day | ORAL | Status: DC
  Administered 2019-08-07 – 2019-08-09 (×3): 360 mg via ORAL
  Filled 2019-08-06 (×4): qty 2

## 2019-08-06 MED ORDER — IPRATROPIUM-ALBUTEROL 0.5-2.5 (3) MG/3ML IN SOLN
3.0000 mL | Freq: Once | RESPIRATORY_TRACT | Status: AC
  Administered 2019-08-06: 12:00:00 3 mL via RESPIRATORY_TRACT

## 2019-08-06 MED ORDER — IPRATROPIUM-ALBUTEROL 0.5-2.5 (3) MG/3ML IN SOLN
3.0000 mL | Freq: Once | RESPIRATORY_TRACT | Status: AC
  Administered 2019-08-06: 3 mL via RESPIRATORY_TRACT

## 2019-08-06 MED ORDER — METOPROLOL TARTRATE 50 MG PO TABS
50.0000 mg | ORAL_TABLET | Freq: Two times a day (BID) | ORAL | Status: DC
  Administered 2019-08-06 – 2019-08-09 (×6): 50 mg via ORAL
  Filled 2019-08-06 (×6): qty 1

## 2019-08-06 MED ORDER — FLUTICASONE-UMECLIDIN-VILANT 100-62.5-25 MCG/INH IN AEPB: 1.0000 | INHALATION_SPRAY | Freq: Every day | RESPIRATORY_TRACT | Status: DC

## 2019-08-06 MED ORDER — IPRATROPIUM-ALBUTEROL 0.5-2.5 (3) MG/3ML IN SOLN
3.0000 mL | Freq: Once | RESPIRATORY_TRACT | Status: AC
  Administered 2019-08-06: 3 mL via RESPIRATORY_TRACT
  Filled 2019-08-06: qty 9

## 2019-08-06 MED ORDER — ESCITALOPRAM OXALATE 10 MG PO TABS
20.0000 mg | ORAL_TABLET | Freq: Every day | ORAL | Status: DC
  Administered 2019-08-07 – 2019-08-09 (×3): 20 mg via ORAL
  Filled 2019-08-06 (×3): qty 2

## 2019-08-06 MED ORDER — IPRATROPIUM-ALBUTEROL 0.5-2.5 (3) MG/3ML IN SOLN: 3.0000 mL | Freq: Four times a day (QID) | RESPIRATORY_TRACT | Status: DC | PRN

## 2019-08-06 MED ORDER — ROPINIROLE HCL 1 MG PO TABS
3.0000 mg | ORAL_TABLET | Freq: Every day | ORAL | Status: DC
  Administered 2019-08-06 – 2019-08-08 (×3): 3 mg via ORAL
  Filled 2019-08-06 (×3): qty 3

## 2019-08-06 MED ORDER — ONDANSETRON HCL 4 MG/2ML IJ SOLN: 4.0000 mg | Freq: Four times a day (QID) | INTRAMUSCULAR | Status: DC | PRN

## 2019-08-06 MED ORDER — FUROSEMIDE 10 MG/ML IJ SOLN
40.0000 mg | Freq: Every day | INTRAMUSCULAR | Status: DC
  Administered 2019-08-07 – 2019-08-08 (×2): 40 mg via INTRAVENOUS
  Filled 2019-08-06 (×2): qty 4

## 2019-08-06 MED ORDER — ACETAMINOPHEN 325 MG PO TABS: 650.0000 mg | ORAL_TABLET | Freq: Four times a day (QID) | ORAL | Status: DC | PRN

## 2019-08-06 MED ORDER — ONDANSETRON HCL 4 MG PO TABS: 4.0000 mg | ORAL_TABLET | Freq: Four times a day (QID) | ORAL | Status: DC | PRN

## 2019-08-06 MED ORDER — SODIUM CHLORIDE 0.9% FLUSH: 3.0000 mL | INTRAVENOUS | Status: DC | PRN

## 2019-08-06 MED ORDER — PANTOPRAZOLE SODIUM 40 MG PO TBEC
40.0000 mg | DELAYED_RELEASE_TABLET | Freq: Every day | ORAL | Status: DC
  Administered 2019-08-07 – 2019-08-09 (×3): 40 mg via ORAL
  Filled 2019-08-06 (×3): qty 1

## 2019-08-06 MED ORDER — MAGNESIUM OXIDE 400 (241.3 MG) MG PO TABS
200.0000 mg | ORAL_TABLET | Freq: Two times a day (BID) | ORAL | Status: DC
  Administered 2019-08-06 – 2019-08-09 (×6): 200 mg via ORAL
  Filled 2019-08-06 (×7): qty 1

## 2019-08-06 MED ORDER — POLYETHYLENE GLYCOL 3350 17 G PO PACK
17.0000 g | PACK | Freq: Every day | ORAL | Status: DC
  Administered 2019-08-07 – 2019-08-09 (×3): 17 g via ORAL
  Filled 2019-08-06 (×3): qty 1

## 2019-08-06 MED ORDER — PRAVASTATIN SODIUM 20 MG PO TABS
20.0000 mg | ORAL_TABLET | Freq: Every day | ORAL | Status: DC
  Administered 2019-08-06 – 2019-08-08 (×3): 20 mg via ORAL
  Filled 2019-08-06 (×3): qty 1

## 2019-08-06 MED ORDER — FUROSEMIDE 10 MG/ML IJ SOLN: 80.0000 mg | Freq: Once | INTRAMUSCULAR | Status: DC

## 2019-08-06 MED ORDER — ACETAMINOPHEN 650 MG RE SUPP: 650.0000 mg | Freq: Four times a day (QID) | RECTAL | Status: DC | PRN

## 2019-08-06 NOTE — ED Provider Notes (Signed)
[facility] Emergency Department Provider Note  ____________________________________________   First MD Initiated Contact with Patient 08/06/19 1154     (approximate)  I have reviewed the triage vital signs and the nursing notes.   HISTORY  Chief Complaint Chest Pain and Weakness    HPI Cathy Watts is a 74 y.o. female with noon AAA, anxiety, COPD on 2 L, lung cancer who comes in with chest pain x2 weeks.  Patient states that she has had worsening leg swelling over the past 2 weeks.  She initially takes Lasix 40 mg and has gone up to 60 mg but still not urinating.  Patient states that her swelling is severe, bilateral, nothing is making it better, nothing makes it worse.  Patient is on Eliquis for her A. fib.  Has not missed any doses.  States that she does feel little short of breath as well. Pt positive for covid 3/15          Past Medical History:  Diagnosis Date  . Abdominal aortic aneurysm (AAA) 3.0 cm to 5.0 cm in diameter in female [facility]) 05/2018   being followed by dr. Delana Meyer. will reultrasound in 1 year  . Anxiety   . Asthma   . Barrett's esophagus   . COPD (chronic obstructive pulmonary disease) ([place])   . Depression   . GERD (gastroesophageal reflux disease)   . History of peptic ulcer disease   . Hypercholesterolemia   . Low oxygen saturation    2l hs  . Malignant neoplasm of upper lobe of left lung ([place]) 04/2019  . Osteopenia   . Panic disorder   . Personal history of radiation therapy   . Personal history of tobacco use, presenting hazards to health 01/16/2015  . Requires supplemental oxygen 05/2018   supposed to use 2L NP at night but she currently does not.  encouraged patient to resume this  . Sleep apnea    snores significantly but has never been tested for OSA  . SOB (shortness of breath)   . 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  Generalized weakness 07/30/2019  . Physical deconditioning 07/30/2019  . Delirium   . Goals of care, counseling/discussion   . Palliative care by specialist   . COPD exacerbation ([place]) 07/19/2019  . COVID-19 07/19/2019  . Acute on chronic respiratory failure with hypoxia ([place])   . Atrial fibrillation with RVR ([place])   . Postobstructive pneumonia 07/09/2019  . Atrial flutter with rapid ventricular response ([place]) 06/17/2019  . Left leg swelling 05/14/2018  . Elevated hemoglobin A1c 05/02/2018  . Abdominal aortic aneurysm (AAA) 3.0 cm to 5.0 cm in diameter in female ([place]) 06/30/2017  . Abdominal aortic atherosclerosis ([place]) 06/30/2017  . Chronic venous insufficiency 04/11/2016  . Varicose veins of bilateral lower extremities with pain 03/12/2016  . Cancer of lower lobe of right lung ([place]) 10/09/2015  . Oropharyngeal candidiasis 07/12/2015  . Malignant neoplasm of hilus of left lung ([place])   . Personal history of tobacco use, presenting hazards to health 01/16/2015  . Airway hyperreactivity 11/15/2014  . CN (constipation) 11/15/2014  . COPD with acute exacerbation ([place]) 11/15/2014  . Daytime somnolence 11/15/2014  . Clinical depression 11/15/2014  . DD (diverticular disease) 11/15/2014  . Accumulation of fluid in tissues 11/15/2014  . H/O peptic ulcer 11/15/2014  . Osteopenia 11/15/2014  . Awareness of heartbeats  11/15/2014  . Episodic paroxysmal anxiety disorder 11/15/2014  . Apnea, sleep 11/15/2014  . Snores 11/15/2014  . Breath shortness 11/15/2014  . Compulsive tobacco user syndrome 11/15/2014  . Avitaminosis D 11/15/2014  . Barrett esophagus 10/19/2014  . Acid reflux 10/19/2014  . COPD, moderate ([place]) 08/31/2013  . Dyspnea 08/31/2013  . Otitis, externa, infective 08/31/2013  . Nocturnal hypoxemia 08/31/2013  . Hypercholesterolemia without hypertriglyceridemia 11/05/2005    Past Surgical History:  Procedure Laterality Date  . CERVICAL CONE BIOPSY  1990  .  CHOLECYSTECTOMY  1980's  . colonoscopy  2014  . COLONOSCOPY  07/21/2012  . COLONOSCOPY WITH PROPOFOL N/A 05/12/2018   Procedure: COLONOSCOPY WITH PROPOFOL;  Surgeon: Lollie Sails, MD;  Location: [facility] ENDOSCOPY;  Service: Endoscopy;  Laterality: N/A;  . ELBOW FRACTURE SURGERY Right 2009  . ENDOBRONCHIAL ULTRASOUND N/A 02/07/2015   Procedure: ENDOBRONCHIAL ULTRASOUND;  Surgeon: Flora Lipps, MD;  Location: [facility] ORS;  Service: Cardiopulmonary;  Laterality: N/A;  . ENDOBRONCHIAL ULTRASOUND Left 06/05/2018   Procedure: ENDOBRONCHIAL ULTRASOUND;  Surgeon: Flora Lipps, MD;  Location: [facility] ORS;  Service: Cardiopulmonary;  Laterality: Left;  . ESOPHAGOGASTRODUODENOSCOPY (EGD) WITH PROPOFOL N/A 02/26/2016   Procedure: ESOPHAGOGASTRODUODENOSCOPY (EGD) WITH PROPOFOL;  Surgeon: Lollie Sails, MD;  Location: [facility] ENDOSCOPY;  Service: Endoscopy;  Laterality: N/A;  COPD, Sleep Apnea  . ESOPHAGOGASTRODUODENOSCOPY (EGD) WITH PROPOFOL N/A 05/12/2018   Procedure: ESOPHAGOGASTRODUODENOSCOPY (EGD) WITH PROPOFOL;  Surgeon: Lollie Sails, MD;  Location: [facility] ENDOSCOPY;  Service: Endoscopy;  Laterality: N/A;  . EYE SURGERY Bilateral    cataract extractions  . FRACTURE SURGERY  2005   elbow repair  . PORTA CATH INSERTION N/A 06/17/2018   Procedure: PORTA CATH INSERTION;  Surgeon: Katha Cabal, MD;  Location: [place] CV LAB;  Service: Cardiovascular;  Laterality: N/A;  . TONSILLECTOMY  1979  . TUBAL LIGATION  1970's  . UPPER GI ENDOSCOPY      Prior to Admission medications   Medication Sig Start Date End Date Taking? Authorizing Provider  acetaminophen (TYLENOL) 325 MG tablet Take 2 tablets (650 mg total) by mouth every 6 (six) hours as needed for mild pain or headache (fever >/= 101). 07/23/19   Val Riles, MD  apixaban (ELIQUIS) 5 MG TABS tablet Take 1 tablet (5 mg total) by mouth 2 (two) times daily. 06/19/19   Ezekiel Slocumb, DO  busPIRone (BUSPAR) 30 MG tablet Take 1 tablet by mouth  twice daily Patient taking differently: Take 30 mg by mouth 2 (two) times daily.  05/17/19   Mar Daring, PA-C  clonazePAM (KLONOPIN) 0.5 MG tablet Take 0.5-1 tablets (0.25-0.5 mg total) by mouth 2 (two) times daily as needed for anxiety. 07/30/19   Mar Daring, PA-C  diltiazem (CARDIZEM CD) 360 MG 24 hr capsule Take 1 capsule (360 mg total) by mouth daily. 07/16/19 08/15/19  Wyvonnia Dusky, MD  escitalopram (LEXAPRO) 20 MG tablet Take 1 tablet by mouth once daily Patient taking differently: Take 20 mg by mouth daily.  05/17/19   Mar Daring, PA-C  Fluticasone-Umeclidin-Vilant (TRELEGY ELLIPTA) 100-62.5-25 MCG/INH AEPB Inhale 1 puff into the lungs daily. 12/28/18   Flora Lipps, MD  furosemide (LASIX) 20 MG tablet Take 1 tablet (20 mg total) by mouth daily. Patient taking differently: Take 20 mg by mouth daily.  01/20/19   Mar Daring, PA-C  ipratropium (ATROVENT HFA) 17 MCG/ACT inhaler Inhale 2 puffs into the lungs 4 (four) times daily. 06/19/19 06/18/20  Ezekiel Slocumb, DO  ipratropium-albuterol (DUONEB) 0.5-2.5 (3) MG/3ML SOLN Take 3 mLs by nebulization every 6 (six) hours as needed. Patient taking differently: Take 3 mLs by nebulization every 6 (six) hours as needed (shortness of breath).  07/02/19   Mar Daring, PA-C  Magnesium 250 MG TABS Take 250 mg by mouth 2 (two) times daily.     [provider]  metoprolol tartrate (LOPRESSOR) 50 MG tablet Take 50 mg by mouth 2 (two) times daily. 06/23/19   [provider]  nystatin (MYCOSTATIN) 100000 UNIT/ML suspension TAKE 5 ML BY MOUTH 4 TIMES DAILY 07/30/19   Mar Daring, PA-C  omeprazole (PRILOSEC) 40 MG capsule Take 1 capsule by mouth once daily 07/28/19   Mar Daring, PA-C  polyethylene glycol powder (GLYCOLAX/MIRALAX) powder TAKE 17 GRAMS OF POWDER MIXED IN 8 OUNCES OF WATER BY MOUTH ONCE A DAY. 05/02/17   Mar Daring, PA-C  potassium chloride SA (KLOR-CON) 20  MEQ tablet Take 1 tablet (20 mEq total) by mouth daily. 07/26/19   Mar Daring, PA-C  pravastatin (PRAVACHOL) 20 MG tablet TAKE 1 TABLET BY MOUTH AT BEDTIME Patient taking differently: Take 20 mg by mouth daily.  06/14/19   Mar Daring, PA-C  PROAIR HFA 108 (90 Base) MCG/ACT inhaler INHALE 2 PUFFS BY MOUTH EVERY 4 HOURS AS NEEDED FOR WHEEZING FOR SHORTNESS OF BREATH Patient taking differently: Inhale 2 puffs into the lungs every 4 (four) hours as needed for wheezing or shortness of breath.  06/15/19   Flora Lipps, MD  Respiratory Therapy Supplies (FLUTTER) DEVI 1 each by Does not apply route QID. 03/31/18   Flora Lipps, MD  rOPINIRole (REQUIP) 3 MG tablet TAKE 1 TABLET BY MOUTH AT BEDTIME 07/24/19   Mar Daring, PA-C  sucralfate (CARAFATE) 1 g tablet Take 1 g by mouth 3 (three) times daily. DISSOLVE IN THREE TO FOUR TABLESPOONFULS OF WARM WARM SWISH AND SWALLOW 06/22/19   [provider]  vitamin C (VITAMIN C) 500 MG tablet Take 1 tablet (500 mg total) by mouth daily for 14 days. 07/23/19 08/06/19  Val Riles, MD    Allergies Amoxicillin and Chlorhexidine  Family History  Problem Relation Age of Onset  . Colon cancer Mother        colon  . Diabetes Mother   . Arthritis Mother   . Glaucoma Mother   . Stomach cancer Father        stomach  . Throat cancer Brother        throat  . Breast cancer Sister        breast  . Diabetes Sister   . Cirrhosis Sister   . Cancer Paternal Aunt   . Parkinson's disease Sister   . Heart attack Sister   . Ovarian cancer Sister     Social History Social History   Tobacco Use  . Smoking status: Former Smoker    Packs/day: 1.00    Years: 55.00    Pack years: 55.00    Types: Cigarettes    Quit date: 11/02/2017    Years since quitting: 1.7  . Smokeless tobacco: Never Used  Substance Use Topics  . Alcohol use: Yes    Alcohol/week: 0.0 standard drinks    Comment: occasionally- wine  . Drug use: No      Review  of Systems Constitutional: No fever/chills Eyes: No visual changes. ENT: No sore throat. Cardiovascular: Positive chest pain Respiratory: Denies shortness of breath. Gastrointestinal: No abdominal pain.  No nausea, no vomiting.  No diarrhea.  No constipation. Genitourinary: Negative for dysuria. Musculoskeletal: Negative for back pain. Leg swelling  Skin: Negative for rash. Neurological: Negative for headaches, focal weakness or numbness. All other ROS negative ____________________________________________   PHYSICAL EXAM:  VITAL SIGNS: ED Triage Vitals  Enc Vitals Group     BP 08/06/19 1135 (!) 93/47     Pulse Rate 08/06/19 1135 (!) 101     Resp 08/06/19 1135 (!) 22     Temp 08/06/19 1135 98.4 F (36.9 C)     Temp src --      SpO2 08/06/19 1135 92 %     Weight 08/06/19 1136 (!) 324 lb (147 kg)     Height 08/06/19 1136 5\' 10"  (1.778 m)     Head Circumference --      Peak Flow --      Pain Score 08/06/19 1136 10     Pain Loc --      Pain Edu? --      Excl. in [place]? --     Constitutional: Alert and oriented.  Eyes: Conjunctivae are normal. EOMI. Head: Atraumatic. Nose: No congestion/rhinnorhea. Mouth/Throat: Mucous membranes are moist.   Neck: No stridor. Trachea Midline. FROM Cardiovascular tachy  regular rhythm. Grossly normal heart sounds.  Good peripheral circulation. Respiratory: increased WOB with wheezing   No retractions.. Gastrointestinal: Soft and nontender. No distention. No abdominal bruits.  Musculoskeletal:  2+ edema  No joint effusions. Neurologic:  Normal speech and language. No gross focal neurologic deficits are appreciated.  Skin:  Skin is warm, dry and intact. No rash noted. Psychiatric: Mood and affect are normal. Speech and behavior are normal. GU: Deferred   ____________________________________________   LABS (all labs ordered are listed, but only abnormal results are displayed)  Labs Reviewed  BASIC METABOLIC PANEL - Abnormal; Notable for  the following components:      Result Value   Sodium 133 (*)    Chloride 93 (*)    Glucose, Bld 114 (*)    Calcium 8.5 (*)    All other components within normal limits  CBC - Abnormal; Notable for the following components:   WBC 11.5 (*)    RBC 3.86 (*)    Hemoglobin 10.8 (*)    HCT 34.6 (*)    RDW 16.5 (*)    All other components within normal limits  BRAIN NATRIURETIC PEPTIDE - Abnormal; Notable for the following components:   B Natriuretic Peptide 144.0 (*)    All other components within normal limits  URINALYSIS, COMPLETE (UACMP) WITH MICROSCOPIC - Abnormal; Notable for the following components:   Color, Urine YELLOW (*)    APPearance HAZY (*)    Glucose, UA 50 (*)    Protein, ur 30 (*)    Leukocytes,Ua TRACE (*)    All other components within normal limits  HEPATIC FUNCTION PANEL - Abnormal; Notable for the following components:   Total Protein 6.4 (*)    Albumin 2.6 (*)    ALT 47 (*)    All other components within normal limits  BASIC METABOLIC PANEL - Abnormal; Notable for the following components:   Sodium 132 (*)    Chloride 95 (*)    Glucose, Bld 133 (*)    Calcium 8.7 (*)    All other components within normal limits  CBC - Abnormal; Notable for the following components:   WBC 13.4 (*)    RBC 3.70 (*)    Hemoglobin 10.3 (*)    HCT 32.6 (*)  RDW 16.0 (*)    All other components within normal limits  CULTURE, BLOOD (ROUTINE X 2)  CULTURE, BLOOD (ROUTINE X 2)  MAGNESIUM  TSH  TROPONIN I (HIGH SENSITIVITY)  TROPONIN I (HIGH SENSITIVITY)   ____________________________________________   ED ECG REPORT I, Vanessa [place], the attending physician, personally viewed and interpreted this ECG.  afib rate of 103, no st elevation, no twi, normal intervals  ____________________________________________  RADIOLOGY Robert Bellow, personally viewed and evaluated these images (plain radiographs) as part of my medical decision making, as well as reviewing the written  report by the radiologist.  ED MD interpretation:  Xray similar known changes with cancer   Official radiology report(s): DG Chest 2 View  Result Date: 08/06/2019 CLINICAL DATA:  Increasing weakness since yesterday EXAM: CHEST - 2 VIEW COMPARISON:  07/19/2019 FINDINGS: Right-sided Port-A-Cath in satisfactory position. Bilateral perihilar opacities, left greater than right, unchanged compared with the prior examination of 07/19/2019. small left pleural effusion. No pneumothorax. Stable heart size. No acute osseous abnormality. IMPRESSION: Stable appearance of the left perihilar airspace opacity consistent with history of known malignancy. Stable posttreatment changes of the right hilum. Electronically Signed   By: Kathreen Devoid   On: 08/06/2019 12:16   US Venous Img Lower Bilateral  Result Date: 08/06/2019 CLINICAL DATA:  74 year old female with a history of lower extremity swelling EXAM: BILATERAL LOWER EXTREMITY VENOUS DOPPLER ULTRASOUND TECHNIQUE: Gray-scale sonography with graded compression, as well as color Doppler and duplex ultrasound were performed to evaluate the lower extremity deep venous systems from the level of the common femoral vein and including the common femoral, femoral, profunda femoral, popliteal and calf veins including the posterior tibial, peroneal and gastrocnemius veins when visible. The superficial great saphenous vein was also interrogated. Spectral Doppler was utilized to evaluate flow at rest and with distal augmentation maneuvers in the common femoral, femoral and popliteal veins. COMPARISON:  None. FINDINGS: RIGHT LOWER EXTREMITY Common Femoral Vein: No evidence of thrombus. Normal compressibility, respiratory phasicity and response to augmentation. Saphenofemoral Junction: No evidence of thrombus. Normal compressibility and flow on color Doppler imaging. Profunda Femoral Vein: No evidence of thrombus. Normal compressibility and flow on color Doppler imaging. Femoral Vein: No  evidence of thrombus. Normal compressibility, respiratory phasicity and response to augmentation. Popliteal Vein: No evidence of thrombus. Normal compressibility, respiratory phasicity and response to augmentation. Calf Veins: No evidence of thrombus. Normal compressibility and flow on color Doppler imaging. Superficial Great Saphenous Vein: No evidence of thrombus. Normal compressibility and flow on color Doppler imaging. Other Findings:  None. LEFT LOWER EXTREMITY Common Femoral Vein: No evidence of thrombus. Normal compressibility, respiratory phasicity and response to augmentation. Saphenofemoral Junction: No evidence of thrombus. Normal compressibility and flow on color Doppler imaging. Profunda Femoral Vein: No evidence of thrombus. Normal compressibility and flow on color Doppler imaging. Femoral Vein: No evidence of thrombus. Normal compressibility, respiratory phasicity and response to augmentation. Popliteal Vein: No evidence of thrombus. Normal compressibility, respiratory phasicity and response to augmentation. Calf Veins: No evidence of thrombus. Normal compressibility and flow on color Doppler imaging. Superficial Great Saphenous Vein: No evidence of thrombus. Normal compressibility and flow on color Doppler imaging. Other Findings:  None. IMPRESSION: Sonographic survey of the bilateral lower extremities negative for DVT Electronically Signed   By: Corrie Mckusick D.O.   On: 08/06/2019 15:52    ____________________________________________   PROCEDURES  Procedure(s) performed (including Critical Care):  Procedures   ____________________________________________   INITIAL IMPRESSION / ASSESSMENT AND  PLAN / ED COURSE   DERIAN PFOST was evaluated in Emergency Department on 08/06/2019 for the symptoms described in the history of present illness. She was evaluated in the context of the global COVID-19 pandemic, which necessitated consideration that the patient might be at risk for  infection with the SARS-CoV-2 virus that causes COVID-19. Institutional protocols and algorithms that pertain to the evaluation of patients at risk for COVID-19 are in a state of rapid change based on information released by regulatory bodies including the CDC and federal and state organizations. These policies and algorithms were followed during the patient's care in the ED.    Most Likely DDx:  -CHF given signficant leg swelling, COPD given the wheezing.   DDx that was also considered d/t potential to cause harm, but was found less likely based on history and physical (as detailed above): -PNA (no fevers, cough but CXR to evaluate) -PNX (reassured with equal b/l breath sounds, CXR to evaluate) -Symptomatic anemia (will get H&H) -Pulmonary embolism as no sob at rest, not pleuritic in nature, no hypoxia  On eliuqis  -Aortic Dissection as no tearing pain and no radiation to the mid back, pulses equal -Pericarditis no rub on exam, EKG changes or hx to suggest dx -Tamponade (no notable SOB, tachycardic, hypotensive) -Esophageal rupture (no h/o diffuse vomitting/no crepitus)  Labs re-assuring. Difficult to diuresis due to hypotension. Pt Given duonebs/steroids for COPD. Slightly improved breathing on baseline o2. D/w family. Pt having hard time getting around due to swelling and week. Will d/w hospital team given hard to increase lasix with hypotension. Will need close diuresis.     ____________________________________________   FINAL CLINICAL IMPRESSION(S) / ED DIAGNOSES   Final diagnoses:  Leg swelling  Malignant neoplasm of lung, unspecified laterality, unspecified part of lung (HCC)  Hypotension, unspecified hypotension type  COPD exacerbation ([place])     MEDICATIONS GIVEN DURING THIS VISIT:  Medications  sodium chloride flush (NS) 0.9 % injection 3 mL (3 mLs Intravenous Refused 08/06/19 1223)  furosemide (LASIX) injection 80 mg (0 mg Intravenous Hold 08/06/19 1540)  diltiazem  (CARDIZEM CD) 24 hr capsule 360 mg (has no administration in time range)  metoprolol tartrate (LOPRESSOR) tablet 50 mg (50 mg Oral Given 08/06/19 2151)  pravastatin (PRAVACHOL) tablet 20 mg (20 mg Oral Given 08/06/19 1856)  busPIRone (BUSPAR) tablet 30 mg (30 mg Oral Given 08/06/19 2139)  escitalopram (LEXAPRO) tablet 20 mg (has no administration in time range)  pantoprazole (PROTONIX) EC tablet 40 mg (has no administration in time range)  polyethylene glycol (MIRALAX / GLYCOLAX) packet 17 g (has no administration in time range)  apixaban (ELIQUIS) tablet 5 mg (5 mg Oral Given 08/06/19 2140)  sucralfate (CARAFATE) tablet 1 g (1 g Oral Given 08/06/19 2140)  clonazePAM (KLONOPIN) tablet 0.25 mg (0.25 mg Oral Given 08/07/19 0511)  rOPINIRole (REQUIP) tablet 3 mg (3 mg Oral Given 08/06/19 2138)  magnesium oxide (MAG-OX) tablet 200 mg (200 mg Oral Given 08/06/19 2141)  ascorbic acid (VITAMIN C) tablet 500 mg (has no administration in time range)  ipratropium-albuterol (DUONEB) 0.5-2.5 (3) MG/3ML nebulizer solution 3 mL (has no administration in time range)  sodium chloride flush (NS) 0.9 % injection 3 mL (3 mLs Intravenous Given 08/07/19 0045)  sodium chloride flush (NS) 0.9 % injection 3 mL (has no administration in time range)  0.9 %  sodium chloride infusion (has no administration in time range)  ondansetron (ZOFRAN) tablet 4 mg (has no administration in time range)  Or  ondansetron (ZOFRAN) injection 4 mg (has no administration in time range)  acetaminophen (TYLENOL) tablet 650 mg (has no administration in time range)    Or  acetaminophen (TYLENOL) suppository 650 mg (has no administration in time range)  furosemide (LASIX) injection 40 mg (has no administration in time range)  MEDLINE mouth rinse (has no administration in time range)  fluticasone furoate-vilanterol (BREO ELLIPTA) 100-25 MCG/INH 1 puff (has no administration in time range)  umeclidinium bromide (INCRUSE ELLIPTA) 62.5 MCG/INH 1 puff (has no  administration in time range)  ipratropium-albuterol (DUONEB) 0.5-2.5 (3) MG/3ML nebulizer solution 3 mL (3 mLs Nebulization Given 08/06/19 1223)  ipratropium-albuterol (DUONEB) 0.5-2.5 (3) MG/3ML nebulizer solution 3 mL (3 mLs Nebulization Given 08/06/19 1223)  ipratropium-albuterol (DUONEB) 0.5-2.5 (3) MG/3ML nebulizer solution 3 mL (3 mLs Nebulization Given 08/06/19 1223)  methylPREDNISolone sodium succinate (SOLU-MEDROL) 125 mg/2 mL injection 125 mg (125 mg Intravenous Given 08/06/19 1222)     ED Discharge Orders    None       Note:  This document was prepared using Dragon voice recognition software and may include unintentional dictation errors.   Vanessa Pierpont, MD 08/07/19 [phone]

## 2019-08-06 NOTE — ED Notes (Signed)
Pt reporting back pain. States last time she was in the hospital lidocaine patches were ordered and that they worked well for her. Provider Robinson notified. Awaiting orders.

## 2019-08-06 NOTE — ED Notes (Signed)
Pt up to bedside toilet using walker. Family remains with pt. Will transport to floor once pt back in bed.

## 2019-08-06 NOTE — ED Notes (Signed)
Pt updated on room assignment. Pt refusing room 101 stating last time she was in that room and it "felt like a dungeon." States she will "wait all night for a different bed." Called floor to request new room assignment if possible. Brandy floor RN states they will try to switch pt to different room if possible.

## 2019-08-06 NOTE — ED Triage Notes (Signed)
Pt comes via [facility] from home with c/o chest pain and weakness that has been ongoing. Pt states weakness since FEb. Pt wears 2L [place] O2 chronically.  Pt states mid sternal chest pain that comes and goes. Pt states increased SOB.

## 2019-08-06 NOTE — Progress Notes (Addendum)
Reviewed chart, labs, imaging studies. Will review repeat echo when available . Had mild reduced lv funciton on echo less than 1 month ago. Would be careful with diuresis as edema does not apear to be due to acute chf based on improved bnp and there does not appear to be evidence of increased pulmonary edema.on cxr. Likely secondary to progressive lung ca. Would continue with careful diuresis with current meds. Follow electrolytes and renal funciton. Follow bnp. Full consult in am .

## 2019-08-06 NOTE — H&P (Addendum)
History and Physical    Cathy Watts ZOX:[id number] DOB: Aug 14, 1945 DOA: 08/06/2019  PCP: Mar Daring, PA-C   Patient coming from: Home  I have personally briefly reviewed patient's old medical records in [place]  Chief Complaint: Shortness of breath  HPI: Cathy Watts is a 74 y.o. female with medical history significant for anxiety, atrial fibrillation, chronic respiratory failure on 2 L of oxygen, COPD and lung cancer who presents to the emergency room for evaluation of a 2-week history of chest pain, worsening shortness of breath and bilateral lower extremity swelling.  Chest pain occurs both at rest and with exertion she denies having any nausea, vomiting, palpitations or diaphoresis. She is on diuretics at home and her dosages have been increased without any improvement in her lower extremity swelling. Per daughter she has gained 10lbs in 2 weeks  She has a cough which is chronic.  She denies having any fever or chills  ED Course: Patient with a history of lung cancer without any treatment who presents to the emergency room for evaluation of lower extremity swelling and worsening shortness of breath.  Imaging shows worsening postobstructive pneumonitis and increase in the size of the lung mass.  She will be admitted for further evaluation. Patient received Solu-Medrol and bronchodilators in the emergency room.  Lasix was ordered but not administered due to relative hypotension Review of Systems: As per HPI otherwise 10 point review of systems negative.    Past Medical History:  Diagnosis Date  . Abdominal aortic aneurysm (AAA) 3.0 cm to 5.0 cm in diameter in female [facility]) 05/2018   being followed by dr. Delana Meyer. will reultrasound in 1 year  . Anxiety   . Asthma   . Barrett's esophagus   . COPD (chronic obstructive pulmonary disease) ([place])   . Depression   . GERD (gastroesophageal reflux disease)   . History of peptic ulcer disease   . Hypercholesterolemia    . Low oxygen saturation    2l hs  . 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 Osteopenia   . Panic disorder   . Personal history of radiation therapy   . Personal history of tobacco use, presenting hazards to health 01/16/2015  . Requires supplemental oxygen 05/2018   supposed to use 2L NP at night but she currently does not.  encouraged patient to resume this  . Sleep apnea    snores significantly but has never been tested for OSA  . SOB (shortness of breath)   . Squamous cell carcinoma of right lung ([place]) 2016   Chemo + Rad tx's    Past Surgical History:  Procedure Laterality Date  . CERVICAL CONE BIOPSY  1990  . CHOLECYSTECTOMY  1980's  . colonoscopy  2014  . COLONOSCOPY  07/21/2012  . COLONOSCOPY WITH PROPOFOL N/A 05/12/2018   Procedure: COLONOSCOPY WITH PROPOFOL;  Surgeon: Lollie Sails, MD;  Location: [facility] ENDOSCOPY;  Service: Endoscopy;  Laterality: N/A;  . ELBOW FRACTURE SURGERY Right 2009  . ENDOBRONCHIAL ULTRASOUND N/A 02/07/2015   Procedure: ENDOBRONCHIAL ULTRASOUND;  Surgeon: Flora Lipps, MD;  Location: [facility] ORS;  Service: Cardiopulmonary;  Laterality: N/A;  . ENDOBRONCHIAL ULTRASOUND Left 06/05/2018   Procedure: ENDOBRONCHIAL ULTRASOUND;  Surgeon: Flora Lipps, MD;  Location: [facility] ORS;  Service: Cardiopulmonary;  Laterality: Left;  . ESOPHAGOGASTRODUODENOSCOPY (EGD) WITH PROPOFOL N/A 02/26/2016   Procedure: ESOPHAGOGASTRODUODENOSCOPY (EGD) WITH PROPOFOL;  Surgeon: Lollie Sails, MD;  Location: [facility] ENDOSCOPY;  Service: Endoscopy;  Laterality:  N/A;  COPD, Sleep Apnea  . ESOPHAGOGASTRODUODENOSCOPY (EGD) WITH PROPOFOL N/A 05/12/2018   Procedure: ESOPHAGOGASTRODUODENOSCOPY (EGD) WITH PROPOFOL;  Surgeon: Lollie Sails, MD;  Location[facility] ENDOSCOPY;  Service: Endoscopy;  Laterality: N/A;  . EYE SURGERY Bilateral    cataract extractions  . FRACTURE SURGERY  2005   elbow repair  . PORTA CATH INSERTION N/A 06/17/2018   Procedure: PORTA CATH  INSERTION;  Surgeon: Katha Cabal, MD;  Location: [facility] CV LAB;  Service: Cardiovascular;  Laterality: N/A;  . TONSILLECTOMY  1979  . TUBAL LIGATION  1970's  . UPPER GI ENDOSCOPY       reports that she quit smoking about 21 months ago. Her smoking use included cigarettes. She has a 55.00 pack-year smoking history. She has never used smokeless tobacco. She reports current alcohol use. She reports that she does not use drugs.  Allergies  Allergen Reactions  . Amoxicillin Hives and Itching    Did it involve swelling of the face/tongue/throat, SOB, or low BP? No Did it involve sudden or severe rash/hives, skin peeling, or any reaction on the inside of your mouth or nose? No Did you need to seek medical attention at a hospital or doctor's office? No When did it last happen?2018 If all above answers are "NO", may proceed with cephalosporin use.     . Chlorhexidine Dermatitis    Patient refused    Family History  Problem Relation Age of Onset  . Colon cancer Mother        colon  . Diabetes Mother   . Arthritis Mother   . Glaucoma Mother   . Stomach cancer Father        stomach  . Throat cancer Brother        throat  . Breast cancer Sister        breast  . Diabetes Sister   . Cirrhosis Sister   . Cancer Paternal Aunt   . Parkinson's disease Sister   . Heart attack Sister   . Ovarian cancer Sister      Prior to Admission medications   Medication Sig Start Date End Date Taking? Authorizing Provider  apixaban (ELIQUIS) 5 MG TABS tablet Take 1 tablet (5 mg total) by mouth 2 (two) times daily. 06/19/19  Yes Nicole Kindred A, DO  busPIRone (BUSPAR) 30 MG tablet Take 1 tablet by mouth twice daily Patient taking differently: Take 30 mg by mouth 2 (two) times daily.  05/17/19  Yes Mar Daring, PA-C  diltiazem (CARDIZEM CD) 360 MG 24 hr capsule Take 1 capsule (360 mg total) by mouth daily. 07/16/19 08/15/19 Yes Wyvonnia Dusky, MD  escitalopram (LEXAPRO)  20 MG tablet Take 1 tablet by mouth once daily Patient taking differently: Take 20 mg by mouth daily.  05/17/19  Yes Mar Daring, PA-C  Fluticasone-Umeclidin-Vilant (TRELEGY ELLIPTA) 100-62.5-25 MCG/INH AEPB Inhale 1 puff into the lungs daily. 12/28/18  Yes Flora Lipps, MD  furosemide (LASIX) 20 MG tablet Take 1 tablet (20 mg total) by mouth daily. Patient taking differently: Take 40 mg by mouth daily.  01/20/19  Yes Mar Daring, PA-C  ipratropium (ATROVENT HFA) 17 MCG/ACT inhaler Inhale 2 puffs into the lungs 4 (four) times daily. 06/19/19 06/18/20 Yes Ezekiel Slocumb, DO  Magnesium 250 MG TABS Take 250 mg by mouth 2 (two) times daily.    Yes [provider]  metoprolol tartrate (LOPRESSOR) 50 MG tablet Take 50 mg by mouth 2 (two) times daily. 06/23/19  Yes [provider]  nystatin (MYCOSTATIN) 100000 UNIT/ML suspension TAKE 5 ML BY MOUTH 4 TIMES DAILY 07/30/19  Yes Mar Daring, PA-C  omeprazole (PRILOSEC) 40 MG capsule Take 1 capsule by mouth once daily 07/28/19  Yes Burnette, Anderson Malta M, PA-C  polyethylene glycol powder (GLYCOLAX/MIRALAX) powder TAKE 17 GRAMS OF POWDER MIXED IN 8 OUNCES OF WATER BY MOUTH ONCE A DAY. 05/02/17  Yes Fenton Malling M, PA-C  potassium chloride SA (KLOR-CON) 20 MEQ tablet Take 1 tablet (20 mEq total) by mouth daily. 07/26/19  Yes Mar Daring, PA-C  pravastatin (PRAVACHOL) 20 MG tablet TAKE 1 TABLET BY MOUTH AT BEDTIME Patient taking differently: Take 20 mg by mouth daily.  06/14/19  Yes Fenton Malling M, PA-C  rOPINIRole (REQUIP) 3 MG tablet TAKE 1 TABLET BY MOUTH AT BEDTIME 07/24/19  Yes Mar Daring, PA-C  sucralfate (CARAFATE) 1 g tablet Take 1 g by mouth 3 (three) times daily. DISSOLVE IN THREE TO FOUR TABLESPOONFULS OF WARM WARM SWISH AND SWALLOW 06/22/19  Yes [provider]  vitamin C (VITAMIN C) 500 MG tablet Take 1 tablet (500 mg total) by mouth daily for 14 days. 07/23/19 08/06/19 Yes Val Riles, MD  acetaminophen (TYLENOL) 325 MG tablet Take 2 tablets (650 mg total) by mouth every 6 (six) hours as needed for mild pain or headache (fever >/= 101). 07/23/19   Val Riles, MD  clonazePAM (KLONOPIN) 0.5 MG tablet Take 0.5-1 tablets (0.25-0.5 mg total) by mouth 2 (two) times daily as needed for anxiety. Patient taking differently: Take 0.25-0.5 mg by mouth daily as needed for anxiety.  07/30/19   Mar Daring, PA-C  ipratropium-albuterol (DUONEB) 0.5-2.5 (3) MG/3ML SOLN Take 3 mLs by nebulization every 6 (six) hours as needed. Patient taking differently: Take 3 mLs by nebulization every 6 (six) hours as needed (shortness of breath).  07/02/19   Mar Daring, PA-C  PROAIR HFA 108 (90 Base) MCG/ACT inhaler INHALE 2 PUFFS BY MOUTH EVERY 4 HOURS AS NEEDED FOR WHEEZING FOR SHORTNESS OF BREATH Patient taking differently: Inhale 2 puffs into the lungs every 4 (four) hours as needed for wheezing or shortness of breath.  06/15/19   Flora Lipps, MD  Respiratory Therapy Supplies (FLUTTER) DEVI 1 each by Does not apply route QID. 03/31/18   Flora Lipps, MD    Physical Exam: Vitals:   08/06/19 1245 08/06/19 1300 08/06/19 1315 08/06/19 1352  BP:  (!) 111/91  (!) 102/59  Pulse: (!) 101  (!) 105 70  Resp:    (!) 23  Temp:      SpO2: 95%  96% 95%  Weight:      Height:         Vitals:   08/06/19 1245 08/06/19 1300 08/06/19 1315 08/06/19 1352  BP:  (!) 111/91  (!) 102/59  Pulse: (!) 101  (!) 105 70  Resp:    (!) 23  Temp:      SpO2: 95%  96% 95%  Weight:      Height:        Constitutional: NAD, alert and oriented x 3 Eyes: PERRL, lids and conjunctivae normal ENMT: Mucous membranes are moist.  Neck: normal, supple, no masses, no thyromegaly Respiratory: scattered rhonchi, scattered wheezing, no crackles. Normal respiratory effort. No accessory muscle use.  Cardiovascular: Irregularly regular, no murmurs / rubs / gallops. 2+ extremity edema. 2+ pedal pulses. No carotid  bruits.  Abdomen: no tenderness, no masses palpated. No hepatosplenomegaly. Bowel sounds positive.  Musculoskeletal: no clubbing / cyanosis. No joint deformity upper and lower extremities.  Skin: no rashes, lesions, ulcers.  Neurologic: No gross focal neurologic deficit. Psychiatric: Normal mood and affect.   Labs on Admission: I have personally reviewed following labs and imaging studies  CBC: Recent Labs  Lab 08/06/19 1139  WBC 11.5*  HGB 10.8*  HCT 34.6*  MCV 89.6  PLT 409   Basic Metabolic Panel: Recent Labs  Lab 08/06/19 1139  NA 133*  K 3.7  CL 93*  CO2 30  GLUCOSE 114*  BUN 15  CREATININE 0.63  CALCIUM 8.5*  MG 1.7   GFR: Estimated Creatinine Clearance: 98.8 mL/min (by C-G formula based on SCr of 0.63 mg/dL). Liver Function Tests: Recent Labs  Lab 08/06/19 1139  AST 24  ALT 47*  ALKPHOS 67  BILITOT 0.7  PROT 6.4*  ALBUMIN 2.6*   No results for input(s): LIPASE, AMYLASE in the last 168 hours. No results for input(s): AMMONIA in the last 168 hours. Coagulation Profile: No results for input(s): INR, PROTIME in the last 168 hours. Cardiac Enzymes: No results for input(s): CKTOTAL, CKMB, CKMBINDEX, TROPONINI in the last 168 hours. BNP (last 3 results) No results for input(s): PROBNP in the last 8760 hours. HbA1C: No results for input(s): HGBA1C in the last 72 hours. CBG: No results for input(s): GLUCAP in the last 168 hours. Lipid Profile: No results for input(s): CHOL, HDL, LDLCALC, TRIG, CHOLHDL, LDLDIRECT in the last 72 hours. Thyroid Function Tests: No results for input(s): TSH, T4TOTAL, FREET4, T3FREE, THYROIDAB in the last 72 hours. Anemia Panel: No results for input(s): VITAMINB12, FOLATE, FERRITIN, TIBC, IRON, RETICCTPCT in the last 72 hours. Urine analysis:    Component Value Date/Time   COLORURINE YELLOW (A) 06/28/2019 1655   APPEARANCEUR CLEAR (A) 06/28/2019 1655   LABSPEC 1.014 06/28/2019 1655   PHURINE 5.0 06/28/2019 1655    GLUCOSEU NEGATIVE 06/28/2019 1655   HGBUR SMALL (A) 06/28/2019 1655   BILIRUBINUR NEGATIVE 06/28/2019 1655   KETONESUR NEGATIVE 06/28/2019 1655   PROTEINUR NEGATIVE 06/28/2019 1655   NITRITE NEGATIVE 06/28/2019 1655   LEUKOCYTESUR NEGATIVE 06/28/2019 1655    Radiological Exams on Admission: DG Chest 2 View  Result Date: 08/06/2019 CLINICAL DATA:  Increasing weakness since yesterday EXAM: CHEST - 2 VIEW COMPARISON:  07/19/2019 FINDINGS: Right-sided Port-A-Cath in satisfactory position. Bilateral perihilar opacities, left greater than right, unchanged compared with the prior examination of 07/19/2019. small left pleural effusion. No pneumothorax. Stable heart size. No acute osseous abnormality. IMPRESSION: Stable appearance of the left perihilar airspace opacity consistent with history of known malignancy. Stable posttreatment changes of the right hilum. Electronically Signed   By: Kathreen Devoid   On: 08/06/2019 12:16   CT Chest W Contrast  Result Date: 08/05/2019 CLINICAL DATA:  Restaging lung cancer. EXAM: CT CHEST WITH CONTRAST TECHNIQUE: Multidetector CT imaging of the chest was performed during intravenous contrast administration. CONTRAST:  57mL OMNIPAQUE IOHEXOL 300 MG/ML  SOLN COMPARISON:  CT chest 04/12/19 FINDINGS: Cardiovascular: The heart size appears within normal limits. Aortic atherosclerosis. Three vessel coronary artery calcifications. No pericardial effusion. Mediastinum/Nodes: Normal appearance of the thyroid gland. The trachea appears patent and is midline. Normal appearance of the esophagus. -new left supraclavicular adenopathy is noted measuring up to 1.1 cm, image 16/2. -left pre-vascular lymph node measures 1.3 cm, image 53/2. Previously 0.4 cm. -right paratracheal lymph node measures 1.3 cm, image 58/2. Previously 0.4 cm. Lungs/Pleura: Left lung perihilar mass has increased in size from previous exam. This  measures 6.9 x 6.6 cm, image 68/2. Previously 5.2 x 3.6 cm. Progressive  invasion of the left hilum and mediastinum is identified with narrowing of the right mainstem bronchus, image 65/2 progressive postobstructive pneumonitis within the left upper lobe and left lower lobe noted. Small left pleural effusion is also new from previous exam. Innumerable small pulmonary nodules are noted throughout the left lung which are nonspecific. Some of the small nodules have a halo of ground-glass attenuation. Findings may be related to postobstructive changes. Metastatic disease is not excluded. Post treatment changes involving the right hilar region are stable. There are advanced changes of centrilobular and paraseptal emphysema identified within the right lung. Stable posterior right upper lobe lung nodule measuring 3 mm. New nodule within the posterolateral right upper lobe is identified measuring 5 mm, image 58/3. Upper Abdomen: Unchanged 8 mm dome of liver hypodensity. Left adrenal gland hyperplasia. Unchanged. Musculoskeletal: Superior endplate compression deformity involving the T7 vertebra is stable. No acute bone abnormality. IMPRESSION: 1. Interval progression of disease. 2. Increase in size of left perihilar mass with progressive invasion of the left hilum and mediastinum. 3. New left supraclavicular and right paratracheal adenopathy. 4. New small left pleural effusion. 5. Innumerable small nodules throughout the left lung are nonspecific in may be related to postobstructive changes. Metastatic disease not excluded. 6. Similar appearance of post treatment changes within the perihilar right lung. 7. A small nodule within the posterolateral right upper lobe is identified, new from previous exam. Cannot rule out metastatic disease. 8. Emphysema and aortic atherosclerosis. 9. Three vessel coronary artery calcifications. Aortic Atherosclerosis (ICD10-I70.0) and Emphysema (ICD10-J43.9). Electronically Signed   By: Kerby Moors M.D.   On: 08/05/2019 15:19   US Venous Img Lower  Bilateral  Result Date: 08/06/2019 CLINICAL DATA:  74 year old female with a history of lower extremity swelling EXAM: BILATERAL LOWER EXTREMITY VENOUS DOPPLER ULTRASOUND TECHNIQUE: Gray-scale sonography with graded compression, as well as color Doppler and duplex ultrasound were performed to evaluate the lower extremity deep venous systems from the level of the common femoral vein and including the common femoral, femoral, profunda femoral, popliteal and calf veins including the posterior tibial, peroneal and gastrocnemius veins when visible. The superficial great saphenous vein was also interrogated. Spectral Doppler was utilized to evaluate flow at rest and with distal augmentation maneuvers in the common femoral, femoral and popliteal veins. COMPARISON:  None. FINDINGS: RIGHT LOWER EXTREMITY Common Femoral Vein: No evidence of thrombus. Normal compressibility, respiratory phasicity and response to augmentation. Saphenofemoral Junction: No evidence of thrombus. Normal compressibility and flow on color Doppler imaging. Profunda Femoral Vein: No evidence of thrombus. Normal compressibility and flow on color Doppler imaging. Femoral Vein: No evidence of thrombus. Normal compressibility, respiratory phasicity and response to augmentation. Popliteal Vein: No evidence of thrombus. Normal compressibility, respiratory phasicity and response to augmentation. Calf Veins: No evidence of thrombus. Normal compressibility and flow on color Doppler imaging. Superficial Great Saphenous Vein: No evidence of thrombus. Normal compressibility and flow on color Doppler imaging. Other Findings:  None. LEFT LOWER EXTREMITY Common Femoral Vein: No evidence of thrombus. Normal compressibility, respiratory phasicity and response to augmentation. Saphenofemoral Junction: No evidence of thrombus. Normal compressibility and flow on color Doppler imaging. Profunda Femoral Vein: No evidence of thrombus. Normal compressibility and flow on  color Doppler imaging. Femoral Vein: No evidence of thrombus. Normal compressibility, respiratory phasicity and response to augmentation. Popliteal Vein: No evidence of thrombus. Normal compressibility, respiratory phasicity and response to augmentation. Calf Veins: No evidence of  thrombus. Normal compressibility and flow on color Doppler imaging. Superficial Great Saphenous Vein: No evidence of thrombus. Normal compressibility and flow on color Doppler imaging. Other Findings:  None. IMPRESSION: Sonographic survey of the bilateral lower extremities negative for DVT Electronically Signed   By: Corrie Mckusick D.O.   On: 08/06/2019 15:52    EKG: Independently reviewed.  Atrial fibrillation  Assessment/Plan Principal Problem:   Lung cancer (HCC) Active Problems:   COPD, moderate (HCC)   Depression   Atrial fibrillation with RVR (HCC)   Chronic respiratory failure with hypoxia (HCC)   Lung cancer, main bronchus (HCC)   Acute CHF (congestive heart failure) ([place])     Lung cancer  Patient presents for evaluation of worsening shortness of breath She had a CT scan of the chest done which showed interval progression of disease with increase in size of left perihilar mass with progressive invasion of the left hilum and mediastinum.New left supraclavicular and right paratracheal adenopathy. New small left pleural effusion. Innumerable small nodules throughout the left lung arenonspecific in may be related to postobstructive changes. Metastatic disease not excluded. Emphysema and aortic atherosclerosis. Three vessel coronary artery calcifications. Symptoms may likely be secondary to worsening lung cancer, unlikely to be secondary to congestive heart failure Will request oncology consult    COPD with chronic respiratory failure Continue oxygen supplementation at 2 L   Atrial fibrillation Continue Eliquis as primary prophylaxis for an acute stroke Rate control with  diltiazem   Emphysema Continue as needed bronchodilator therapy and inhaled steroids   Depression Continue Lexapro and BuSpar   Congestive heart failure Etiology unclear Patient has a 10lb weight gain in about 2 weeks Will obtain 2D echocardiogram to assess LVEF Unable to diurese patient due to relative hypotension Will request cardiology consult   DVT prophylaxis: Eliquis Code Status: Full Family Communication: Plan of care was discussed with patient and her daughter at the bedside.  They verbalize understanding and agree with the plan of care.  CODE STATUS was discussed with patient and her daughter and she wants to be a full code Disposition Plan: Back to previous home environment Consults called: Cardiology, pulmonology, Oncology    Collier Bullock MD [facility]     08/06/2019, 5:10 PM

## 2019-08-06 NOTE — ED Triage Notes (Signed)
FIRST NURSE NOTE- daughter told EMS pt has had increasing weakness since yesterday.  Has had increased swelling BLE. Chest pain X 2 weeks.  2 L [place] baseline

## 2019-08-06 NOTE — ED Notes (Signed)
Received report from [place], Therapist, sports.

## 2019-08-06 NOTE — ED Notes (Signed)
Pt switched from stretcher to [place] bed. Urine sample sent to lab. Bed locked low. Rail up. Call bell within reach. Pt and family member at bedside given snack/meal tray and drink.

## 2019-08-06 NOTE — ED Notes (Addendum)
Pt reminded urine sample needed. States will notify this RN once able to give sample. Pt ate 75% of dinner tray per family.

## 2019-08-06 NOTE — Consult Note (Addendum)
Cardiology Consultation Note    Patient ID: Cathy Watts, MRN: [id number], DOB/AGE: 1945-07-26 74 y.o. Admit date: 08/06/2019   Date of Consult: 08/06/2019 Primary Physician: Mar Daring, PA-C Primary Cardiologist: Dr. Nehemiah Massed  Chief Complaint: chf Reason for Consultation: Duanne Limerick Requesting MD: Dr. Francine Graven  HPI: Cathy Watts is a 74 y.o. female with history of afib, chronic respiratory faiure on 2 liters of oxygen, lung carcinoma admitted with several seek istory of increasing sob and lower extremety edema. She had tried diuretics at home with no improvement in her edema. CXR revealed posobstructive pneumonitis and increase in her lung mass. BNP is minimally elevated and improved from the past several weeks. Ruled out for an mi with normal troponin. Renal function normal.EKG afib with mildly increased vr at 103. LE ultrasound negative for dvt. Chest ct showed small left pleural effusion with progressive invasion of her perihilar mass into the left hilum and mediastinum. Echo pending from today. Echo done last month revealed ef of 40-45% with Mild mr and tr. she states her lower extremity edema did not really reduce during last admission.  It persisted after outpatient attempts.  She is currently on IV Lasix.  She is even with I's and O's per chart.  Past Medical History:  Diagnosis Date  . Abdominal aortic aneurysm (AAA) 3.0 cm to 5.0 cm in diameter in female [facility]) 05/2018   being followed by dr. Delana Meyer. will reultrasound in 1 year  . Anxiety   . Asthma   . Barrett's esophagus   . COPD (chronic obstructive pulmonary disease) ([place])   . Depression   . GERD (gastroesophageal reflux disease)   . History of peptic ulcer disease   . Hypercholesterolemia   . Low oxygen saturation    2l hs  . Malignant neoplasm of upper lobe of left lung ([place]) 04/2019  . Osteopenia   . Panic disorder   . Personal history of radiation therapy   . Personal history of tobacco use, presenting  hazards to health 01/16/2015  . Requires supplemental oxygen 05/2018   supposed to use 2L NP at night but she currently does not.  encouraged patient to resume this  . Sleep apnea    snores significantly but has never been tested for OSA  . SOB (shortness of breath)   . Squamous cell carcinoma of right lung ([place]) 2016   Chemo + Rad tx's      Surgical History:  Past Surgical History:  Procedure Laterality Date  . CERVICAL CONE BIOPSY  1990  . CHOLECYSTECTOMY  1980's  . colonoscopy  2014  . COLONOSCOPY  07/21/2012  . COLONOSCOPY WITH PROPOFOL N/A 05/12/2018   Procedure: COLONOSCOPY WITH PROPOFOL;  Surgeon: Lollie Sails, MD;  Location: [facility] ENDOSCOPY;  Service: Endoscopy;  Laterality: N/A;  . ELBOW FRACTURE SURGERY Right 2009  . ENDOBRONCHIAL ULTRASOUND N/A 02/07/2015   Procedure: ENDOBRONCHIAL ULTRASOUND;  Surgeon: Flora Lipps, MD;  Location: [facility] ORS;  Service: Cardiopulmonary;  Laterality: N/A;  . ENDOBRONCHIAL ULTRASOUND Left 06/05/2018   Procedure: ENDOBRONCHIAL ULTRASOUND;  Surgeon: Flora Lipps, MD;  Location: [facility] ORS;  Service: Cardiopulmonary;  Laterality: Left;  . ESOPHAGOGASTRODUODENOSCOPY (EGD) WITH PROPOFOL N/A 02/26/2016   Procedure: ESOPHAGOGASTRODUODENOSCOPY (EGD) WITH PROPOFOL;  Surgeon: Lollie Sails, MD;  Location: [facility] ENDOSCOPY;  Service: Endoscopy;  Laterality: N/A;  COPD, Sleep Apnea  . ESOPHAGOGASTRODUODENOSCOPY (EGD) WITH PROPOFOL N/A 05/12/2018   Procedure: ESOPHAGOGASTRODUODENOSCOPY (EGD) WITH PROPOFOL;  Surgeon: Lollie Sails, MD;  Location: [facility] ENDOSCOPY;  Service:  Endoscopy;  Laterality: N/A;  . EYE SURGERY Bilateral    cataract extractions  . FRACTURE SURGERY  2005   elbow repair  . PORTA CATH INSERTION N/A 06/17/2018   Procedure: PORTA CATH INSERTION;  Surgeon: Katha Cabal, MD;  Location: [place] CV LAB;  Service: Cardiovascular;  Laterality: N/A;  . TONSILLECTOMY  1979  . TUBAL LIGATION  1970's  . UPPER GI ENDOSCOPY       Home  Meds: Prior to Admission medications   Medication Sig Start Date End Date Taking? Authorizing Provider  apixaban (ELIQUIS) 5 MG TABS tablet Take 1 tablet (5 mg total) by mouth 2 (two) times daily. 06/19/19  Yes Nicole Kindred A, DO  busPIRone (BUSPAR) 30 MG tablet Take 1 tablet by mouth twice daily Patient taking differently: Take 30 mg by mouth 2 (two) times daily.  05/17/19  Yes Mar Daring, PA-C  diltiazem (CARDIZEM CD) 360 MG 24 hr capsule Take 1 capsule (360 mg total) by mouth daily. 07/16/19 08/15/19 Yes Wyvonnia Dusky, MD  escitalopram (LEXAPRO) 20 MG tablet Take 1 tablet by mouth once daily Patient taking differently: Take 20 mg by mouth daily.  05/17/19  Yes Mar Daring, PA-C  Fluticasone-Umeclidin-Vilant (TRELEGY ELLIPTA) 100-62.5-25 MCG/INH AEPB Inhale 1 puff into the lungs daily. 12/28/18  Yes Flora Lipps, MD  furosemide (LASIX) 20 MG tablet Take 1 tablet (20 mg total) by mouth daily. Patient taking differently: Take 40 mg by mouth daily.  01/20/19  Yes Mar Daring, PA-C  ipratropium (ATROVENT HFA) 17 MCG/ACT inhaler Inhale 2 puffs into the lungs 4 (four) times daily. 06/19/19 06/18/20 Yes Ezekiel Slocumb, DO  Magnesium 250 MG TABS Take 250 mg by mouth 2 (two) times daily.    Yes [provider]  metoprolol tartrate (LOPRESSOR) 50 MG tablet Take 50 mg by mouth 2 (two) times daily. 06/23/19  Yes [provider]  nystatin (MYCOSTATIN) 100000 UNIT/ML suspension TAKE 5 ML BY MOUTH 4 TIMES DAILY 07/30/19  Yes Mar Daring, PA-C  omeprazole (PRILOSEC) 40 MG capsule Take 1 capsule by mouth once daily 07/28/19  Yes Burnette, Anderson Malta M, PA-C  polyethylene glycol powder (GLYCOLAX/MIRALAX) powder TAKE 17 GRAMS OF POWDER MIXED IN 8 OUNCES OF WATER BY MOUTH ONCE A DAY. 05/02/17  Yes Fenton Malling M, PA-C  potassium chloride SA (KLOR-CON) 20 MEQ tablet Take 1 tablet (20 mEq total) by mouth daily. 07/26/19  Yes Mar Daring, PA-C   pravastatin (PRAVACHOL) 20 MG tablet TAKE 1 TABLET BY MOUTH AT BEDTIME Patient taking differently: Take 20 mg by mouth daily.  06/14/19  Yes Fenton Malling M, PA-C  rOPINIRole (REQUIP) 3 MG tablet TAKE 1 TABLET BY MOUTH AT BEDTIME 07/24/19  Yes Mar Daring, PA-C  sucralfate (CARAFATE) 1 g tablet Take 1 g by mouth 3 (three) times daily. DISSOLVE IN THREE TO FOUR TABLESPOONFULS OF WARM WARM SWISH AND SWALLOW 06/22/19  Yes [provider]  vitamin C (VITAMIN C) 500 MG tablet Take 1 tablet (500 mg total) by mouth daily for 14 days. 07/23/19 08/06/19 Yes Val Riles, MD  acetaminophen (TYLENOL) 325 MG tablet Take 2 tablets (650 mg total) by mouth every 6 (six) hours as needed for mild pain or headache (fever >/= 101). 07/23/19   Val Riles, MD  clonazePAM (KLONOPIN) 0.5 MG tablet Take 0.5-1 tablets (0.25-0.5 mg total) by mouth 2 (two) times daily as needed for anxiety. Patient taking differently: Take 0.25-0.5 mg by mouth daily as needed for  anxiety.  07/30/19   Mar Daring, PA-C  ipratropium-albuterol (DUONEB) 0.5-2.5 (3) MG/3ML SOLN Take 3 mLs by nebulization every 6 (six) hours as needed. Patient taking differently: Take 3 mLs by nebulization every 6 (six) hours as needed (shortness of breath).  07/02/19   Mar Daring, PA-C  PROAIR HFA 108 (90 Base) MCG/ACT inhaler INHALE 2 PUFFS BY MOUTH EVERY 4 HOURS AS NEEDED FOR WHEEZING FOR SHORTNESS OF BREATH Patient taking differently: Inhale 2 puffs into the lungs every 4 (four) hours as needed for wheezing or shortness of breath.  06/15/19   Flora Lipps, MD  Respiratory Therapy Supplies (FLUTTER) DEVI 1 each by Does not apply route QID. 03/31/18   Flora Lipps, MD    Inpatient Medications:  . apixaban  5 mg Oral BID  . [START ON 08/07/2019] ascorbic acid  500 mg Oral Daily  . busPIRone  30 mg Oral BID  . [START ON 08/07/2019] diltiazem  360 mg Oral Daily  . [START ON 08/07/2019] escitalopram  20 mg Oral Daily  .  Fluticasone-Umeclidin-Vilant  1 puff Inhalation Daily  . [START ON 08/07/2019] furosemide  40 mg Intravenous Daily  . furosemide  80 mg Intravenous Once  . magnesium oxide  200 mg Oral BID  . metoprolol tartrate  50 mg Oral BID  . [START ON 08/07/2019] pantoprazole  40 mg Oral Daily  . [START ON 08/07/2019] polyethylene glycol  17 g Oral Daily  . pravastatin  20 mg Oral Daily  . rOPINIRole  3 mg Oral QHS  . sodium chloride flush  3 mL Intravenous Once  . sodium chloride flush  3 mL Intravenous Q12H  . sucralfate  1 g Oral TID   . sodium chloride      Allergies:  Allergies  Allergen Reactions  . Amoxicillin Hives and Itching    Did it involve swelling of the face/tongue/throat, SOB, or low BP? No Did it involve sudden or severe rash/hives, skin peeling, or any reaction on the inside of your mouth or nose? No Did you need to seek medical attention at a hospital or doctor's office? No When did it last happen?2018 If all above answers are "NO", may proceed with cephalosporin use.     . Chlorhexidine Dermatitis    Patient refused    Social History   Socioeconomic History  . Marital status: Widowed    Spouse name: Not on file  . Number of children: 3  . Years of education: Not on file  . Highest education level: 12th grade  Occupational History  . Occupation: Agricultural consultant    Comment: retired  Tobacco Use  . Smoking status: Former Smoker    Packs/day: 1.00    Years: 55.00    Pack years: 55.00    Types: Cigarettes    Quit date: 11/02/2017    Years since quitting: 1.7  . Smokeless tobacco: Never Used  Substance and Sexual Activity  . Alcohol use: Yes    Alcohol/week: 0.0 standard drinks    Comment: occasionally- wine  . Drug use: No  . Sexual activity: Not Currently  Other Topics Concern  . Not on file  Social History Narrative   Grandson lives with patient. Is available to help her as needed.   Social Determinants of Health   Financial  Resource Strain:   . Difficulty of Paying Living Expenses:   Food Insecurity:   . Worried About Charity fundraiser in the Last Year:   . Ran  Out of Food in the Last Year:   Transportation Needs:   . Lack of Transportation (Medical):   Marland Kitchen Lack of Transportation (Non-Medical):   Physical Activity: Inactive  . Days of Exercise per Week: 0 days  . Minutes of Exercise per Session: 0 min  Stress:   . Feeling of Stress :   Social Connections: Unknown  . Frequency of Communication with Friends and Family: Patient refused  . Frequency of Social Gatherings with Friends and Family: Patient refused  . Attends Religious Services: Patient refused  . Active Member of Clubs or Organizations: Patient refused  . Attends Archivist Meetings: Patient refused  . Marital Status: Patient refused  Intimate Partner Violence: Unknown  . Fear of Current or Ex-Partner: Patient refused  . Emotionally Abused: Patient refused  . Physically Abused: Patient refused  . Sexually Abused: Patient refused     Family History  Problem Relation Age of Onset  . Colon cancer Mother        colon  . Diabetes Mother   . Arthritis Mother   . Glaucoma Mother   . Stomach cancer Father        stomach  . Throat cancer Brother        throat  . Breast cancer Sister        breast  . Diabetes Sister   . Cirrhosis Sister   . Cancer Paternal Aunt   . Parkinson's disease Sister   . Heart attack Sister   . Ovarian cancer Sister      Review of Systems: A 12-system review of systems was performed and is negative except as noted in the HPI.  Labs: No results for input(s): CKTOTAL, CKMB, TROPONINI in the last 72 hours. Lab Results  Component Value Date   WBC 11.5 (H) 08/06/2019   HGB 10.8 (L) 08/06/2019   HCT 34.6 (L) 08/06/2019   MCV 89.6 08/06/2019   PLT 236 08/06/2019    Recent Labs  Lab 08/06/19 1139  NA 133*  K 3.7  CL 93*  CO2 30  BUN 15  CREATININE 0.63  CALCIUM 8.5*  PROT 6.4*  BILITOT 0.7   ALKPHOS 67  ALT 47*  AST 24  GLUCOSE 114*   Lab Results  Component Value Date   CHOL 220 (H) 05/01/2018   HDL 59 05/01/2018   LDLCALC 138 (H) 05/01/2018   TRIG 116 05/01/2018   No results found for: DDIMER  Radiology/Studies:  DG Chest 2 View  Result Date: 08/06/2019 CLINICAL DATA:  Increasing weakness since yesterday EXAM: CHEST - 2 VIEW COMPARISON:  07/19/2019 FINDINGS: Right-sided Port-A-Cath in satisfactory position. Bilateral perihilar opacities, left greater than right, unchanged compared with the prior examination of 07/19/2019. small left pleural effusion. No pneumothorax. Stable heart size. No acute osseous abnormality. IMPRESSION: Stable appearance of the left perihilar airspace opacity consistent with history of known malignancy. Stable posttreatment changes of the right hilum. Electronically Signed   By: Kathreen Devoid   On: 08/06/2019 12:16   DG Chest 2 View  Result Date: 07/09/2019 CLINICAL DATA:  Shortness of breath EXAM: CHEST - 2 VIEW COMPARISON:  06/28/2019 FINDINGS: Perihilar airspace opacities are again noted. These have not substantially changed since the prior study. No pneumothorax. No large pleural effusion. The Port-A-Cath is stable in positioning. Heart size is stable. Aortic calcifications are noted. IMPRESSION: 1. No acute cardiopulmonary process. 2. Stable appearance of the lungs as detailed above. Electronically Signed   By: Jamie Kato.D.  On: 07/09/2019 19:50   CT Chest W Contrast  Result Date: 08/05/2019 CLINICAL DATA:  Restaging lung cancer. EXAM: CT CHEST WITH CONTRAST TECHNIQUE: Multidetector CT imaging of the chest was performed during intravenous contrast administration. CONTRAST:  46mL OMNIPAQUE IOHEXOL 300 MG/ML  SOLN COMPARISON:  CT chest 04/12/19 FINDINGS: Cardiovascular: The heart size appears within normal limits. Aortic atherosclerosis. Three vessel coronary artery calcifications. No pericardial effusion. Mediastinum/Nodes: Normal appearance  of the thyroid gland. The trachea appears patent and is midline. Normal appearance of the esophagus. -new left supraclavicular adenopathy is noted measuring up to 1.1 cm, image 16/2. -left pre-vascular lymph node measures 1.3 cm, image 53/2. Previously 0.4 cm. -right paratracheal lymph node measures 1.3 cm, image 58/2. Previously 0.4 cm. Lungs/Pleura: Left lung perihilar mass has increased in size from previous exam. This measures 6.9 x 6.6 cm, image 68/2. Previously 5.2 x 3.6 cm. Progressive invasion of the left hilum and mediastinum is identified with narrowing of the right mainstem bronchus, image 65/2 progressive postobstructive pneumonitis within the left upper lobe and left lower lobe noted. Small left pleural effusion is also new from previous exam. Innumerable small pulmonary nodules are noted throughout the left lung which are nonspecific. Some of the small nodules have a halo of ground-glass attenuation. Findings may be related to postobstructive changes. Metastatic disease is not excluded. Post treatment changes involving the right hilar region are stable. There are advanced changes of centrilobular and paraseptal emphysema identified within the right lung. Stable posterior right upper lobe lung nodule measuring 3 mm. New nodule within the posterolateral right upper lobe is identified measuring 5 mm, image 58/3. Upper Abdomen: Unchanged 8 mm dome of liver hypodensity. Left adrenal gland hyperplasia. Unchanged. Musculoskeletal: Superior endplate compression deformity involving the T7 vertebra is stable. No acute bone abnormality. IMPRESSION: 1. Interval progression of disease. 2. Increase in size of left perihilar mass with progressive invasion of the left hilum and mediastinum. 3. New left supraclavicular and right paratracheal adenopathy. 4. New small left pleural effusion. 5. Innumerable small nodules throughout the left lung are nonspecific in may be related to postobstructive changes. Metastatic  disease not excluded. 6. Similar appearance of post treatment changes within the perihilar right lung. 7. A small nodule within the posterolateral right upper lobe is identified, new from previous exam. Cannot rule out metastatic disease. 8. Emphysema and aortic atherosclerosis. 9. Three vessel coronary artery calcifications. Aortic Atherosclerosis (ICD10-I70.0) and Emphysema (ICD10-J43.9). Electronically Signed   By: Kerby Moors M.D.   On: 08/05/2019 15:19   CT Angio Chest PE W and/or Wo Contrast  Result Date: 07/09/2019 CLINICAL DATA:  Shortness of breath x2 months. EXAM: CT ANGIOGRAPHY CHEST WITH CONTRAST TECHNIQUE: Multidetector CT imaging of the chest was performed using the standard protocol during bolus administration of intravenous contrast. Multiplanar CT image reconstructions and MIPs were obtained to evaluate the vascular anatomy. CONTRAST:  20mL OMNIPAQUE IOHEXOL 350 MG/ML SOLN COMPARISON:  April 12, 2019 FINDINGS: Cardiovascular: Contrast injection is sufficient to demonstrate satisfactory opacification of the pulmonary arteries to the segmental level. There is no pulmonary embolus. The main pulmonary artery is within normal limits for size. There is no CT evidence of acute right heart strain. There are atherosclerotic changes of the visualized thoracic aorta without evidence for an aneurysm. Heart size is normal. Coronary artery calcifications are noted. Mediastinum/Nodes: --there is new mediastinal adenopathy. For example there is a pretracheal lymph node measuring approximately 1 cm (axial series 4, image 34). There is a precarinal lymph node measuring  approximately 1.4 cm in the short axis (previously measuring less than 4 mm). Hilar adenopathy is noted. --No axillary lymphadenopathy. --there are few mildly prominent left supraclavicular lymph nodes measuring up to approximately 0.8 cm (axial series 4, image 7). These are new since prior CT. --Normal thyroid gland. --The esophagus is  unremarkable Lungs/Pleura: There is a worsening ill-defined left hilar mass causing compression of the left mainstem bronchus as well as the left upper and left lower lobe bronchi. There is worsening postobstructive atelectasis in the superior left lower lobe and left upper lobe. The right perihilar region is essentially stable in appearance from prior study. There are small bilateral pleural effusions, right greater than left. Moderate emphysematous changes are noted bilaterally. There are branching airspace opacities in the left upper lobe concerning for pneumonia. There are few sub solid airspace opacities in the medial right lower lobe which may represent postobstructive atelectasis or pneumonia. Upper Abdomen: No acute abnormality. Musculoskeletal: No chest wall abnormality. No acute or significant osseous findings. IMPRESSION: 1. No acute pulmonary embolism. 2. Postobstructive pneumonia in the left upper lobe. 3. Worsening postobstructive findings in the left upper and left lower lobes with worsening ill-defined soft tissue in the left hilum is concerning for disease progression. Pulmonary medicine follow-up is recommended. 4. Worsening mediastinal, hilar, and left supraclavicular adenopathy. While this may be reactive, nodal metastatic disease is not excluded. 5. Trace to small bilateral pleural effusions. Aortic Atherosclerosis (ICD10-I70.0) and Emphysema (ICD10-J43.9). Electronically Signed   By: Constance Holster M.D.   On: 07/09/2019 22:09   MR BRAIN W WO CONTRAST  Result Date: 07/14/2019 CLINICAL DATA:  74 year old female with right lung cancer, non-small cell. Suspicion of disease progression in the chest recently. Staging. EXAM: MRI HEAD WITHOUT AND WITH CONTRAST TECHNIQUE: Multiplanar, multiecho pulse sequences of the brain and surrounding structures were obtained without and with intravenous contrast. CONTRAST:  69mL GADAVIST GADOBUTROL 1 MMOL/ML IV SOLN COMPARISON:  Noncontrast head CTs  06/27/2017 and earlier. FINDINGS: Brain: No restricted diffusion to suggest acute infarction. No midline shift, mass effect, evidence of mass lesion, ventriculomegaly, extra-axial collection or acute intracranial hemorrhage. Cervicomedullary junction and pituitary are within normal limits. No abnormal enhancement identified. There is motion artifact on the postcontrast images. No dural thickening. Scattered nonspecific cerebral white matter T2 and FLAIR hyperintensity, mild to moderate for age. None resembles vasogenic edema. Similar patchy T2 hyperintensity in the pons. No cortical encephalomalacia or chronic cerebral blood products identified. The deep gray nuclei and cerebellum appear negative. Vascular: Major intracranial vascular flow voids are preserved. Skull and upper cervical spine: Visualized bone marrow signal is within normal limits. Sinuses/Orbits: Postoperative changes to both globes, otherwise negative orbits. Paranasal sinuses are clear. Other: Mild bilateral mastoid effusions. Visible internal auditory structures appear normal. Scalp and face soft tissues appear negative. IMPRESSION: 1. No metastatic disease or acute intracranial abnormality identified. 2. Mild to moderate for age signal changes in the cerebral white matter and pons, most commonly due to chronic small vessel disease. Electronically Signed   By: Genevie Ann M.D.   On: 07/14/2019 23:51   US Venous Img Lower Bilateral  Result Date: 08/06/2019 CLINICAL DATA:  74 year old female with a history of lower extremity swelling EXAM: BILATERAL LOWER EXTREMITY VENOUS DOPPLER ULTRASOUND TECHNIQUE: Gray-scale sonography with graded compression, as well as color Doppler and duplex ultrasound were performed to evaluate the lower extremity deep venous systems from the level of the common femoral vein and including the common femoral, femoral, profunda femoral, popliteal and calf  veins including the posterior tibial, peroneal and gastrocnemius veins  when visible. The superficial great saphenous vein was also interrogated. Spectral Doppler was utilized to evaluate flow at rest and with distal augmentation maneuvers in the common femoral, femoral and popliteal veins. COMPARISON:  None. FINDINGS: RIGHT LOWER EXTREMITY Common Femoral Vein: No evidence of thrombus. Normal compressibility, respiratory phasicity and response to augmentation. Saphenofemoral Junction: No evidence of thrombus. Normal compressibility and flow on color Doppler imaging. Profunda Femoral Vein: No evidence of thrombus. Normal compressibility and flow on color Doppler imaging. Femoral Vein: No evidence of thrombus. Normal compressibility, respiratory phasicity and response to augmentation. Popliteal Vein: No evidence of thrombus. Normal compressibility, respiratory phasicity and response to augmentation. Calf Veins: No evidence of thrombus. Normal compressibility and flow on color Doppler imaging. Superficial Great Saphenous Vein: No evidence of thrombus. Normal compressibility and flow on color Doppler imaging. Other Findings:  None. LEFT LOWER EXTREMITY Common Femoral Vein: No evidence of thrombus. Normal compressibility, respiratory phasicity and response to augmentation. Saphenofemoral Junction: No evidence of thrombus. Normal compressibility and flow on color Doppler imaging. Profunda Femoral Vein: No evidence of thrombus. Normal compressibility and flow on color Doppler imaging. Femoral Vein: No evidence of thrombus. Normal compressibility, respiratory phasicity and response to augmentation. Popliteal Vein: No evidence of thrombus. Normal compressibility, respiratory phasicity and response to augmentation. Calf Veins: No evidence of thrombus. Normal compressibility and flow on color Doppler imaging. Superficial Great Saphenous Vein: No evidence of thrombus. Normal compressibility and flow on color Doppler imaging. Other Findings:  None. IMPRESSION: Sonographic survey of the bilateral  lower extremities negative for DVT Electronically Signed   By: Corrie Mckusick D.O.   On: 08/06/2019 15:52   DG Chest Portable 1 View  Result Date: 07/19/2019 CLINICAL DATA:  Shortness of breath. Lung cancer and COPD. EXAM: PORTABLE CHEST 1 VIEW COMPARISON:  07/09/2019 FINDINGS: Unchanged position of right chest wall Port-A-Cath. Bilateral parahilar opacities are unchanged. Opacities in the right lung base are slightly increased. No pleural effusion or pneumothorax. IMPRESSION: Slightly increased opacities in the right lung base may indicate developing consolidation. Otherwise unchanged radiograph. Electronically Signed   By: Ulyses Jarred M.D.   On: 07/19/2019 02:02    Wt Readings from Last 3 Encounters:  08/06/19 (!) 147 kg  07/20/19 116.7 kg  07/10/19 111.9 kg    EKG: Probable atrial flutter, no ischemia Physical Exam:  Blood pressure (!) 102/59, pulse 70, temperature 98.4 F (36.9 C), resp. rate (!) 23, height 5\' 10"  (1.778 m), weight (!) 147 kg, SpO2 95 %. Body mass index is 46.49 kg/m. General: Well developed, well nourished, in no acute distress. Head: Normocephalic, atraumatic, sclera non-icteric, no xanthomas, nares are without discharge.  Neck: Negative for carotid bruits. JVD not elevated. Lungs: Decreased breath sounds bilaterally Heart: Irregular regular rhythm. Abdomen: Soft, non-tender, non-distended with normoactive bowel sounds. No hepatomegaly. No rebound/guarding. No obvious abdominal masses. Msk:  Strength and tone appear normal for age. Extremities: No clubbing or cyanosis. No edema.  Distal pedal pulses are 2+ and equal bilaterally. Neuro: Alert and oriented X 3. No facial asymmetry. No focal deficit. Moves all extremities spontaneously. Psych:  Responds to questions appropriately with a normal affect.     Assessment and Plan   74 y.o. female with history of afib, chronic respiratory faiure on 2 liters of oxygen, lung carcinoma admitted with several seek istory of  increasing sob and lower extremety edema. She had tried diuretics at home with no improvement in her edema. CXR  revealed posobstructive pneumonitis and increase in her lung mass.  1. Lung carcinoma-being followed by oncology.  CT of the chest suggested possible progression.  May be partially the etiology of her lower extremity edema and should shortness of breath.   2. CHF-Echo from this visit pending.  Echo as outpatient less than 1 month ago showed ejection fraction of 40% with no high-grade valvular disease.  We will continue careful diuresis.  We will repeat BNP .  We will follow I's and O's.  Will need to follow hemodynamics.  Renal function remained stable.  Does not appear to have florid or significant increase in pulmonary edema by chest x-ray.  BNP has decreased from recent previous studies.  Will evaluate for any change on today's findings.   3. Afib-rate is fairly well controlled on Cardizem and metoprolol.  We will continue with Cardizem at 360 mg daily, metoprolol tartrate 50 mg twice daily and Eliquis at 5 mg twice daily.  Control appears to be a good option given her pulmonary disease.  Doubt conversion would be helpful.  Signed, Teodoro Spray MD 08/06/2019, 4:57 PM Pager: [phone]

## 2019-08-06 NOTE — ED Notes (Signed)
Pt given cup of water as requested. Family remains at bedside. Denies any other needs currently. Bed locked low. Rails up. Call bell within reach.

## 2019-08-06 NOTE — ED Notes (Signed)
Pt eating snack that Deneise Lever, Therapist, sports gave her.

## 2019-08-06 NOTE — ED Notes (Signed)
pt taken to xray

## 2019-08-07 ENCOUNTER — Encounter: Payer: Self-pay | Admitting: Internal Medicine

## 2019-08-07 ENCOUNTER — Inpatient Hospital Stay
Admit: 2019-08-07 | Discharge: 2019-08-07 | Disposition: A | Discharge status: Home / Self Care | Payer: Medicare Other | Attending: Internal Medicine | Admitting: Internal Medicine

## 2019-08-07 DIAGNOSIS — Z8041 Family history of malignant neoplasm of ovary: Secondary | ICD-10-CM

## 2019-08-07 DIAGNOSIS — Z87891 Personal history of nicotine dependence: Secondary | ICD-10-CM

## 2019-08-07 DIAGNOSIS — J441 Chronic obstructive pulmonary disease with (acute) exacerbation: Secondary | ICD-10-CM

## 2019-08-07 DIAGNOSIS — U071 COVID-19: Secondary | ICD-10-CM

## 2019-08-07 DIAGNOSIS — Z800 Family history of malignant neoplasm of digestive organs: Secondary | ICD-10-CM

## 2019-08-07 DIAGNOSIS — C7A8 Other malignant neuroendocrine tumors: Principal | ICD-10-CM

## 2019-08-07 LAB — GLUCOSE, CAPILLARY: Glucose-Capillary: 124 mg/dL — ABNORMAL HIGH (ref 70–99)

## 2019-08-07 LAB — BASIC METABOLIC PANEL
Anion gap: 10 (ref 5–15)
BUN: 15 mg/dL (ref 8–23)
CO2: 27 mmol/L (ref 22–32)
Calcium: 8.7 mg/dL — ABNORMAL LOW (ref 8.9–10.3)
Chloride: 95 mmol/L — ABNORMAL LOW (ref 98–111)
Creatinine, Ser: 0.51 mg/dL (ref 0.44–1.00)
GFR calc Af Amer: >60 mL/min (ref >60)
GFR calc non Af Amer: >60 mL/min (ref >60)
Glucose, Bld: 133 mg/dL — ABNORMAL HIGH (ref 70–99)
Potassium: 4.8 mmol/L (ref 3.5–5.1)
Sodium: 132 mmol/L — ABNORMAL LOW (ref 135–145)

## 2019-08-07 LAB — CBC
HCT: 32.6 % — ABNORMAL LOW (ref 36.0–46.0)
Hemoglobin: 10.3 g/dL — ABNORMAL LOW (ref 12.0–15.0)
MCH: 27.8 pg (ref 26.0–34.0)
MCHC: 31.6 g/dL (ref 30.0–36.0)
MCV: 88.1 fL (ref 80.0–100.0)
Platelets: 229 10*3/uL (ref 150–400)
RBC: 3.7 MIL/uL — ABNORMAL LOW (ref 3.87–5.11)
RDW: 16 % — ABNORMAL HIGH (ref 11.5–15.5)
WBC: 13.4 10*3/uL — ABNORMAL HIGH (ref 4.0–10.5)
nRBC: 0 % (ref 0.0–0.2)

## 2019-08-07 LAB — BRAIN NATRIURETIC PEPTIDE: B Natriuretic Peptide: 230 pg/mL — ABNORMAL HIGH (ref 0.0–100.0)

## 2019-08-07 LAB — TSH: TSH: 0.819 u[IU]/mL (ref 0.350–4.500)

## 2019-08-07 MED ORDER — FLUTICASONE FUROATE-VILANTEROL 100-25 MCG/INH IN AEPB
1.0000 | INHALATION_SPRAY | Freq: Every day | RESPIRATORY_TRACT | Status: DC
  Administered 2019-08-07 – 2019-08-09 (×3): 1 via RESPIRATORY_TRACT
  Filled 2019-08-07: qty 28

## 2019-08-07 MED ORDER — UMECLIDINIUM BROMIDE 62.5 MCG/INH IN AEPB
1.0000 | INHALATION_SPRAY | Freq: Every day | RESPIRATORY_TRACT | Status: DC
  Administered 2019-08-07 – 2019-08-09 (×3): 1 via RESPIRATORY_TRACT
  Filled 2019-08-07: qty 7

## 2019-08-07 MED ORDER — PERFLUTREN LIPID MICROSPHERE
1.0000 mL | INTRAVENOUS | Status: AC | PRN
  Administered 2019-08-07: 13:00:00 10 mL via INTRAVENOUS
  Filled 2019-08-07: qty 10

## 2019-08-07 MED ORDER — ORAL CARE MOUTH RINSE
15.0000 mL | Freq: Two times a day (BID) | OROMUCOSAL | Status: DC
  Administered 2019-08-07 – 2019-08-09 (×5): 15 mL via OROMUCOSAL

## 2019-08-07 NOTE — Progress Notes (Signed)
[facility] Progress Note  Patient: Cathy Watts    WNU:[id number]  DOA: 08/06/2019     Date of Service: the patient was seen and examined on 08/07/2019  Chief Complaint  Patient presents with  . Chest Pain  . Weakness   Brief hospital course: Cathy Watts is a 74 y.o. female with medical history significant for anxiety, atrial fibrillation, chronic respiratory failure on 2 L of oxygen, COPD and lung cancer who presents to the emergency room for evaluation of a 2-week history of chest pain, worsening shortness of breath and bilateral lower extremity swelling.  Chest pain occurs both at rest and with exertion she denies having any nausea, vomiting, palpitations or diaphoresis. She is on diuretics at home and her dosages have been increased without any improvement in her lower extremity swelling. Per daughter she has gained 10lbs in 2 weeks  She has a cough which is chronic.  She denies having any fever or chills  ED Course: Patient with a history of lung cancer without any treatment who presents to the emergency room for evaluation of lower extremity swelling and worsening shortness of breath.  Imaging shows worsening postobstructive pneumonitis and increase in the size of the lung mass.  She will be admitted for further evaluation. Patient received Solu-Medrol and bronchodilators in the emergency room.  Lasix was ordered but not administered due to relative hypotension   Currently further plan is to continue current treatment, follow cardiologist and oncologist  Assessment and Plan: Principal Problem:   Lung cancer ([place]) Active Problems:   COPD, moderate ([place])   Depression   Atrial fibrillation with RVR ([place])   Chronic respiratory failure with hypoxia ([place])   Lung cancer, main bronchus ([place])   Acute CHF (congestive heart failure) ([place])     Lung cancer  Patient presents for evaluation of worsening shortness of breath She had a CT scan of the chest done which showed  interval progression of disease with increase in size of left perihilar mass with progressive invasion of the left hilum and mediastinum.New left supraclavicular and right paratracheal adenopathy. New small left pleural effusion. Innumerable small nodules throughout the left lung arenonspecific in may be related to postobstructive changes. Metastaticdisease not excluded. Emphysema and aortic atherosclerosis. Three vessel coronary artery calcifications. 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  # Congestive heart failure Etiology unclear Patient has a 10lb weight gain in about 2 weeks Lower extremity edema, venous duplex negative for DVT f/u 2D echocardiogram to assess LVEF Continue Lasix 40 mg IV daily Ace wrap bilateral lower extremities and keep legs elevated Cardiology following   # Atrial fibrillation Continue Eliquis as primary prophylaxis for an acute stroke Rate control with diltiazem, and metoprolol 50 twice daily   # Depression Continue Lexapro and BuSpar, Klonopin 0.25 twice daily  # Restless leg syndrome, -on Requip   # GERD, -Carafate and PPI therapy   # Dyslipidemia, -on pravchol    Body mass index is 46.49 kg/m.  Morbid obesity Interventions: Calorie restricted diet and daily exercise advised to lose body weight    Diet: Cardiac diet DVT Prophylaxis: Eliquis  Advance goals of care discussion: Full code  Family Communication: No family was present at bedside, at the time of interview.  The pt provided permission to discuss medical plan with the family. Opportunity was given to ask  question and all questions were answered satisfactorily.   Disposition:  Pt is from home, admitted with shortness of breath and lower extremity edema, still has shortness of breath and edema of lower extremities, which precludes a safe discharge. Discharge to home, when edema improves and shortness of breath improves.  Subjective: Patient was seen and examined at bedside, no overnight issues.  Patient is feeling improvement in the shortness of breath, still has 4+ edema of lower extremities   Physical Exam: General:  alert oriented to time, place, and person.  Appear in mild distress, affect appropriate Eyes: PERRLA ENT: Oral Mucosa Clear, moist  Neck: no JVD,  Cardiovascular: S1 and S2 Present, no Murmur,  Respiratory: good respiratory effort, Bilateral Air entry equal and Decreased, mild Crackles, mild  wheezes Abdomen: Bowel Sound present, Soft and no tenderness,  Skin: no rashes Extremities: 4+ Pedal edema, no calf tenderness Neurologic: without any new focal findings Gait not checked due to patient safety concerns  Vitals:   08/06/19 2230 08/06/19 2318 08/07/19 0534 08/07/19 0828  BP: (!) 106/58   119/85  Pulse:    67  Resp:    20  Temp:  98.2 F (36.8 C) 97.6 F (36.4 C) 97.7 F (36.5 C)  TempSrc:  Oral Oral Oral  SpO2:    94%  Weight:      Height:        Intake/Output Summary (Last 24 hours) at 08/07/2019 1247 Last data filed at 08/06/2019 1756 Gross per 24 hour  Intake 10 ml  Output --  Net 10 ml   Filed Weights   08/06/19 1136  Weight: (!) 147 kg    Data Reviewed: I have personally reviewed and interpreted daily labs, tele strips, imagings as discussed above. I reviewed all nursing notes, pharmacy notes, vitals, pertinent old records I have discussed plan of care as described above with RN and patient/family.  CBC: Recent Labs  Lab 08/06/19 1139 08/07/19 0500  WBC 11.5* 13.4*  HGB 10.8* 10.3*  HCT 34.6* 32.6*  MCV 89.6 88.1  PLT 236 937   Basic  Metabolic Panel: Recent Labs  Lab 08/06/19 1139 08/07/19 0500  NA 133* 132*  K 3.7 4.8  CL 93* 95*  CO2 30 27  GLUCOSE 114* 133*  BUN 15 15  CREATININE 0.63 0.51  CALCIUM 8.5* 8.7*  MG 1.7  --     Studies: US Venous Img Lower Bilateral  Result Date: 08/06/2019 CLINICAL DATA:  74 year old female with a history of lower extremity swelling EXAM: BILATERAL LOWER EXTREMITY VENOUS DOPPLER ULTRASOUND TECHNIQUE: Gray-scale sonography with graded compression, as well as color Doppler and duplex ultrasound were performed to evaluate the lower extremity deep venous systems from the level of the common femoral vein and including the common femoral, femoral, profunda femoral, popliteal and calf veins including the posterior tibial, peroneal and gastrocnemius veins when visible. The superficial great saphenous vein was also interrogated. Spectral Doppler was utilized to evaluate flow at rest and with distal augmentation maneuvers in the common femoral, femoral and popliteal veins. COMPARISON:  None. FINDINGS: RIGHT LOWER EXTREMITY Common Femoral Vein: No evidence of thrombus. Normal compressibility, respiratory phasicity and response to augmentation. Saphenofemoral Junction: No evidence of thrombus. Normal compressibility and flow on color Doppler imaging. Profunda Femoral Vein: No evidence of thrombus. Normal compressibility and  flow on color Doppler imaging. Femoral Vein: No evidence of thrombus. Normal compressibility, respiratory phasicity and response to augmentation. Popliteal Vein: No evidence of thrombus. Normal compressibility, respiratory phasicity and response to augmentation. Calf Veins: No evidence of thrombus. Normal compressibility and flow on color Doppler imaging. Superficial Great Saphenous Vein: No evidence of thrombus. Normal compressibility and flow on color Doppler imaging. Other Findings:  None. LEFT LOWER EXTREMITY Common Femoral Vein: No evidence of thrombus. Normal compressibility,  respiratory phasicity and response to augmentation. Saphenofemoral Junction: No evidence of thrombus. Normal compressibility and flow on color Doppler imaging. Profunda Femoral Vein: No evidence of thrombus. Normal compressibility and flow on color Doppler imaging. Femoral Vein: No evidence of thrombus. Normal compressibility, respiratory phasicity and response to augmentation. Popliteal Vein: No evidence of thrombus. Normal compressibility, respiratory phasicity and response to augmentation. Calf Veins: No evidence of thrombus. Normal compressibility and flow on color Doppler imaging. Superficial Great Saphenous Vein: No evidence of thrombus. Normal compressibility and flow on color Doppler imaging. Other Findings:  None. IMPRESSION: Sonographic survey of the bilateral lower extremities negative for DVT Electronically Signed   By: Corrie Mckusick D.O.   On: 08/06/2019 15:52    Scheduled Meds: . apixaban  5 mg Oral BID  . ascorbic acid  500 mg Oral Daily  . busPIRone  30 mg Oral BID  . diltiazem  360 mg Oral Daily  . escitalopram  20 mg Oral Daily  . fluticasone furoate-vilanterol  1 puff Inhalation Daily  . furosemide  40 mg Intravenous Daily  . furosemide  80 mg Intravenous Once  . magnesium oxide  200 mg Oral BID  . mouth rinse  15 mL Mouth Rinse BID  . metoprolol tartrate  50 mg Oral BID  . pantoprazole  40 mg Oral Daily  . polyethylene glycol  17 g Oral Daily  . pravastatin  20 mg Oral Daily  . rOPINIRole  3 mg Oral QHS  . sodium chloride flush  3 mL Intravenous Once  . sodium chloride flush  3 mL Intravenous Q12H  . sucralfate  1 g Oral TID  . umeclidinium bromide  1 puff Inhalation Daily   Continuous Infusions: . sodium chloride     PRN Meds: sodium chloride, acetaminophen **OR** acetaminophen, clonazePAM, ipratropium-albuterol, ondansetron **OR** ondansetron (ZOFRAN) IV, sodium chloride flush  Time spent: 35 minutes  Author: Val Riles. MD [facility] 08/07/2019 12:47  PM  To reach On-call, see care teams to locate the attending and reach out to them via [email or web address]. If 7PM-7AM, please contact night-coverage If you still have difficulty reaching the attending provider, please page the [facility] (Director on Call) for [facility] on amion for assistance.

## 2019-08-07 NOTE — Progress Notes (Signed)
*  PRELIMINARY RESULTS* Echocardiogram 2D Echocardiogram has been performed. Definity IV Contrast used on this study.  Cathy Watts 08/07/2019, 12:34 PM

## 2019-08-07 NOTE — Consult Note (Signed)
[facility]  Date of admission:  08/06/2019  Inpatient day:  08/07/2019  Consulting physician: Dr Royce Macadamia Agbata   Reason for Consultation:  Lung cancer  Chief Complaint: Cathy Watts is a 74 y.o. 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  HPI:  The patient has a history of stage IA RLL squamous cell carcinoma s/p radiation.  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  She received 4 cycles of carboplatin and etoposide (last 09/15/2018) with concurrent radiation (completed 08/20/2018).  Chest CT on 04/12/2019 revealed interval progression of mass-like architectural distortion with the bilateral hilar regions favored to be sequelae from radiation.  Patient presented at tumor board with decision for follow-up imaging.  The patient was admitted to [facility] from 07/09/2019 - 07/15/2019 with likely post-obstructive pneumonia likely secondary to progression of lung cancer.  Chest CT angiogram on 07/09/2019 revealed no pulmonary embolism, but post-obstructive pneumonia in the LUL.  There was worsening ill defined soft tissue in the left hilum concerning for progression.  There was worsening mediastinal, hilar, and left supraclavicular adenopathy.  She was treated with IV antibiotics, bronchodilators, and oxygen.  She was discharged on 4 liters/min oxygen via [place].  Course notable for atrial flutter with RVR treated with increased diltiazem.  She was discharged on Levaquin.  The patient was seen by Dr Patsey Berthold on 07/12/2019.  She was initially scheduled for outpatient bronchoscopy and stenting of her airway on 07/23/2019.  She tested + COVID-19 on 07/19/2019.  She was treated with remdesivir and steroids.  The procedure was postponed  until early 08/2019.  Chest CT on 08/05/2019 revealed interval progression of disease, increased left perihilar mass (6.9 x 6.6 cm compared to 5.2 x 3.6 cm) with progressive invasion of the left hilum and mediastinum.  There was a new 1.1 cm left supraclavicular and 1.3 cm right paratracheal adenopathy and a new small left pleural effusion.  There were innumerable small nodules throughout the left lung.  There was a new 5 mm nodule in the posterolateral RUL.  She was seen in the [facility] ER on 08/06/2019 with a 2 week history of chest pain, increased shortness of breath, and bilateral lower extremity swelling. Outpatient diuretics had been increased without improvement.  CXR was stable.  Bilateral lower extremity duplex revealed no evidence of DVT.  Symptomatically, she notes shortness of breath.  She has a cough productive of phlegm.  She denies any fever.   Past Medical History:  Diagnosis Date  . Abdominal aortic aneurysm (AAA) 3.0 cm to 5.0 cm in diameter in female [facility]) 05/2018   being followed by dr. Delana Meyer. will reultrasound in 1 year  . Anxiety   . Asthma   . Barrett's esophagus   . COPD (chronic obstructive pulmonary disease) ([place])   . Depression   . GERD (gastroesophageal reflux disease)   . History of peptic ulcer disease   . Hypercholesterolemia   . Low oxygen saturation    2l hs  . Malignant neoplasm of upper lobe of left lung ([place]) 04/2019  . Osteopenia   . Panic disorder   . Personal history of radiation therapy   . Personal history of tobacco use, presenting hazards to health 01/16/2015  . Requires supplemental oxygen 05/2018   supposed to use 2L NP  at night but she currently does not.  encouraged patient to resume this  . Sleep apnea    snores significantly but has never been tested for OSA  . SOB (shortness of breath)   . Squamous cell carcinoma of right lung ([place]) 2016   Chemo + Rad tx's    Past Surgical History:  Procedure Laterality Date  . CERVICAL CONE BIOPSY   1990  . CHOLECYSTECTOMY  1980's  . colonoscopy  2014  . COLONOSCOPY  07/21/2012  . COLONOSCOPY WITH PROPOFOL N/A 05/12/2018   Procedure: COLONOSCOPY WITH PROPOFOL;  Surgeon: Lollie Sails, MD;  Location: [facility] ENDOSCOPY;  Service: Endoscopy;  Laterality: N/A;  . ELBOW FRACTURE SURGERY Right 2009  . ENDOBRONCHIAL ULTRASOUND N/A 02/07/2015   Procedure: ENDOBRONCHIAL ULTRASOUND;  Surgeon: Flora Lipps, MD;  Location: [facility] ORS;  Service: Cardiopulmonary;  Laterality: N/A;  . ENDOBRONCHIAL ULTRASOUND Left 06/05/2018   Procedure: ENDOBRONCHIAL ULTRASOUND;  Surgeon: Flora Lipps, MD;  Location: [facility] ORS;  Service: Cardiopulmonary;  Laterality: Left;  . ESOPHAGOGASTRODUODENOSCOPY (EGD) WITH PROPOFOL N/A 02/26/2016   Procedure: ESOPHAGOGASTRODUODENOSCOPY (EGD) WITH PROPOFOL;  Surgeon: Lollie Sails, MD;  Location: [facility] ENDOSCOPY;  Service: Endoscopy;  Laterality: N/A;  COPD, Sleep Apnea  . ESOPHAGOGASTRODUODENOSCOPY (EGD) WITH PROPOFOL N/A 05/12/2018   Procedure: ESOPHAGOGASTRODUODENOSCOPY (EGD) WITH PROPOFOL;  Surgeon: Lollie Sails, MD;  Location: [facility] ENDOSCOPY;  Service: Endoscopy;  Laterality: N/A;  . EYE SURGERY Bilateral    cataract extractions  . FRACTURE SURGERY  2005   elbow repair  . PORTA CATH INSERTION N/A 06/17/2018   Procedure: PORTA CATH INSERTION;  Surgeon: Katha Cabal, MD;  Location: [place] CV LAB;  Service: Cardiovascular;  Laterality: N/A;  . TONSILLECTOMY  1979  . TUBAL LIGATION  1970's  . UPPER GI ENDOSCOPY      Family History  Problem Relation Age of Onset  . Colon cancer Mother        colon  . Diabetes Mother   . Arthritis Mother   . Glaucoma Mother   . Stomach cancer Father        stomach  . Throat cancer Brother        throat  . Breast cancer Sister        breast  . Diabetes Sister   . Cirrhosis Sister   . Cancer Paternal Aunt   . Parkinson's disease Sister   . Heart attack Sister   . Ovarian cancer Sister     Social History:   reports that she quit smoking about 21 months ago. Her smoking use included cigarettes. She has a 55.00 pack-year smoking history. She has never used smokeless tobacco. She reports current alcohol use. She reports that she does not use drugs.  She is alone today.  Allergies:  Allergies  Allergen Reactions  . Amoxicillin Hives and Itching    Did it involve swelling of the face/tongue/throat, SOB, or low BP? No Did it involve sudden or severe rash/hives, skin peeling, or any reaction on the inside of your mouth or nose? No Did you need to seek medical attention at a hospital or doctor's office? No When did it last happen?2018 If all above answers are "NO", may proceed with cephalosporin use.     . Chlorhexidine Dermatitis    Patient refused    Medications Prior to Admission  Medication Sig Dispense Refill  . apixaban (ELIQUIS) 5 MG TABS tablet Take 1 tablet (5 mg total) by mouth 2 (two) times daily. 60 tablet 1  .  busPIRone (BUSPAR) 30 MG tablet Take 1 tablet by mouth twice daily (Patient taking differently: Take 30 mg by mouth 2 (two) times daily. ) 180 tablet 1  . diltiazem (CARDIZEM CD) 360 MG 24 hr capsule Take 1 capsule (360 mg total) by mouth daily. 30 capsule 0  . escitalopram (LEXAPRO) 20 MG tablet Take 1 tablet by mouth once daily (Patient taking differently: Take 20 mg by mouth daily. ) 90 tablet 1  . Fluticasone-Umeclidin-Vilant (TRELEGY ELLIPTA) 100-62.5-25 MCG/INH AEPB Inhale 1 puff into the lungs daily. 180 each 3  . furosemide (LASIX) 20 MG tablet Take 1 tablet (20 mg total) by mouth daily. (Patient taking differently: Take 40 mg by mouth daily. ) 90 tablet 1  . ipratropium (ATROVENT HFA) 17 MCG/ACT inhaler Inhale 2 puffs into the lungs 4 (four) times daily. 1 Inhaler 2  . Magnesium 250 MG TABS Take 250 mg by mouth 2 (two) times daily.     . metoprolol tartrate (LOPRESSOR) 50 MG tablet Take 50 mg by mouth 2 (two) times daily.    Marland Kitchen nystatin (MYCOSTATIN) 100000 UNIT/ML  suspension TAKE 5 ML BY MOUTH 4 TIMES DAILY 240 mL 0  . omeprazole (PRILOSEC) 40 MG capsule Take 1 capsule by mouth once daily 90 capsule 0  . polyethylene glycol powder (GLYCOLAX/MIRALAX) powder TAKE 17 GRAMS OF POWDER MIXED IN 8 OUNCES OF WATER BY MOUTH ONCE A DAY. 255 g 3  . potassium chloride SA (KLOR-CON) 20 MEQ tablet Take 1 tablet (20 mEq total) by mouth daily. 30 tablet 3  . pravastatin (PRAVACHOL) 20 MG tablet TAKE 1 TABLET BY MOUTH AT BEDTIME (Patient taking differently: Take 20 mg by mouth daily. ) 90 tablet 1  . rOPINIRole (REQUIP) 3 MG tablet TAKE 1 TABLET BY MOUTH AT BEDTIME 90 tablet 2  . sucralfate (CARAFATE) 1 g tablet Take 1 g by mouth 3 (three) times daily. DISSOLVE IN THREE TO FOUR TABLESPOONFULS OF WARM WARM SWISH AND SWALLOW    . [EXPIRED] vitamin C (VITAMIN C) 500 MG tablet Take 1 tablet (500 mg total) by mouth daily for 14 days. 14 tablet 0  . acetaminophen (TYLENOL) 325 MG tablet Take 2 tablets (650 mg total) by mouth every 6 (six) hours as needed for mild pain or headache (fever >/= 101).    . clonazePAM (KLONOPIN) 0.5 MG tablet Take 0.5-1 tablets (0.25-0.5 mg total) by mouth 2 (two) times daily as needed for anxiety. (Patient taking differently: Take 0.25-0.5 mg by mouth daily as needed for anxiety. ) 20 tablet 1  . ipratropium-albuterol (DUONEB) 0.5-2.5 (3) MG/3ML SOLN Take 3 mLs by nebulization every 6 (six) hours as needed. (Patient taking differently: Take 3 mLs by nebulization every 6 (six) hours as needed (shortness of breath). ) 360 mL 5  . PROAIR HFA 108 (90 Base) MCG/ACT inhaler INHALE 2 PUFFS BY MOUTH EVERY 4 HOURS AS NEEDED FOR WHEEZING FOR SHORTNESS OF BREATH (Patient taking differently: Inhale 2 puffs into the lungs every 4 (four) hours as needed for wheezing or shortness of breath. ) 9 g 0  . Respiratory Therapy Supplies (FLUTTER) DEVI 1 each by Does not apply route QID. 1 each 0    Review of Systems: Review of Systems  Constitutional: Positive for  malaise/fatigue. Negative for chills, fever and weight loss (gain).  HENT: Negative for congestion, ear pain, nosebleeds, sinus pain and sore throat.   Eyes: Negative for blurred vision, double vision and photophobia.  Respiratory: Positive for cough, sputum production (phlegm) and shortness  of breath. Negative for hemoptysis.   Cardiovascular: Positive for leg swelling. Negative for chest pain, orthopnea and claudication.       Atrial fibrillation on Eliquis and diltiazem.  Gastrointestinal: Positive for heartburn (on Carafate and PPI). Negative for abdominal pain, blood in stool, constipation, diarrhea, melena, nausea and vomiting.  Genitourinary: Negative.  Negative for dysuria, frequency, hematuria and urgency.  Musculoskeletal: Negative for back pain, joint pain, myalgias and neck pain.  Skin: Negative.  Negative for itching and rash.  Neurological: Negative for dizziness, tingling, tremors, sensory change, speech change, focal weakness, weakness and headaches.  Endo/Heme/Allergies: Negative for environmental allergies. Does not bruise/bleed easily.  Psychiatric/Behavioral: Positive for depression (on Lexapro and BusPar). Negative for memory loss. The patient is not nervous/anxious and does not have insomnia.    Vitals: Blood pressure 119/85, pulse 67, temperature 97.7 F (36.5 C), temperature source Oral, resp. rate 20, height 5\' 10"  (1.778 m), weight (!) 324 lb (147 kg), SpO2 94 %.   Physical Exam  Nursing note and vitals reviewed. Constitutional: She is oriented to person, place, and time. She appears well-developed and well-nourished. No distress.  HENT:  Head: Normocephalic and atraumatic.  Mouth/Throat: No oropharyngeal exudate.  Short brown hair.  Bryant in place.  Neck: No JVD present.  Cardiovascular: Normal rate and normal heart sounds. Exam reveals no gallop.  No murmur heard. Respiratory: No respiratory distress. She has no wheezes. She exhibits no tenderness.  Decreased  breath sounds left chest.  Rhonchi.  GI: Soft. Bowel sounds are normal. She exhibits no distension and no mass. There is no abdominal tenderness. There is no rebound and no guarding.  Musculoskeletal:        General: Edema (2+ pitting bilateral lower extremity) present.     Cervical back: Normal range of motion and neck supple.  Lymphadenopathy:       Head (right side): No preauricular, no posterior auricular and no occipital adenopathy present.       Head (left side): No preauricular, no posterior auricular and no occipital adenopathy present.    She has no cervical adenopathy.    She has no axillary adenopathy.       Right: No inguinal and no supraclavicular adenopathy present.       Left: No inguinal and no supraclavicular adenopathy present.  Neurological: She is alert and oriented to person, place, and time.  Skin: Skin is warm and dry. No rash noted. She is not diaphoretic. No erythema. No pallor.  Psychiatric: She has a normal mood and affect. Her behavior is normal. Judgment and thought content normal.     Results for orders placed or performed during the hospital encounter of 08/06/19 (from the past 48 hour(s))  Basic metabolic panel     Status: Abnormal   Collection Time: 08/06/19 11:39 AM  Result Value Ref Range   Sodium 133 (L) 135 - 145 mmol/L   Potassium 3.7 3.5 - 5.1 mmol/L   Chloride 93 (L) 98 - 111 mmol/L   CO2 30 22 - 32 mmol/L   Glucose, Bld 114 (H) 70 - 99 mg/dL    Comment: Glucose reference range applies only to samples taken after fasting for at least 8 hours.   BUN 15 8 - 23 mg/dL   Creatinine, Ser 0.63 0.44 - 1.00 mg/dL   Calcium 8.5 (L) 8.9 - 10.3 mg/dL   GFR calc non Af Amer >60 >60 mL/min   GFR calc Af Amer >60 >60 mL/min   Anion gap 10  5 - 15    Comment: Performed at [facility], [address] [place], [place] [address]  CBC     Status: Abnormal   Collection Time: 08/06/19 11:39 AM  Result Value Ref Range   WBC 11.5 (H) 4.0 - 10.5 K/uL    RBC 3.86 (L) 3.87 - 5.11 MIL/uL   Hemoglobin 10.8 (L) 12.0 - 15.0 g/dL   HCT 34.6 (L) 36.0 - 46.0 %   MCV 89.6 80.0 - 100.0 fL   MCH 28.0 26.0 - 34.0 pg   MCHC 31.2 30.0 - 36.0 g/dL   RDW 16.5 (H) 11.5 - 15.5 %   Platelets 236 150 - 400 K/uL   nRBC 0.0 0.0 - 0.2 %    Comment: Performed at [facility], [address] [place], [place] [address]  Troponin I (High Sensitivity)     Status: None   Collection Time: 08/06/19 11:39 AM  Result Value Ref Range   Troponin I (High Sensitivity) 7 <18 ng/L    Comment: (NOTE) Elevated high sensitivity troponin I (hsTnI) values and significant  changes across serial measurements may suggest ACS but many other  chronic and acute conditions are known to elevate hsTnI results.  Refer to the "Links" section for chest pain algorithms and additional  guidance. Performed at [facility], [address] [place], [place] [address]   Brain natriuretic peptide     Status: Abnormal   Collection Time: 08/06/19 11:39 AM  Result Value Ref Range   B Natriuretic Peptide 144.0 (H) 0.0 - 100.0 pg/mL    Comment: Performed at [facility], [address] [place], [place] [address]  Hepatic function panel     Status: Abnormal   Collection Time: 08/06/19 11:39 AM  Result Value Ref Range   Total Protein 6.4 (L) 6.5 - 8.1 g/dL   Albumin 2.6 (L) 3.5 - 5.0 g/dL   AST 24 15 - 41 U/L   ALT 47 (H) 0 - 44 U/L   Alkaline Phosphatase 67 38 - 126 U/L   Total Bilirubin 0.7 0.3 - 1.2 mg/dL   Bilirubin, Direct 0.1 0.0 - 0.2 mg/dL   Indirect Bilirubin 0.6 0.3 - 0.9 mg/dL    Comment: Performed at [facility], [address] [place], [place] [address]  Magnesium     Status: None   Collection Time: 08/06/19 11:39 AM  Result Value Ref Range   Magnesium 1.7 1.7 - 2.4 mg/dL    Comment: Performed at [facility], [address] [place], [place] [address]  Blood culture (routine x 2)     Status: None (Preliminary result)    Collection Time: 08/06/19 12:36 PM   Specimen: BLOOD  Result Value Ref Range   Specimen Description BLOOD LAC    Special Requests      BOTTLES DRAWN AEROBIC AND ANAEROBIC Blood Culture results may not be optimal due to an inadequate volume of blood received in culture bottles   Culture      NO GROWTH < 24 HOURS Performed at [facility], [address] [place], [place] [address]    Report Status PENDING   Blood culture (routine x 2)     Status: None (Preliminary result)   Collection Time: 08/06/19 12:36 PM   Specimen: BLOOD  Result Value Ref Range   Specimen Description BLOOD R H    Special Requests      BOTTLES DRAWN AEROBIC AND ANAEROBIC Blood Culture results may not be optimal  due to an inadequate volume of blood received in culture bottles   Culture      NO GROWTH < 24 HOURS Performed at [facility], [address] [place], [place] [address]    Report Status PENDING   Urinalysis, Complete w Microscopic     Status: Abnormal   Collection Time: 08/06/19  5:53 PM  Result Value Ref Range   Color, Urine YELLOW (A) YELLOW   APPearance HAZY (A) CLEAR   Specific Gravity, Urine 1.023 1.005 - 1.030   pH 5.0 5.0 - 8.0   Glucose, UA 50 (A) NEGATIVE mg/dL   Hgb urine dipstick NEGATIVE NEGATIVE   Bilirubin Urine NEGATIVE NEGATIVE   Ketones, ur NEGATIVE NEGATIVE mg/dL   Protein, ur 30 (A) NEGATIVE mg/dL   Nitrite NEGATIVE NEGATIVE   Leukocytes,Ua TRACE (A) NEGATIVE   RBC / HPF 6-10 0 - 5 RBC/hpf   WBC, UA 0-5 0 - 5 WBC/hpf   Bacteria, UA NONE SEEN NONE SEEN   Squamous Epithelial / LPF 0-5 0 - 5   Mucus PRESENT     Comment: Performed at [facility], [place], [place] [address]  Troponin I (High Sensitivity)     Status: None   Collection Time: 08/06/19  5:53 PM  Result Value Ref Range   Troponin I (High Sensitivity) 6 <18 ng/L    Comment: (NOTE) Elevated high sensitivity troponin I (hsTnI) values and significant  changes across  serial measurements may suggest ACS but many other  chronic and acute conditions are known to elevate hsTnI results.  Refer to the "Links" section for chest pain algorithms and additional  guidance. Performed at [facility], [address] [place], [place] [address]   TSH     Status: None   Collection Time: 08/07/19  5:00 AM  Result Value Ref Range   TSH 0.819 0.350 - 4.500 uIU/mL    Comment: Performed by a 3rd Generation assay with a functional sensitivity of <=0.01 uIU/mL. Performed at [facility], [address] [place], [place] [address]   Basic metabolic panel     Status: Abnormal   Collection Time: 08/07/19  5:00 AM  Result Value Ref Range   Sodium 132 (L) 135 - 145 mmol/L   Potassium 4.8 3.5 - 5.1 mmol/L   Chloride 95 (L) 98 - 111 mmol/L   CO2 27 22 - 32 mmol/L   Glucose, Bld 133 (H) 70 - 99 mg/dL    Comment: Glucose reference range applies only to samples taken after fasting for at least 8 hours.   BUN 15 8 - 23 mg/dL   Creatinine, Ser 0.51 0.44 - 1.00 mg/dL   Calcium 8.7 (L) 8.9 - 10.3 mg/dL   GFR calc non Af Amer >60 >60 mL/min   GFR calc Af Amer >60 >60 mL/min   Anion gap 10 5 - 15    Comment: Performed at [facility], [address] [place], [place] [address]  CBC     Status: Abnormal   Collection Time: 08/07/19  5:00 AM  Result Value Ref Range   WBC 13.4 (H) 4.0 - 10.5 K/uL   RBC 3.70 (L) 3.87 - 5.11 MIL/uL   Hemoglobin 10.3 (L) 12.0 - 15.0 g/dL   HCT 32.6 (L) 36.0 - 46.0 %   MCV 88.1 80.0 - 100.0 fL   MCH 27.8 26.0 - 34.0 pg   MCHC 31.6 30.0 - 36.0 g/dL   RDW 16.0 (H) 11.5 -  15.5 %   Platelets 229 150 - 400 K/uL   nRBC 0.0 0.0 - 0.2 %    Comment: Performed at [facility], [address] [place], [place] [address]  Brain natriuretic peptide     Status: Abnormal   Collection Time: 08/07/19  5:00 AM  Result Value Ref Range   B Natriuretic Peptide 230.0 (H) 0.0 - 100.0 pg/mL    Comment: Performed at [facility], [address] [place], [place] [address]  Glucose, capillary     Status: Abnormal   Collection Time: 08/07/19  8:25 AM  Result Value Ref Range   Glucose-Capillary 124 (H) 70 - 99 mg/dL    Comment: Glucose reference range applies only to samples taken after fasting for at least 8 hours.   DG Chest 2 View  Result Date: 08/06/2019 CLINICAL DATA:  Increasing weakness since yesterday EXAM: CHEST - 2 VIEW COMPARISON:  07/19/2019 FINDINGS: Right-sided Port-A-Cath in satisfactory position. Bilateral perihilar opacities, left greater than right, unchanged compared with the prior examination of 07/19/2019. small left pleural effusion. No pneumothorax. Stable heart size. No acute osseous abnormality. IMPRESSION: Stable appearance of the left perihilar airspace opacity consistent with history of known malignancy. Stable posttreatment changes of the right hilum. Electronically Signed   By: Kathreen Devoid   On: 08/06/2019 12:16   CT Chest W Contrast  Result Date: 08/05/2019 CLINICAL DATA:  Restaging lung cancer. EXAM: CT CHEST WITH CONTRAST TECHNIQUE: Multidetector CT imaging of the chest was performed during intravenous contrast administration. CONTRAST:  31mL OMNIPAQUE IOHEXOL 300 MG/ML  SOLN COMPARISON:  CT chest 04/12/19 FINDINGS: Cardiovascular: The heart size appears within normal limits. Aortic atherosclerosis. Three vessel coronary artery calcifications. No pericardial effusion. Mediastinum/Nodes: Normal appearance of the thyroid gland. The trachea appears patent and is midline. Normal appearance of the esophagus. -new left supraclavicular adenopathy is noted measuring up to 1.1 cm, image 16/2. -left pre-vascular lymph node measures 1.3 cm, image 53/2. Previously 0.4 cm. -right paratracheal lymph node measures 1.3 cm, image 58/2. Previously 0.4 cm. Lungs/Pleura: Left lung perihilar mass has increased in size from previous exam. This measures 6.9 x 6.6 cm, image 68/2. Previously 5.2 x 3.6 cm.  Progressive invasion of the left hilum and mediastinum is identified with narrowing of the right mainstem bronchus, image 65/2 progressive postobstructive pneumonitis within the left upper lobe and left lower lobe noted. Small left pleural effusion is also new from previous exam. Innumerable small pulmonary nodules are noted throughout the left lung which are nonspecific. Some of the small nodules have a halo of ground-glass attenuation. Findings may be related to postobstructive changes. Metastatic disease is not excluded. Post treatment changes involving the right hilar region are stable. There are advanced changes of centrilobular and paraseptal emphysema identified within the right lung. Stable posterior right upper lobe lung nodule measuring 3 mm. New nodule within the posterolateral right upper lobe is identified measuring 5 mm, image 58/3. Upper Abdomen: Unchanged 8 mm dome of liver hypodensity. Left adrenal gland hyperplasia. Unchanged. Musculoskeletal: Superior endplate compression deformity involving the T7 vertebra is stable. No acute bone abnormality. IMPRESSION: 1. Interval progression of disease. 2. Increase in size of left perihilar mass with progressive invasion of the left hilum and mediastinum. 3. New left supraclavicular and right paratracheal adenopathy. 4. New small left pleural effusion. 5. Innumerable small nodules throughout the left lung are nonspecific in may be related to postobstructive changes. Metastatic disease not excluded. 6. Similar appearance of post treatment changes within  the perihilar right lung. 7. A small nodule within the posterolateral right upper lobe is identified, new from previous exam. Cannot rule out metastatic disease. 8. Emphysema and aortic atherosclerosis. 9. Three vessel coronary artery calcifications. Aortic Atherosclerosis (ICD10-I70.0) and Emphysema (ICD10-J43.9). Electronically Signed   By: Kerby Moors M.D.   On: 08/05/2019 15:19   US Venous Img Lower  Bilateral  Result Date: 08/06/2019 CLINICAL DATA:  74 year old female with a history of lower extremity swelling EXAM: BILATERAL LOWER EXTREMITY VENOUS DOPPLER ULTRASOUND TECHNIQUE: Gray-scale sonography with graded compression, as well as color Doppler and duplex ultrasound were performed to evaluate the lower extremity deep venous systems from the level of the common femoral vein and including the common femoral, femoral, profunda femoral, popliteal and calf veins including the posterior tibial, peroneal and gastrocnemius veins when visible. The superficial great saphenous vein was also interrogated. Spectral Doppler was utilized to evaluate flow at rest and with distal augmentation maneuvers in the common femoral, femoral and popliteal veins. COMPARISON:  None. FINDINGS: RIGHT LOWER EXTREMITY Common Femoral Vein: No evidence of thrombus. Normal compressibility, respiratory phasicity and response to augmentation. Saphenofemoral Junction: No evidence of thrombus. Normal compressibility and flow on color Doppler imaging. Profunda Femoral Vein: No evidence of thrombus. Normal compressibility and flow on color Doppler imaging. Femoral Vein: No evidence of thrombus. Normal compressibility, respiratory phasicity and response to augmentation. Popliteal Vein: No evidence of thrombus. Normal compressibility, respiratory phasicity and response to augmentation. Calf Veins: No evidence of thrombus. Normal compressibility and flow on color Doppler imaging. Superficial Great Saphenous Vein: No evidence of thrombus. Normal compressibility and flow on color Doppler imaging. Other Findings:  None. LEFT LOWER EXTREMITY Common Femoral Vein: No evidence of thrombus. Normal compressibility, respiratory phasicity and response to augmentation. Saphenofemoral Junction: No evidence of thrombus. Normal compressibility and flow on color Doppler imaging. Profunda Femoral Vein: No evidence of thrombus. Normal compressibility and flow on  color Doppler imaging. Femoral Vein: No evidence of thrombus. Normal compressibility, respiratory phasicity and response to augmentation. Popliteal Vein: No evidence of thrombus. Normal compressibility, respiratory phasicity and response to augmentation. Calf Veins: No evidence of thrombus. Normal compressibility and flow on color Doppler imaging. Superficial Great Saphenous Vein: No evidence of thrombus. Normal compressibility and flow on color Doppler imaging. Other Findings:  None. IMPRESSION: Sonographic survey of the bilateral lower extremities negative for DVT Electronically Signed   By: Corrie Mckusick D.O.   On: 08/06/2019 15:52    Assessment:  The patient is a 74 y.o. 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  She received 4 cycles of carboplatin and etoposide (last 09/15/2018) with concurrent radiation (completed 08/20/2018).  Chest CT on 08/05/2019 revealed interval progression of disease with increased left perihilar mass (6.9 x 6.6 cm compared to 5.2 x 3.6 cm) with progressive invasion of the left hilum and mediastinum.  There was a new 1.1 cm left supraclavicular and 1.3 cm right paratracheal adenopathy and a new small left pleural effusion.  There were innumerable small nodules throughout the left lung.  There was a new 5 mm nodule in the posterolateral RUL.  Head MRI on 07/14/2019 revealed no evidence of metastatic disease.  She was COVID-19+ on 07/19/2019.  She presented with a 2 week history of chest pain, increased shortness of breath, and bilateral lower extremity swelling.  Bilateral lower extremity duplex revealed no evidence of DVT.  Plan:   1.   Large cell neuroendocrine tumor  Imaging personally reviewed.  Agree with radiology interpretation.  Concern for progressive disease.  Patient was seen by Dr Patsey Berthold who was planning bronchoscopy  (possible EBUS) and biopsy to document recurrence vs radiation induced scarring.   In addition, discussion was held about possible cryotherapy, balloon dilatation and endobronchial stent placement in the left mainstem bronchus.   Anticipate procedure once cleared of COVID-19.  If progression documented:   Consider carboplatin and etoposide (as it has been > 6 months since treatment) with concurrent immunotherapy based on extrapolation from small cell lung cancer data.  Thank you for allowing me to participate in Cathy Watts 's care.  I will follow her closely with you while hospitalized until Dr Aletha Halim return on 08/09/2019.   Lequita Asal, MD  08/07/2019, 9:52 AM

## 2019-08-07 NOTE — Progress Notes (Signed)
Patient Name: Cathy Watts Date of Encounter: 08/07/2019  Hospital Problem List     Principal Problem:   Lung cancer [facility]) Active Problems:   COPD, moderate ([place])   Depression   Atrial fibrillation with RVR ([place])   Chronic respiratory failure with hypoxia ([place])   Lung cancer, main bronchus [facility])    Patient Profile     74 y.o. female with history of afib, chronic respiratory faiure on 2 liters of oxygen, lung carcinoma admitted with several seek istory of increasing sob and lower extremety edema. She had tried diuretics at home with no improvement in her edema. CXR revealed posobstructive pneumonitis and increase in her lung mass. BNP is minimally elevated and improved from the past several weeks. Ruled out for an mi with normal troponin. Renal function normal.EKG afib with mildly increased vr at 103. LE ultrasound negative for dvt. Chest ct showed small left pleural effusion with progressive invasion of her perihilar mass into the left hilum and mediastinum.  Subjective   Still with shortness of breath and lower extremity edema  Inpatient Medications    . apixaban  5 mg Oral BID  . ascorbic acid  500 mg Oral Daily  . busPIRone  30 mg Oral BID  . diltiazem  360 mg Oral Daily  . escitalopram  20 mg Oral Daily  . fluticasone furoate-vilanterol  1 puff Inhalation Daily  . furosemide  40 mg Intravenous Daily  . furosemide  80 mg Intravenous Once  . magnesium oxide  200 mg Oral BID  . mouth rinse  15 mL Mouth Rinse BID  . metoprolol tartrate  50 mg Oral BID  . pantoprazole  40 mg Oral Daily  . polyethylene glycol  17 g Oral Daily  . pravastatin  20 mg Oral Daily  . rOPINIRole  3 mg Oral QHS  . sodium chloride flush  3 mL Intravenous Once  . sodium chloride flush  3 mL Intravenous Q12H  . sucralfate  1 g Oral TID  . umeclidinium bromide  1 puff Inhalation Daily    Vital Signs    Vitals:   08/06/19 2200 08/06/19 2230 08/06/19 2318 08/07/19 0534  BP: 118/60 (!) 106/58     Pulse:      Resp:      Temp:   98.2 F (36.8 C) 97.6 F (36.4 C)  TempSrc:   Oral Oral  SpO2:      Weight:      Height:        Intake/Output Summary (Last 24 hours) at 08/07/2019 7341 Last data filed at 08/06/2019 1756 Gross per 24 hour  Intake 10 ml  Output --  Net 10 ml   Filed Weights   08/06/19 1136  Weight: (!) 147 kg    Physical Exam    GEN: Well nourished, well developed, in no acute distress.  HEENT: normal.  Neck: Supple, no JVD, carotid bruits, or masses. Cardiac: Irregular irregular rhythm with distant heart sounds 4+ edema in lower extremities bilaterally. Respiratory: Decreased breath sounds bilaterally with rhonchi.Marland Kitchen GI: Soft, nontender, nondistended, BS + x 4. MS: no deformity or atrophy. Skin: warm and dry, no rash. Neuro:  Strength and sensation are intact. Psych: Normal affect.  Labs    CBC Recent Labs    08/06/19 1139 08/07/19 0500  WBC 11.5* 13.4*  HGB 10.8* 10.3*  HCT 34.6* 32.6*  MCV 89.6 88.1  PLT 236 937   Basic Metabolic Panel Recent Labs    08/06/19 1139 08/07/19  0500  NA 133* 132*  K 3.7 4.8  CL 93* 95*  CO2 30 27  GLUCOSE 114* 133*  BUN 15 15  CREATININE 0.63 0.51  CALCIUM 8.5* 8.7*  MG 1.7  --    Liver Function Tests Recent Labs    08/06/19 1139  AST 24  ALT 47*  ALKPHOS 67  BILITOT 0.7  PROT 6.4*  ALBUMIN 2.6*   No results for input(s): LIPASE, AMYLASE in the last 72 hours. Cardiac Enzymes No results for input(s): CKTOTAL, CKMB, CKMBINDEX, TROPONINI in the last 72 hours. BNP Recent Labs    08/06/19 1139  BNP 144.0*   D-Dimer No results for input(s): DDIMER in the last 72 hours. Hemoglobin A1C No results for input(s): HGBA1C in the last 72 hours. Fasting Lipid Panel No results for input(s): CHOL, HDL, LDLCALC, TRIG, CHOLHDL, LDLDIRECT in the last 72 hours. Thyroid Function Tests Recent Labs    08/07/19 0500  TSH 0.819    Telemetry    Atrial flutter  ECG    Probable atrial flutter with no  ischemia  Radiology    DG Chest 2 View  Result Date: 08/06/2019 CLINICAL DATA:  Increasing weakness since yesterday EXAM: CHEST - 2 VIEW COMPARISON:  07/19/2019 FINDINGS: Right-sided Port-A-Cath in satisfactory position. Bilateral perihilar opacities, left greater than right, unchanged compared with the prior examination of 07/19/2019. small left pleural effusion. No pneumothorax. Stable heart size. No acute osseous abnormality. IMPRESSION: Stable appearance of the left perihilar airspace opacity consistent with history of known malignancy. Stable posttreatment changes of the right hilum. Electronically Signed   By: Kathreen Devoid   On: 08/06/2019 12:16   DG Chest 2 View  Result Date: 07/09/2019 CLINICAL DATA:  Shortness of breath EXAM: CHEST - 2 VIEW COMPARISON:  06/28/2019 FINDINGS: Perihilar airspace opacities are again noted. These have not substantially changed since the prior study. No pneumothorax. No large pleural effusion. The Port-A-Cath is stable in positioning. Heart size is stable. Aortic calcifications are noted. IMPRESSION: 1. No acute cardiopulmonary process. 2. Stable appearance of the lungs as detailed above. Electronically Signed   By: Constance Holster M.D.   On: 07/09/2019 19:50   CT Chest W Contrast  Result Date: 08/05/2019 CLINICAL DATA:  Restaging lung cancer. EXAM: CT CHEST WITH CONTRAST TECHNIQUE: Multidetector CT imaging of the chest was performed during intravenous contrast administration. CONTRAST:  52mL OMNIPAQUE IOHEXOL 300 MG/ML  SOLN COMPARISON:  CT chest 04/12/19 FINDINGS: Cardiovascular: The heart size appears within normal limits. Aortic atherosclerosis. Three vessel coronary artery calcifications. No pericardial effusion. Mediastinum/Nodes: Normal appearance of the thyroid gland. The trachea appears patent and is midline. Normal appearance of the esophagus. -new left supraclavicular adenopathy is noted measuring up to 1.1 cm, image 16/2. -left pre-vascular lymph node  measures 1.3 cm, image 53/2. Previously 0.4 cm. -right paratracheal lymph node measures 1.3 cm, image 58/2. Previously 0.4 cm. Lungs/Pleura: Left lung perihilar mass has increased in size from previous exam. This measures 6.9 x 6.6 cm, image 68/2. Previously 5.2 x 3.6 cm. Progressive invasion of the left hilum and mediastinum is identified with narrowing of the right mainstem bronchus, image 65/2 progressive postobstructive pneumonitis within the left upper lobe and left lower lobe noted. Small left pleural effusion is also new from previous exam. Innumerable small pulmonary nodules are noted throughout the left lung which are nonspecific. Some of the small nodules have a halo of ground-glass attenuation. Findings may be related to postobstructive changes. Metastatic disease is not excluded. Post treatment  changes involving the right hilar region are stable. There are advanced changes of centrilobular and paraseptal emphysema identified within the right lung. Stable posterior right upper lobe lung nodule measuring 3 mm. New nodule within the posterolateral right upper lobe is identified measuring 5 mm, image 58/3. Upper Abdomen: Unchanged 8 mm dome of liver hypodensity. Left adrenal gland hyperplasia. Unchanged. Musculoskeletal: Superior endplate compression deformity involving the T7 vertebra is stable. No acute bone abnormality. IMPRESSION: 1. Interval progression of disease. 2. Increase in size of left perihilar mass with progressive invasion of the left hilum and mediastinum. 3. New left supraclavicular and right paratracheal adenopathy. 4. New small left pleural effusion. 5. Innumerable small nodules throughout the left lung are nonspecific in may be related to postobstructive changes. Metastatic disease not excluded. 6. Similar appearance of post treatment changes within the perihilar right lung. 7. A small nodule within the posterolateral right upper lobe is identified, new from previous exam. Cannot rule out  metastatic disease. 8. Emphysema and aortic atherosclerosis. 9. Three vessel coronary artery calcifications. Aortic Atherosclerosis (ICD10-I70.0) and Emphysema (ICD10-J43.9). Electronically Signed   By: Kerby Moors M.D.   On: 08/05/2019 15:19   CT Angio Chest PE W and/or Wo Contrast  Result Date: 07/09/2019 CLINICAL DATA:  Shortness of breath x2 months. EXAM: CT ANGIOGRAPHY CHEST WITH CONTRAST TECHNIQUE: Multidetector CT imaging of the chest was performed using the standard protocol during bolus administration of intravenous contrast. Multiplanar CT image reconstructions and MIPs were obtained to evaluate the vascular anatomy. CONTRAST:  33mL OMNIPAQUE IOHEXOL 350 MG/ML SOLN COMPARISON:  April 12, 2019 FINDINGS: Cardiovascular: Contrast injection is sufficient to demonstrate satisfactory opacification of the pulmonary arteries to the segmental level. There is no pulmonary embolus. The main pulmonary artery is within normal limits for size. There is no CT evidence of acute right heart strain. There are atherosclerotic changes of the visualized thoracic aorta without evidence for an aneurysm. Heart size is normal. Coronary artery calcifications are noted. Mediastinum/Nodes: --there is new mediastinal adenopathy. For example there is a pretracheal lymph node measuring approximately 1 cm (axial series 4, image 34). There is a precarinal lymph node measuring approximately 1.4 cm in the short axis (previously measuring less than 4 mm). Hilar adenopathy is noted. --No axillary lymphadenopathy. --there are few mildly prominent left supraclavicular lymph nodes measuring up to approximately 0.8 cm (axial series 4, image 7). These are new since prior CT. --Normal thyroid gland. --The esophagus is unremarkable Lungs/Pleura: There is a worsening ill-defined left hilar mass causing compression of the left mainstem bronchus as well as the left upper and left lower lobe bronchi. There is worsening postobstructive  atelectasis in the superior left lower lobe and left upper lobe. The right perihilar region is essentially stable in appearance from prior study. There are small bilateral pleural effusions, right greater than left. Moderate emphysematous changes are noted bilaterally. There are branching airspace opacities in the left upper lobe concerning for pneumonia. There are few sub solid airspace opacities in the medial right lower lobe which may represent postobstructive atelectasis or pneumonia. Upper Abdomen: No acute abnormality. Musculoskeletal: No chest wall abnormality. No acute or significant osseous findings. IMPRESSION: 1. No acute pulmonary embolism. 2. Postobstructive pneumonia in the left upper lobe. 3. Worsening postobstructive findings in the left upper and left lower lobes with worsening ill-defined soft tissue in the left hilum is concerning for disease progression. Pulmonary medicine follow-up is recommended. 4. Worsening mediastinal, hilar, and left supraclavicular adenopathy. While this may be reactive, nodal  metastatic disease is not excluded. 5. Trace to small bilateral pleural effusions. Aortic Atherosclerosis (ICD10-I70.0) and Emphysema (ICD10-J43.9). Electronically Signed   By: Constance Holster M.D.   On: 07/09/2019 22:09   MR BRAIN W WO CONTRAST  Result Date: 07/14/2019 CLINICAL DATA:  74 year old female with right lung cancer, non-small cell. Suspicion of disease progression in the chest recently. Staging. EXAM: MRI HEAD WITHOUT AND WITH CONTRAST TECHNIQUE: Multiplanar, multiecho pulse sequences of the brain and surrounding structures were obtained without and with intravenous contrast. CONTRAST:  79mL GADAVIST GADOBUTROL 1 MMOL/ML IV SOLN COMPARISON:  Noncontrast head CTs 06/27/2017 and earlier. FINDINGS: Brain: No restricted diffusion to suggest acute infarction. No midline shift, mass effect, evidence of mass lesion, ventriculomegaly, extra-axial collection or acute intracranial  hemorrhage. Cervicomedullary junction and pituitary are within normal limits. No abnormal enhancement identified. There is motion artifact on the postcontrast images. No dural thickening. Scattered nonspecific cerebral white matter T2 and FLAIR hyperintensity, mild to moderate for age. None resembles vasogenic edema. Similar patchy T2 hyperintensity in the pons. No cortical encephalomalacia or chronic cerebral blood products identified. The deep gray nuclei and cerebellum appear negative. Vascular: Major intracranial vascular flow voids are preserved. Skull and upper cervical spine: Visualized bone marrow signal is within normal limits. Sinuses/Orbits: Postoperative changes to both globes, otherwise negative orbits. Paranasal sinuses are clear. Other: Mild bilateral mastoid effusions. Visible internal auditory structures appear normal. Scalp and face soft tissues appear negative. IMPRESSION: 1. No metastatic disease or acute intracranial abnormality identified. 2. Mild to moderate for age signal changes in the cerebral white matter and pons, most commonly due to chronic small vessel disease. Electronically Signed   By: Genevie Ann M.D.   On: 07/14/2019 23:51   US Venous Img Lower Bilateral  Result Date: 08/06/2019 CLINICAL DATA:  74 year old female with a history of lower extremity swelling EXAM: BILATERAL LOWER EXTREMITY VENOUS DOPPLER ULTRASOUND TECHNIQUE: Gray-scale sonography with graded compression, as well as color Doppler and duplex ultrasound were performed to evaluate the lower extremity deep venous systems from the level of the common femoral vein and including the common femoral, femoral, profunda femoral, popliteal and calf veins including the posterior tibial, peroneal and gastrocnemius veins when visible. The superficial great saphenous vein was also interrogated. Spectral Doppler was utilized to evaluate flow at rest and with distal augmentation maneuvers in the common femoral, femoral and popliteal  veins. COMPARISON:  None. FINDINGS: RIGHT LOWER EXTREMITY Common Femoral Vein: No evidence of thrombus. Normal compressibility, respiratory phasicity and response to augmentation. Saphenofemoral Junction: No evidence of thrombus. Normal compressibility and flow on color Doppler imaging. Profunda Femoral Vein: No evidence of thrombus. Normal compressibility and flow on color Doppler imaging. Femoral Vein: No evidence of thrombus. Normal compressibility, respiratory phasicity and response to augmentation. Popliteal Vein: No evidence of thrombus. Normal compressibility, respiratory phasicity and response to augmentation. Calf Veins: No evidence of thrombus. Normal compressibility and flow on color Doppler imaging. Superficial Great Saphenous Vein: No evidence of thrombus. Normal compressibility and flow on color Doppler imaging. Other Findings:  None. LEFT LOWER EXTREMITY Common Femoral Vein: No evidence of thrombus. Normal compressibility, respiratory phasicity and response to augmentation. Saphenofemoral Junction: No evidence of thrombus. Normal compressibility and flow on color Doppler imaging. Profunda Femoral Vein: No evidence of thrombus. Normal compressibility and flow on color Doppler imaging. Femoral Vein: No evidence of thrombus. Normal compressibility, respiratory phasicity and response to augmentation. Popliteal Vein: No evidence of thrombus. Normal compressibility, respiratory phasicity and response to augmentation. Calf Veins:  No evidence of thrombus. Normal compressibility and flow on color Doppler imaging. Superficial Great Saphenous Vein: No evidence of thrombus. Normal compressibility and flow on color Doppler imaging. Other Findings:  None. IMPRESSION: Sonographic survey of the bilateral lower extremities negative for DVT Electronically Signed   By: Corrie Mckusick D.O.   On: 08/06/2019 15:52   DG Chest Portable 1 View  Result Date: 07/19/2019 CLINICAL DATA:  Shortness of breath. Lung cancer and  COPD. EXAM: PORTABLE CHEST 1 VIEW COMPARISON:  07/09/2019 FINDINGS: Unchanged position of right chest wall Port-A-Cath. Bilateral parahilar opacities are unchanged. Opacities in the right lung base are slightly increased. No pleural effusion or pneumothorax. IMPRESSION: Slightly increased opacities in the right lung base may indicate developing consolidation. Otherwise unchanged radiograph. Electronically Signed   By: Ulyses Jarred M.D.   On: 07/19/2019 02:02    Assessment & Plan     74 y.o. female with history of afib, chronic respiratory faiure on 2 liters of oxygen, lung carcinoma admitted with several seek istory of increasing sob and lower extremety edema. She had tried diuretics at home with no improvement in her edema. CXR revealed posobstructive pneumonitis and increase in her lung mass.  1. Lung carcinoma-being followed by oncology.  CT of the chest suggested possible progression.  May be partially the etiology of her lower extremity edema and should shortness of breath.   2. CHF-Echo from this visit pending today.  Echo as outpatient less than 1 month ago showed ejection fraction of 40% with no high-grade valvular disease.  We will continue careful diuresis.  We will repeat BNP today.  We will follow I's and O's.  Will need to follow hemodynamics.  Renal function remained stable.  Does not appear to have florid or significant increase in pulmonary edema by chest x-ray.  BNP has decreased from recent previous studies.  Will evaluate for any change on today's findings.   3. Afib-rate is fairly well controlled on Cardizem and metoprolol.  We will continue with Cardizem at 360 mg daily, metoprolol tartrate 50 mg twice daily and Eliquis at 5 mg twice daily.  Control appears to be a good option given her pulmonary disease.  Doubt conversion would be helpful.   Signed, Javier Docker Izza Bickle MD 08/07/2019, 8:21 AM  Pager: [phone]

## 2019-08-08 DIAGNOSIS — J9611 Chronic respiratory failure with hypoxia: Secondary | ICD-10-CM

## 2019-08-08 DIAGNOSIS — C3402 Malignant neoplasm of left main bronchus: Secondary | ICD-10-CM

## 2019-08-08 LAB — CBC
HCT: 33.9 % — ABNORMAL LOW (ref 36.0–46.0)
Hemoglobin: 10.6 g/dL — ABNORMAL LOW (ref 12.0–15.0)
MCH: 27.7 pg (ref 26.0–34.0)
MCHC: 31.3 g/dL (ref 30.0–36.0)
MCV: 88.7 fL (ref 80.0–100.0)
Platelets: 261 10*3/uL (ref 150–400)
RBC: 3.82 MIL/uL — ABNORMAL LOW (ref 3.87–5.11)
RDW: 16.3 % — ABNORMAL HIGH (ref 11.5–15.5)
WBC: 12.5 10*3/uL — ABNORMAL HIGH (ref 4.0–10.5)
nRBC: 0 % (ref 0.0–0.2)

## 2019-08-08 LAB — BASIC METABOLIC PANEL
Anion gap: 10 (ref 5–15)
BUN: 21 mg/dL (ref 8–23)
CO2: 29 mmol/L (ref 22–32)
Calcium: 8.5 mg/dL — ABNORMAL LOW (ref 8.9–10.3)
Chloride: 94 mmol/L — ABNORMAL LOW (ref 98–111)
Creatinine, Ser: 0.69 mg/dL (ref 0.44–1.00)
GFR calc Af Amer: >60 mL/min (ref >60)
GFR calc non Af Amer: >60 mL/min (ref >60)
Glucose, Bld: 98 mg/dL (ref 70–99)
Potassium: 4.4 mmol/L (ref 3.5–5.1)
Sodium: 133 mmol/L — ABNORMAL LOW (ref 135–145)

## 2019-08-08 LAB — ECHOCARDIOGRAM COMPLETE
Height: 70 in
Weight: 5184 oz

## 2019-08-08 LAB — MAGNESIUM: Magnesium: 2.1 mg/dL (ref 1.7–2.4)

## 2019-08-08 LAB — PHOSPHORUS: Phosphorus: 4.1 mg/dL (ref 2.5–4.6)

## 2019-08-08 MED ORDER — FUROSEMIDE 10 MG/ML IJ SOLN
40.0000 mg | Freq: Two times a day (BID) | INTRAMUSCULAR | Status: DC
  Administered 2019-08-08 – 2019-08-09 (×2): 40 mg via INTRAVENOUS
  Filled 2019-08-08 (×2): qty 4

## 2019-08-08 NOTE — Consult Note (Signed)
Reason for Consult: Recurrent left lung mass. Referring Physician: Val Riles, MD  Cathy Watts is an 74 y.o. female.  HPI: Cathy Watts is a 74 year old recent former smoker whom I recently evaluated on a prior admission for left mainstem bronchus narrowing related to hilar mass.  She presented on 2 April with increasing dyspnea and lower extremity edema and a recent 10 pound weight loss.  She has been diuresed and now feels better.  Previously we had recommended interventional bronchoscopy namely bronchoscopic evaluation, biopsies as needed and evaluation for balloon dilation, cryotherapy and stent placement.  However, in the interim the patient developed COVID-19 and her respiratory status worsened.  She is now recovering from this however remains with all other issues as noted.  Also in the interim in less than a month's time her chest CT has shown significant increase size of the left hilar mass that was impinging on the left mainstem bronchus.  This is suggestive of recurrence of neuroendocrine carcinoma.  The patient's respiratory status currently is tenuous and I do not believe that she can tolerate general anesthesia without having a prolonged postoperative ventilator requirement post procedure.  This would defeat the purpose of performing an otherwise diagnostic/PALLIATIVE procedure.  I have discussed all of the above with the patient and she is understanding.  Currently she states that her dyspnea is markedly improved.  She has not had chest pain.  Cough is at baseline with no sputum production.  No hemoptysis.  She has not had a fever.  No chills or sweats.  She voices no other complaint.  Just anxious to go home.     Past Medical History:  Diagnosis Date  . Abdominal aortic aneurysm (AAA) 3.0 cm to 5.0 cm in diameter in female [facility]) 05/2018   being followed by dr. Delana Meyer. will reultrasound in 1 year  . Anxiety   . Asthma   . Barrett's esophagus   . COPD (chronic obstructive  pulmonary disease) ([place])   . Depression   . GERD (gastroesophageal reflux disease)   . History of peptic ulcer disease   . Hypercholesterolemia   . Low oxygen saturation    2l hs  . Malignant neoplasm of upper lobe of left lung ([place]) 04/2019  . Osteopenia   . Panic disorder   . Personal history of radiation therapy   . Personal history of tobacco use, presenting hazards to health 01/16/2015  . Requires supplemental oxygen 05/2018   supposed to use 2L NP at night but she currently does not.  encouraged patient to resume this  . Sleep apnea    snores significantly but has never been tested for OSA  . SOB (shortness of breath)   . 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 CERVICAL CONE BIOPSY  1990  . CHOLECYSTECTOMY  1980's  . colonoscopy  2014  . COLONOSCOPY  07/21/2012  . COLONOSCOPY WITH PROPOFOL N/A 05/12/2018   Procedure: COLONOSCOPY WITH PROPOFOL;  Surgeon: Lollie Sails, MD;  Location: [facility] ENDOSCOPY;  Service: Endoscopy;  Laterality: N/A;  . ELBOW FRACTURE SURGERY Right 2009  . ENDOBRONCHIAL ULTRASOUND N/A 02/07/2015   Procedure: ENDOBRONCHIAL ULTRASOUND;  Surgeon: Flora Lipps, MD;  Location: [facility] ORS;  Service: Cardiopulmonary;  Laterality: N/A;  . ENDOBRONCHIAL ULTRASOUND Left 06/05/2018   Procedure: ENDOBRONCHIAL ULTRASOUND;  Surgeon: Flora Lipps, MD;  Location: [facility] ORS;  Service: Cardiopulmonary;  Laterality: Left;  .  ESOPHAGOGASTRODUODENOSCOPY (EGD) WITH PROPOFOL N/A 02/26/2016   Procedure: ESOPHAGOGASTRODUODENOSCOPY (EGD) WITH PROPOFOL;  Surgeon: Lollie Sails, MD;  Location: [facility] ENDOSCOPY;  Service: Endoscopy;  Laterality: N/A;  COPD, Sleep Apnea  . ESOPHAGOGASTRODUODENOSCOPY (EGD) WITH PROPOFOL N/A 05/12/2018   Procedure: ESOPHAGOGASTRODUODENOSCOPY (EGD) WITH PROPOFOL;  Surgeon: Lollie Sails, MD;  Location: [facility] ENDOSCOPY;  Service: Endoscopy;  Laterality: N/A;  . EYE  SURGERY Bilateral    cataract extractions  . FRACTURE SURGERY  2005   elbow repair  . PORTA CATH INSERTION N/A 06/17/2018   Procedure: PORTA CATH INSERTION;  Surgeon: Katha Cabal, MD;  Location: [place] CV LAB;  Service: Cardiovascular;  Laterality: N/A;  . TONSILLECTOMY  1979  . TUBAL LIGATION  1970's  . UPPER GI ENDOSCOPY      Family History  Problem Relation Age of Onset  . Colon cancer Mother        colon  . Diabetes Mother   . Arthritis Mother   . Glaucoma Mother   . Stomach cancer Father        stomach  . Throat cancer Brother        throat  . Breast cancer Sister        breast  . Diabetes Sister   . Cirrhosis Sister   . Cancer Paternal Aunt   . Parkinson's disease Sister   . Heart attack Sister   . Ovarian cancer Sister     Social History:  reports that she quit smoking about 21 months ago. Her smoking use included cigarettes. She has a 55.00 pack-year smoking history. She has never used smokeless tobacco. She reports current alcohol use. She reports that she does not use drugs.  Allergies:  Allergies  Allergen Reactions  . Amoxicillin Hives and Itching    Did it involve swelling of the face/tongue/throat, SOB, or low BP? No Did it involve sudden or severe rash/hives, skin peeling, or any reaction on the inside of your mouth or nose? No Did you need to seek medical attention at a hospital or doctor's office? No When did it last happen?2018 If all above answers are "NO", may proceed with cephalosporin use.     . Chlorhexidine Dermatitis    Patient refused    Medications: I have reviewed the patient's current medications.  Results for orders placed or performed during the hospital encounter of 08/06/19 (from the past 48 hour(s))  TSH     Status: None   Collection Time: 08/07/19  5:00 AM  Result Value Ref Range   TSH 0.819 0.350 - 4.500 uIU/mL    Comment: Performed by a 3rd Generation assay with a functional sensitivity of <=0.01  uIU/mL. Performed at [facility], [address] [place], [place] [address]   Basic metabolic panel     Status: Abnormal   Collection Time: 08/07/19  5:00 AM  Result Value Ref Range   Sodium 132 (L) 135 - 145 mmol/L   Potassium 4.8 3.5 - 5.1 mmol/L   Chloride 95 (L) 98 - 111 mmol/L   CO2 27 22 - 32 mmol/L   Glucose, Bld 133 (H) 70 - 99 mg/dL    Comment: Glucose reference range applies only to samples taken after fasting for at least 8 hours.   BUN 15 8 - 23 mg/dL   Creatinine, Ser 0.51 0.44 - 1.00 mg/dL   Calcium 8.7 (L) 8.9 - 10.3 mg/dL   GFR calc non Af Amer >60 >60 mL/min   GFR calc Af Amer >  60 >60 mL/min   Anion gap 10 5 - 15    Comment: Performed at [facility], [address] [place], [place] [address]  CBC     Status: Abnormal   Collection Time: 08/07/19  5:00 AM  Result Value Ref Range   WBC 13.4 (H) 4.0 - 10.5 K/uL   RBC 3.70 (L) 3.87 - 5.11 MIL/uL   Hemoglobin 10.3 (L) 12.0 - 15.0 g/dL   HCT 32.6 (L) 36.0 - 46.0 %   MCV 88.1 80.0 - 100.0 fL   MCH 27.8 26.0 - 34.0 pg   MCHC 31.6 30.0 - 36.0 g/dL   RDW 16.0 (H) 11.5 - 15.5 %   Platelets 229 150 - 400 K/uL   nRBC 0.0 0.0 - 0.2 %    Comment: Performed at [facility], [address] [place], [place] [address]  Brain natriuretic peptide     Status: Abnormal   Collection Time: 08/07/19  5:00 AM  Result Value Ref Range   B Natriuretic Peptide 230.0 (H) 0.0 - 100.0 pg/mL    Comment: Performed at [facility], [address] [place], [place] [address]  Glucose, capillary     Status: Abnormal   Collection Time: 08/07/19  8:25 AM  Result Value Ref Range   Glucose-Capillary 124 (H) 70 - 99 mg/dL    Comment: Glucose reference range applies only to samples taken after fasting for at least 8 hours.  Basic metabolic panel     Status: Abnormal   Collection Time: 08/08/19  5:46 AM  Result Value Ref Range   Sodium 133 (L) 135 - 145 mmol/L   Potassium 4.4 3.5 - 5.1 mmol/L   Chloride  94 (L) 98 - 111 mmol/L   CO2 29 22 - 32 mmol/L   Glucose, Bld 98 70 - 99 mg/dL    Comment: Glucose reference range applies only to samples taken after fasting for at least 8 hours.   BUN 21 8 - 23 mg/dL   Creatinine, Ser 0.69 0.44 - 1.00 mg/dL   Calcium 8.5 (L) 8.9 - 10.3 mg/dL   GFR calc non Af Amer >60 >60 mL/min   GFR calc Af Amer >60 >60 mL/min   Anion gap 10 5 - 15    Comment: Performed at [facility], [address] [place], [place] [address]  CBC     Status: Abnormal   Collection Time: 08/08/19  5:46 AM  Result Value Ref Range   WBC 12.5 (H) 4.0 - 10.5 K/uL   RBC 3.82 (L) 3.87 - 5.11 MIL/uL   Hemoglobin 10.6 (L) 12.0 - 15.0 g/dL   HCT 33.9 (L) 36.0 - 46.0 %   MCV 88.7 80.0 - 100.0 fL   MCH 27.7 26.0 - 34.0 pg   MCHC 31.3 30.0 - 36.0 g/dL   RDW 16.3 (H) 11.5 - 15.5 %   Platelets 261 150 - 400 K/uL   nRBC 0.0 0.0 - 0.2 %    Comment: Performed at [facility], [address], [place], [place] [address]  Magnesium     Status: None   Collection Time: 08/08/19  5:46 AM  Result Value Ref Range   Magnesium 2.1 1.7 - 2.4 mg/dL    Comment: Performed at [facility], [address], [place], [place] [address]  Phosphorus     Status: None   Collection Time: 08/08/19  5:46 AM  Result Value Ref Range   Phosphorus 4.1 2.5 - 4.6 mg/dL  Comment: Performed at [facility], [address] [place], [place] [address]    ECHOCARDIOGRAM COMPLETE  Result Date: 08/08/2019    ECHOCARDIOGRAM REPORT   Patient Name:   Cathy Watts Date of Exam: 08/07/2019 Medical Rec #:  [id number]         Height:       70.0 in Accession #:    [id number]        Weight:       324.0 lb Date of Birth:  1945-11-12        BSA:          2.562 m Patient Age:    54 years          BP:           119/85 mmHg Patient Gender: F                 HR:           107 bpm. Exam Location:  [facility] Procedure: 2D Echo and Intracardiac Opacification Agent Indications:     Atrial Fibrillation  427.31/ I48.91  History:         Patient has no prior history of Echocardiogram examinations.  Sonographer:     Arville Go RDCS Referring Phys:  [id number] AGBATA Diagnosing Phys: Bartholome Bill MD  Sonographer Comments: Technically challenging study due to limited acoustic windows, Technically difficult study due to poor echo windows and patient is morbidly obese. Image acquisition challenging due to patient body habitus. IMPRESSIONS  1. Left ventricular ejection fraction, by estimation, is 50 to 55%. The left ventricle has low normal function. The left ventricle has no regional wall motion abnormalities. Left ventricular diastolic parameters were normal.  2. Right ventricular systolic function was not well visualized. The right ventricular size is mildly enlarged.  3. The mitral valve was not well visualized. Trivial mitral valve regurgitation.  4. The aortic valve was not well visualized. Aortic valve regurgitation is not visualized. No aortic stenosis is present. FINDINGS  Left Ventricle: Left ventricular ejection fraction, by estimation, is 50 to 55%. The left ventricle has low normal function. The left ventricle has no regional wall motion abnormalities. Definity contrast agent was given IV to delineate the left ventricular endocardial borders. The left ventricular internal cavity size was normal in size. There is no left ventricular hypertrophy. Left ventricular diastolic parameters were normal. Right Ventricle: The right ventricular size is mildly enlarged. No increase in right ventricular wall thickness. Right ventricular systolic function was not well visualized. Left Atrium: Left atrial size was normal in size. Right Atrium: Right atrial size was normal in size. Pericardium: There is no evidence of pericardial effusion. Mitral Valve: The mitral valve was not well visualized. Trivial mitral valve regurgitation. Tricuspid Valve: The tricuspid valve is not well visualized. Tricuspid valve  regurgitation is trivial. Aortic Valve: The aortic valve was not well visualized. Aortic valve regurgitation is not visualized. No aortic stenosis is present. Aortic valve peak gradient measures 9.6 mmHg. Pulmonic Valve: The pulmonic valve was not well visualized. Pulmonic valve regurgitation is trivial. Aorta: The aortic root is normal in size and structure. IAS/Shunts: The interatrial septum was not assessed.  LEFT VENTRICLE PLAX 2D LVIDd:         4.32 cm  Diastology LVIDs:         3.21 cm  LV e' lateral:   11.00 cm/s LV PW:         1.38 cm  LV E/e' lateral: 9.2  LV IVS:        1.31 cm  LV e' medial:    8.49 cm/s LVOT diam:     2.10 cm  LV E/e' medial:  11.9 LV SV:         56 LV SV Index:   22 LVOT Area:     3.46 cm  RIGHT VENTRICLE RV Basal diam:  3.06 cm RV S prime:     14.00 cm/s TAPSE (M-mode): 2.1 cm LEFT ATRIUM             Index LA diam:        3.90 cm 1.52 cm/m LA Vol (A2C):   47.1 ml 18.38 ml/m LA Vol (A4C):   60.6 ml 23.65 ml/m LA Biplane Vol: 55.5 ml 21.66 ml/m  AORTIC VALVE                PULMONIC VALVE AV Area (Vmax): 2.06 cm    PV Vmax:       1.06 m/s AV Vmax:        155.00 cm/s PV Peak grad:  4.5 mmHg AV Peak Grad:   9.6 mmHg LVOT Vmax:      92.40 cm/s LVOT Vmean:     65.100 cm/s LVOT VTI:       0.163 m  AORTA Ao Root diam: 2.90 cm Ao Asc diam:  3.20 cm MITRAL VALVE                TRICUSPID VALVE MV Area (PHT): 6.71 cm     TV Peak grad:   27.0 mmHg MV Decel Time: 113 msec     TV Vmax:        2.60 m/s MV E velocity: 101.00 cm/s                             SHUNTS                             Systemic VTI:  0.16 m                             Systemic Diam: 2.10 cm Bartholome Bill MD Electronically signed by Bartholome Bill MD Signature Date/Time: 08/08/2019/9:00:54 AM    Final     Review of Systems  A 10 point review of systems was performed and it is as noted above otherwise negative. Blood pressure 94/64, pulse 88, temperature 97.7 F (36.5 C), temperature source Oral, resp. rate 20, height 5\' 10"   (1.778 m), weight (!) 147 kg, SpO2 91 %. Physical Exam GENERAL:Obese woman, lying in bed, comfortable with nasal cannula.  Very mild conversational dyspnea.  No overt distress. HEAD: Normocephalic, atraumatic.  EYES: Pupils equal, round, reactive to light.  No scleral icterus.  MOUTH: Oral mucosa moist, no thrush. NECK: Supple. No thyromegaly. No nodules. No JVD.  PULMONARY: Good air entry on the right lung with coarse breath sounds and few rhonchi no other adventitious sounds.  Diminished breath sounds on the left the previously noted neck wheeze on the left is not audible today. CARDIOVASCULAR: S1 and S2. Regular rate and rhythm.  No rubs, murmurs or gallops appreciated. GASTROINTESTINAL: MUSCULOSKELETAL: No joint deformity, no clubbing, 2+ pitting edema lower extremities. NEUROLOGIC: No focal deficits noted.  Speech is fluent. SKIN: Intact,warm,dry.  No rashes. PSYCH: Mood, behavior and affect are appropriate.  Representative images  from CT scan of the chest performed 05 August 2019, independently reviewed:        Assessment/Plan:  Dyspnea/shortness of breath Compression of the left mainstem bronchus Rapidly increasing left hilar mass This is consistent with recurrence of neuroendocrine carcinoma Patient's respiratory status has had multiple setbacks, she continues to have significant dyspnea Concern that she would not do well with general anesthesia and require prolonged mechanical ventilation afterwards Discussed the case with Dr. Mike Gip and agree with proceeding with chemotherapy if indicated If additional tissue is desired possibility would be to get IR to sample left supraclavicular node, this approach would not require general anesthesia, however, clinical picture is that of recurrent neuroendocrine tumor Long-term prognosis in this regard is poor and interventions from this point forward should be purely palliative   I will discuss the case with Dr. Rogue Bussing in the  morning.  Thank you for allowing me to participate in this patient's care.   Renold Don, MD [facility] [facility] 08/08/2019, 8:23 PM   *This note was dictated using voice recognition software/Dragon.  Despite best efforts to proofread, errors can occur which can change the meaning.  Any change was purely unintentional.

## 2019-08-08 NOTE — Plan of Care (Signed)
Discussed with patient plan of care for the evening, pain management and snacks with bedtime medications with some teach back displayed

## 2019-08-08 NOTE — Progress Notes (Addendum)
Full consult note to follow.  Patient seen and examined.  Currently do not believe that she can tolerate general anesthesia for interventional bronchoscopy.  She has had rapid growth of the left hilar lesion and this is consistent with recurrent cancer and not with post radiation changes.  She has follow-up appointment with her primary oncologist Dr. Rogue Bussing on Tuesday.  I recommend that she make that that follow-up.  I discussed the case with Dr. Mike Gip who saw the patient in consultation and believe that the best course of action would be to treat the neuroendocrine tumor with chemotherapy and additional radiation if feasible.  If additional tissue is desired for corroboration of diagnosis, consultation with interventional radiology may be reasonable to see if left supraclavicular node can be sampled which would be the least invasive means.  Renold Don, MD [place] [facility]

## 2019-08-08 NOTE — Progress Notes (Signed)
[facility] Progress Note  Patient: Cathy Watts    UEK:[id number]  DOA: 08/06/2019     Date of Service: the patient was seen and examined on 08/08/2019  Chief Complaint  Patient presents with  . Chest Pain  . Weakness   Brief hospital course: [name] is a 74 y.o. female with medical history significant for anxiety, atrial fibrillation, chronic respiratory failure on 2 L of oxygen, COPD and lung cancer who presents to the emergency room for evaluation of a 2-week history of chest pain, worsening shortness of breath and bilateral lower extremity swelling.  Chest pain occurs both at rest and with exertion she denies having any nausea, vomiting, palpitations or diaphoresis. She is on diuretics at home and her dosages have been increased without any improvement in her lower extremity swelling. Per daughter she has gained 10lbs in 2 weeks  She has a cough which is chronic.  She denies having any fever or chills  ED Course: Patient with a history of lung cancer without any treatment who presents to the emergency room for evaluation of lower extremity swelling and worsening shortness of breath.  Imaging shows worsening postobstructive pneumonitis and increase in the size of the lung mass.  She will be admitted for further evaluation. Patient received Solu-Medrol and bronchodilators in the emergency room.  Lasix was ordered but not administered due to relative hypotension   Currently further plan is to continue current treatment, follow cardiologist and oncologist  Assessment and Plan: Principal Problem:   Lung cancer ([place]) Active Problems:   COPD, moderate ([place])   Depression   Atrial fibrillation with RVR ([place])   Chronic respiratory failure with hypoxia ([place])   Lung cancer, main bronchus ([place])   Acute CHF (congestive heart failure) ([place])     # Lung cancer  Patient presents for evaluation of worsening shortness of breath She had a CT scan of the chest done which showed  interval progression of disease with increase in size of left perihilar mass with progressive invasion of the left hilum and mediastinum.New left supraclavicular and right paratracheal adenopathy. New small left pleural effusion. Innumerable small nodules throughout the left lung arenonspecific in may be related to postobstructive changes. Metastaticdisease not excluded. Emphysema and aortic atherosclerosis. Three vessel coronary artery calcifications. Symptoms may likely be secondary to worsening lung cancer, unlikely to be secondary to congestive heart failure Oncology consult: (Patient was seen by Dr Patsey Berthold who was planning bronchoscopy (possible EBUS) and biopsy to document recurrence vs radiation induced scarring.                         In addition, discussion was held about possible cryotherapy, balloon dilatation and endobronchial stent placement in the left mainstem bronchus. Anticipate procedure once cleared of COVID-19.) Patient will need chemotherapy and may need concurrent immunotherapy.  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 4/4 completed 21 days off isolation after being Covid positive.  Patient can come off of the droplet contact precaution tomorrow a.m.   # Congestive heart failure Etiology unclear Patient has a 10lb weight gain in about 2 weeks Lower extremity edema, venous duplex negative for DVT 08/06/2019 TTE shows LVEF 50 to 55% LV wall motion  and systolic and diastolic function 4/4 incraesed Lasix 40 mg IV BID Ace wrap bilateral lower extremities and keep legs elevated Cardiology following   # Atrial fibrillation Continue Eliquis as primary prophylaxis for an  acute stroke Rate control with diltiazem, and metoprolol 50 twice daily   # Depression Continue Lexapro and BuSpar, Klonopin 0.25 twice daily  # Restless leg syndrome, -on Requip   # GERD, -Carafate and PPI therapy   # Dyslipidemia, -on pravchol   Body mass index is 46.49 kg/m.  Morbid obesity Interventions: Calorie restricted diet and daily exercise advised to lose body weight    Diet: Cardiac diet DVT Prophylaxis: Eliquis  Advance goals of care discussion: Full code  Family Communication: No family was present at bedside, at the time of interview.  The pt provided permission to discuss medical plan with the family. Opportunity was given to ask question and all questions were answered satisfactorily.   Disposition:  Pt is from home, admitted with shortness of breath and lower extremity edema, still has shortness of breath and edema of lower extremities, which precludes a safe discharge. Discharge to home, when edema improves and shortness of breath improves. Awaiting for clearance from oncologist and pulmonologist??  If no intervention needed then patient can be discharged home tomorrow 08/09/19 on oral Lasix to follow-up as an outpatient   Subjective: Patient was seen and examined at bedside, no overnight issues.  Patient is feeling improvement in the shortness of breath, still has 4+ edema of lower extremities, feels improvement in the lower extremity edema, patient was able to wear stocking in the right leg but not in the left leg   Physical Exam: General:  alert oriented to time, place, and person.  Appear in mild distress, affect appropriate Eyes: PERRLA ENT: Oral Mucosa Clear, moist  Neck: no JVD,  Cardiovascular: S1 and S2 Present, no Murmur,  Respiratory: good respiratory effort, Bilateral Air entry equal and Decreased, mild Crackles, mild  wheezes Abdomen: Bowel Sound present, Soft and no tenderness,  Skin: no rashes Extremities: 4+ Pedal edema, no calf  tenderness Neurologic: without any new focal findings Gait not checked due to patient safety concerns  Vitals:   08/08/19 0400 08/08/19 0500 08/08/19 0600 08/08/19 0830  BP:    94/64  Pulse: 64 68 66 88  Resp: 16 17 16 20   Temp:    97.7 F (36.5 C)  TempSrc:    Oral  SpO2: 90% 92% 90% 91%  Weight:      Height:        Intake/Output Summary (Last 24 hours) at 08/08/2019 1337 Last data filed at 08/08/2019 0830 Gross per 24 hour  Intake 730 ml  Output --  Net 730 ml   Filed Weights   08/06/19 1136  Weight: (!) 147 kg    Data Reviewed: I have personally reviewed and interpreted daily labs, tele strips, imagings as discussed above. I reviewed all nursing notes, pharmacy notes, vitals, pertinent old records I have discussed plan of care as described above with RN and patient/family.  CBC: Recent Labs  Lab 08/06/19 1139 08/07/19 0500 08/08/19 0546  WBC 11.5* 13.4* 12.5*  HGB 10.8* 10.3* 10.6*  HCT 34.6* 32.6* 33.9*  MCV 89.6 88.1 88.7  PLT 236 229 371   Basic Metabolic Panel: Recent Labs  Lab 08/06/19 1139 08/07/19 0500 08/08/19 0546  NA 133* 132* 133*  K 3.7 4.8 4.4  CL 93* 95* 94*  CO2 30 27 29   GLUCOSE 114* 133* 98  BUN 15 15 21   CREATININE 0.63 0.51 0.69  CALCIUM 8.5* 8.7* 8.5*  MG 1.7  --  2.1  PHOS  --   --  4.1    Studies: No results found.  Scheduled Meds: . apixaban  5 mg Oral BID  . ascorbic acid  500 mg Oral Daily  . busPIRone  30 mg Oral BID  . diltiazem  360 mg Oral Daily  . escitalopram  20 mg Oral Daily  . fluticasone furoate-vilanterol  1 puff Inhalation Daily  . furosemide  40 mg Intravenous Daily  . furosemide  80 mg Intravenous Once  . magnesium oxide  200 mg Oral BID  . mouth rinse  15 mL Mouth Rinse BID  . metoprolol tartrate  50 mg Oral BID  . pantoprazole  40 mg Oral Daily  . polyethylene glycol  17 g Oral Daily  . pravastatin  20 mg Oral Daily  . rOPINIRole  3 mg Oral QHS  . sodium chloride flush  3 mL Intravenous Once  .  sodium chloride flush  3 mL Intravenous Q12H  . sucralfate  1 g Oral TID  . umeclidinium bromide  1 puff Inhalation Daily   Continuous Infusions: . sodium chloride     PRN Meds: sodium chloride, acetaminophen **OR** acetaminophen, clonazePAM, ipratropium-albuterol, ondansetron **OR** ondansetron (ZOFRAN) IV, sodium chloride flush  Time spent: 35 minutes  Author: Val Riles. MD [facility] 08/08/2019 1:37 PM  To reach On-call, see care teams to locate the attending and reach out to them via [email or web address]. If 7PM-7AM, please contact night-coverage If you still have difficulty reaching the attending provider, please page the [facility] (Director on Call) for [facility] on amion for assistance.

## 2019-08-09 ENCOUNTER — Other Ambulatory Visit: Payer: Self-pay | Admitting: Internal Medicine

## 2019-08-09 DIAGNOSIS — J441 Chronic obstructive pulmonary disease with (acute) exacerbation: Secondary | ICD-10-CM

## 2019-08-09 DIAGNOSIS — F3341 Major depressive disorder, recurrent, in partial remission: Secondary | ICD-10-CM

## 2019-08-09 LAB — CBC
HCT: 36.2 % (ref 36.0–46.0)
Hemoglobin: 11.5 g/dL — ABNORMAL LOW (ref 12.0–15.0)
MCH: 27.6 pg (ref 26.0–34.0)
MCHC: 31.8 g/dL (ref 30.0–36.0)
MCV: 87 fL (ref 80.0–100.0)
Platelets: 319 10*3/uL (ref 150–400)
RBC: 4.16 MIL/uL (ref 3.87–5.11)
RDW: 16.4 % — ABNORMAL HIGH (ref 11.5–15.5)
WBC: 11.4 10*3/uL — ABNORMAL HIGH (ref 4.0–10.5)
nRBC: 0 % (ref 0.0–0.2)

## 2019-08-09 LAB — BASIC METABOLIC PANEL
Anion gap: 11 (ref 5–15)
BUN: 18 mg/dL (ref 8–23)
CO2: 28 mmol/L (ref 22–32)
Calcium: 8.8 mg/dL — ABNORMAL LOW (ref 8.9–10.3)
Chloride: 93 mmol/L — ABNORMAL LOW (ref 98–111)
Creatinine, Ser: 0.7 mg/dL (ref 0.44–1.00)
GFR calc Af Amer: >60 mL/min (ref >60)
GFR calc non Af Amer: >60 mL/min (ref >60)
Glucose, Bld: 109 mg/dL — ABNORMAL HIGH (ref 70–99)
Potassium: 4 mmol/L (ref 3.5–5.1)
Sodium: 132 mmol/L — ABNORMAL LOW (ref 135–145)

## 2019-08-09 LAB — PHOSPHORUS: Phosphorus: 3.8 mg/dL (ref 2.5–4.6)

## 2019-08-09 LAB — MAGNESIUM: Magnesium: 2 mg/dL (ref 1.7–2.4)

## 2019-08-09 MED ORDER — FUROSEMIDE 20 MG PO TABS: 40.0000 mg | ORAL_TABLET | Freq: Every day | ORAL | 0 refills | Status: AC

## 2019-08-09 NOTE — Telephone Encounter (Signed)
I saw the patient in consultation yesterday while she was still in the hospital.  I have also spoken to her oncologist and to Dr. Posey Pronto who is the hospitalist discharging her home today.  She will not need bronchoscopy at this point.  She likely would not tolerate general anesthesia without prolonged ventilator support afterwards.  I have discussed with Dr. Rogue Bussing her oncologist who will see her tomorrow in follow-up at the office.  The plan will be to start chemotherapy and see how she does.  The most recent CT scan shows that things are consistent with recurrent cancer.  These are aware of the plan.  Patient's daughter was not in the room yesterday when I came by.  LG

## 2019-08-09 NOTE — Telephone Encounter (Signed)
I spoke with Cathy Watts and notified of response per Dr Patsey Berthold. She verbalized understanding. She will make sure pt keeps appt with oncology.

## 2019-08-09 NOTE — Progress Notes (Signed)
Morning medications administered per orders.  Pt. Sitting up in bed, denies any pain or needs at this time. Attempted to call daughter, no answer, message left.

## 2019-08-09 NOTE — Discharge Summary (Signed)
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 Abdominal aortic aneurysm (AAA) 3.0 cm to 5.0 cm in diameter in female [facility]) 05/2018   being followed by dr. Delana Meyer. will reultrasound in 1 year  . Anxiety   . Asthma   . Barrett's esophagus   . COPD (chronic obstructive pulmonary disease) ([place])   . Depression   . GERD (gastroesophageal reflux disease)   . History of peptic ulcer disease   . Hypercholesterolemia   . Low oxygen saturation    2l hs  . Malignant neoplasm of upper lobe of left lung ([place]) 04/2019  . Osteopenia   . Panic disorder   . Personal history of radiation therapy   . Personal history of tobacco use, presenting hazards to health 01/16/2015  . Requires supplemental oxygen 05/2018   supposed to use 2L NP at night but she currently does not.  encouraged patient to resume this  . Sleep apnea    snores significantly but has never been tested for OSA  . SOB (shortness of breath)   . 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  #  malignant neoplasm left lung with history of large cell neuroendocrine tumor /lung cancer -Patient presents forevaluationof worsening shortness of breath Shehad a CT scan of the chest done which showedinterval progression of diseasewith increase in size of left perihilar mass with progressive invasion of the left hilum and mediastinum.New left supraclavicular and right paratracheal adenopathy. New small left pleural effusion. Innumerable small nodules throughout the left lung arenonspecific in may be related to postobstructive changes. Metastaticdisease not excluded. Emphysema and aortic atherosclerosis. Three vessel coronary artery calcifications. -Symptoms may likely be secondary to worsening lung cancer,unlikely to be secondary to congestive heart failure --Oncology consult: (Patient wasseen by Dr Patsey Berthold who was planning bronchoscopy (possible EBUS) and biopsy to document recurrence vs radiation induced scarring. In addition, discussion was held about possible cryotherapy, balloon dilatation and endobronchial stent placement in the left mainstem bronchus. Patient's respiratory status not favorable for procedure at present perpulmonary Dr. Duwayne Heck. -Patient will need chemotherapy -- Dr. Rogue Bussing has set up patient to start chemotherapy starting 6th April.  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 -4/4 completed 21 days off isolation after being Covid positive.  off isolation 08/09/2019  # Congestive heart failure chronic -Patient has a 10lb weight gain in about 2 weeks--  hypoalbuminemia and third spacing -Lower extremity edema, venous duplex  negative for DVT -08/06/2019 TTE shows LVEF 50 to 55% LV wall motion and systolic and diastolic function -4/4 incraesed Lasix 40 mg IV BID-- oral Lasix 40 daily -patient advised keep legs elevated -Cardiology following  # Atrial fibrillation-neck Continue Eliquis as primary prophylaxis for an acute stroke Rate control withdiltiazem, and metoprolol 50 twice daily  # Depression Continue Lexapro and BuSpar, Klonopin 0.25 twice daily  #Restless leg syndrome,-on Requip   #GERD,-Carafate and PPI therapy  #Dyslipidemia,-on pravachol   Body mass index is 46.49 kg/m.  Morbid obesity Interventions: Calorie restricted diet and daily exercise advised to lose body weight    Diet: Cardiac diet DVT Prophylaxis: Eliquis  Advance goals of care discussion: Full code  Family Communication: No family was present at bedside, at the time of interview.  Patient told me daughter is aware. Dr. Rogue Bussing spoke at length with daughter regarding treatment plan.  Disposition:  Pt is from home. Will discharge to home with home health which will be resumed. Should follow-up with oncology tomorrow to begin her chemotherapy.   CONSULTS OBTAINED:  Treatment Team:  Teodoro Spray, MD Tyler Pita, MD  DRUG ALLERGIES:   Allergies  Allergen Reactions  . Amoxicillin Hives and Itching    Did it involve swelling of the face/tongue/throat, SOB, or low BP? No Did it involve sudden or severe rash/hives, skin peeling, or any reaction on the inside of your mouth or nose? No Did you need to seek medical attention at a hospital or doctor's office? No When did it last happen?2018 If all above answers are "NO", may proceed with cephalosporin use.     . Chlorhexidine Dermatitis    Patient refused    DISCHARGE MEDICATIONS:   Allergies as of 08/09/2019      Reactions   Amoxicillin Hives, Itching   Did it involve swelling of the face/tongue/throat, SOB, or low BP? No Did it  involve sudden or severe rash/hives, skin peeling, or any reaction on the inside of your mouth or nose? No Did you need to seek medical attention at a hospital or doctor's office? No When did it last happen?2018 If all above answers are "NO", may proceed with cephalosporin use.   Chlorhexidine Dermatitis   Patient refused      Medication List    STOP taking these medications   ascorbic acid 500 MG tablet Commonly known as: VITAMIN C     TAKE these medications   acetaminophen 325 MG tablet Commonly known as: TYLENOL Take 2 tablets (650 mg total) by mouth every 6 (six) hours as needed for mild pain or headache (fever >/= 101).   apixaban 5 MG Tabs tablet Commonly known as: ELIQUIS Take 1 tablet (5 mg total) by mouth 2 (two) times daily.   busPIRone 30 MG tablet Commonly known as: BUSPAR Take 1 tablet by mouth twice daily   clonazePAM 0.5 MG tablet Commonly known as: KLONOPIN Take 0.5-1 tablets (0.25-0.5 mg total) by mouth 2 (two) times daily as needed for anxiety. What changed: when to take this   diltiazem 360 MG 24 hr capsule Commonly known as: CARDIZEM CD Take 1 capsule (360 mg total) by mouth daily.   escitalopram 20 MG tablet Commonly known as: LEXAPRO Take 1 tablet by mouth once daily   Flutter Devi 1 each by Does not apply route QID.   furosemide 20 MG tablet Commonly known as: LASIX Take 2 tablets (40 mg total) by  mouth daily.   ipratropium 17 MCG/ACT inhaler Commonly known as: ATROVENT HFA Inhale 2 puffs into the lungs 4 (four) times daily.   ipratropium-albuterol 0.5-2.5 (3) MG/3ML Soln Commonly known as: DUONEB Take 3 mLs by nebulization every 6 (six) hours as needed. What changed: reasons to take this   Magnesium 250 MG Tabs Take 250 mg by mouth 2 (two) times daily.   metoprolol tartrate 50 MG tablet Commonly known as: LOPRESSOR Take 50 mg by mouth 2 (two) times daily.   nystatin 100000 UNIT/ML suspension Commonly known as:  MYCOSTATIN TAKE 5 ML BY MOUTH 4 TIMES DAILY   omeprazole 40 MG capsule Commonly known as: PRILOSEC Take 1 capsule by mouth once daily   polyethylene glycol powder 17 GM/SCOOP powder Commonly known as: GLYCOLAX/MIRALAX TAKE 17 GRAMS OF POWDER MIXED IN 8 OUNCES OF WATER BY MOUTH ONCE A DAY.   potassium chloride SA 20 MEQ tablet Commonly known as: KLOR-CON Take 1 tablet (20 mEq total) by mouth daily.   pravastatin 20 MG tablet Commonly known as: PRAVACHOL TAKE 1 TABLET BY MOUTH AT BEDTIME What changed: when to take this   ProAir HFA 108 (90 Base) MCG/ACT inhaler Generic drug: albuterol INHALE 2 PUFFS BY MOUTH EVERY 4 HOURS AS NEEDED FOR WHEEZING FOR SHORTNESS OF BREATH What changed: See the new instructions.   rOPINIRole 3 MG tablet Commonly known as: REQUIP TAKE 1 TABLET BY MOUTH AT BEDTIME   sucralfate 1 g tablet Commonly known as: CARAFATE Take 1 g by mouth 3 (three) times daily. DISSOLVE IN THREE TO FOUR TABLESPOONFULS OF WARM WARM SWISH AND SWALLOW   Trelegy Ellipta 100-62.5-25 MCG/INH Aepb Generic drug: Fluticasone-Umeclidin-Vilant Inhale 1 puff into the lungs daily.       If you experience worsening of your admission symptoms, develop shortness of breath, life threatening emergency, suicidal or homicidal thoughts you must seek medical attention immediately by calling 911 or calling your MD immediately  if symptoms less severe.  You Must read complete instructions/literature along with all the possible adverse reactions/side effects for all the Medicines you take and that have been prescribed to you. Take any new Medicines after you have completely understood and accept all the possible adverse reactions/side effects.   Please note  You were cared for by a hospitalist during your hospital stay. If you have any questions about your discharge medications or the care you received while you were in the hospital after you are discharged, you can call the unit and asked to  speak with the hospitalist on call if the hospitalist that took care of you is not available. Once you are discharged, your primary care physician will handle any further medical issues. Please note that NO REFILLS for any discharge medications will be authorized once you are discharged, as it is imperative that you return to your primary care physician (or establish a relationship with a primary care physician if you do not have one) for your aftercare needs so that they can reassess your need for medications and monitor your lab values. Today   SUBJECTIVE   Shortness of breath and chronic cough VITAL SIGNS:  Blood pressure 121/69, pulse 100, temperature 98.4 F (36.9 C), temperature source Oral, resp. rate 16, height 5\' 10"  (1.778 m), weight (!) 147 kg, SpO2 93 %.  I/O:    Intake/Output Summary (Last 24 hours) at 08/09/2019 1450 Last data filed at 08/09/2019 0614 Gross per 24 hour  Intake 1000 ml  Output 1675 ml  Net -675 ml  PHYSICAL EXAMINATION:  GENERAL:  74 y.o.-year-old patient lying in the bed with no acute distress. Chronically ill EYES: Pupils equal, round, reactive to light and accommodation. No scleral icterus.  HEENT: Head atraumatic, normocephalic. Oropharynx and nasopharynx clear.  NECK:  Supple, no jugular venous distention. No thyroid enlargement, no tenderness.  LUNGS Coarse breath sounds bilaterally, no wheezing, rales,rhonchi or crepitation. No use of accessory muscles of respiration.  CARDIOVASCULAR: S1, S2 normal. No murmurs, rubs, or gallops.  ABDOMEN: Soft, non-tender, non-distended. Bowel sounds present. No organomegaly or mass.  EXTREMITIES: ++ pedal edema, no cyanosis, or clubbing.  NEUROLOGIC: Cranial nerves II through XII are intact. Muscle strength 5/5 in all extremities. Sensation intact. Gait not checked.  PSYCHIATRIC: The patient is alert and oriented x 3.  SKIN: No obvious rash, lesion, or ulcer.   DATA REVIEW:   CBC  Recent Labs  Lab  08/09/19 0622  WBC 11.4*  HGB 11.5*  HCT 36.2  PLT 319    Chemistries  Recent Labs  Lab 08/06/19 1139 08/07/19 0500 08/09/19 0622  NA 133*   < > 132*  K 3.7   < > 4.0  CL 93*   < > 93*  CO2 30   < > 28  GLUCOSE 114*   < > 109*  BUN 15   < > 18  CREATININE 0.63   < > 0.70  CALCIUM 8.5*   < > 8.8*  MG 1.7   < > 2.0  AST 24  --   --   ALT 47*  --   --   ALKPHOS 67  --   --   BILITOT 0.7  --   --    < > = values in this interval not displayed.    Microbiology Results   Recent Results (from the past 240 hour(s))  Blood culture (routine x 2)     Status: None (Preliminary result)   Collection Time: 08/06/19 12:36 PM   Specimen: BLOOD  Result Value Ref Range Status   Specimen Description BLOOD LAC  Final   Special Requests   Final    BOTTLES DRAWN AEROBIC AND ANAEROBIC Blood Culture results may not be optimal due to an inadequate volume of blood received in culture bottles   Culture   Final    NO GROWTH 3 DAYS Performed at [facility], [address] [place], [place] [address]    Report Status PENDING  Incomplete  Blood culture (routine x 2)     Status: None (Preliminary result)   Collection Time: 08/06/19 12:36 PM   Specimen: BLOOD  Result Value Ref Range Status   Specimen Description BLOOD R H  Final   Special Requests   Final    BOTTLES DRAWN AEROBIC AND ANAEROBIC Blood Culture results may not be optimal due to an inadequate volume of blood received in culture bottles   Culture   Final    NO GROWTH 3 DAYS Performed at [facility], [address] [place], [place] [address]    Report Status PENDING  Incomplete    RADIOLOGY:  No results found.   CODE STATUS:     Code Status Orders  (From admission, onward)         Start     Ordered   08/06/19 1451  Full code  Continuous     08/06/19 1453        Code Status History    Date Active Date Inactive Code Status Order ID Comments User  Context   07/19/2019 0522 07/23/2019 2129 Full  Code [id number]  Christel Mormon, MD ED   07/19/2019 0442 07/19/2019 0522 DNR [id number]  Christel Mormon, MD ED   07/10/2019 0142 07/15/2019 2125 DNR [id number]  Gwynne Edinger, MD Inpatient   06/17/2019 2240 06/19/2019 2216 Full Code [id number]  Mansy, Arvella Merles, MD ED   Advance Care Planning Activity    Advance Directive Documentation     Most Recent Value  Type of Advance Directive  Living will, Healthcare Power of Attorney  Pre-existing out of facility DNR order (yellow form or pink MOST form)  -  "MOST" Form in Place?  -       TOTAL TIME TAKING CARE OF THIS PATIENT: *40* minutes.    Fritzi Mandes M.D  [facility]    CC: Primary care physician; Mar Daring, PA-C

## 2019-08-09 NOTE — TOC Transition Note (Signed)
Transition of Care [facility]) - CM/SW Discharge Note   Patient Details  Name: Cathy Watts MRN: [id number] Date of Birth: [birth year]  Transition of Care [facility]) CM/SW Contact:  Elease Hashimoto, LCSW Phone Number: 08/09/2019, 1:28 PM   Clinical Narrative:   Pt known to this worker from previous admit. She lives with her daughter and daughter's fiance and her other two daughter's assist with her care. They have been providing 24 hr care and will continue to do this. They are very committed to their mother. She is active with [facility] and has all needed equipment. Her daughter transports her to her appointments. She has palliative care follow also in the community. She is aware of her discharge today and is agreeable to this along with her daughter's. No other needs will sign off.    Final next level of care: [place] Barriers to Discharge: Barriers Resolved   Patient Goals and CMS Choice Patient states their goals for this hospitalization and ongoing recovery are:: Go home with daughter and daughter's fiance      Discharge Placement                       Discharge Plan and Services In-house Referral: Clinical Social Work   Post Acute Care Choice: Home Health                    HH Arranged: RN, PT [facility] Agency: [place] ([facility]) Date [place]: 08/09/19 Time [place]: [place] Representative spoke with at [place]: [place] ([place]) Interventions     Readmission Risk Interventions Readmission Risk Prevention Plan 07/21/2019  Transportation Screening Complete  Medication Review Press photographer) Complete  PCP or Specialist appointment within 3-5 days of discharge Complete  HRI or [place] Complete  Some recent data might be hidden

## 2019-08-09 NOTE — Progress Notes (Signed)
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     A cycle is every 21 days:     Etoposide      Carboplatin   **Always confirm dose/schedule in your pharmacy ordering system**  REASON: Disease Progression PRIOR TREATMENT: LOS320: Carboplatin AUC=5 D1 + Etoposide 100 mg/m2 IV D1-3 q21 Days x 4 Cycles with Concurrent Radiation TREATMENT RESPONSE: Progressive Disease (PD)  START ON PATHWAY REGIMEN - Small Cell Lung     Cycles 1 through 4, every 21 days:     Atezolizumab      Carboplatin      Etoposide    Cycles 5 and beyond, every 21 days:     Atezolizumab   **Always confirm dose/schedule in your pharmacy ordering system**  Patient Characteristics: Relapsed or Progressive Disease, Second Line, Relapse > 6 Months Therapeutic Status: Relapsed or Progressive Disease Line of Therapy: Second Line Time to Relapse: Relapse > 6 Months Intent of Therapy: Non-Curative / Palliative Intent, Discussed with Patient

## 2019-08-09 NOTE — Discharge Instructions (Signed)
Use your oxygen, inhalers and nebulizer as before.  F/u with Dr Rogue Bussing for your chemotherapy starting tomorrow.

## 2019-08-09 NOTE — Care Management Important Message (Signed)
Important Message  Patient Details  Name: Cathy Watts MRN: [id number] Date of Birth: 03-16-1946   Medicare Important Message Given:  Yes     Elease Hashimoto, LCSW 08/09/2019, 10:52 AM

## 2019-08-09 NOTE — Progress Notes (Signed)
Cathy Watts   DOB:[birth year]   EL#:[id number]    Subjective: Patient continues to have shortness of breath on exertion.  However improved since admission.  No hemoptysis.  No nausea no vomiting headaches.  Eager to go home.  No acute events overnight.  Objective:  Vitals:   08/09/19 0112 08/09/19 0829  BP:  121/69  Pulse: 98 100  Resp: 18 16  Temp: 98.1 F (36.7 C) 98.4 F (36.9 C)  SpO2:  93%     Intake/Output Summary (Last 24 hours) at 08/09/2019 1254 Last data filed at 08/09/2019 2585 Gross per 24 hour  Intake 1240 ml  Output 1675 ml  Net -435 ml    Physical Exam  Constitutional: She is oriented to person, place, and time and well-developed, well-nourished, and in no distress.  Nasal cannula oxygen.  Alone.  HENT:  Head: Normocephalic and atraumatic.  Mouth/Throat: Oropharynx is clear and moist. No oropharyngeal exudate.  Eyes: Pupils are equal, round, and reactive to light.  Cardiovascular: Normal rate and regular rhythm.  Pulmonary/Chest: No respiratory distress. She has no wheezes.  Decreased air entry bilaterally.  Left more than right  Abdominal: Soft. Bowel sounds are normal. She exhibits no distension and no mass. There is no abdominal tenderness. There is no rebound and no guarding.  Musculoskeletal:        General: Edema present. No tenderness. Normal range of motion.     Cervical back: Normal range of motion and neck supple.  Neurological: She is alert and oriented to person, place, and time.  Skin: Skin is warm.  Psychiatric: Affect normal.     Labs:  Lab Results  Component Value Date   WBC 11.4 (H) 08/09/2019   HGB 11.5 (L) 08/09/2019   HCT 36.2 08/09/2019   MCV 87.0 08/09/2019   PLT 319 08/09/2019   NEUTROABS 9.5 (H) 07/23/2019    Lab Results  Component Value Date   NA 132 (L) 08/09/2019   K 4.0 08/09/2019   CL 93 (L) 08/09/2019   CO2 28 08/09/2019    Studies:  No results found.  Malignant neoplasm of hilus of left lung  [facility]) #74 year old female patient with history of COPD on home O2; history of large cell neuroendocrine malignancy-currently in the hospital for worsening shortness of breath/swelling in the legs.  #Worsening shortness of breath-likely secondary to progressive disease in the lungs/with underlying baseline severe COPD.  See discussion below  #Bilateral leg swelling-likely secondary to hypoalbuminemia/third spacing-check urine protein.  Clinically no florid CHF.  Reviewed cardiology recommendations.  #Recommendations:   Treatment plan/prognosis: Patient unfortunately has poor prognosis given the imaging findings concerning for recurrence.  I do not think patient is a candidate for biopsy given her tenuous respiratory status.  As per pulmonary-high risk for general anesthesia/bronchoscopic evaluation.  Discussed that would recommend chemotherapy to help shrink the cancer on palliative basis.  Discussed with the patient that the cancer is incurable and median life expectancy of recurrent/stage IV malignancy is about 8 to 12 months.  However given her severe comorbidities/borderline performance status-she would be high risk of complications.  However, after a long discussion with the patient's daughter Cathy Watts felt her mother always had hoped for quality of life over quantity of life.  I think it is reasonable to give patient and family some more time to assess the situation-and then decide on chemotherapy.  I plan to see them tomorrow in the clinic as planned.  Discussed with Dr. Myer Haff patient could be discharged if clinically  stable.    Cammie Sickle, MD 08/09/2019  12:54 PM

## 2019-08-09 NOTE — Progress Notes (Signed)
Carbo-etop-atezo- orders are in.

## 2019-08-09 NOTE — Progress Notes (Signed)
Spoke with [name], daughter, on phone and provided update. Expressed understanding and denies any further concerns.

## 2019-08-09 NOTE — Assessment & Plan Note (Signed)
#  74 year old female patient with history of COPD on home O2; history of large cell neuroendocrine malignancy-currently in the hospital for worsening shortness of breath/swelling in the legs.  #Worsening shortness of breath-likely secondary to progressive disease in the lungs/with underlying baseline severe COPD.  See discussion below  #Bilateral leg swelling-likely secondary to hypoalbuminemia/third spacing-check urine protein.  Clinically no florid CHF.  Reviewed cardiology recommendations.  #Recommendations:   Treatment plan/prognosis: Patient unfortunately has poor prognosis given the imaging findings concerning for recurrence.  I do not think patient is a candidate for biopsy given her tenuous respiratory status.  As per pulmonary-high risk for general anesthesia/bronchoscopic evaluation.  Discussed that would recommend chemotherapy to help shrink the cancer on palliative basis.  Discussed with the patient that the cancer is incurable and median life expectancy of recurrent/stage IV malignancy is about 8 to 12 months.  However given her severe comorbidities/borderline performance status-she would be high risk of complications.  However, after a long discussion with the patient's daughter [name] felt her mother always had hoped for quality of life over quantity of life.  I think it is reasonable to give patient and family some more time to assess the situation-and then decide on chemotherapy.  I plan to see them tomorrow in the clinic as planned.  Discussed with Dr. Myer Haff patient could be discharged if clinically stable.

## 2019-08-10 ENCOUNTER — Encounter: Payer: Self-pay | Admitting: Internal Medicine

## 2019-08-10 ENCOUNTER — Inpatient Hospital Stay: Payer: Medicare Other | Admitting: Hospice and Palliative Medicine

## 2019-08-10 ENCOUNTER — Telehealth: Payer: Self-pay | Admitting: Nurse Practitioner

## 2019-08-10 ENCOUNTER — Telehealth: Payer: Self-pay

## 2019-08-10 ENCOUNTER — Telehealth: Payer: Self-pay | Admitting: *Deleted

## 2019-08-10 ENCOUNTER — Other Ambulatory Visit: Payer: Self-pay

## 2019-08-10 ENCOUNTER — Ambulatory Visit: Admission: RE | Admit: 2019-08-10 | Payer: Medicare Other | Source: Ambulatory Visit | Admitting: Radiation Oncology

## 2019-08-10 ENCOUNTER — Inpatient Hospital Stay (HOSPITAL_BASED_OUTPATIENT_CLINIC_OR_DEPARTMENT_OTHER): Payer: Medicare Other | Admitting: Internal Medicine

## 2019-08-10 ENCOUNTER — Inpatient Hospital Stay: Payer: Medicare Other | Attending: Internal Medicine

## 2019-08-10 DIAGNOSIS — C3402 Malignant neoplasm of left main bronchus: Secondary | ICD-10-CM

## 2019-08-10 DIAGNOSIS — Z8041 Family history of malignant neoplasm of ovary: Secondary | ICD-10-CM | POA: Diagnosis not present

## 2019-08-10 DIAGNOSIS — Z803 Family history of malignant neoplasm of breast: Secondary | ICD-10-CM | POA: Insufficient documentation

## 2019-08-10 DIAGNOSIS — G4700 Insomnia, unspecified: Secondary | ICD-10-CM | POA: Diagnosis not present

## 2019-08-10 DIAGNOSIS — F3341 Major depressive disorder, recurrent, in partial remission: Secondary | ICD-10-CM | POA: Diagnosis not present

## 2019-08-10 DIAGNOSIS — R630 Anorexia: Secondary | ICD-10-CM | POA: Diagnosis not present

## 2019-08-10 DIAGNOSIS — T451X5A Adverse effect of antineoplastic and immunosuppressive drugs, initial encounter: Secondary | ICD-10-CM | POA: Diagnosis not present

## 2019-08-10 DIAGNOSIS — Z800 Family history of malignant neoplasm of digestive organs: Secondary | ICD-10-CM | POA: Diagnosis not present

## 2019-08-10 DIAGNOSIS — Z95828 Presence of other vascular implants and grafts: Secondary | ICD-10-CM

## 2019-08-10 DIAGNOSIS — J449 Chronic obstructive pulmonary disease, unspecified: Secondary | ICD-10-CM | POA: Insufficient documentation

## 2019-08-10 DIAGNOSIS — D701 Agranulocytosis secondary to cancer chemotherapy: Secondary | ICD-10-CM | POA: Insufficient documentation

## 2019-08-10 DIAGNOSIS — M7989 Other specified soft tissue disorders: Secondary | ICD-10-CM | POA: Diagnosis not present

## 2019-08-10 DIAGNOSIS — I700 Atherosclerosis of aorta: Secondary | ICD-10-CM

## 2019-08-10 DIAGNOSIS — Z87891 Personal history of nicotine dependence: Secondary | ICD-10-CM | POA: Insufficient documentation

## 2019-08-10 DIAGNOSIS — Z809 Family history of malignant neoplasm, unspecified: Secondary | ICD-10-CM | POA: Insufficient documentation

## 2019-08-10 DIAGNOSIS — Z9981 Dependence on supplemental oxygen: Secondary | ICD-10-CM | POA: Diagnosis not present

## 2019-08-10 DIAGNOSIS — Z8616 Personal history of COVID-19: Secondary | ICD-10-CM | POA: Insufficient documentation

## 2019-08-10 MED ORDER — SODIUM CHLORIDE 0.9% FLUSH
10.0000 mL | Freq: Once | INTRAVENOUS | Status: AC
  Administered 2019-08-10: 10 mL via INTRAVENOUS
  Filled 2019-08-10: qty 10

## 2019-08-10 MED ORDER — HEPARIN SOD (PORK) LOCK FLUSH 100 UNIT/ML IV SOLN
500.0000 [IU] | Freq: Once | INTRAVENOUS | Status: AC
  Administered 2019-08-10: 10:00:00 500 [IU] via INTRAVENOUS
  Filled 2019-08-10: qty 5

## 2019-08-10 MED ORDER — TRAZODONE HCL 50 MG PO TABS: 50.0000 mg | ORAL_TABLET | Freq: Every day | ORAL | 1 refills | Status: AC

## 2019-08-10 NOTE — Assessment & Plan Note (Addendum)
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  #I had a long discussion the patient and daughters regarding the aggressive nature of relapse.  Even with chemotherapy patient would be incurable; with median life expectancy anywhere between 8 to 12 months even with chemotherapy.  #Given patient's performance status ECOG 3-4; cardiac issues severe COPD/O2 dependent-patient with very very high risk of side effects/adverse events from systemic therapy.  Patient high risk for admission to hospital and death from complications.  #Other injury discussion patient and family interested in-quality of life rather than quantity of life.  Interested in the supportive care.  Discussed the hospice philosophy.  They are interested in home hospice.  Hospice referral made  #Insomnia-recommend trazodone.  Prescription sent.  #Blood pressure systolic 88K; recommend decrease Lasix to 20 mg a day; and discontinue Lopressor.  #I spoke to Debera Lat palliative care NP; kindly agrees to evaluate the patient sooner this week.   ## DISPOSITION: # referral to hospice # follow up as needed-Dr.B  Cc; Otila Kluver.

## 2019-08-10 NOTE — Progress Notes (Signed)
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 NOV 15th CT NED  # DEC-JAN 2020-CT/PET-Left hilar/mediastinal shift large cell neuroendocrine; Limited stage.TxN2-Stage III  # 18th Feb 2020- carbo-Etop with RT [finished RT April 16th]; #4 cycle carbo-etop on 09/15/2018.   # COPD on 02 at night; hx of smoking.  --------------------------------------------------    DIAGNOSIS: #  RLL Lung ca-/Stage I; # Left Hilar cancer/stage III  GOALS: cure  CURRENT/MOST RECENT THERAPY: surveillaince    Malignant neoplasm of hilus of left lung ([place])  08/09/2019 -  Chemotherapy   The patient had PALONOSETRON HCL INJECTION 0.25 MG/5ML, 0.25 mg, Intravenous,  Once, 0 of 4 cycles pegfilgrastim-cbqv (UDENYCA) injection 6 mg, 6 mg, Subcutaneous, Once, 0 of 4 cycles CARBOplatin (PARAPLATIN) in sodium chloride 0.9 % 100 mL chemo infusion, , Intravenous,  Once, 0 of 4 cycles etoposide (VEPESID) 270 mg in sodium chloride 0.9 % 1,000 mL chemo infusion, 100 mg/m2, Intravenous,  Once, 0 of 4 cycles FOSAPREPITANT IV INFUSION 150 MG, 150 mg, Intravenous,  Once, 0 of 4 cycles atezolizumab (TECENTRIQ) 1,200 mg in sodium chloride 0.9 % 250 mL chemo infusion, 1,200 mg, Intravenous, Once, 0 of 8 cycles  for chemotherapy treatment.    Cancer of lower lobe of right lung [facility])        [place] OFFICE PROGRESS NOTE  Patient Care Team: Mar Daring, PA-C as PCP - General (Family Medicine) Telford Nab, RN as Registered Nurse Rogue Bussing, Elisha Headland, MD as Consulting Physician (Internal Medicine) Flora Lipps, MD as Consulting Physician (Pulmonary Disease) Noreene Filbert, MD as Referring Physician (Radiation Oncology) Cammie Sickle, MD as Consulting Physician (Hematology and Oncology)  Cancer Staging Cancer of lower lobe of right lung [facility]) Staging form: Lung, AJCC 7th Edition - Clinical: No stage assigned - Unsigned    Oncology History Overview Note  # SEP 2016-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 NOV 15th CT NED  # DEC-JAN 2020-CT/PET-Left hilar/mediastinal shift large cell neuroendocrine; Limited stage.TxN2-Stage III  # 18th Feb 2020- carbo-Etop with RT [finished RT April 16th]; #4 cycle carbo-etop on 09/15/2018.   # COPD on 02 at night; hx of smoking.  --------------------------------------------------    DIAGNOSIS: #  RLL Lung ca-/Stage I; # Left Hilar cancer/stage III  GOALS: cure  CURRENT/MOST RECENT THERAPY: surveillaince    Malignant neoplasm of hilus of left lung ([place])  08/09/2019 -  Chemotherapy   The patient had PALONOSETRON HCL INJECTION 0.25 MG/5ML, 0.25 mg, Intravenous,  Once, 0 of 4 cycles pegfilgrastim-cbqv (UDENYCA) injection 6 mg, 6 mg, Subcutaneous, Once, 0 of 4 cycles CARBOplatin (PARAPLATIN) in sodium chloride 0.9 % 100 mL chemo infusion, , Intravenous,  Once, 0 of 4 cycles etoposide (VEPESID) 270 mg in sodium chloride 0.9 % 1,000 mL chemo infusion, 100 mg/m2, Intravenous,  Once, 0 of 4  cycles FOSAPREPITANT IV INFUSION 150 MG, 150 mg, Intravenous,  Once, 0 of 4 cycles atezolizumab (TECENTRIQ) 1,200 mg in sodium chloride 0.9 % 250 mL chemo infusion, 1,200 mg, Intravenous,  Once, 0 of 8 cycles  for chemotherapy treatment.    Cancer of lower lobe of right lung [facility])     INTERVAL HISTORY:  Cathy Watts 74 y.o.  female pleasant patient above history of left hilar large cell neuroendocrine-/limited stage-currently on concurrent chemoradiation is here for follow-up.   Unfortunately patient was admitted to hospital twice in the interim.  Surgeries discharged in the hospital yesterday-after she was admitted to the hospital for worsening bilateral lower extremity swelling; worsening shortness of breath.  Patient had a CT scan that showed progressive left lower lobe lesion with atelectasis and worsening mediastinal adenopathy.  Of note patient recently recovered from Covid infection.  Patient continues to feel poorly.  Complains of worsening swelling in the legs.  Poor appetite.  Difficulty sleeping at night.  Anxious.  As per the daughter patient having hallucinations.  Review of Systems  Constitutional: Positive for malaise/fatigue. Negative for chills, diaphoresis, fever and weight loss.  HENT: Negative for nosebleeds and sore throat.   Eyes: Negative for double vision.  Respiratory: Positive for cough and shortness of breath. Negative for hemoptysis, sputum production and wheezing.   Cardiovascular: Positive for leg swelling. Negative for chest pain, palpitations and orthopnea.  Gastrointestinal: Negative for abdominal pain, blood in stool, constipation, diarrhea, heartburn, melena, nausea and vomiting.  Genitourinary: Negative for dysuria, frequency and urgency.  Musculoskeletal: Negative for back pain and joint pain.  Skin: Negative.  Negative for itching and rash.  Neurological: Negative for dizziness, tingling, focal weakness, weakness and headaches.  Endo/Heme/Allergies: Does not bruise/bleed easily.  Psychiatric/Behavioral: Positive for hallucinations. Negative for depression. The patient is nervous/anxious and has insomnia.       PAST MEDICAL  HISTORY :  Past Medical History:  Diagnosis Date  . Abdominal aortic aneurysm (AAA) 3.0 cm to 5.0 cm in diameter in female [facility]) 05/2018   being followed by dr. Delana Meyer. will reultrasound in 1 year  . Anxiety   . Asthma   . Barrett's esophagus   . COPD (chronic obstructive pulmonary disease) ([place])   . Depression   . GERD (gastroesophageal reflux disease)   . History of peptic ulcer disease   . Hypercholesterolemia   . Low oxygen saturation    2l hs  . Malignant neoplasm of upper lobe of left lung ([place]) 04/2019  . Osteopenia   . Panic disorder   . Personal history of radiation therapy   . Personal history of tobacco use, presenting hazards to health 01/16/2015  . Requires supplemental oxygen 05/2018   supposed to use 2L NP at night but she currently does not.  encouraged patient to resume this  . Sleep apnea    snores significantly but has never been tested for OSA  . SOB (shortness of breath)   . Squamous cell carcinoma of right lung ([place]) 2016   Chemo + Rad tx's    PAST SURGICAL HISTORY :   Past Surgical History:  Procedure Laterality Date  . CERVICAL CONE BIOPSY  1990  . CHOLECYSTECTOMY  1980's  . colonoscopy  2014  . COLONOSCOPY  07/21/2012  . COLONOSCOPY WITH PROPOFOL N/A 05/12/2018   Procedure: COLONOSCOPY WITH PROPOFOL;  Surgeon: Lollie Sails, MD;  Location: [facility] ENDOSCOPY;  Service: Endoscopy;  Laterality: N/A;  . ELBOW FRACTURE SURGERY Right 2009  . ENDOBRONCHIAL  ULTRASOUND N/A 02/07/2015   Procedure: ENDOBRONCHIAL ULTRASOUND;  Surgeon: Flora Lipps, MD;  Location: [facility] ORS;  Service: Cardiopulmonary;  Laterality: N/A;  . ENDOBRONCHIAL ULTRASOUND Left 06/05/2018   Procedure: ENDOBRONCHIAL ULTRASOUND;  Surgeon: Flora Lipps, MD;  Location: [facility] ORS;  Service: Cardiopulmonary;  Laterality: Left;  . ESOPHAGOGASTRODUODENOSCOPY (EGD) WITH PROPOFOL N/A 02/26/2016   Procedure: ESOPHAGOGASTRODUODENOSCOPY (EGD) WITH PROPOFOL;  Surgeon: Lollie Sails, MD;  Location: [facility]  ENDOSCOPY;  Service: Endoscopy;  Laterality: N/A;  COPD, Sleep Apnea  . ESOPHAGOGASTRODUODENOSCOPY (EGD) WITH PROPOFOL N/A 05/12/2018   Procedure: ESOPHAGOGASTRODUODENOSCOPY (EGD) WITH PROPOFOL;  Surgeon: Lollie Sails, MD;  Location: [facility] ENDOSCOPY;  Service: Endoscopy;  Laterality: N/A;  . EYE SURGERY Bilateral    cataract extractions  . FRACTURE SURGERY  2005   elbow repair  . PORTA CATH INSERTION N/A 06/17/2018   Procedure: PORTA CATH INSERTION;  Surgeon: Katha Cabal, MD;  Location: [place] CV LAB;  Service: Cardiovascular;  Laterality: N/A;  . TONSILLECTOMY  1979  . TUBAL LIGATION  1970's  . UPPER GI ENDOSCOPY      FAMILY HISTORY :   Family History  Problem Relation Age of Onset  . Colon cancer Mother        colon  . Diabetes Mother   . Arthritis Mother   . Glaucoma Mother   . Stomach cancer Father        stomach  . Throat cancer Brother        throat  . Breast cancer Sister        breast  . Diabetes Sister   . Cirrhosis Sister   . Cancer Paternal Aunt   . Parkinson's disease Sister   . Heart attack Sister   . Ovarian cancer Sister     SOCIAL HISTORY:   Social History   Tobacco Use  . Smoking status: Former Smoker    Packs/day: 1.00    Years: 55.00    Pack years: 55.00    Types: Cigarettes    Quit date: 11/02/2017    Years since quitting: 1.7  . Smokeless tobacco: Never Used  Substance Use Topics  . Alcohol use: Yes    Alcohol/week: 0.0 standard drinks    Comment: occasionally- wine  . Drug use: No    ALLERGIES:  is allergic to amoxicillin and chlorhexidine.  MEDICATIONS:  Current Outpatient Medications  Medication Sig Dispense Refill  . acetaminophen (TYLENOL) 325 MG tablet Take 2 tablets (650 mg total) by mouth every 6 (six) hours as needed for mild pain or headache (fever >/= 101).    Marland Kitchen apixaban (ELIQUIS) 5 MG TABS tablet Take 1 tablet (5 mg total) by mouth 2 (two) times daily. 60 tablet 1  . busPIRone (BUSPAR) 30 MG tablet Take 1  tablet by mouth twice daily (Patient taking differently: Take 30 mg by mouth 2 (two) times daily. ) 180 tablet 1  . clonazePAM (KLONOPIN) 0.5 MG tablet Take 0.5-1 tablets (0.25-0.5 mg total) by mouth 2 (two) times daily as needed for anxiety. (Patient taking differently: Take 0.25-0.5 mg by mouth daily as needed for anxiety. ) 20 tablet 1  . diltiazem (CARDIZEM CD) 360 MG 24 hr capsule Take 1 capsule (360 mg total) by mouth daily. 30 capsule 0  . escitalopram (LEXAPRO) 20 MG tablet Take 1 tablet by mouth once daily (Patient taking differently: Take 20 mg by mouth daily. ) 90 tablet 1  . Fluticasone-Umeclidin-Vilant (TRELEGY ELLIPTA) 100-62.5-25 MCG/INH AEPB Inhale 1 puff into the lungs daily. [place]  each 3  . furosemide (LASIX) 20 MG tablet Take 2 tablets (40 mg total) by mouth daily. 30 tablet 0  . ipratropium (ATROVENT HFA) 17 MCG/ACT inhaler Inhale 2 puffs into the lungs 4 (four) times daily. 1 Inhaler 2  . ipratropium-albuterol (DUONEB) 0.5-2.5 (3) MG/3ML SOLN Take 3 mLs by nebulization every 6 (six) hours as needed. (Patient taking differently: Take 3 mLs by nebulization every 6 (six) hours as needed (shortness of breath). ) 360 mL 5  . Magnesium 250 MG TABS Take 250 mg by mouth 2 (two) times daily.     . metoprolol tartrate (LOPRESSOR) 50 MG tablet Take 50 mg by mouth 2 (two) times daily.    Marland Kitchen nystatin (MYCOSTATIN) 100000 UNIT/ML suspension TAKE 5 ML BY MOUTH 4 TIMES DAILY 240 mL 0  . omeprazole (PRILOSEC) 40 MG capsule Take 1 capsule by mouth once daily 90 capsule 0  . polyethylene glycol powder (GLYCOLAX/MIRALAX) powder TAKE 17 GRAMS OF POWDER MIXED IN 8 OUNCES OF WATER BY MOUTH ONCE A DAY. 255 g 3  . potassium chloride SA (KLOR-CON) 20 MEQ tablet Take 1 tablet (20 mEq total) by mouth daily. 30 tablet 3  . pravastatin (PRAVACHOL) 20 MG tablet TAKE 1 TABLET BY MOUTH AT BEDTIME (Patient taking differently: Take 20 mg by mouth daily. ) 90 tablet 1  . PROAIR HFA 108 (90 Base) MCG/ACT inhaler INHALE 2  PUFFS BY MOUTH EVERY 4 HOURS AS NEEDED FOR WHEEZING FOR SHORTNESS OF BREATH (Patient taking differently: Inhale 2 puffs into the lungs every 4 (four) hours as needed for wheezing or shortness of breath. ) 9 g 0  . Respiratory Therapy Supplies (FLUTTER) DEVI 1 each by Does not apply route QID. 1 each 0  . rOPINIRole (REQUIP) 3 MG tablet TAKE 1 TABLET BY MOUTH AT BEDTIME 90 tablet 2  . sucralfate (CARAFATE) 1 g tablet Take 1 g by mouth 3 (three) times daily. DISSOLVE IN THREE TO FOUR TABLESPOONFULS OF WARM WARM SWISH AND SWALLOW    . traZODone (DESYREL) 50 MG tablet Take 1 tablet (50 mg total) by mouth at bedtime. 30 tablet 1   No current facility-administered medications for this visit.    PHYSICAL EXAMINATION: ECOG PERFORMANCE STATUS: 1 - Symptomatic but completely ambulatory  BP (!) 88/58 (BP Location: Right Arm, Patient Position: Sitting, Cuff Size: Large)   Pulse (!) 103   Temp 98.1 F (36.7 C) (Tympanic)   Wt 277 lb (125.6 kg)   SpO2 93% Comment: 2 liters o2  BMI 39.75 kg/m   Filed Weights   08/10/19 1041  Weight: 277 lb (125.6 kg)   Physical Exam  Constitutional: She is oriented to person, place, and time and well-developed, well-nourished, and in no distress.  Patient in wheelchair.  On 2.5 of oxygen.  Accompanied by daughter.  She feels tired.  HENT:  Head: Normocephalic and atraumatic.  Mouth/Throat: Oropharynx is clear and moist. No oropharyngeal exudate.  Eyes: Pupils are equal, round, and reactive to light.  Cardiovascular: Normal rate and regular rhythm.  Pulmonary/Chest: No respiratory distress. She has no wheezes.  Decreased air entry bilaterally.  Abdominal: Soft. Bowel sounds are normal. She exhibits no distension and no mass. There is no abdominal tenderness. There is no rebound and no guarding.  Musculoskeletal:        General: Edema present. No tenderness. Normal range of motion.     Cervical back: Normal range of motion and neck supple.  Neurological: She is  alert and oriented to person,  place, and time.  Skin: Skin is warm.  Psychiatric: Affect normal.   LABORATORY DATA:  I have reviewed the data as listed    Component Value Date/Time   NA 132 (L) 08/09/2019 0622   NA 133 (L) 06/23/2019 1601   K 4.0 08/09/2019 0622   CL 93 (L) 08/09/2019 0622   CO2 28 08/09/2019 0622   GLUCOSE 109 (H) 08/09/2019 0622   BUN 18 08/09/2019 0622   BUN 22 06/23/2019 1601   CREATININE 0.70 08/09/2019 0622   CALCIUM 8.8 (L) 08/09/2019 0622   PROT 6.4 (L) 08/06/2019 1139   PROT 6.7 05/01/2018 1116   ALBUMIN 2.6 (L) 08/06/2019 1139   ALBUMIN 4.2 05/01/2018 1116   AST 24 08/06/2019 1139   ALT 47 (H) 08/06/2019 1139   ALKPHOS 67 08/06/2019 1139   BILITOT 0.7 08/06/2019 1139   BILITOT 0.3 05/01/2018 1116   GFRNONAA >60 08/09/2019 0622   GFRAA >60 08/09/2019 0622    No results found for: SPEP, UPEP  Lab Results  Component Value Date   WBC 11.4 (H) 08/09/2019   NEUTROABS 9.5 (H) 07/23/2019   HGB 11.5 (L) 08/09/2019   HCT 36.2 08/09/2019   MCV 87.0 08/09/2019   PLT 319 08/09/2019      Chemistry      Component Value Date/Time   NA 132 (L) 08/09/2019 0622   NA 133 (L) 06/23/2019 1601   K 4.0 08/09/2019 0622   CL 93 (L) 08/09/2019 0622   CO2 28 08/09/2019 0622   BUN 18 08/09/2019 0622   BUN 22 06/23/2019 1601   CREATININE 0.70 08/09/2019 0622      Component Value Date/Time   CALCIUM 8.8 (L) 08/09/2019 0622   ALKPHOS 67 08/06/2019 1139   AST 24 08/06/2019 1139   ALT 47 (H) 08/06/2019 1139   BILITOT 0.7 08/06/2019 1139   BILITOT 0.3 05/01/2018 1116       RADIOGRAPHIC STUDIES: I have personally reviewed the radiological images as listed and agreed with the findings in the report. No results found.   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  #I had a long discussion the patient and daughters regarding the aggressive nature of relapse.  Even with chemotherapy patient would be incurable; with median life expectancy anywhere between 8 to 12 months even with chemotherapy.  #Given patient's performance status ECOG 3-4; cardiac issues severe COPD/O2 dependent-patient with very very high risk of side effects/adverse events from systemic therapy.  Patient high risk for admission to hospital and death from complications.  #Other injury discussion patient and family interested in-quality of life rather than quantity of life.  Interested in the supportive care.  Discussed the hospice philosophy.  They are interested in home hospice.  Hospice referral made  #Insomnia-recommend trazodone.  Prescription sent.  #Blood pressure systolic 55D; recommend decrease Lasix to 20 mg a day; and discontinue Lopressor.  #I spoke to Debera Lat palliative care NP; kindly agrees to evaluate the patient sooner this week.   ## DISPOSITION: # referral to hospice # follow up as needed-Dr.B  Cc; Otila Kluver.     Orders Placed This Encounter  Procedures  . Oxygen therapy Mode or (Route): Nasal cannula; Liters Per Minute: 2; Keep 02 saturation: greater than 90% while awake, greater than 85% while asleep.    If high flow oxygen  is used patient should wear a surgical mask and side gaps should be minimized.    Standing Status:   Standing    Number of Occurrences:   1    Order Specific Question:   Mode or (Route)    Answer:   Nasal cannula    Order Specific Question:   Liters Per Minute    Answer:   2    Order Specific Question:   Keep 02 saturation    Answer:   greater than 90% while awake, greater than 85% while asleep.   All questions were answered. The patient knows to call the clinic with any problems, questions or concerns.      Cammie Sickle, MD 08/10/2019 2:01 PM

## 2019-08-10 NOTE — Telephone Encounter (Signed)
At the request of the NP she requested that I contact the daughter to see if she could make an In-person visit with patient this Fri @ 9:30 AM, no answer - left message with my contact information requesting a call back.  I also called patient regarding above with no answer - left message requesting a call back .

## 2019-08-10 NOTE — Telephone Encounter (Signed)
please inform daughter that her blood pressures were on the lower side at the office visit. Hence I would recommend stopping metoprolol. And recommend taking Lasix 20 mg [1 pill a day]; keep legs elevated.Marland Kitchen

## 2019-08-10 NOTE — Telephone Encounter (Signed)
Spoke with Gae Bon (daughter) who states pt is currently at another apt but she will be available this afternoon. Will try back then.

## 2019-08-10 NOTE — Telephone Encounter (Signed)
Per Dr. Rogue Bussing - contacted daughter, [name], health care power of attorney. Left vm for daughter to return my phone call.   Also attempted to reach patient to discuss her medications. Left msg- requesting patient to return my phone call.

## 2019-08-10 NOTE — Telephone Encounter (Signed)
Virtual HFU scheduled 08/13/19.

## 2019-08-10 NOTE — Telephone Encounter (Signed)
I called [name], Ms. [place]ium after speaking with Dr Rogue Bussing to move [facility] appointment up as an urgent need. No answer, message left with contact information.

## 2019-08-10 NOTE — Telephone Encounter (Signed)
I have made the 2nd attempt to contact the patient or family member in charge, in order to follow up from recently being discharged from the hospital. I left a message on voicemail requesting a CB. -MM  

## 2019-08-10 NOTE — Telephone Encounter (Signed)
[id number]- spoke with patient's daughter. Daughter read back instructions to hold metoprolol and to start Lasix 20 mg daily.

## 2019-08-11 ENCOUNTER — Telehealth: Payer: Self-pay | Admitting: Nurse Practitioner

## 2019-08-11 ENCOUNTER — Telehealth: Payer: Self-pay | Admitting: Hospice and Palliative Medicine

## 2019-08-11 ENCOUNTER — Telehealth: Payer: Self-pay | Admitting: Internal Medicine

## 2019-08-11 LAB — CULTURE, BLOOD (ROUTINE X 2)
Culture: NO GROWTH
Culture: NO GROWTH

## 2019-08-11 NOTE — Telephone Encounter (Signed)
Transition Care Management Follow-up Telephone Call  Date of discharge and from where: [facility] on 08/09/19  How have you been since you were released from the hospital? Doing well. Legs and feet are still swollen but the swelling has gone down some. Pt has not kept her feet elevated as much but she plans to today. Dr Rogue Bussing took pt off Vit C and Metoprolol yesterday. B/p was 140/86 and pulse was 115 this AM. Pt feels she has more energy today. O2 was 91% and pt has not had to wear her oxygen. Declines pain, SOB, fever, redness, weakness or n/v/d.   Any questions or concerns? Pt has not been wearing her oxygen due to having good O2 readings and not having trouble breathing. Pt would like to know if that is ok if she is not using the oxygen unless needed?  Items Reviewed:  Did the pt receive and understand the discharge instructions provided? Yes   Medications obtained and verified? No, pt verified with Dr Rogue Bussing yesterday. Up to date on file.   Any new allergies since your discharge? No   Dietary orders reviewed? Yes  Do you have support at home? Yes   Other (ie: DME, Home Health, etc):  Pt was referred to hospice. Pt is currently waiting for a CB from [employer].   Functional Questionnaire: (I = Independent and D = Dependent)  Bathing/Dressing- I but has assistance due to SOB and low energy.    Meal Prep- D  Eating- I  Maintaining continence- I  Transferring/Ambulation- D, uses a rollator for assistance walking.  Managing Meds- D, daughters manage medications.   Follow up appointments reviewed:    PCP Hospital f/u appt confirmed? Yes  Scheduled for a virtual visit with Fenton Malling on 08/13/19 @ 2:40 PM.  [facility] f/u appt confirmed? Yes , had a f/u with Dr Rogue Bussing yesterday, 08/10/19.  Are transportation arrangements needed? No   If their condition worsens, is the pt aware to call  their PCP or go to the ED? Yes  Was the  patient provided with contact information for the PCP's office or ED? Yes  Was the pt encouraged to call back with questions or concerns? Yes

## 2019-08-11 NOTE — Telephone Encounter (Signed)
Dr. Patsey Berthold please advise,  Pts daughter [name] (dpr) called because pt has appt w/ [facility] on 4/20. Ms. Alberta is going into hospice, and wanted to know if she would still need to be seen at our office or if hospice could take over her pulmonary care. She has stage 4 lung cancer.

## 2019-08-11 NOTE — Telephone Encounter (Signed)
At the request of the Palliative NP, called daughter and left a message to let her know that we are cancelling the appointment that was scheduled for 08/13/19 due to [facility] was sending in a Hospice referral for patient today.

## 2019-08-11 NOTE — Telephone Encounter (Signed)
Spoke with patient's daughter, [name], by phone this morning.  Patient and family saw Dr. Rogue Bussing yesterday in the clinic and decided to forego future treatment with a focus on comfort at home.  Discussed option of hospice with daughter.  She would like to proceed with hospice care for her mother at home.  I called in a an order for hospice to [facility].   We also discussed CODE STATUS.  Daughter says that patient would not want to be resuscitated or have her life prolonged artificially machines.  Patient and family would want her to be a DNR.  We will ask that hospice help coordinate getting her a DNR order in the home.

## 2019-08-12 ENCOUNTER — Telehealth: Payer: Self-pay | Admitting: Internal Medicine

## 2019-08-12 NOTE — Telephone Encounter (Signed)
On 4/06-spoke to patient's daughter [name]; patient doing better since adjusting blood pressure medications.  Hospice arriving on 4/08.  Reiterated the daughter the decision to forego chemotherapy is very reasonable given patient's preference/overall poor prognosis.  Very thankful for the care provided.   H/T/Ms.Burnette-FYI

## 2019-08-12 NOTE — Telephone Encounter (Signed)
She can cancel that appointment.  I have seen Ms. Lame during her most recent admission at the hospital.  I have been in contact with Dr. Rogue Bussing.  Agree with hospice care.  No need for further follow-up unless absolutely necessary.  Renold Don, MD [place] [facility]

## 2019-08-13 ENCOUNTER — Ambulatory Visit (INDEPENDENT_AMBULATORY_CARE_PROVIDER_SITE_OTHER): Payer: Medicare Other | Admitting: Physician Assistant

## 2019-08-13 ENCOUNTER — Other Ambulatory Visit: Payer: Self-pay | Admitting: Nurse Practitioner

## 2019-08-13 ENCOUNTER — Other Ambulatory Visit: Payer: Self-pay

## 2019-08-13 ENCOUNTER — Encounter: Payer: Self-pay | Admitting: Physician Assistant

## 2019-08-13 ENCOUNTER — Other Ambulatory Visit: Payer: Self-pay | Admitting: *Deleted

## 2019-08-13 ENCOUNTER — Telehealth: Payer: Medicare Other | Admitting: Physician Assistant

## 2019-08-13 VITALS — BP 180/111 | HR 124 | Wt 240.0 lb

## 2019-08-13 DIAGNOSIS — B370 Candidal stomatitis: Secondary | ICD-10-CM | POA: Diagnosis not present

## 2019-08-13 DIAGNOSIS — C3402 Malignant neoplasm of left main bronchus: Secondary | ICD-10-CM | POA: Diagnosis not present

## 2019-08-13 DIAGNOSIS — J9611 Chronic respiratory failure with hypoxia: Secondary | ICD-10-CM | POA: Diagnosis not present

## 2019-08-13 DIAGNOSIS — Z7689 Persons encountering health services in other specified circumstances: Secondary | ICD-10-CM | POA: Diagnosis not present

## 2019-08-13 MED ORDER — MORPHINE SULFATE (CONCENTRATE) 20 MG/ML PO SOLN: ORAL | 0 refills | Status: AC

## 2019-08-13 NOTE — Progress Notes (Signed)
Patient: Cathy Watts Female    DOB: 1945/10/31   74 y.o.   MRN: [id number] Visit Date: 08/13/2019  Today's Provider: Mar Daring, PA-C   Chief Complaint  Patient presents with  . Follow-up   Subjective:     Virtual Visit via Telephone Note  I connected with Cathy Watts on 08/13/19 at  3:00 PM EDT by telephone and verified that I am speaking with the correct person using two identifiers.  Location: Patient: Home Provider: [facility]   I discussed the limitations, risks, security and privacy concerns of performing an evaluation and management service by telephone and the availability of in person appointments. I also discussed with the patient that there may be a patient responsible charge related to this service. The patient expressed understanding and agreed to proceed.   HPI   Follow up Hospitalization  Patient was admitted to [facility] on 08/06/19 and discharged on 08/09/2019 She was treated for Bilateral lower lower extremity swelling secondary to hypoalbuminemia/ third spacing,Malignant neoplasm left lung,Acute on chronic respiratory failure secondary to progressive disease in the lung with underlying baseline severe COPD/emphysema Treatment for this included CT scan of the chest done which showedinterval progression of diseasewith increase in size of left perihilar mass with progressive invasion of the left hilum and mediastinum.New left supraclavicular and right paratracheal adenopathy. New small left pleural effusion. Dr. Rogue Bussing has set up patient to start chemotherapy starting 6th April. Patient, however, has now decided to forgo any treatment as it is for palliative measures only and she has started Hospice care just today.  Continue oxygen supplementation at 2 L.  -Continue as needed bronchodilator therapy and inhaled steroids  increasedLasix 40 mg IV BID-- oral Lasix 40 daily  -patient advised keep legs elevated Telephone follow up was done on  08/10/19 She reports excellent compliance with treatment. She reports this condition is Improved.  ------------------------------------------------------------------------------------    Allergies  Allergen Reactions  . Amoxicillin Hives and Itching    Did it involve swelling of the face/tongue/throat, SOB, or low BP? No Did it involve sudden or severe rash/hives, skin peeling, or any reaction on the inside of your mouth or nose? No Did you need to seek medical attention at a hospital or doctor's office? No When did it last happen?2018 If all above answers are "NO", may proceed with cephalosporin use.     . Chlorhexidine Dermatitis    Patient refused     Current Outpatient Medications:  .  acetaminophen (TYLENOL) 325 MG tablet, Take 2 tablets (650 mg total) by mouth every 6 (six) hours as needed for mild pain or headache (fever >/= 101)., Disp:  , Rfl:  .  apixaban (ELIQUIS) 5 MG TABS tablet, Take 1 tablet (5 mg total) by mouth 2 (two) times daily., Disp: 60 tablet, Rfl: 1 .  busPIRone (BUSPAR) 30 MG tablet, Take 1 tablet by mouth twice daily (Patient taking differently: Take 30 mg by mouth 2 (two) times daily. ), Disp: 180 tablet, Rfl: 1 .  clonazePAM (KLONOPIN) 0.5 MG tablet, Take 0.5-1 tablets (0.25-0.5 mg total) by mouth 2 (two) times daily as needed for anxiety. (Patient taking differently: Take 0.25-0.5 mg by mouth daily as needed for anxiety. ), Disp: 20 tablet, Rfl: 1 .  diltiazem (CARDIZEM CD) 360 MG 24 hr capsule, Take 1 capsule (360 mg total) by mouth daily., Disp: 30 capsule, Rfl: 0 .  escitalopram (LEXAPRO) 20 MG tablet, Take 1 tablet by mouth once daily (Patient  taking differently: Take 20 mg by mouth daily. ), Disp: 90 tablet, Rfl: 1 .  Fluticasone-Umeclidin-Vilant (TRELEGY ELLIPTA) 100-62.5-25 MCG/INH AEPB, Inhale 1 puff into the lungs daily., Disp: 180 each, Rfl: 3 .  furosemide (LASIX) 20 MG tablet, Take 2 tablets (40 mg total) by mouth daily. (Patient taking  differently: Take 20 mg by mouth daily. ), Disp: 30 tablet, Rfl: 0 .  ipratropium-albuterol (DUONEB) 0.5-2.5 (3) MG/3ML SOLN, Take 3 mLs by nebulization every 6 (six) hours as needed. (Patient taking differently: Take 3 mLs by nebulization every 6 (six) hours as needed (shortness of breath). ), Disp: 360 mL, Rfl: 5 .  Magnesium 250 MG TABS, Take 250 mg by mouth 2 (two) times daily. , Disp: , Rfl:  .  morphine (ROXANOL) 20 MG/ML concentrated solution, 0.25 - 1 ml (5-20 mg) every 1 -2 hours as needed for pain or shortness of breath, Disp: 30 mL, Rfl: 0 .  nystatin (MYCOSTATIN) 100000 UNIT/ML suspension, TAKE 5 ML BY MOUTH 4 TIMES DAILY, Disp: 240 mL, Rfl: 0 .  omeprazole (PRILOSEC) 40 MG capsule, Take 1 capsule by mouth once daily, Disp: 90 capsule, Rfl: 0 .  polyethylene glycol powder (GLYCOLAX/MIRALAX) powder, TAKE 17 GRAMS OF POWDER MIXED IN 8 OUNCES OF WATER BY MOUTH ONCE A DAY., Disp: 255 g, Rfl: 3 .  potassium chloride SA (KLOR-CON) 20 MEQ tablet, Take 1 tablet (20 mEq total) by mouth daily., Disp: 30 tablet, Rfl: 3 .  PROAIR HFA 108 (90 Base) MCG/ACT inhaler, INHALE 2 PUFFS BY MOUTH EVERY 4 HOURS AS NEEDED FOR WHEEZING FOR SHORTNESS OF BREATH (Patient taking differently: Inhale 2 puffs into the lungs every 4 (four) hours as needed for wheezing or shortness of breath. ), Disp: 9 g, Rfl: 0 .  Respiratory Therapy Supplies (FLUTTER) DEVI, 1 each by Does not apply route QID., Disp: 1 each, Rfl: 0 .  rOPINIRole (REQUIP) 3 MG tablet, TAKE 1 TABLET BY MOUTH AT BEDTIME, Disp: 90 tablet, Rfl: 2 .  sucralfate (CARAFATE) 1 g tablet, Take 1 g by mouth 3 (three) times daily. DISSOLVE IN THREE TO FOUR TABLESPOONFULS OF WARM WARM SWISH AND SWALLOW, Disp: , Rfl:  .  traZODone (DESYREL) 50 MG tablet, Take 1 tablet (50 mg total) by mouth at bedtime., Disp: 30 tablet, Rfl: 1 .  ipratropium (ATROVENT HFA) 17 MCG/ACT inhaler, Inhale 2 puffs into the lungs 4 (four) times daily. (Patient not taking: Reported on 08/13/2019),  Disp: 1 Inhaler, Rfl: 2 .  metoprolol tartrate (LOPRESSOR) 50 MG tablet, Take 50 mg by mouth 2 (two) times daily., Disp: , Rfl:  .  pravastatin (PRAVACHOL) 20 MG tablet, TAKE 1 TABLET BY MOUTH AT BEDTIME (Patient not taking: No sig reported), Disp: 90 tablet, Rfl: 1  Review of Systems  Constitutional: Positive for activity change.  Respiratory: Positive for cough and shortness of breath.   Cardiovascular: Positive for leg swelling.  Psychiatric/Behavioral: Negative.     Social History   Tobacco Use  . Smoking status: Former Smoker    Packs/day: 1.00    Years: 55.00    Pack years: 55.00    Types: Cigarettes    Quit date: 11/02/2017    Years since quitting: 1.7  . Smokeless tobacco: Never Used  Substance Use Topics  . Alcohol use: Yes    Alcohol/week: 0.0 standard drinks    Comment: occasionally- wine      Objective:   BP (!) 180/111 (BP Location: Right Arm, Patient Position: Sitting, Cuff Size: Large)  Pulse (!) 124   Wt 240 lb (108.9 kg)   SpO2 96%   BMI 34.44 kg/m  Vitals:   08/13/19 1507  BP: (!) 180/111  Pulse: (!) 124  SpO2: 96%  Weight: 240 lb (108.9 kg)  Body mass index is 34.44 kg/m.   Physical Exam Vitals reviewed.  Constitutional:      General: She is not in acute distress. Pulmonary:     Effort: Pulmonary effort is normal. No respiratory distress.  Neurological:     Mental Status: She is alert.      No results found for any visits on 08/13/19.     Assessment & Plan     1. Encounter for support and coordination of transition of care All notes, consults, labs and imaging were reviewed by me prior to appt today regarding hospitalization from 08/06/19 - 08/10/19. Telephone follow up was done on 08/10/19. She has decided to forgo any further treatment options for her cancer at this time and has elected to start Hospice care. Hospice care just started today 08/13/19. Will assist in any ways necessary.   2. Malignant neoplasm of hilus of left lung  ([place]) See above medical treatment plan.  3. Chronic respiratory failure with hypoxia ([place]) See above medical treatment plan.  4. Thrush From inhalers and steroids. Continue nystatin mouth suspension as needed.    I discussed the assessment and treatment plan with the patient. The patient was provided an opportunity to ask questions and all were answered. The patient agreed with the plan and demonstrated an understanding of the instructions.   The patient was advised to call back or seek an in-person evaluation if the symptoms worsen or if the condition fails to improve as anticipated.  I provided 15 minutes of non-face-to-face time during this encounter.    Mar Daring, PA-C  [facility]

## 2019-08-13 NOTE — Telephone Encounter (Signed)
Patient scheduled with Dr. Mortimer Fries. Nothing further needed

## 2019-08-16 NOTE — Telephone Encounter (Signed)
I called and spoke with patient's daughter and made her aware that Dr. Patsey Berthold states that she can cancel the appointment for 08/24/19. I advised her that I will do this. Nothing further is needed.

## 2019-08-17 ENCOUNTER — Encounter: Payer: Self-pay | Admitting: Physician Assistant

## 2019-08-17 ENCOUNTER — Other Ambulatory Visit: Payer: Self-pay | Admitting: Nurse Practitioner

## 2019-08-18 ENCOUNTER — Telehealth: Payer: Self-pay | Admitting: Hospice and Palliative Medicine

## 2019-08-18 NOTE — Telephone Encounter (Signed)
I received a call from patient's hospice nurse.  Reportedly, patient is declining.  She is less mobile and having more pain.  Discussed titration of her as needed morphine as needed.  Patient also having urinary frequency but is struggling to get to the bathroom.  Okay to place Foley and check UA.

## 2019-08-19 ENCOUNTER — Telehealth: Payer: Self-pay | Admitting: *Deleted

## 2019-08-23 ENCOUNTER — Telehealth: Payer: Self-pay | Admitting: Internal Medicine

## 2019-08-23 NOTE — Telephone Encounter (Signed)
On 4/16- I called patient's daughter and offered my condolences.  Family very thankful for the call. Appreciate the cancer center staff for the care and support provided to the patient.  GB

## 2019-08-24 ENCOUNTER — Ambulatory Visit: Payer: Medicare Other | Admitting: Internal Medicine

## 2019-09-04 NOTE — Telephone Encounter (Signed)
[facility] nurse called reporting that EMS was called this morning and patient was pronounced deceased at 8:45 AM today

## 2019-09-04 DEATH — deceased

## 2020-04-13 IMAGING — CR DG CHEST 2V
1 series · 2 of 2 positions shown · non-contrast
Comparison: Chest CT 04/12/2019 and earlier. Most recent chest
radiographs 04/25/2018.

CLINICAL DATA: 73-year-old female with shortness of breath and
cough for 2 weeks. History of bilateral lung cancer, more recently
on the left side last year.

EXAM:
CHEST - 2 VIEW

[Series 1: dg chest 2 view · 0.14mm/px · 2 of 2 slices shown]
[im 1/2]
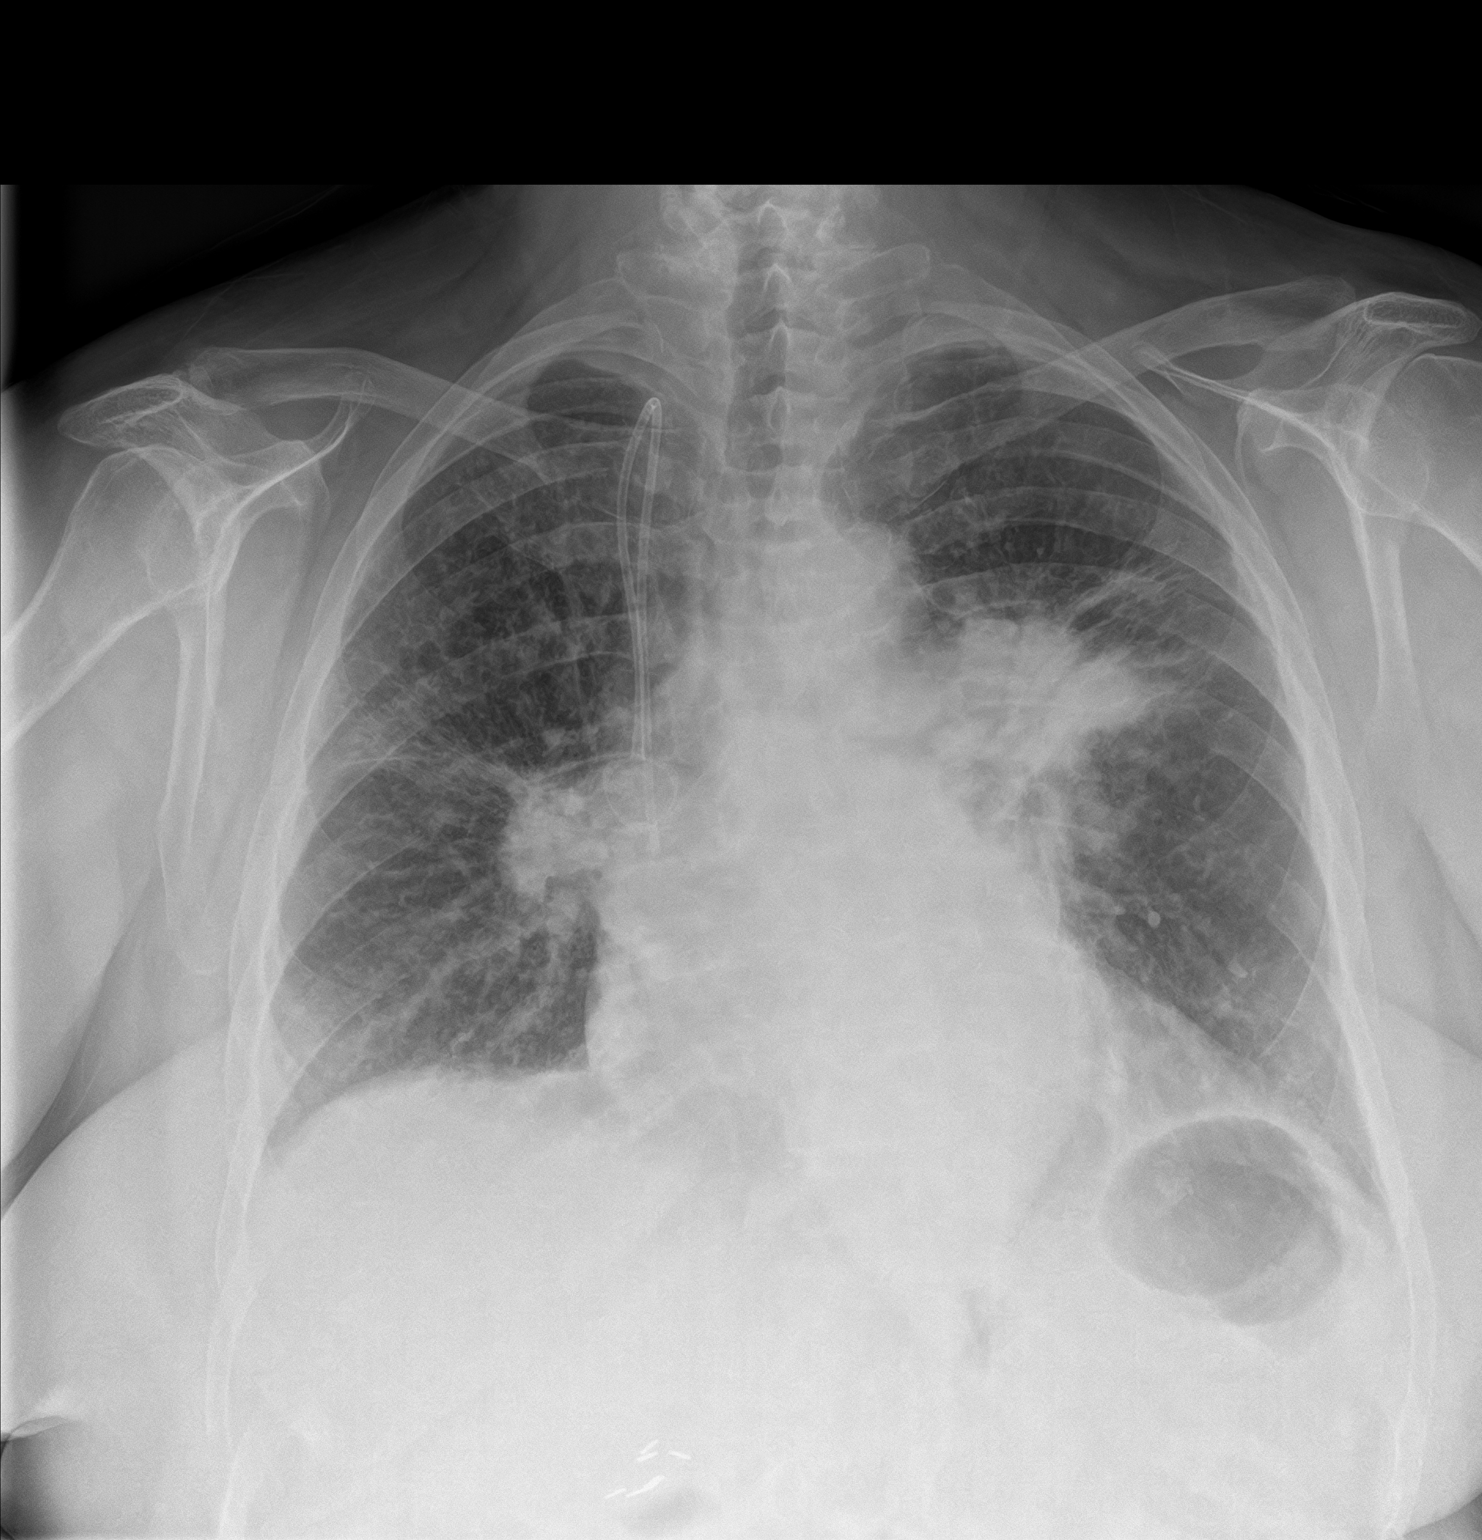
[im 2/2]
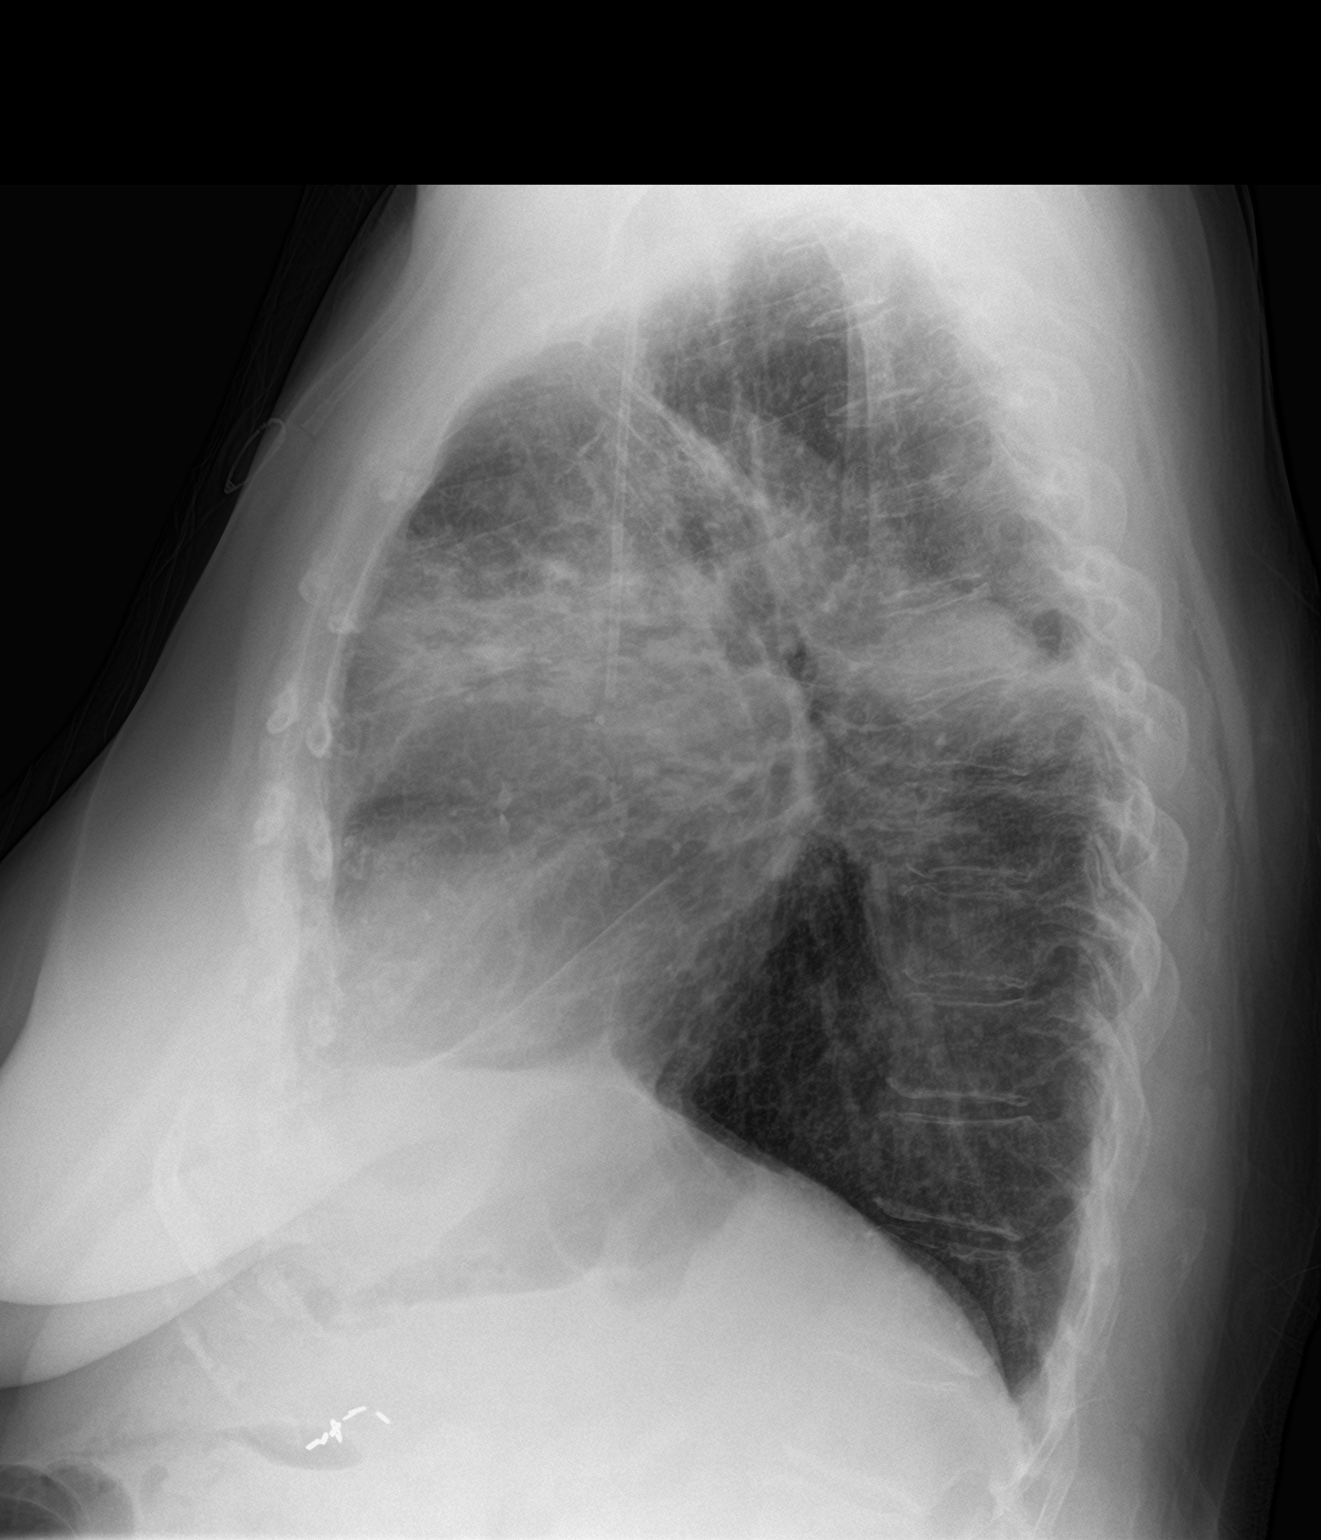

[2 of 2 positions shown; findings below may reference images not displayed]

FINDINGS: Both hilar contours are abnormal. That on the right appears stable
since 3132. That on the left appears grossly stable from the
[facility] CT when left perihilar radiation changes were suspected.

Lower lung volumes. No cardiomegaly. Right chest porta cath in
place. No pneumothorax, pulmonary edema or pleural effusion.

Negative visible bowel gas pattern. Stable cholecystectomy clips. No
acute osseous abnormality identified.
IMPRESSION: 1. Lower lung volumes, but otherwise stable chest when compared to
the [facility] CT. Perihilar post radiation changes suspected.
2. No new cardiopulmonary abnormality identified.

## 2020-05-10 IMAGING — MR MR HEAD WO/W CM
15 series · 48 of 48 positions shown · IV contrast (gadavist)
Comparison: Noncontrast head CTs 06/27/2017 and earlier.

CLINICAL DATA: 73-year-old female with right lung cancer, non-small
cell. Suspicion of disease progression in the chest recently.
Staging.

EXAM:
MRI HEAD WITHOUT AND WITH CONTRAST
TECHNIQUE: Multiplanar, multiecho pulse sequences of the brain and surrounding
structures were obtained without and with intravenous contrast.
CONTRAST:  10mL GADAVIST GADOBUTROL 1 MMOL/ML IV SOLN

[Series 3: ax dwi_tracew · axial · 3.0mm · 1.31mm/px · z∈[-59,+99]mm · 4 of 49 slices shown]
[im 1/49]
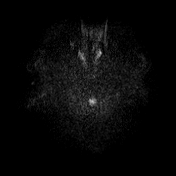
[im 17/49]
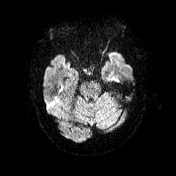
[im 33/49]
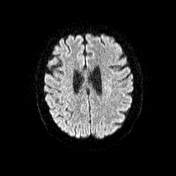
[im 49/49]
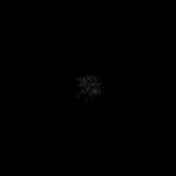

[Series 4: ax dwi_adc · axial · 3.0mm · 1.31mm/px · z∈[-59,+99]mm · 5 of 49 slices shown]
[im 1/49]
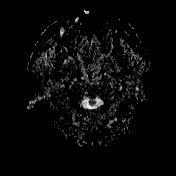
[im 13/49]
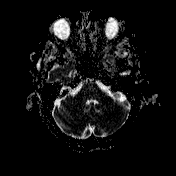
[im 25/49]
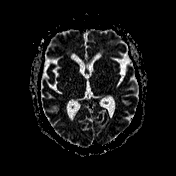
[im 37/49]
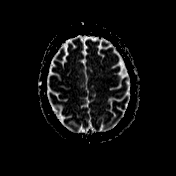
[im 49/49]
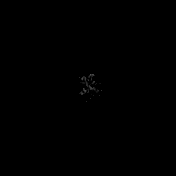

[Series 5: cor dwi_tracew · coronal · 5.0mm · 1.31mm/px · 4 of 38 slices shown]
[im 1/38]
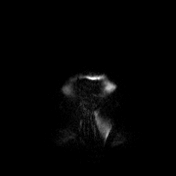
[im 13/38]
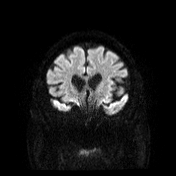
[im 25/38]
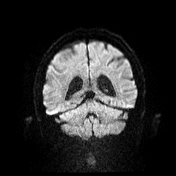
[im 38/38]
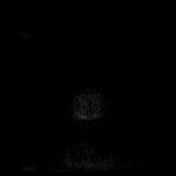

[Series 6: cor dwi_adc · coronal · 5.0mm · 1.31mm/px · 4 of 38 slices shown]
[im 1/38]
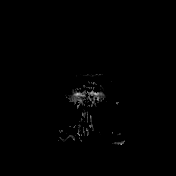
[im 13/38]
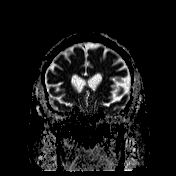
[im 25/38]
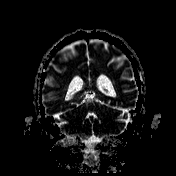
[im 38/38]
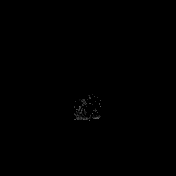

[Series 7: T1 · sagittal · 5.0mm · 0.94mm/px · 2 of 23 slices shown (1 of 4)]
[im 1/23]
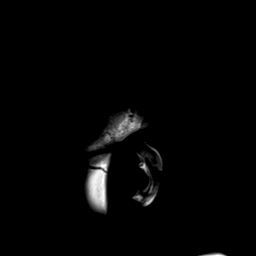
[im 23/23]
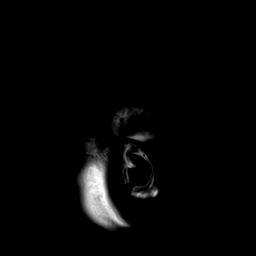

[Series 8: T2 · axial · 5.0mm · 0.45mm/px · z∈[-56,+100]mm · 3 of 27 slices shown (1 of 2)]
[im 1/27]
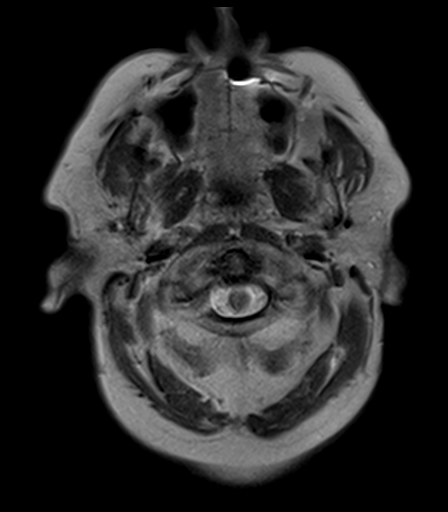
[im 14/27]
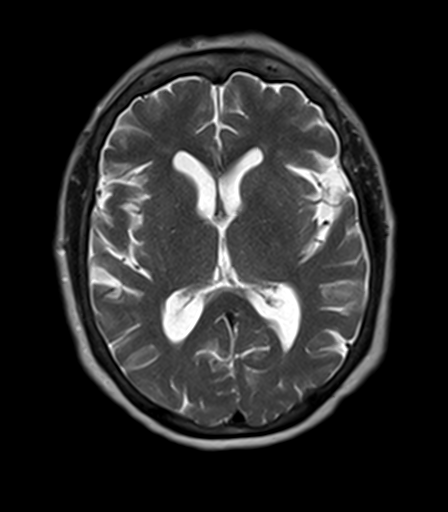
[im 27/27]
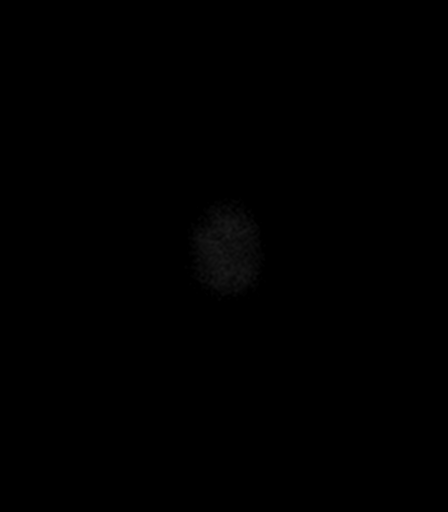

[Series 9: T2-star · axial · 5.0mm · 0.45mm/px · z∈[-56,+100]mm · 3 of 27 slices shown]
[im 1/27]
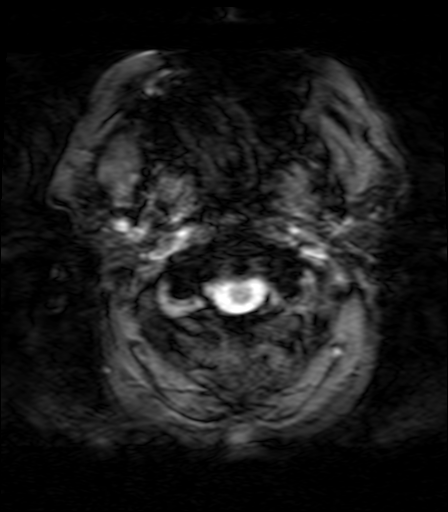
[im 14/27]
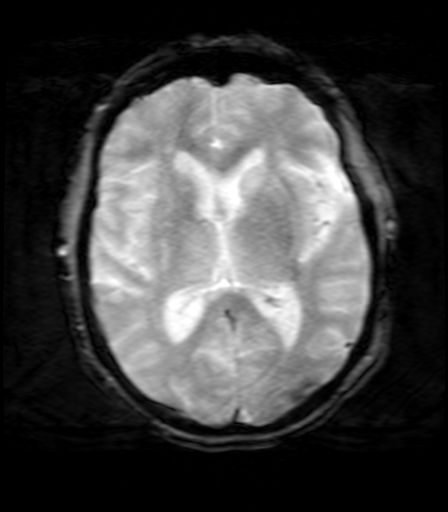
[im 27/27]
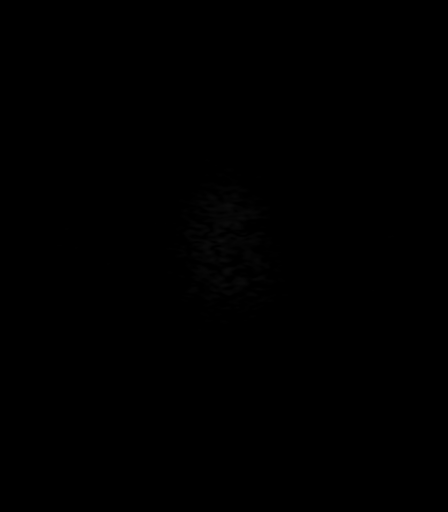

[Series 10: FLAIR · axial · 5.0mm · 1.20mm/px · z∈[-56,+99]mm · 3 of 27 slices shown]
[im 1/27]
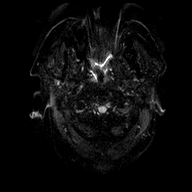
[im 14/27]
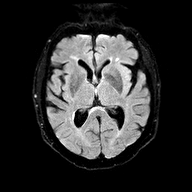
[im 27/27]
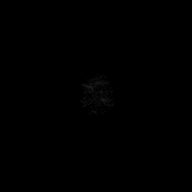

[Series 11: T1 · axial · 5.0mm · 0.90mm/px · z∈[-56,+100]mm · 3 of 27 slices shown (2 of 4)]
[im 1/27]
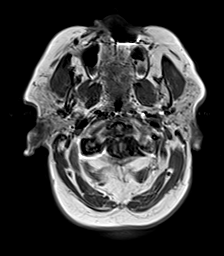
[im 14/27]
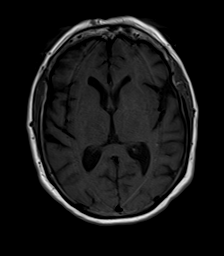
[im 27/27]
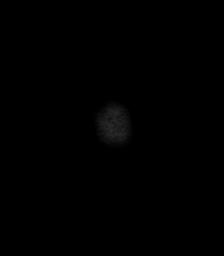

[Series 12: T1 · sagittal · 5.0mm · 0.94mm/px · 2 of 23 slices shown (3 of 4)]
[im 1/23]
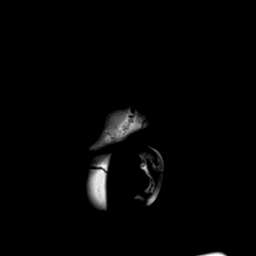
[im 23/23]
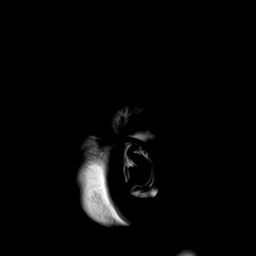

[Series 13: T1 · axial · 5.0mm · 0.90mm/px · z∈[-56,+100]mm · 3 of 27 slices shown (4 of 4)]
[im 1/27]
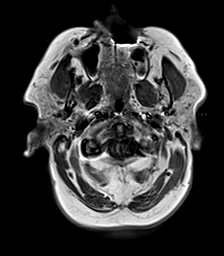
[im 14/27]
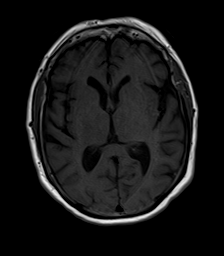
[im 27/27]
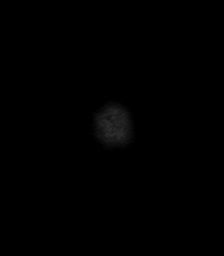

[Series 14: T2 · coronal · 5.0mm · 0.45mm/px · 3 of 31 slices shown (2 of 2)]
[im 1/31]
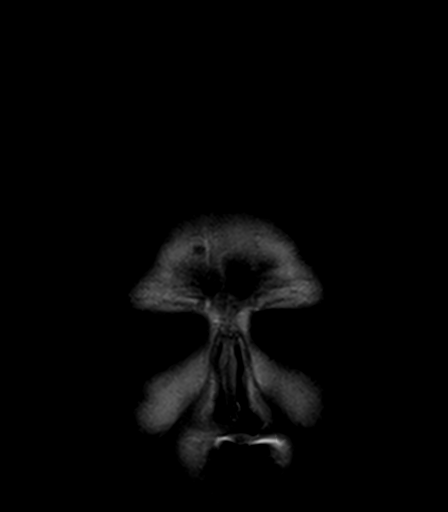
[im 16/31]
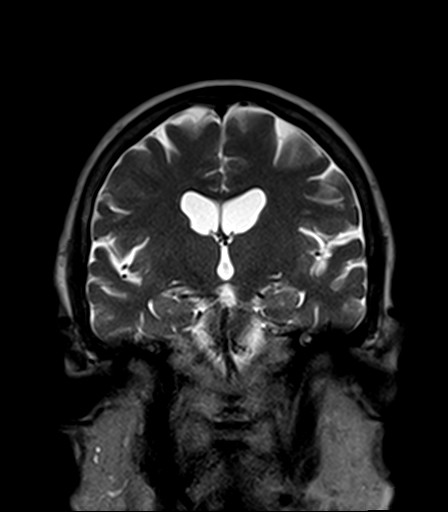
[im 31/31]
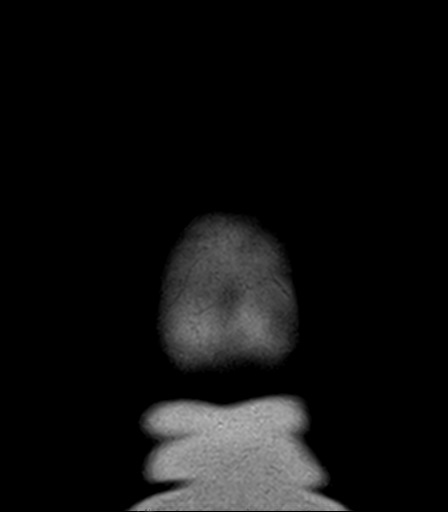

[Series 15: T1 post-contrast · axial · 5.0mm · 0.90mm/px · z∈[-56,+100]mm · 3 of 27 slices shown (1 of 3)]
[im 1/27]
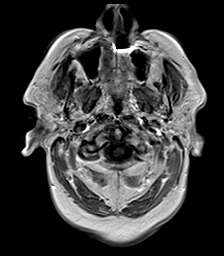
[im 14/27]
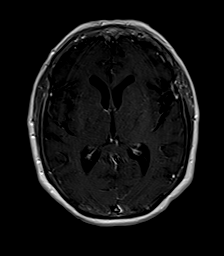
[im 27/27]
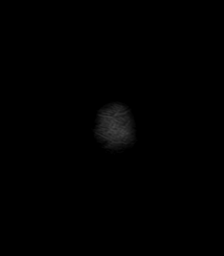

[Series 16: T1 post-contrast · coronal · 5.0mm · 0.90mm/px · 3 of 31 slices shown (2 of 3)]
[im 1/31]
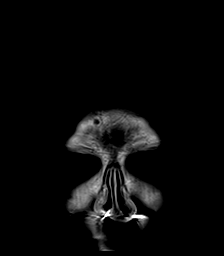
[im 16/31]
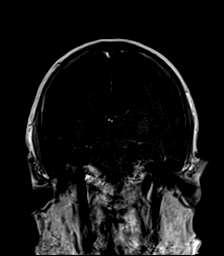
[im 31/31]
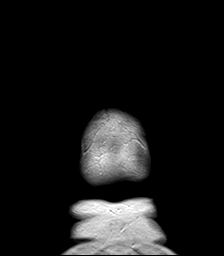

[Series 17: T1 post-contrast · axial · 5.0mm · 0.90mm/px · z∈[-56,+100]mm · 3 of 27 slices shown (3 of 3)]
[im 1/27]
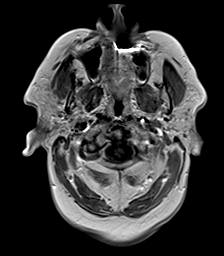
[im 14/27]
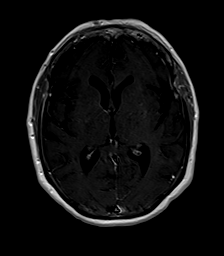
[im 27/27]
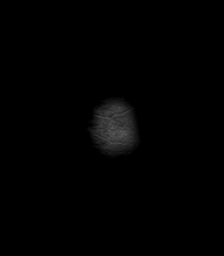

[48 of 48 positions shown; findings below may reference images not displayed]

FINDINGS: Brain: No restricted diffusion to suggest acute infarction. No
midline shift, mass effect, evidence of mass lesion,
ventriculomegaly, extra-axial collection or acute intracranial
hemorrhage. Cervicomedullary junction and pituitary are within
normal limits.

No abnormal enhancement identified. There is motion artifact on the
postcontrast images. No dural thickening.

Scattered nonspecific cerebral white matter T2 and FLAIR
hyperintensity, mild to moderate for age. None resembles vasogenic
edema. Similar patchy T2 hyperintensity in the pons. No cortical
encephalomalacia or chronic cerebral blood products identified. The
deep gray nuclei and cerebellum appear negative.

Vascular: Major intracranial vascular flow voids are preserved.

Skull and upper cervical spine: Visualized bone marrow signal is
within normal limits.

Sinuses/Orbits: Postoperative changes to both globes, otherwise
negative orbits. Paranasal sinuses are clear.

Other: Mild bilateral mastoid effusions. Visible internal auditory
structures appear normal. Scalp and face soft tissues appear
negative.
IMPRESSION: 1. No metastatic disease or acute intracranial abnormality
identified.
2. Mild to moderate for age signal changes in the cerebral white
matter and pons, most commonly due to chronic small vessel disease.
